# Patient Record
Sex: Male | Born: 1970
Health system: Southern US, Community
[De-identification: ages and names within clinical notes are randomized; demographics above are authoritative.]

## PROBLEM LIST (undated history)

## (undated) DIAGNOSIS — R0600 Dyspnea, unspecified: Secondary | ICD-10-CM

## (undated) DIAGNOSIS — D869 Sarcoidosis, unspecified: Secondary | ICD-10-CM

## (undated) DIAGNOSIS — J45909 Unspecified asthma, uncomplicated: Secondary | ICD-10-CM

## (undated) DIAGNOSIS — E119 Type 2 diabetes mellitus without complications: Secondary | ICD-10-CM

## (undated) DIAGNOSIS — M549 Dorsalgia, unspecified: Secondary | ICD-10-CM

## (undated) DIAGNOSIS — G8929 Other chronic pain: Secondary | ICD-10-CM

## (undated) DIAGNOSIS — M1990 Unspecified osteoarthritis, unspecified site: Secondary | ICD-10-CM

## (undated) DIAGNOSIS — K219 Gastro-esophageal reflux disease without esophagitis: Secondary | ICD-10-CM

## (undated) DIAGNOSIS — I10 Essential (primary) hypertension: Secondary | ICD-10-CM

## (undated) DIAGNOSIS — E785 Hyperlipidemia, unspecified: Secondary | ICD-10-CM

## (undated) HISTORY — DX: Essential (primary) hypertension: I10

## (undated) HISTORY — DX: Gastro-esophageal reflux disease without esophagitis: K21.9

## (undated) HISTORY — PX: BACK SURGERY: SHX140

## (undated) HISTORY — PX: DENTAL SURGERY: SHX609

## (undated) HISTORY — PX: COLONOSCOPY: SHX174

## (undated) HISTORY — DX: Unspecified osteoarthritis, unspecified site: M19.90

## (undated) HISTORY — DX: Hyperlipidemia, unspecified: E78.5

## (undated) HISTORY — PX: NO PAST SURGERIES: SHX2092

---

## 2005-09-28 ENCOUNTER — Emergency Department (HOSPITAL_COMMUNITY): Admission: EM | Admit: 2005-09-28 | Discharge: 2005-09-28 | Payer: Self-pay | Admitting: Emergency Medicine

## 2006-07-10 ENCOUNTER — Emergency Department (HOSPITAL_COMMUNITY): Admission: EM | Admit: 2006-07-10 | Discharge: 2006-07-10 | Payer: Self-pay | Admitting: Emergency Medicine

## 2006-11-25 ENCOUNTER — Emergency Department (HOSPITAL_COMMUNITY): Admission: EM | Admit: 2006-11-25 | Discharge: 2006-11-25 | Payer: Self-pay | Admitting: Family Medicine

## 2006-12-02 ENCOUNTER — Emergency Department (HOSPITAL_COMMUNITY): Admission: EM | Admit: 2006-12-02 | Discharge: 2006-12-02 | Payer: Self-pay | Admitting: Emergency Medicine

## 2008-07-05 ENCOUNTER — Emergency Department (HOSPITAL_COMMUNITY): Admission: EM | Admit: 2008-07-05 | Discharge: 2008-07-05 | Payer: Self-pay | Admitting: Family Medicine

## 2010-09-28 ENCOUNTER — Inpatient Hospital Stay (INDEPENDENT_AMBULATORY_CARE_PROVIDER_SITE_OTHER)
Admission: RE | Admit: 2010-09-28 | Discharge: 2010-09-28 | Disposition: A | Discharge status: Home / Self Care | Payer: Self-pay | Source: Ambulatory Visit | Attending: Family Medicine | Admitting: Family Medicine

## 2010-09-28 DIAGNOSIS — H6980 Other specified disorders of Eustachian tube, unspecified ear: Secondary | ICD-10-CM

## 2011-01-30 ENCOUNTER — Emergency Department (HOSPITAL_COMMUNITY): Payer: Medicaid Other

## 2011-01-30 ENCOUNTER — Emergency Department (HOSPITAL_COMMUNITY)
Admission: EM | Admit: 2011-01-30 | Discharge: 2011-01-30 | Disposition: A | Discharge status: Home / Self Care | Payer: Medicaid Other | Attending: Emergency Medicine | Admitting: Emergency Medicine

## 2011-01-30 ENCOUNTER — Encounter: Payer: Self-pay | Admitting: *Deleted

## 2011-01-30 DIAGNOSIS — J209 Acute bronchitis, unspecified: Secondary | ICD-10-CM | POA: Insufficient documentation

## 2011-01-30 DIAGNOSIS — R059 Cough, unspecified: Secondary | ICD-10-CM | POA: Insufficient documentation

## 2011-01-30 DIAGNOSIS — R05 Cough: Secondary | ICD-10-CM | POA: Insufficient documentation

## 2011-01-30 DIAGNOSIS — R509 Fever, unspecified: Secondary | ICD-10-CM | POA: Insufficient documentation

## 2011-01-30 MED ORDER — HYDROCOD POLST-CHLORPHEN POLST 10-8 MG/5ML PO LQCR: 10.0000 mL | Freq: Two times a day (BID) | ORAL | Status: DC | PRN

## 2011-01-30 MED ORDER — HYDROCOD POLST-CHLORPHEN POLST 10-8 MG/5ML PO LQCR
10.0000 mL | Freq: Once | ORAL | Status: AC
  Administered 2011-01-30: 10 mL via ORAL
  Filled 2011-01-30 (×2): qty 5

## 2011-01-30 MED ORDER — AZITHROMYCIN 250 MG PO TABS
500.0000 mg | ORAL_TABLET | Freq: Once | ORAL | Status: AC
  Administered 2011-01-30: 500 mg via ORAL
  Filled 2011-01-30: qty 2

## 2011-01-30 MED ORDER — AZITHROMYCIN 250 MG PO TABS: ORAL_TABLET | ORAL | Status: DC

## 2011-01-30 NOTE — ED Provider Notes (Signed)
History     CSN: [id number] Arrival date & time: 01/30/2011  6:51 PM   First MD Initiated Contact with Patient 01/30/11 2055      Chief Complaint  Patient presents with  . Influenza    (Consider location/radiation/quality/duration/timing/severity/associated sxs/prior treatment) HPI Comments: Pt had had a cough productive of green mucus and a subjective fever since yesterday.  Patient is a 40 y.o. male presenting with flu symptoms. The history is provided by the patient. No language interpreter was used.  Influenza The current episode started 2 days ago. The problem occurs constantly. The problem has been gradually worsening. The symptoms are aggravated by nothing. The symptoms are relieved by nothing (He has tried Mucinex, Nyquil, and Alka-Seltzer without relief. ). The treatment provided no relief.    History reviewed. No pertinent past medical history.  History reviewed. No pertinent past surgical history.  History reviewed. No pertinent family history.  History  Substance Use Topics  . Smoking status: Current Everyday Smoker  . Smokeless tobacco: Not on file  . Alcohol Use: No      Review of Systems  Constitutional: Positive for fever.  HENT: Negative.   Eyes: Negative.   Respiratory: Positive for cough.   Cardiovascular: Negative.   Gastrointestinal: Negative.   Genitourinary: Negative.   Musculoskeletal: Negative.   Skin: Negative.   Neurological: Negative.   Psychiatric/Behavioral: Negative.     Allergies  Review of patient's allergies indicates no known allergies.  Home Medications   Current Outpatient Rx  Name Route Sig Dispense Refill  . ACETAMINOPHEN 500 MG PO TABS Oral Take 500 mg by mouth every 6 (six) hours as needed. Pain relief     . ASPIRIN EFFERVESCENT 325 MG PO TBEF Oral Take 325 mg by mouth every 6 (six) hours as needed. Cough congestion     . GUAIFENESIN ER 600 MG PO TB12 Oral Take 1,200 mg by mouth 2 (two) times daily.      Marland Kitchen  PSEUDOEPHEDRINE-ACETAMINOPHEN 30-500 MG PO TABS Oral Take 1 tablet by mouth every 4 (four) hours as needed. For cold symptoms       BP 119/88  Pulse 116  Temp(Src) 99.5 F (37.5 C) (Oral)  Resp 22  Ht 5\' 8"  (1.727 m)  Wt 245 lb (111.131 kg)  BMI 37.25 kg/m2  SpO2 91%  Physical Exam  Nursing note and vitals reviewed. Constitutional: He is oriented to person, place, and time. He appears well-developed and well-nourished. Distressed: in mild distress with a deep cough.  HENT:  Head: Normocephalic and atraumatic.  Right Ear: External ear normal.  Left Ear: External ear normal.  Mouth/Throat: Oropharynx is clear and moist.  Eyes: Conjunctivae and EOM are normal. Pupils are equal, round, and reactive to light.  Neck: Normal range of motion. Neck supple.  Cardiovascular: Normal rate, regular rhythm and normal heart sounds.   Pulmonary/Chest: Effort normal.       Rhonchi at right base.  Abdominal: Soft. Bowel sounds are normal.  Musculoskeletal: Normal range of motion.  Neurological: He is alert and oriented to person, place, and time.       No sensory or motor deficit.    Skin: Skin is warm and dry.  Psychiatric: He has a normal mood and affect. His behavior is normal.    ED Course  Procedures (including critical care time)  9:11 PM Pt seen --> physical exam performed.  Has rhonchi at R base --> chest x-ray ordered.  10:04 PM Chest x-ray showed no pneumonia.  X-ray read as showing ? Apical adenopathy.  Discussed this with Dr Caryl Pina, radiologist, who advised that where pt's health is generally good could hold off on chest CT.   Will treat for acute bronchitis with Z-pak and Tussionex.   1. Acute bronchitis          Carleene Cooper III, MD 01/30/11 2210

## 2011-01-30 NOTE — ED Notes (Signed)
Patient returned from X-ray 

## 2011-01-30 NOTE — ED Notes (Signed)
Pt states he had been feeling bad for 3 weeks. Pt started to get worse with in the last week. Pt c/o body aches, productive cough.

## 2011-02-12 ENCOUNTER — Emergency Department (INDEPENDENT_AMBULATORY_CARE_PROVIDER_SITE_OTHER)
Admission: EM | Admit: 2011-02-12 | Discharge: 2011-02-12 | Disposition: A | Discharge status: Nursing Home (Includes ICF) | Payer: Self-pay | Source: Home / Self Care | Attending: Emergency Medicine | Admitting: Emergency Medicine

## 2011-02-12 ENCOUNTER — Emergency Department (INDEPENDENT_AMBULATORY_CARE_PROVIDER_SITE_OTHER): Payer: Self-pay

## 2011-02-12 ENCOUNTER — Encounter (HOSPITAL_COMMUNITY): Payer: Self-pay

## 2011-02-12 ENCOUNTER — Encounter (HOSPITAL_COMMUNITY): Payer: Self-pay | Admitting: Emergency Medicine

## 2011-02-12 ENCOUNTER — Emergency Department (HOSPITAL_COMMUNITY)
Admission: EM | Admit: 2011-02-12 | Discharge: 2011-02-13 | Disposition: A | Discharge status: Home / Self Care | Payer: Medicaid Other | Attending: Emergency Medicine | Admitting: Emergency Medicine

## 2011-02-12 DIAGNOSIS — R059 Cough, unspecified: Secondary | ICD-10-CM

## 2011-02-12 DIAGNOSIS — R0609 Other forms of dyspnea: Secondary | ICD-10-CM

## 2011-02-12 DIAGNOSIS — R0989 Other specified symptoms and signs involving the circulatory and respiratory systems: Secondary | ICD-10-CM

## 2011-02-12 DIAGNOSIS — R062 Wheezing: Secondary | ICD-10-CM | POA: Insufficient documentation

## 2011-02-12 DIAGNOSIS — R05 Cough: Secondary | ICD-10-CM

## 2011-02-12 DIAGNOSIS — R0602 Shortness of breath: Secondary | ICD-10-CM | POA: Insufficient documentation

## 2011-02-12 DIAGNOSIS — R590 Localized enlarged lymph nodes: Secondary | ICD-10-CM

## 2011-02-12 DIAGNOSIS — Z79899 Other long term (current) drug therapy: Secondary | ICD-10-CM | POA: Insufficient documentation

## 2011-02-12 DIAGNOSIS — R599 Enlarged lymph nodes, unspecified: Secondary | ICD-10-CM | POA: Insufficient documentation

## 2011-02-12 DIAGNOSIS — R0600 Dyspnea, unspecified: Secondary | ICD-10-CM

## 2011-02-12 NOTE — ED Notes (Signed)
Pt was seen at [facility] on 11-30 and given z pack and tussinex and continues to have productive cough.  Pt completed medication and states, "I can't walk across the floor without sob."

## 2011-02-12 NOTE — ED Notes (Addendum)
Pt stated that he went to [facility] (on 01/30/11) and they gave him cough medicine and abx, because he was having coughing and SOB. He was not getting any better so he went to the Urgent Care and they sent him to the ED. According to pts family member Urgent Care said the pt has scarring on his lungs and they want him to get follow up care/CT scan. Lung sounds and clear and diminished. No signs of respiratory distress. No CP. Will continue to monitor.

## 2011-02-12 NOTE — ED Notes (Signed)
Pt from [facility].  Reports sob and productive cough x 1 month.  Denies fever, nausea, vomiting, and chest pain.  Pt seen at [facility] 2 weeks ago and treated with antibiotics for bronchitis.  [facility] sent pt to ED for CT of chest.

## 2011-02-12 NOTE — ED Provider Notes (Signed)
History     CSN: [id number]  Arrival date & time 02/12/11  1806   First MD Initiated Contact with Patient 02/12/11 1903      Chief Complaint  Patient presents with  . Cough    (Consider location/radiation/quality/duration/timing/severity/associated sxs/prior treatment) HPI Comments: Pt with 1 mo SOB, "deep" cough productive of thick yellow sputum. States gets winded with short walks. Coughing worst at night, unable to sleep secondary to coughing. No N/V, fevers, unintentional weight loss, hemoptysis, CP, night sweats. No nasal congestion. Tried mulitple otc meds w/o relief- states went through several bottles of mucinex, nyquil.  tx'd for bronchitis 2 weeks ago with tussionex, zpak which pt states he finished but states he has gotten worse. Pt was smoker for 14 years and quit 2 weeks ago when dx'd with bronchitis. No h/o emphysema, asthma, COPD, sarcoidosis, lupus. No travel, does not work with prisoners, homeless. No h/o drug use.  Patient is a 40 y.o. male presenting with cough. The history is provided by the patient.  Cough This is a chronic problem. The current episode started more than 1 week ago. The cough is productive of sputum. There has been no fever. Associated symptoms include shortness of breath and wheezing. Pertinent negatives include no chest pain, no chills, no sweats, no headaches, no rhinorrhea, no sore throat and no myalgias. He is a smoker. His past medical history is significant for bronchitis. His past medical history does not include pneumonia, COPD, emphysema or asthma.    History reviewed. No pertinent past medical history.  History reviewed. No pertinent past surgical history.  History reviewed. No pertinent family history.  History  Substance Use Topics  . Smoking status: Former Games developer  . Smokeless tobacco: Former Neurosurgeon    Quit date: 01/13/2011  . Alcohol Use: No      Review of Systems  Constitutional: Positive for fatigue. Negative for fever, chills  and unexpected weight change.  HENT: Negative for sore throat and rhinorrhea.   Respiratory: Positive for cough, shortness of breath and wheezing.   Cardiovascular: Negative for chest pain.  Gastrointestinal: Negative for nausea and vomiting.  Musculoskeletal: Negative for myalgias.  Skin: Negative for rash.  Neurological: Negative for weakness and headaches.    Allergies  Review of patient's allergies indicates no known allergies.  Home Medications   Current Outpatient Rx  Name Route Sig Dispense Refill  . ACETAMINOPHEN 500 MG PO TABS Oral Take 500 mg by mouth every 6 (six) hours as needed. Pain relief     . AZITHROMYCIN 250 MG PO TABS  Take 1 tablet every day until finished. 4 tablet 0  . HYDROCOD POLST-CHLORPHEN POLST 10-8 MG/5ML PO LQCR Oral Take 10 mLs by mouth every 12 (twelve) hours as needed. 60 mL 0  . GUAIFENESIN ER 600 MG PO TB12 Oral Take 1,200 mg by mouth 2 (two) times daily.      Marland Kitchen PSEUDOEPHEDRINE-ACETAMINOPHEN 30-500 MG PO TABS Oral Take 1 tablet by mouth every 4 (four) hours as needed. For cold symptoms     . ASPIRIN EFFERVESCENT 325 MG PO TBEF Oral Take 325 mg by mouth every 6 (six) hours as needed. Cough congestion       BP 126/81  Pulse 94  Temp(Src) 97.8 F (36.6 C) (Oral)  Resp 20  SpO2 97%  Physical Exam  Nursing note and vitals reviewed. Constitutional: He is oriented to person, place, and time. He appears well-developed and well-nourished.  HENT:  Head: Normocephalic and atraumatic.  Eyes: Conjunctivae and  EOM are normal. Pupils are equal, round, and reactive to light.  Neck: Normal range of motion. Neck supple.  Cardiovascular: Normal rate, regular rhythm and normal heart sounds.   Pulses:      Radial pulses are 2+ on the right side, and 2+ on the left side.       Posterior tibial pulses are 2+ on the right side, and 2+ on the left side.  Pulmonary/Chest: Effort normal. No respiratory distress. He has wheezes. He has no rales. He exhibits no  tenderness.  Abdominal: He exhibits no distension.  Musculoskeletal: Normal range of motion. He exhibits no edema and no tenderness.  Neurological: He is alert and oriented to person, place, and time.  Skin: Skin is warm and dry.  Psychiatric: He has a normal mood and affect. His behavior is normal.    ED Course  Procedures (including critical care time)  Labs Reviewed - No data to display Dg Chest 2 View  02/12/2011  *RADIOLOGY REPORT*  Clinical Data: Intermittent cough for 1 month.  Smoker.  CHEST - 2 VIEW 02/12/2011:  Comparison: Two-view chest x-ray 01/30/2011.  Findings: Cardiac silhouette normal in size.  Bilateral hilar prominence, likely lymphadenopathy, unchanged.  Subcarinal lymphadenopathy also suspected on the lateral image.  Interval improvement in the interstitial opacities throughout both lungs on the prior examination.  Stable linear scarring in the left upper lobe.  Residual interstitial prominence in the lower lobes with low lung volumes.  Stable elevation of the right hemidiaphragm.  No pleural effusions.  Visualized bony thorax intact.  IMPRESSION: Bilateral hilar lymphadenopathy and subcarinal mediastinal lymphadenopathy.  Interstitial opacities in the lower lobes with associated low lung volumes, consistent with early fibrosis.  Is there a history of sarcoidosis?  Original Report Authenticated By: Arnell Sieving, M.D.     No diagnosis found.    MDM  Previous chart, labs, imaging reviewed.    Pt with 1 mo SOB, productive cough. No N/V, fevers, unintentional weight loss, hemoptysis, CP, night sweats. No RF for TB. tx'd for bronchitis 2 weeks ago with tussionex, zpak with no improvement. Repeat XR shows peristent perihilar LN and subcarinal LN. Has occ wheezing on exam today. satting well. Pt was smoker for 14 years. No h/o emphysema, asthma, COPD, sarcoidosis, lupus. Pt has no insurance, no follow up, no PMD. Concern with the persistent lymphadenopathy. Transferring  for CT.   Luiz Blare, MD 02/12/11 2115

## 2011-02-13 ENCOUNTER — Encounter (HOSPITAL_COMMUNITY): Payer: Self-pay | Admitting: Radiology

## 2011-02-13 ENCOUNTER — Emergency Department (HOSPITAL_COMMUNITY): Payer: Medicaid Other

## 2011-02-13 ENCOUNTER — Other Ambulatory Visit: Payer: Self-pay

## 2011-02-13 LAB — CBC
HCT: 36.9 % — ABNORMAL LOW (ref 39.0–52.0)
Hemoglobin: 12.9 g/dL — ABNORMAL LOW (ref 13.0–17.0)
MCH: 29.9 pg (ref 26.0–34.0)
MCHC: 35 g/dL (ref 30.0–36.0)
MCV: 85.4 fL (ref 78.0–100.0)
Platelets: 388 10*3/uL (ref 150–400)
RBC: 4.32 MIL/uL (ref 4.22–5.81)
RDW: 12.7 % (ref 11.5–15.5)
WBC: 7.4 10*3/uL (ref 4.0–10.5)

## 2011-02-13 LAB — POCT I-STAT, CHEM 8
BUN: 11 mg/dL (ref 6–23)
Calcium, Ion: 1.15 mmol/L (ref 1.12–1.32)
Chloride: 102 mEq/L (ref 96–112)
Creatinine, Ser: 0.7 mg/dL (ref 0.50–1.35)
Glucose, Bld: 109 mg/dL — ABNORMAL HIGH (ref 70–99)
HCT: 41 % (ref 39.0–52.0)
Hemoglobin: 13.9 g/dL (ref 13.0–17.0)
Potassium: 3.4 mEq/L — ABNORMAL LOW (ref 3.5–5.1)
Sodium: 142 mEq/L (ref 135–145)
TCO2: 30 mmol/L (ref 0–100)

## 2011-02-13 LAB — DIFFERENTIAL
Basophils Absolute: 0 10*3/uL (ref 0.0–0.1)
Basophils Relative: 0 % (ref 0–1)
Eosinophils Absolute: 0.2 10*3/uL (ref 0.0–0.7)
Eosinophils Relative: 3 % (ref 0–5)
Lymphocytes Relative: 20 % (ref 12–46)
Lymphs Abs: 1.4 10*3/uL (ref 0.7–4.0)
Monocytes Absolute: 0.6 10*3/uL (ref 0.1–1.0)
Monocytes Relative: 8 % (ref 3–12)
Neutro Abs: 5.1 10*3/uL (ref 1.7–7.7)
Neutrophils Relative %: 69 % (ref 43–77)

## 2011-02-13 LAB — POCT I-STAT TROPONIN I: Troponin i, poc: 0 ng/mL (ref 0.00–0.08)

## 2011-02-13 LAB — COMPREHENSIVE METABOLIC PANEL
ALT: 25 U/L (ref 0–53)
AST: 32 U/L (ref 0–37)
Albumin: 3.7 g/dL (ref 3.5–5.2)
Alkaline Phosphatase: 101 U/L (ref 39–117)
BUN: 11 mg/dL (ref 6–23)
CO2: 28 mEq/L (ref 19–32)
Calcium: 9.2 mg/dL (ref 8.4–10.5)
Chloride: 102 mEq/L (ref 96–112)
Creatinine, Ser: 0.62 mg/dL (ref 0.50–1.35)
GFR calc Af Amer: >90 mL/min (ref >90)
GFR calc non Af Amer: >90 mL/min (ref >90)
Glucose, Bld: 111 mg/dL — ABNORMAL HIGH (ref 70–99)
Potassium: 3.4 mEq/L — ABNORMAL LOW (ref 3.5–5.1)
Sodium: 138 mEq/L (ref 135–145)
Total Bilirubin: 0.6 mg/dL (ref 0.3–1.2)
Total Protein: 8 g/dL (ref 6.0–8.3)

## 2011-02-13 LAB — D-DIMER, QUANTITATIVE: D-Dimer, Quant: 0.99 ug/mL-FEU — ABNORMAL HIGH (ref 0.00–0.48)

## 2011-02-13 MED ORDER — IOHEXOL 300 MG/ML  SOLN
80.0000 mL | Freq: Once | INTRAMUSCULAR | Status: AC | PRN
  Administered 2011-02-13: 80 mL via INTRAVENOUS

## 2011-02-13 MED ORDER — IOHEXOL 350 MG/ML SOLN
85.0000 mL | Freq: Once | INTRAVENOUS | Status: AC | PRN
  Administered 2011-02-13: 85 mL via INTRAVENOUS

## 2011-02-13 NOTE — ED Provider Notes (Signed)
Patient seen and examined. Patient was signed out to me by Dr. Hyacinth Meeker. Patient informed of his results and he will be given referral to pulmonology on-call. Suspect the patient does have sarcoidosis as opposed to lymphoma. Patient has been counseled about this  Toy Baker, MD 02/13/11 1006

## 2011-02-13 NOTE — ED Provider Notes (Signed)
History     CSN: [id number]  Arrival date & time 02/12/11  2118   First MD Initiated Contact with Patient 02/12/11 2340      Chief Complaint  Patient presents with  . Shortness of Breath    (Consider location/radiation/quality/duration/timing/severity/associated sxs/prior treatment) HPI Comments: Patient presents with one month of shortness of breath, worse with activity. He also has a cough. Patient was a previous smoker but quit one month ago. Patient was seen about 2 weeks ago and treated for acute bronchitis. He did not improve with antibiotics. He was seen again today for a recheck at urgent care and was sent to the emergency department for a chest CT scan. Patient has cough but no fever, no other upper respiratory tract infection symptoms. Patient denies nausea, vomiting, diarrhea.  Patient is a 40 y.o. male presenting with shortness of breath. The history is provided by the patient.  Shortness of Breath  The current episode started more than 2 weeks ago. The onset was gradual. The problem has been unchanged. The symptoms are relieved by nothing. The symptoms are aggravated by activity. Associated symptoms include cough, shortness of breath and wheezing. Pertinent negatives include no chest pain, no chest pressure, no fever, no rhinorrhea and no sore throat.    History reviewed. No pertinent past medical history.  History reviewed. No pertinent past surgical history.  No family history on file.  History  Substance Use Topics  . Smoking status: Former Games developer  . Smokeless tobacco: Former Neurosurgeon    Quit date: 01/13/2011  . Alcohol Use: No      Review of Systems  Constitutional: Negative for fever, chills and unexpected weight change.  HENT: Negative for sore throat and rhinorrhea.   Eyes: Negative for redness.  Respiratory: Positive for cough, shortness of breath and wheezing.   Cardiovascular: Negative for chest pain.  Gastrointestinal: Negative for nausea, vomiting,  abdominal pain, diarrhea and constipation.  Genitourinary: Negative for dysuria.  Musculoskeletal: Negative for myalgias.  Skin: Negative for rash.  Neurological: Negative for dizziness and headaches.    Allergies  Review of patient's allergies indicates no known allergies.  Home Medications   Current Outpatient Rx  Name Route Sig Dispense Refill  . ACETAMINOPHEN 500 MG PO TABS Oral Take 500 mg by mouth every 6 (six) hours as needed. Pain relief     . ASPIRIN EFFERVESCENT 325 MG PO TBEF Oral Take 325 mg by mouth every 6 (six) hours as needed. Cough congestion     . GUAIFENESIN ER 600 MG PO TB12 Oral Take 1,200 mg by mouth 2 (two) times daily.        BP 112/74  Pulse 82  Temp(Src) 97.9 F (36.6 C) (Oral)  Resp 18  SpO2 95%  Physical Exam  Nursing note and vitals reviewed. Constitutional: He is oriented to person, place, and time. He appears well-developed and well-nourished.  HENT:  Head: Normocephalic and atraumatic.  Eyes: Conjunctivae are normal. Right eye exhibits no discharge. Left eye exhibits no discharge.  Neck: Normal range of motion. Neck supple. No JVD present.  Cardiovascular: Normal rate, regular rhythm and normal heart sounds.   Pulmonary/Chest: Effort normal and breath sounds normal. No respiratory distress. He has no wheezes.  Abdominal: Soft. Bowel sounds are normal. There is no tenderness. There is no rebound and no guarding.  Musculoskeletal: He exhibits no edema.  Neurological: He is alert and oriented to person, place, and time.  Skin: Skin is warm and dry.  Psychiatric: He has  a normal mood and affect.    ED Course  Procedures (including critical care time)  Labs Reviewed  POCT I-STAT, CHEM 8 - Abnormal; Notable for the following:    Potassium 3.4 (*)    Glucose, Bld 109 (*)    All other components within normal limits  CBC - Abnormal; Notable for the following:    Hemoglobin 12.9 (*)    HCT 36.9 (*)    All other components within normal  limits  COMPREHENSIVE METABOLIC PANEL - Abnormal; Notable for the following:    Potassium 3.4 (*)    Glucose, Bld 111 (*)    All other components within normal limits  D-DIMER, QUANTITATIVE - Abnormal; Notable for the following:    D-Dimer, Quant 0.99 (*)    All other components within normal limits  DIFFERENTIAL  POCT I-STAT TROPONIN I  I-STAT, CHEM 8  I-STAT TROPONIN I   Dg Chest 2 View  02/12/2011  *RADIOLOGY REPORT*  Clinical Data: Intermittent cough for 1 month.  Smoker.  CHEST - 2 VIEW 02/12/2011:  Comparison: Two-view chest x-ray 01/30/2011.  Findings: Cardiac silhouette normal in size.  Bilateral hilar prominence, likely lymphadenopathy, unchanged.  Subcarinal lymphadenopathy also suspected on the lateral image.  Interval improvement in the interstitial opacities throughout both lungs on the prior examination.  Stable linear scarring in the left upper lobe.  Residual interstitial prominence in the lower lobes with low lung volumes.  Stable elevation of the right hemidiaphragm.  No pleural effusions.  Visualized bony thorax intact.  IMPRESSION: Bilateral hilar lymphadenopathy and subcarinal mediastinal lymphadenopathy.  Interstitial opacities in the lower lobes with associated low lung volumes, consistent with early fibrosis.  Is there a history of sarcoidosis?  Original Report Authenticated By: Arnell Sieving, M.D.   Ct Chest W Contrast  02/13/2011  *RADIOLOGY REPORT*  Clinical Data: Shortness of breath.  Lymphadenopathy on chest film. History of bronchitis.  CT CHEST WITH CONTRAST  Technique:  Multidetector CT imaging of the chest was performed following the standard protocol during bolus administration of intravenous contrast.  Contrast: 80mL OMNIPAQUE IOHEXOL 300 MG/ML IV SOLN  Comparison: Chest radiograph 02/12/2011  Findings: Diffuse lymphadenopathy involving bilateral supraclavicular, anterior mediastinal, aortopulmonic window, right paratracheal, pretracheal, subcarinal,  bilateral hilar, and azygos esophageal recess region.  No significant axillary lymphadenopathy. Changes are suspicious for lymphoma.  Metastatic or inflammatory etiologies would be less likely.  There are patchy infiltrative changes scattered throughout both lungs with some fibrosis and scarring present.  These changes could represent a patchy pneumonia although lymphoma or neoplastic infiltration can also potentially have this appearance.  The airways appear patent.  Bilateral apical scarring and emphysematous changes.  No dominant parenchymal mass lesion identified.  Visualized upper abdominal images suggest prominent lymph nodes in the celiac axis.  IMPRESSION: Diffuse mediastinal and hilar lymphadenopathy worrisome for lymphoma.  Patchy air space and tree in bud infiltrates in both lungs likely to represent infectious change although a neoplastic or inflammatory processes are also possible.  Original Report Authenticated By: Marlon Pel, M.D.     1. Shortness of breath   2. Mediastinal lymphadenopathy     12:25 AM patient seen and examined. Previous notes reviewed. I have spoken with the radiologist recommends CT of chest with contrast material. Tests ordered.  12:25 AM Patient was discussed with Vida Roller, MD  6:03 AM patient received chest PT overnight. Findings discussed with the patient. Further workup undertaken. Patient will remain in the CDU this morning for a VQ  scan to rule out pulmonary embolism and arrange followup for workup of his CT findings. Patient maintained 98% pulse ox during ambulation.  6:11 AM patient informed of scan. Plan: VQ scan, hematology/oncology consult to arrange followup.   MDM  CT findings shows lymphadenopathy, cannot rule out lymphoma. Workup pending.       Carolee Rota, [place] 02/13/11 4098  Carolee Rota, PA 02/13/11 [phone]

## 2011-02-13 NOTE — ED Notes (Signed)
Pt ambulated with steady gait with pulse ox reading at 98% with HR 110.

## 2011-02-14 NOTE — ED Provider Notes (Signed)
Medical screening examination/treatment/procedure(s) were performed by non-physician practitioner and as supervising physician I was immediately available for consultation/collaboration.   Vida Roller, MD 02/14/11 [phone]

## 2011-02-17 ENCOUNTER — Encounter (HOSPITAL_COMMUNITY): Payer: Self-pay | Admitting: *Deleted

## 2011-02-17 ENCOUNTER — Emergency Department (HOSPITAL_COMMUNITY)
Admission: EM | Admit: 2011-02-17 | Discharge: 2011-02-18 | Disposition: A | Discharge status: Home / Self Care | Payer: Self-pay | Attending: Emergency Medicine | Admitting: Emergency Medicine

## 2011-02-17 DIAGNOSIS — R0609 Other forms of dyspnea: Secondary | ICD-10-CM | POA: Insufficient documentation

## 2011-02-17 DIAGNOSIS — R0602 Shortness of breath: Secondary | ICD-10-CM | POA: Insufficient documentation

## 2011-02-17 DIAGNOSIS — R0989 Other specified symptoms and signs involving the circulatory and respiratory systems: Secondary | ICD-10-CM | POA: Insufficient documentation

## 2011-02-17 DIAGNOSIS — R05 Cough: Secondary | ICD-10-CM | POA: Insufficient documentation

## 2011-02-17 DIAGNOSIS — R059 Cough, unspecified: Secondary | ICD-10-CM | POA: Insufficient documentation

## 2011-02-17 DIAGNOSIS — R062 Wheezing: Secondary | ICD-10-CM | POA: Insufficient documentation

## 2011-02-17 DIAGNOSIS — R0789 Other chest pain: Secondary | ICD-10-CM | POA: Insufficient documentation

## 2011-02-18 MED ORDER — IPRATROPIUM BROMIDE 0.02 % IN SOLN
0.5000 mg | Freq: Once | RESPIRATORY_TRACT | Status: AC
  Administered 2011-02-18: 0.5 mg via RESPIRATORY_TRACT
  Filled 2011-02-18: qty 2.5

## 2011-02-18 MED ORDER — ALBUTEROL SULFATE HFA 108 (90 BASE) MCG/ACT IN AERS
2.0000 | INHALATION_SPRAY | Freq: Four times a day (QID) | RESPIRATORY_TRACT | Status: DC
  Administered 2011-02-18: 2 via RESPIRATORY_TRACT
  Filled 2011-02-18: qty 6.7

## 2011-02-18 MED ORDER — AEROCHAMBER MAX W/MASK MEDIUM MISC
1.0000 | Freq: Once | Status: AC
  Administered 2011-02-18: 1
  Filled 2011-02-18: qty 1

## 2011-02-18 MED ORDER — ALBUTEROL SULFATE (5 MG/ML) 0.5% IN NEBU
5.0000 mg | INHALATION_SOLUTION | Freq: Once | RESPIRATORY_TRACT | Status: AC
  Administered 2011-02-18: 5 mg via RESPIRATORY_TRACT
  Filled 2011-02-18: qty 1

## 2011-02-18 MED ORDER — HYDROCODONE-HOMATROPINE 5-1.5 MG/5ML PO SYRP: 5.0000 mL | ORAL_SOLUTION | Freq: Four times a day (QID) | ORAL | Status: AC | PRN

## 2011-02-18 NOTE — ED Provider Notes (Signed)
History     CSN: [id number]  Arrival date & time 02/17/11  1928   First MD Initiated Contact with Patient 02/17/11 2353      Chief Complaint  Patient presents with  . Shortness of Breath    Pt reports recent diagnosis with bronchitis with referral to lung specialist. pt reports SOB with activity. pt states "I just want to see if I can get prescribed a breathing treatment to help with SOB."    (Consider location/radiation/quality/duration/timing/severity/associated sxs/prior treatment) HPI Comments: Pt states he has been evaluated recently for his bronchitis & cough. Pt was dx with pulmonary lymphadenopathy via CT and has a follow up apt scheduled with pulmonology. He has completed his course of abx, but states his chest feels tight and he is having difficulty taking deep breaths. He denies CP, leg swelling, hemoptysis.   The history is provided by the patient.    History reviewed. No pertinent past medical history.  History reviewed. No pertinent past surgical history.  History reviewed. No pertinent family history.  History  Substance Use Topics  . Smoking status: Former Games developer  . Smokeless tobacco: Former Neurosurgeon    Quit date: 01/13/2011  . Alcohol Use: No      Review of Systems  Constitutional: Negative for fever, chills and fatigue.  HENT: Negative for congestion, sore throat, facial swelling, neck pain and neck stiffness.   Eyes: Negative for visual disturbance.  Respiratory: Positive for cough, chest tightness and shortness of breath. Negative for apnea, choking, wheezing and stridor.   Cardiovascular: Negative for chest pain, palpitations and leg swelling.  Gastrointestinal: Negative for nausea and vomiting.  Skin: Negative for pallor, rash and wound.  Psychiatric/Behavioral: Negative for confusion.  All other systems reviewed and are negative.    Allergies  Review of patient's allergies indicates no known allergies.  Home Medications   Current Outpatient Rx    Name Route Sig Dispense Refill  . ACETAMINOPHEN 500 MG PO TABS Oral Take 500 mg by mouth every 6 (six) hours as needed. Pain relief     . GUAIFENESIN ER 600 MG PO TB12 Oral Take 1,200 mg by mouth 2 (two) times daily.        BP 123/86  Pulse 95  Temp(Src) 98.1 F (36.7 C) (Oral)  Resp 22  SpO2 100%  Physical Exam  Nursing note and vitals reviewed. Constitutional: He is oriented to person, place, and time. He appears well-developed and well-nourished. No distress.  HENT:  Head: No trismus in the jaw.  Right Ear: Tympanic membrane, external ear and ear canal normal.  Left Ear: Tympanic membrane, external ear and ear canal normal.  Mouth/Throat: Uvula is midline and mucous membranes are normal. No oropharyngeal exudate, posterior oropharyngeal edema or posterior oropharyngeal erythema.  Eyes:       Normal appearance  Neck: Normal range of motion. Neck supple.  Cardiovascular: Normal rate and regular rhythm.   Pulmonary/Chest: Effort normal. No respiratory distress. He has wheezes. He has no rales.  Lymphadenopathy:    He has no cervical adenopathy.  Neurological: He is alert and oriented to person, place, and time.  Skin: Skin is warm and dry. No rash noted.  Psychiatric: He has a normal mood and affect. His behavior is normal.    ED Course  Procedures (including critical care time)  Labs Reviewed - No data to display No results found.   No diagnosis found.  Pt states he is breathing much better after nebulizer tx. He wil be dc  w spacer and albuterol inhaler. Pt has close f-u already scheduled w pulmonology. Pt re-evaluated prior to dc & is in NAD, hemodynamically stable, able to complete full sentences and has no current complaints. Pt is agreeable w plan  MDM  Chest tightness, SOB  Bronchitis  Mediastinal lymphadenopathy           [place], [place] 02/18/11 0113

## 2011-02-18 NOTE — ED Provider Notes (Signed)
Medical screening examination/treatment/procedure(s) were performed by non-physician practitioner and as supervising physician I was immediately available for consultation/collaboration.  Khush Pasion, MD 02/18/11 1540 

## 2011-02-18 NOTE — ED Notes (Signed)
Instructions given for inhaler and spacer. The patient states understanding

## 2011-03-06 ENCOUNTER — Encounter: Payer: Self-pay | Admitting: Internal Medicine

## 2011-03-06 ENCOUNTER — Ambulatory Visit (INDEPENDENT_AMBULATORY_CARE_PROVIDER_SITE_OTHER): Payer: Self-pay | Admitting: Internal Medicine

## 2011-03-06 VITALS — BP 120/72 | HR 114 | Temp 97.9°F | Ht 68.0 in | Wt 220.2 lb

## 2011-03-06 DIAGNOSIS — D860 Sarcoidosis of lung: Secondary | ICD-10-CM | POA: Insufficient documentation

## 2011-03-06 DIAGNOSIS — D869 Sarcoidosis, unspecified: Secondary | ICD-10-CM

## 2011-03-06 MED ORDER — AMOXICILLIN-POT CLAVULANATE 875-125 MG PO TABS: 1.0000 | ORAL_TABLET | Freq: Two times a day (BID) | ORAL | Status: AC

## 2011-03-06 MED ORDER — PREDNISONE (PAK) 10 MG PO TABS: ORAL_TABLET | ORAL | Status: AC

## 2011-03-06 NOTE — Patient Instructions (Addendum)
Sarcoidosis is a benign inflammatory condition caused by  The  immune system being too revved up like a thermostat on your furnace that's partially  stuck causing arthitis, rash, short of breath and cough and vision issues.  It typically burns itself out in 75% of patients by the end of 3 years with little to indicate that we really change the natural course of the disease by aggressive treatments intended to alter it.  Treatment is generally reserved for patients with major symptoms we can attribute to sarcoid or if a vital organ (like the eye or kidney or nervous system) becomes affected.    Augmentin 875 mg one twice daily x 10 daily  Prednisone 10 mg 2 daily until better then one daily until return  Please schedule a follow up office visit in 4 weeks, sooner if needed

## 2011-03-06 NOTE — Assessment & Plan Note (Signed)
Almost certainly sarcoid and he has no medical insurance for bx  Discussed in detail all the  indications, usual  risks and alternatives  relative to the benefits with patient who agrees to proceed with steroid trial then regroup in one month  The goal with a chronic steroid dependent illness is always arriving at the lowest effective dose that controls the disease/symptoms and not accepting a set "formula" which is based on statistics or guidelines that don't always take into account patient  variability or the natural hx of the dz in every individual patient, which may well vary over time.  For now therefore I recommend the patient maintain  Ceiling of 20 mg per day and floor of 10 mg

## 2011-03-06 NOTE — Progress Notes (Signed)
  Subjective:    Patient ID: Nathan [name], male    DOB: December 27, 1970, 41 y.o.   MRN: [id number]  HPI  70 yobm quit smoking Nov 2012 due to cough since Aug 2011 but worse since Nov 2012 did not get much better when stopped so eval in [facility] ER with cxr and rx with abx and cough meds then UC leading to CT Scans > referred 03/06/2011 to Pulmonary Clinic  03/06/2011 1st pulmonary eval daily cough since Aug 2011 with urge to clear throat with green thick mucus worst at 8 each evening but present 24 hours per day, partly responded to abx, not on steroids yet .  Sob with more than slow adls.  No rash, ocular or articular symptoms   Also denies any obvious fluctuation of symptoms with weather or environmental changes or other aggravating or alleviating factors except as outlined above   Review of Systems  Constitutional: Negative for fever, chills, activity change, appetite change and unexpected weight change.  HENT: Negative for congestion, sore throat, rhinorrhea, sneezing, trouble swallowing, dental problem, voice change and postnasal drip.   Eyes: Negative for visual disturbance.  Respiratory: Positive for cough and shortness of breath. Negative for choking.   Cardiovascular: Negative for chest pain and leg swelling.  Gastrointestinal: Negative for nausea, vomiting and abdominal pain.  Genitourinary: Negative for difficulty urinating.  Musculoskeletal: Negative for arthralgias.  Skin: Negative for rash.  Psychiatric/Behavioral: Negative for behavioral problems and confusion.       Objective:   Physical Exam  amb bm nad Wt 220 03/06/2011 HEENT: nl dentition, turbinates, and orophanx. Nl external ear canals without cough reflex   NECK :  without JVD/Nodes/TM/ nl carotid upstrokes bilaterally   LUNGS: no acc muscle use, clear to A and P bilaterally without cough on insp or exp maneuvers   CV:  RRR  no s3 or murmur or increase in P2, no edema   ABD:  soft and nontender with nl excursion in  the supine position. No bruits or organomegaly, bowel sounds nl  MS:  warm without deformities, calf tenderness, cyanosis or clubbing  SKIN: warm and dry without lesions    NEURO:  alert, approp, no deficits    cxr 02/12/11 Bilateral hilar lymphadenopathy and subcarinal mediastinal  lymphadenopathy. Interstitial opacities in the lower lobes with  associated low lung volumes, consistent with early fibrosis   My review:  Classic doughnut sign on lateral view     Assessment & Plan:

## 2011-04-03 ENCOUNTER — Encounter: Payer: Self-pay | Admitting: Internal Medicine

## 2011-04-03 ENCOUNTER — Ambulatory Visit (INDEPENDENT_AMBULATORY_CARE_PROVIDER_SITE_OTHER): Payer: Self-pay | Admitting: Internal Medicine

## 2011-04-03 VITALS — BP 124/76 | HR 90 | Temp 98.1°F | Ht 68.0 in | Wt 228.0 lb

## 2011-04-03 DIAGNOSIS — D869 Sarcoidosis, unspecified: Secondary | ICD-10-CM

## 2011-04-03 MED ORDER — PREDNISONE 10 MG PO TABS: 10.0000 mg | ORAL_TABLET | Freq: Every day | ORAL | Status: DC

## 2011-04-03 NOTE — Patient Instructions (Signed)
Continue prednisone 10 mg daily with breakfast  Please schedule a follow up office visit in 6 weeks, call sooner if needed with cxr on return

## 2011-04-03 NOTE — Progress Notes (Signed)
  Subjective:    Patient ID: Nathan [name], male    DOB: 07-Aug-1970 .   MRN: [id number]   Brief patient profile:  40 yobm with clinical dx of Stage I sarcoid ? Onset fall of 2012 rx with pred since 03/06/11 s bx due to persistent cough.  HPI  quit smoking Nov 2012 due to cough since Aug 2011 but worse since Nov 2012 did not get much better when stopped so eval in [facility] ER with cxr and rx with abx and cough meds then UC leading to CT Scans > referred 03/06/2011 to Pulmonary Clinic  03/06/2011 1st pulmonary eval daily cough since Aug 2011 with urge to clear throat with green thick mucus worst at 8 each evening but present 24 hours per day, partly responded to abx, not on steroids yet .  Sob with more than slow adls.  No rash, ocular or articular symptoms rec Augmentin 875 mg one twice daily x 10 daily  Prednisone 10 mg 2 daily until better then one daily until return  04/03/2011 f/u ov/Gwenetta Devos cc better sinuses, no night cough, some day cough on 10 mg per day. No ocular articular symptoms, sob or rash.   Also denies any obvious fluctuation of symptoms with weather or environmental changes or other aggravating or alleviating factors except as outlined above         Objective:   Physical Exam  amb bm nad  Wt 220 03/06/2011 > 04/03/2011  228  HEENT: nl dentition, turbinates, and orophanx. Nl external ear canals without cough reflex   NECK :  without JVD/Nodes/TM/ nl carotid upstrokes bilaterally   LUNGS: no acc muscle use, clear to A and P bilaterally without cough on insp or exp maneuvers   CV:  RRR  no s3 or murmur or increase in P2, no edema   ABD:  soft and nontender with nl excursion in the supine position. No bruits or organomegaly, bowel sounds nl  MS:  warm without deformities, calf tenderness, cyanosis or clubbing  SKIN:  No rash  cxr 02/12/11 Bilateral hilar lymphadenopathy and subcarinal mediastinal  lymphadenopathy. Interstitial opacities in the lower lobes with    associated low lung volumes, consistent with early fibrosis   My review:  Classic doughnut sign on lateral view     Assessment & Plan:

## 2011-04-04 ENCOUNTER — Emergency Department (HOSPITAL_COMMUNITY)
Admission: EM | Admit: 2011-04-04 | Discharge: 2011-04-04 | Disposition: A | Discharge status: Home / Self Care | Payer: Self-pay | Attending: Emergency Medicine | Admitting: Emergency Medicine

## 2011-04-04 ENCOUNTER — Encounter (HOSPITAL_COMMUNITY): Payer: Self-pay | Admitting: Emergency Medicine

## 2011-04-04 DIAGNOSIS — L03317 Cellulitis of buttock: Secondary | ICD-10-CM | POA: Insufficient documentation

## 2011-04-04 DIAGNOSIS — L0231 Cutaneous abscess of buttock: Secondary | ICD-10-CM | POA: Insufficient documentation

## 2011-04-04 DIAGNOSIS — IMO0001 Reserved for inherently not codable concepts without codable children: Secondary | ICD-10-CM | POA: Insufficient documentation

## 2011-04-04 MED ORDER — SULFAMETHOXAZOLE-TRIMETHOPRIM 800-160 MG PO TABS: 1.0000 | ORAL_TABLET | Freq: Two times a day (BID) | ORAL | Status: AC

## 2011-04-04 MED ORDER — CEPHALEXIN 500 MG PO CAPS: 500.0000 mg | ORAL_CAPSULE | Freq: Four times a day (QID) | ORAL | Status: AC

## 2011-04-04 MED ORDER — CEPHALEXIN 500 MG PO CAPS: 500.0000 mg | ORAL_CAPSULE | Freq: Four times a day (QID) | ORAL | Status: DC

## 2011-04-04 NOTE — ED Notes (Signed)
Pt states he noticed bumps on buttocks "some day last week".  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  Several 3cm scaled areas with red ulcerated centers noted midline.

## 2011-04-04 NOTE — Discharge Instructions (Signed)

## 2011-04-04 NOTE — ED Provider Notes (Signed)
History     CSN: [id number]  Arrival date & time 04/04/11  0825   First MD Initiated Contact with Patient 04/04/11 0832     9:17 AM HPI Patient is here due to lesions on bilateral buttocks. Reports for the last week lesions have been increasing in size. Reports painful to sit. States lesions have begun to drain. Denies similar symptoms in the past. Patient is a 41 y.o. male presenting with abscess. The history is provided by the patient.  Abscess  This is a new problem. The current episode started more than one week ago. The onset was gradual. The problem occurs continuously. The problem has been gradually worsening. The abscess is present on the left buttock and right buttock. The problem is mild. The abscess is characterized by draining and painfulness. Pertinent negatives include no fever and no vomiting.    History reviewed. No pertinent past medical history.  History reviewed. No pertinent past surgical history.  Family History  Problem Relation Age of Onset  . Heart disease Maternal Grandfather   . Heart disease Paternal Uncle   . Prostate cancer Paternal Uncle     History  Substance Use Topics  . Smoking status: Former Smoker -- 0.8 packs/day for 20 years    Types: Cigarettes    Quit date: 01/13/2011  . Smokeless tobacco: Never Used  . Alcohol Use: No      Review of Systems  Constitutional: Negative for fever and chills.  Gastrointestinal: Negative for nausea and vomiting.  Skin: Positive for wound.       Abscesses  All other systems reviewed and are negative.    Allergies  Review of patient's allergies indicates no known allergies.  Home Medications   Current Outpatient Rx  Name Route Sig Dispense Refill  . ACETAMINOPHEN 500 MG PO TABS Oral Take 500 mg by mouth every 6 (six) hours as needed. Pain relief     . PREDNISONE 10 MG PO TABS Oral Take 1 tablet (10 mg total) by mouth daily. 100 tablet 0    BP 142/90  Pulse 91  Temp(Src) 97.7 F (36.5 C)  (Oral)  Resp 18  SpO2 97%  Physical Exam  Constitutional: He is oriented to person, place, and time. He appears well-developed and well-nourished.  HENT:  Head: Normocephalic and atraumatic.  Eyes: Pupils are equal, round, and reactive to light.  Neurological: He is alert and oriented to person, place, and time.  Skin: Skin is warm and dry. No rash noted. No erythema. No pallor.       Right and left buttock does have 2 medial abscesses. Approximately 1-2 cm in diameter. Not erythematous. Indurated but not fluctuant. All abscesses are draining a purulent fluid.   Psychiatric: He has a normal mood and affect. His behavior is normal.    ED Course  Procedures    MDM   -Obtained abscess culture. Based patient on Keflex and Bactrim patient follows up with Dr. Sharee Pimple for sarcoidosis. Advised patient should abscess is worsened to return to the emergency department for I&D drainage. Abscesses are currently draining a small amount but I do not feel further I and D is needed now due to small size of abscess and no fluctuance. Patient agrees with plan and is ready for discharge.       Thomasene Lot, PA-C 04/04/11 [phone]

## 2011-04-04 NOTE — Assessment & Plan Note (Signed)
Marked clinical improvement on very low doses of prednisone and classic cxr are virtually pathognomic a of sarcoid  The goal with a chronic steroid dependent illness is always arriving at the lowest effective dose that controls the disease/symptoms and not accepting a set "formula" which is based on statistics or guidelines that don't always take into account patient  variability or the natural hx of the dz in every individual patient, which may well vary over time.  For now therefore I recommend the patient maintain  A floor of 10 mg daily then return for cxr in 6 weeks

## 2011-04-04 NOTE — ED Provider Notes (Signed)
Medical screening examination/treatment/procedure(s) were performed by non-physician practitioner and as supervising physician I was immediately available for consultation/collaboration.  Cyndra Numbers, MD 04/04/11 1026

## 2011-04-06 LAB — CULTURE, ROUTINE-ABSCESS: Special Requests: NORMAL

## 2011-04-06 NOTE — ED Notes (Signed)
Patient informed of positive results and educated to MRSA precautions. 

## 2011-04-06 NOTE — ED Notes (Signed)
Results called from [facility].  Abscess Buttocks -> (+) MRSA.  Rx for Sulfa/Trimeth -> sensitive to the same & Rx for Keflex -> no sens. for this drug provided.  Call and notify pt.  General FYI set for MRSA Hx

## 2011-04-07 NOTE — ED Notes (Signed)
+   Urine  Chart sent to EDP office for review.. (given KEFLEX but not listed in sensitivities)

## 2011-04-07 NOTE — ED Notes (Signed)
Continue keflex and bactrim, No change necessary per [facility].

## 2011-05-11 ENCOUNTER — Other Ambulatory Visit: Payer: Self-pay | Admitting: Internal Medicine

## 2011-05-11 DIAGNOSIS — D869 Sarcoidosis, unspecified: Secondary | ICD-10-CM

## 2011-05-15 ENCOUNTER — Ambulatory Visit (INDEPENDENT_AMBULATORY_CARE_PROVIDER_SITE_OTHER)
Admission: RE | Admit: 2011-05-15 | Discharge: 2011-05-15 | Disposition: A | Discharge status: Home / Self Care | Payer: Self-pay | Source: Ambulatory Visit | Attending: Internal Medicine | Admitting: Internal Medicine

## 2011-05-15 ENCOUNTER — Ambulatory Visit (INDEPENDENT_AMBULATORY_CARE_PROVIDER_SITE_OTHER): Payer: Self-pay | Admitting: Internal Medicine

## 2011-05-15 ENCOUNTER — Encounter: Payer: Self-pay | Admitting: Internal Medicine

## 2011-05-15 VITALS — BP 120/80 | HR 99 | Temp 97.9°F | Ht 68.0 in | Wt 235.0 lb

## 2011-05-15 DIAGNOSIS — D869 Sarcoidosis, unspecified: Secondary | ICD-10-CM

## 2011-05-15 NOTE — Assessment & Plan Note (Addendum)
-   Steroid trial 03/06/2011  For clinical dx only but this is almost certainly sarcoid with main manifestation cough which is resolved  The goal with a chronic steroid dependent illness is always arriving at the lowest effective dose that controls the disease/symptoms and not accepting a set "formula" which is based on statistics or guidelines that don't always take into account patient  variability or the natural hx of the dz in every individual patient, which may well vary over time.  For now therefore I recommend the patient maintain  A ceiling of 20 mg per day and a floor of 5 mg per day.

## 2011-05-15 NOTE — Patient Instructions (Signed)
Prednisone try reduce to one half daily  Please schedule a follow up office visit in 6 weeks, call sooner if needed
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
## 2011-06-26 ENCOUNTER — Ambulatory Visit: Payer: Self-pay | Admitting: Internal Medicine

## 2011-11-16 ENCOUNTER — Encounter (HOSPITAL_COMMUNITY): Payer: Self-pay | Admitting: Family Medicine

## 2011-11-16 ENCOUNTER — Emergency Department (HOSPITAL_COMMUNITY)
Admission: EM | Admit: 2011-11-16 | Discharge: 2011-11-16 | Disposition: A | Discharge status: Home / Self Care | Payer: Self-pay | Attending: Emergency Medicine | Admitting: Emergency Medicine

## 2011-11-16 ENCOUNTER — Emergency Department (HOSPITAL_COMMUNITY): Payer: Self-pay

## 2011-11-16 DIAGNOSIS — R059 Cough, unspecified: Secondary | ICD-10-CM | POA: Insufficient documentation

## 2011-11-16 DIAGNOSIS — M549 Dorsalgia, unspecified: Secondary | ICD-10-CM | POA: Insufficient documentation

## 2011-11-16 DIAGNOSIS — G8929 Other chronic pain: Secondary | ICD-10-CM | POA: Insufficient documentation

## 2011-11-16 DIAGNOSIS — Z87891 Personal history of nicotine dependence: Secondary | ICD-10-CM | POA: Insufficient documentation

## 2011-11-16 DIAGNOSIS — R05 Cough: Secondary | ICD-10-CM | POA: Insufficient documentation

## 2011-11-16 MED ORDER — GUAIFENESIN-CODEINE 100-10 MG/5ML PO SYRP: 5.0000 mL | ORAL_SOLUTION | Freq: Three times a day (TID) | ORAL | Status: DC | PRN

## 2011-11-16 NOTE — ED Notes (Signed)
Per pt upper back pain. sts was concerned about his lungs because he has been on steroids before for possible sarcoidosis. sts coughing a little but has been for a while. sts has been taking aleve. without relief. Denies SOB.

## 2011-11-16 NOTE — ED Provider Notes (Signed)
History  Scribed for Raeford Razor, MD, the patient was seen in room TR07C/TR07C. This chart was scribed by Candelaria Stagers. The patient's care started at 3:16 PM   CSN: [id number]  Arrival date & time 11/16/11  1400   First MD Initiated Contact with Patient 11/16/11 1508      Chief Complaint  Patient presents with  . Back Pain     The history is provided by the patient. No language interpreter was used.   Nathan [name] is a 41 y.o. male who presents to the Emergency Department complaining of upper back pain that started about one year along with coughing.  Pt reports that for about one year he has had a cough and pulmonary issues that have been treated with an inhaler and steroids.  He denies fever, chills, leg swelling, or night sweats.  He has no other medical problems.  He has taken aleve and used a heating pad for the back pain with little relief.   History reviewed. No pertinent past medical history.  History reviewed. No pertinent past surgical history.  Family History  Problem Relation Age of Onset  . Heart disease Maternal Grandfather   . Heart disease Paternal Uncle   . Prostate cancer Paternal Uncle     History  Substance Use Topics  . Smoking status: Former Smoker -- 0.8 packs/day for 20 years    Types: Cigarettes    Quit date: 01/13/2011  . Smokeless tobacco: Never Used  . Alcohol Use: No      Review of Systems  Constitutional: Negative for fever, chills and diaphoresis.  Respiratory: Positive for cough.   Cardiovascular: Negative for leg swelling.  Musculoskeletal: Positive for back pain (upper back pain).  All other systems reviewed and are negative.    Allergies  Review of patient's allergies indicates no known allergies.  Home Medications   Current Outpatient Rx  Name Route Sig Dispense Refill  . NAPROXEN SODIUM 220 MG PO TABS Oral Take 220 mg by mouth 2 (two) times daily with a meal.      BP 140/86  Pulse 100  Temp 98 F (36.7 C)  Resp  18  SpO2 99%  Physical Exam  Nursing note and vitals reviewed. Constitutional: He is oriented to person, place, and time. He appears well-developed and well-nourished. No distress.  HENT:  Head: Normocephalic and atraumatic.  Eyes: EOM are normal. Pupils are equal, round, and reactive to light.  Neck: Neck supple. No tracheal deviation present.  Cardiovascular: Normal rate.   Pulmonary/Chest: Effort normal. No respiratory distress. He has no wheezes. He has no rales.  Abdominal: Soft. He exhibits no distension.  Musculoskeletal: Normal range of motion. He exhibits no edema.       Back non tender to palpation.  No overlying skin changes.    Neurological: He is alert and oriented to person, place, and time.  Skin: Skin is warm and dry.  Psychiatric: He has a normal mood and affect. His behavior is normal.    ED Course  Procedures   DIAGNOSTIC STUDIES: Oxygen Saturation is 99% on room air, normal by my interpretation.    COORDINATION OF CARE:     Labs Reviewed - No data to display Dg Chest 2 View  11/16/2011  *RADIOLOGY REPORT*  Clinical Data: Back pain and cough.  CHEST - 2 VIEW  Comparison: PA and lateral chest 01/30/2011 05/15/2011.  CT chest 02/13/2011.  Findings: Hilar and mediastinal lymphadenopathy most consistent with sarcoidosis is again seen.  There is some scarring in the left upper lobe.  Lungs otherwise clear.  No pneumothorax or pleural effusion.  IMPRESSION:  1.  No acute finding. 2.  Mediastinal and hilar lymphadenopathy most consistent with sarcoidosis.   Original Report Authenticated By: Bernadene Bell. D'ALESSIO, M.D.      1. Chronic upper back pain   2. Cough       MDM  41 year old male with chronic upper back pain. No red flags. Plan symptomatic treatment. Return precautions discussed. Outpatient followup  I personally preformed the services scribed in my presence. The recorded information has been reviewed and considered. Raeford Razor,  MD.       Raeford Razor, MD 11/20/11 [phone]

## 2011-11-16 NOTE — ED Notes (Signed)
Pt reports having upper back pain and is concerned bc he has had same pain and was diagnosed with sarcoidosis.  Pt was on steroids.  Pt has been coughing green mucus and sharp pain when coughing.  Pt alert oriented X4

## 2013-01-27 ENCOUNTER — Encounter (HOSPITAL_COMMUNITY): Payer: Self-pay | Admitting: Emergency Medicine

## 2013-01-27 ENCOUNTER — Emergency Department (HOSPITAL_COMMUNITY)
Admission: EM | Admit: 2013-01-27 | Discharge: 2013-01-27 | Disposition: A | Discharge status: Home / Self Care | Payer: Medicaid Other | Attending: Emergency Medicine | Admitting: Emergency Medicine

## 2013-01-27 DIAGNOSIS — X58XXXA Exposure to other specified factors, initial encounter: Secondary | ICD-10-CM | POA: Insufficient documentation

## 2013-01-27 DIAGNOSIS — Y939 Activity, unspecified: Secondary | ICD-10-CM | POA: Insufficient documentation

## 2013-01-27 DIAGNOSIS — Y929 Unspecified place or not applicable: Secondary | ICD-10-CM | POA: Insufficient documentation

## 2013-01-27 DIAGNOSIS — Z87891 Personal history of nicotine dependence: Secondary | ICD-10-CM | POA: Insufficient documentation

## 2013-01-27 DIAGNOSIS — S46819A Strain of other muscles, fascia and tendons at shoulder and upper arm level, unspecified arm, initial encounter: Secondary | ICD-10-CM

## 2013-01-27 DIAGNOSIS — S43499A Other sprain of unspecified shoulder joint, initial encounter: Secondary | ICD-10-CM | POA: Insufficient documentation

## 2013-01-27 MED ORDER — IBUPROFEN 800 MG PO TABS: 800.0000 mg | ORAL_TABLET | Freq: Three times a day (TID) | ORAL | Status: DC

## 2013-01-27 NOTE — ED Notes (Signed)
Patient states he has been having upper back pain for about 1 month.  States it comes and goes.  Denies tingling

## 2013-01-27 NOTE — ED Notes (Signed)
PA at bedside.

## 2013-01-27 NOTE — ED Provider Notes (Signed)
CSN: [id number]     Arrival date & time 01/27/13  1932 History   First MD Initiated Contact with Patient 01/27/13 2149   This chart was scribed for non-physician practitioner Mellody Drown, PA-C working with Nelia Shi, MD by Valera Castle, ED scribe. This patient was seen in room TR05C/TR05C and the patient's care was started at 9:50 PM.    Chief Complaint  Patient presents with  . Back Pain    The history is provided by the patient. No language interpreter was used.   HPI Comments: Nathan [name] is a 42 y.o. male who presents to the Emergency Department complaining of sudden, cramping, non-radiating, intermittent, bilateral upper back muscle pain, onset 1 month ago. He denies anything specifically causing the onset of the pain. He denies turning his neck exacerbating the pain. 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 He reports taking Ibuprofen with intermittent relief. He reports applying ice packs over the area, with intermittent relief. He denies fever, chills, abdominal pain, urinary symptoms, any other associated symptoms. Pt has no known allergies. Pt quit smoking 01/13/2011, .8 PPD for 8 years. Pt has no medical history.    PCP - No PCP Per Patient  History reviewed. No pertinent past medical history. History reviewed. No pertinent past surgical history. Family History  Problem Relation Age of Onset  . Heart disease Maternal Grandfather   . Heart disease Paternal Uncle   . Prostate cancer Paternal Uncle    History  Substance Use Topics  . Smoking status: Former Smoker -- 0.80 packs/day for 20 years    Types: Cigarettes    Quit date: 01/13/2011  . Smokeless tobacco: Never Used  . Alcohol Use: No    Review of Systems  Constitutional: Negative for fever and chills.  Gastrointestinal: Negative for abdominal pain.  Genitourinary: Negative.   Musculoskeletal: Positive for back pain (upper, bilateral, over muscles) and  myalgias (upper, bilateral back). Negative for gait problem, joint swelling, neck pain and neck stiffness.  All other systems reviewed and are negative.    Allergies  Review of patient's allergies indicates no known allergies.  Home Medications   Current Outpatient Rx  Name  Route  Sig  Dispense  Refill  . ibuprofen (ADVIL,MOTRIN) 200 MG tablet   Oral   Take 200 mg by mouth every 8 (eight) hours as needed for moderate pain.          BP 130/82  Pulse 80  Temp(Src) 97.6 F (36.4 C) (Oral)  Resp 16  Wt 232 lb 14.4 oz (105.643 kg)  SpO2 97%  Physical Exam  Nursing note and vitals reviewed. Constitutional: He is oriented to person, place, and time. He appears well-developed and well-nourished. No distress.  HENT:  Head: Normocephalic and atraumatic.  Eyes: EOM are normal.  Neck: Neck supple.  Cardiovascular: Normal rate.   Pulmonary/Chest: Effort normal. No respiratory distress.  Musculoskeletal: Normal range of motion.       Cervical back: He exhibits tenderness and spasm. He exhibits normal range of motion, no bony tenderness, no swelling, no edema and no deformity.       Thoracic back: Normal.       Lumbar back: Normal.  Tenderness to palpation over bilateral trapezius.    Neurological: He is alert and oriented to person, place, and time.  Skin: Skin is warm and dry.  Psychiatric: He has a normal mood and affect. His behavior is normal.  ED Course  Procedures (including critical care time)  DIAGNOSTIC STUDIES: Oxygen Saturation is 97% on room air, normal by my interpretation.    COORDINATION OF CARE: 9:57 PM-Discussed treatment plan with pt at bedside and pt agreed to plan.   Labs Review Labs Reviewed - No data to display Imaging Review No results found.  EKG Interpretation   None       MDM   1. Trapezius muscle strain, unspecified laterality, initial encounter    Pt reports pain with palpation over trapezius.  Likely muscular strain. Discussed  treatment plan with the patient. Return precautions given. Reports understanding and no other concerns at this time.  Patient is stable for discharge at this time.   Meds given in ED:  Medications - No data to display  Discharge Medication List as of 01/27/2013 10:17 PM    START taking these medications   Details  !! ibuprofen (ADVIL,MOTRIN) 800 MG tablet Take 1 tablet (800 mg total) by mouth 3 (three) times daily., Starting 01/27/2013, Until Discontinued, Print     !! - Potential duplicate medications found. Please discuss with provider.      I personally performed the services described in this documentation, which was scribed in my presence. The recorded information has been reviewed and is accurate.     Clabe Seal, PA-C 01/30/13 2110

## 2013-02-09 NOTE — ED Provider Notes (Signed)
Medical screening examination/treatment/procedure(s) were performed by non-physician practitioner and as supervising physician I was immediately available for consultation/collaboration.   Kai Calico L Chane Magner, MD 02/09/13 2057 

## 2013-05-01 ENCOUNTER — Encounter (HOSPITAL_COMMUNITY): Payer: Self-pay | Admitting: Emergency Medicine

## 2013-05-01 ENCOUNTER — Emergency Department (HOSPITAL_COMMUNITY)
Admission: EM | Admit: 2013-05-01 | Discharge: 2013-05-01 | Disposition: A | Discharge status: Home / Self Care | Payer: Medicaid Other | Attending: Emergency Medicine | Admitting: Emergency Medicine

## 2013-05-01 DIAGNOSIS — Z8619 Personal history of other infectious and parasitic diseases: Secondary | ICD-10-CM | POA: Insufficient documentation

## 2013-05-01 DIAGNOSIS — Z87891 Personal history of nicotine dependence: Secondary | ICD-10-CM | POA: Insufficient documentation

## 2013-05-01 DIAGNOSIS — Z79899 Other long term (current) drug therapy: Secondary | ICD-10-CM | POA: Insufficient documentation

## 2013-05-01 DIAGNOSIS — J45909 Unspecified asthma, uncomplicated: Secondary | ICD-10-CM | POA: Insufficient documentation

## 2013-05-01 DIAGNOSIS — M546 Pain in thoracic spine: Secondary | ICD-10-CM | POA: Insufficient documentation

## 2013-05-01 DIAGNOSIS — M549 Dorsalgia, unspecified: Secondary | ICD-10-CM

## 2013-05-01 DIAGNOSIS — M545 Low back pain, unspecified: Secondary | ICD-10-CM | POA: Insufficient documentation

## 2013-05-01 DIAGNOSIS — G8929 Other chronic pain: Secondary | ICD-10-CM | POA: Insufficient documentation

## 2013-05-01 DIAGNOSIS — R52 Pain, unspecified: Secondary | ICD-10-CM | POA: Insufficient documentation

## 2013-05-01 HISTORY — DX: Unspecified asthma, uncomplicated: J45.909

## 2013-05-01 MED ORDER — OXYCODONE-ACETAMINOPHEN 5-325 MG PO TABS: 1.0000 | ORAL_TABLET | Freq: Four times a day (QID) | ORAL | Status: DC | PRN

## 2013-05-01 MED ORDER — METAXALONE 800 MG PO TABS: 800.0000 mg | ORAL_TABLET | Freq: Three times a day (TID) | ORAL | Status: DC | PRN

## 2013-05-01 MED ORDER — OXYCODONE-ACETAMINOPHEN 5-325 MG PO TABS
1.0000 | ORAL_TABLET | Freq: Once | ORAL | Status: AC
  Administered 2013-05-01: 1 via ORAL
  Filled 2013-05-01: qty 1

## 2013-05-01 NOTE — ED Provider Notes (Signed)
CSN: [id number]     Arrival date & time 05/01/13  0618 History   First MD Initiated Contact with Patient 05/01/13 [phone]     Chief Complaint  Patient presents with  . Back Pain     (Consider location/radiation/quality/duration/timing/severity/associated sxs/prior Treatment) Patient is a 43 y.o. male presenting with back pain. The history is provided by the patient.  Back Pain Associated symptoms: no numbness and no weakness    patient presents with pain in his upper back and lower back. States he has a history of same. He states his upper back him always hurts. He states he has to take something such as ibuprofen or Jones back pain relief about every other day. He states it got worse the last day or 2. No numbness or weakness. Is worse with movement. He states he lifts things it works and thinks he may have overdone it. No difficulty breathing. No cough. No fevers. He also has pain in his lower back. As with movement. States it is also worse with walking. No numbness or weakness. No dysuria. No lack of bladder or bowel control. No falls. Patient states that his hands will occasionally cramp up, but it is decreased since he started taking potassium pills.  Past Medical History  Diagnosis Date  . Asthma     'I think I might have asthma"   History reviewed. No pertinent past surgical history. Family History  Problem Relation Age of Onset  . Heart disease Maternal Grandfather   . Heart disease Paternal Uncle   . Prostate cancer Paternal Uncle    History  Substance Use Topics  . Smoking status: Former Smoker -- 0.80 packs/day for 20 years    Types: Cigarettes    Quit date: 01/13/2011  . Smokeless tobacco: Never Used  . Alcohol Use: No    Review of Systems  Respiratory: Negative for cough and shortness of breath.   Musculoskeletal: Positive for back pain.  Neurological: Negative for weakness and numbness.      Allergies  Review of patient's allergies indicates no known  allergies.  Home Medications   Current Outpatient Rx  Name  Route  Sig  Dispense  Refill  . aspirin 500 MG buffered tablet   Oral   Take 1,000 mg by mouth every 6 (six) hours as needed for pain (Back).         . Multiple Vitamins-Minerals (MULTIVITAMIN WITH MINERALS) tablet   Oral   Take 1 tablet by mouth daily.         Marland Kitchen POTASSIUM PO   Oral   Take 1 tablet by mouth daily.         . metaxalone (SKELAXIN) 800 MG tablet   Oral   Take 1 tablet (800 mg total) by mouth 3 (three) times daily as needed for muscle spasms.   15 tablet   0   . oxyCODONE-acetaminophen (PERCOCET/ROXICET) 5-325 MG per tablet   Oral   Take 1-2 tablets by mouth every 6 (six) hours as needed for severe pain.   10 tablet   0    BP 116/78  Pulse 84  Temp(Src) 98.4 F (36.9 C) (Oral)  Resp 18  Ht 5\' 7"  (1.702 m)  Wt 220 lb (99.791 kg)  BMI 34.45 kg/m2  SpO2 95% Physical Exam  Constitutional: He appears well-developed.  HENT:  Head: Normocephalic.  Cardiovascular: Normal rate and regular rhythm.   Pulmonary/Chest: Effort normal and breath sounds normal.  Abdominal: Soft.  Musculoskeletal:  Some muscle spasm  in upper thoracic area, between her shoulder blades. No step-off or deformity of the spine. No rash good grip strength and sensation in both upper extremities. No lumbar tenderness. No paraspinal tenderness. Sensation intact over both lower extremities. Normal ambulation. Peroneal sensation intact    ED Course  Procedures (including critical care time) Labs Review Labs Reviewed - No data to display Imaging Review No results found.   EKG Interpretation None      MDM   Final diagnoses:  Back pain    Patient with back pain. Acute on chronic. No red flags. Will give some muscle relaxers and pain medicine. Patient's chart shows a history of sarcoidosis. Patient states he does not follow up with anyone for it.    Jasper Riling. Alvino Chapel, MD 05/01/13 [phone]

## 2013-05-01 NOTE — ED Notes (Signed)
Pt complains of all over back pain that started a couple of days ago and he reports he lifts heavy boxes at work and isnt sure if he may have pulled something. Painful with movement and walking. Also reports "my hands sometime lock up on me like when im busy at work moving my hands."

## 2013-05-01 NOTE — Discharge Instructions (Signed)
Back Pain, Adult Low back pain is very common. About 1 in 5 people have back pain.The cause of low back pain is rarely dangerous. The pain often gets better over time.About half of people with a sudden onset of back pain feel better in just 2 weeks. About 8 in 10 people feel better by 6 weeks.  CAUSES Some common causes of back pain include:  Strain of the muscles or ligaments supporting the spine.  Wear and tear (degeneration) of the spinal discs.  Arthritis.  Direct injury to the back. DIAGNOSIS Most of the time, the direct cause of low back pain is not known.However, back pain can be treated effectively even when the exact cause of the pain is unknown.Answering your caregiver's questions about your overall health and symptoms is one of the most accurate ways to make sure the cause of your pain is not dangerous. If your caregiver needs more information, he or she may order lab work or imaging tests (X-rays or MRIs).However, even if imaging tests show changes in your back, this usually does not require surgery. HOME CARE INSTRUCTIONS For many people, back pain returns.Since low back pain is rarely dangerous, it is often a condition that people can learn to manageon their own.   Remain active. It is stressful on the back to sit or stand in one place. Do not sit, drive, or stand in one place for more than 30 minutes at a time. Take short walks on level surfaces as soon as pain allows.Try to increase the length of time you walk each day.  Do not stay in bed.Resting more than 1 or 2 days can delay your recovery.  Do not avoid exercise or work.Your body is made to move.It is not dangerous to be active, even though your back may hurt.Your back will likely heal faster if you return to being active before your pain is gone.  Pay attention to your body when you bend and lift. Many people have less discomfortwhen lifting if they bend their knees, keep the load close to their bodies,and  avoid twisting. Often, the most comfortable positions are those that put less stress on your recovering back.  Find a comfortable position to sleep. Use a firm mattress and lie on your side with your knees slightly bent. If you lie on your back, put a pillow under your knees.  Only take over-the-counter or prescription medicines as directed by your caregiver. Over-the-counter medicines to reduce pain and inflammation are often the most helpful.Your caregiver may prescribe muscle relaxant drugs.These medicines help dull your pain so you can more quickly return to your normal activities and healthy exercise.  Put ice on the injured area.  Put ice in a plastic bag.  Place a towel between your skin and the bag.  Leave the ice on for 15-20 minutes, 03-04 times a day for the first 2 to 3 days. After that, ice and heat may be alternated to reduce pain and spasms.  Ask your caregiver about trying back exercises and gentle massage. This may be of some benefit.  Avoid feeling anxious or stressed.Stress increases muscle tension and can worsen back pain.It is important to recognize when you are anxious or stressed and learn ways to manage it.Exercise is a great option. SEEK MEDICAL CARE IF:  You have pain that is not relieved with rest or medicine.  You have pain that does not improve in 1 week.  You have new symptoms.  You are generally not feeling well. SEEK   IMMEDIATE MEDICAL CARE IF:   You have pain that radiates from your back into your legs.  You develop new bowel or bladder control problems.  You have unusual weakness or numbness in your arms or legs.  You develop nausea or vomiting.  You develop abdominal pain.  You feel faint. Document Released: 01/30/2005 Document Revised: 08/01/2011 Document Reviewed: 06/20/2010 [employer] Patient Information 2014 [employer].  

## 2013-05-05 MED ORDER — CYCLOBENZAPRINE HCL 10 MG PO TABS: 10.0000 mg | ORAL_TABLET | Freq: Two times a day (BID) | ORAL | Status: DC | PRN

## 2013-05-05 NOTE — ED Provider Notes (Signed)
Pt given rx for flexeril--couldn't afford skalaxin  Leota Jacobsen, MD 05/05/13 1626

## 2013-05-07 NOTE — Progress Notes (Signed)
Contacted patient states he picked up medication from [facility]. No further CM needs identiffied

## 2013-05-11 ENCOUNTER — Encounter (HOSPITAL_COMMUNITY): Payer: Self-pay | Admitting: Emergency Medicine

## 2013-05-11 ENCOUNTER — Emergency Department (HOSPITAL_COMMUNITY)
Admission: EM | Admit: 2013-05-11 | Discharge: 2013-05-11 | Disposition: A | Discharge status: Home / Self Care | Payer: Medicaid Other | Attending: Emergency Medicine | Admitting: Emergency Medicine

## 2013-05-11 DIAGNOSIS — L299 Pruritus, unspecified: Secondary | ICD-10-CM | POA: Insufficient documentation

## 2013-05-11 DIAGNOSIS — I10 Essential (primary) hypertension: Secondary | ICD-10-CM | POA: Insufficient documentation

## 2013-05-11 DIAGNOSIS — M549 Dorsalgia, unspecified: Secondary | ICD-10-CM

## 2013-05-11 DIAGNOSIS — J45909 Unspecified asthma, uncomplicated: Secondary | ICD-10-CM | POA: Insufficient documentation

## 2013-05-11 DIAGNOSIS — Z87891 Personal history of nicotine dependence: Secondary | ICD-10-CM | POA: Insufficient documentation

## 2013-05-11 DIAGNOSIS — Z79899 Other long term (current) drug therapy: Secondary | ICD-10-CM | POA: Insufficient documentation

## 2013-05-11 DIAGNOSIS — T4995XA Adverse effect of unspecified topical agent, initial encounter: Secondary | ICD-10-CM | POA: Insufficient documentation

## 2013-05-11 MED ORDER — HYDROCODONE-ACETAMINOPHEN 5-325 MG PO TABS
1.0000 | ORAL_TABLET | Freq: Four times a day (QID) | ORAL | Status: DC | PRN
Start: 2013-05-11 — End: 2014-07-23

## 2013-05-11 NOTE — ED Provider Notes (Signed)
CSN: [id number]     Arrival date & time 05/11/13  1357 History   First MD Initiated Contact with Patient 05/11/13 1455    This chart was scribed for non-physician practitioner, Margarita Mail, working with Ezequiel Essex, MD by Terressa Koyanagi, ED Scribe. This patient was seen in room WTR7/WTR7 and the patient's care was started at 4:01 PM.  Chief Complaint  Patient presents with  . Allergic Reaction   HPI HPI Comments: Nathan [name] is a 43 y.o. male who presents to the Emergency Department complaining of itchy skin onset last night. Pt reports that he went to [facility] yesterday for back pain and was prescribed Percocet. Pt states that he has been experiencing itching of the skin since last night after he took his first dose of Percocet. Pt also reports that he took a dose of benadryl to alleviate the itching without relief. Pt denies wheezing, tongue swelling, trouble breathing, or hives.    Past Medical History  Diagnosis Date  . Asthma     'I think I might have asthma"   History reviewed. No pertinent past surgical history. Family History  Problem Relation Age of Onset  . Heart disease Maternal Grandfather   . Heart disease Paternal Uncle   . Prostate cancer Paternal Uncle    History  Substance Use Topics  . Smoking status: Former Smoker -- 0.80 packs/day for 20 years    Types: Cigarettes    Quit date: 01/13/2011  . Smokeless tobacco: Never Used  . Alcohol Use: No    Review of Systems  Respiratory: Negative.  Negative for wheezing.   Skin: Negative for rash.       No Hives Itching of extremities.    Allergies  Review of patient's allergies indicates no known allergies.  Home Medications   Current Outpatient Rx  Name  Route  Sig  Dispense  Refill  . Aspirin-Caffeine (BAYER BACK & BODY PAIN EX ST) 500-32.5 MG TABS   Oral   Take 2 tablets by mouth every 6 (six) hours as needed (pain).         . cyclobenzaprine (FLEXERIL) 10 MG tablet   Oral   Take 1 tablet (10  mg total) by mouth 2 (two) times daily as needed for muscle spasms.   20 tablet   0   . Multiple Vitamins-Minerals (MULTIVITAMIN WITH MINERALS) tablet   Oral   Take 1 tablet by mouth daily.         Marland Kitchen oxyCODONE-acetaminophen (PERCOCET/ROXICET) 5-325 MG per tablet   Oral   Take 1-2 tablets by mouth every 6 (six) hours as needed for severe pain.   10 tablet   0   . POTASSIUM PO   Oral   Take 1 tablet by mouth daily.          BP 126/106  Pulse 90  Resp 16  Wt 220 lb (99.791 kg)  SpO2 100% Physical Exam  Nursing note and vitals reviewed. Constitutional: He is oriented to person, place, and time. He appears well-developed and well-nourished. No distress.  HENT:  Head: Normocephalic and atraumatic.  No swelling of pharynx, lips, tongue.   Eyes: EOM are normal.  Neck: Neck supple. No tracheal deviation present.  Cardiovascular: Normal rate.   Pulmonary/Chest: Effort normal. No respiratory distress. He has no wheezes.  Musculoskeletal: Normal range of motion.  Neurological: He is alert and oriented to person, place, and time.  Skin: Skin is warm and dry. No rash noted.  Psychiatric: He has  a normal mood and affect. His behavior is normal.   ED Course  Procedures (including critical care time)  DIAGNOSTIC STUDIES: Oxygen Saturation is 100% on RA, normal by my interpretation.    COORDINATION OF CARE:  4:07 PM-Discussed treatment plan which includes medication change, referral for monitoring pt's elevated BP (126/106) with pt at bedside and pt agreed to plan.   Labs Review Labs Reviewed - No data to display Imaging Review No results found.   EKG Interpretation None      MDM   Final diagnoses:  Pruritus of skin  Back pain  Hypertension   D/c percocet/ felxaril. Can try hydrocodone. Benadryl for itching. Return for signs of airway involvement. Return precautions discussed.  I personally performed the services described in this documentation, which was  scribed in my presence. The recorded information has been reviewed and is accurate.    Margarita Mail, PA-C 05/12/13 [phone]

## 2013-05-11 NOTE — ED Notes (Signed)
Patient states that he went to [facility] for back pain. The patient reports that last night he took his muscle relaxer and percocet and he started to itch all over. The patient reports that he is still having back pain

## 2013-05-11 NOTE — Discharge Instructions (Signed)
Drug Allergy Allergic reactions to medicines are common. Some allergic reactions are mild. A delayed type of drug allergy that occurs 1 week or more after exposure to a medicine or vaccine is called serum sickness. A life-threatening, sudden (acute) allergic reaction that involves the whole body is called anaphylaxis. CAUSES  "True" drug allergies occur when there is an allergic reaction to a medicine. This is caused by overactivity of the immune system. First, the body becomes sensitized. The immune system is triggered by your first exposure to the medicine. Following this first exposure, future exposure to the same medicine may be life-threatening. Almost any medicine can cause an allergic reaction. Common ones are:  Penicillin.  Sulfonamides (sulfa drugs).  Local anesthetics.  X-ray dyes that contain iodine. SYMPTOMS  Common symptoms of a minor allergic reaction are:  Swelling around the mouth.  An itchy red rash or hives.  Vomiting or diarrhea. Anaphylaxis can cause swelling of the mouth and throat. This makes it difficult to breathe and swallow. Severe reactions can be fatal within seconds, even after exposure to only a trace amount of the drug that causes the reaction. HOME CARE INSTRUCTIONS   If you are unsure of what caused your reaction, keep a diary of foods and medicines used. Include the symptoms that followed. Avoid anything that causes reactions.  You may want to follow up with an allergy specialist after the reaction has cleared in order to be tested to confirm the allergy. It is important to confirm that your reaction is an allergy, not just a side effect to the medicine. If you have a true allergy to a medicine, this may prevent that medicine and related medicines from being given to you when you are very ill.  If you have hives or a rash:  Take medicines as directed by your caregiver.  You may use an over-the-counter antihistamine (diphenhydramine) as  needed.  Apply cold compresses to the skin or take baths in cool water. Avoid hot baths or showers.  If you are severely allergic:  Continuous observation after a severe reaction may be needed. Hospitalization is often required.  Wear a medical alert bracelet or necklace stating your allergy.  You and your family must learn how to use an anaphylaxis kit or give an epinephrine injection to temporarily treat an emergency allergic reaction. If you have had a severe reaction, always carry your epinephrine injection or anaphylaxis kit with you. This can be lifesaving if you have a severe reaction.  Do not drive or perform tasks after treatment until the medicines used to treat your reaction have worn off, or until your caregiver says it is okay. SEEK MEDICAL CARE IF:   You think you had an allergic reaction. Symptoms usually start within 30 minutes after exposure.  Symptoms are getting worse rather than better.  You develop new symptoms.  The symptoms that brought you to your caregiver return. SEEK IMMEDIATE MEDICAL CARE IF:   You have swelling of the mouth, difficulty breathing, or wheezing.  You have a tight feeling in your chest or throat.  You develop hives, swelling, or itching all over your body.  You develop severe vomiting or diarrhea.  You feel faint or pass out. This is an emergency. Use your epinephrine injection or anaphylaxis kit as you have been instructed. Call for emergency medical help. Even if you improve after the injection, you need to be examined at a hospital emergency department. MAKE SURE YOU:   Understand these instructions.  Will watch your condition.  Will get help right away if you are not doing well or get worse. Document Released: 01/30/2005 Document Revised: 04/24/2011 Document Reviewed: 07/06/2010 [facility] Patient Information 2014 [place], [place].     Hypertension As your heart beats, it forces blood through your arteries. This force is your  blood pressure. If the pressure is too high, it is called hypertension (HTN) or high blood pressure. HTN is dangerous because you may have it and not know it. High blood pressure may mean that your heart has to work harder to pump blood. Your arteries may be narrow or stiff. The extra work puts you at risk for heart disease, stroke, and other problems.  Blood pressure consists of two numbers, a higher number over a lower, 110/72, for example. It is stated as "110 over 72." The ideal is below 120 for the top number (systolic) and under 80 for the bottom (diastolic). Write down your blood pressure today. You should pay close attention to your blood pressure if you have certain conditions such as:  Heart failure.  Prior heart attack.  Diabetes  Chronic kidney disease.  Prior stroke.  Multiple risk factors for heart disease. To see if you have HTN, your blood pressure should be measured while you are seated with your arm held at the level of the heart. It should be measured at least twice. A one-time elevated blood pressure reading (especially in the Emergency Department) does not mean that you need treatment. There may be conditions in which the blood pressure is different between your right and left arms. It is important to see your caregiver soon for a recheck. Most people have essential hypertension which means that there is not a specific cause. This type of high blood pressure may be lowered by changing lifestyle factors such as:  Stress.  Smoking.  Lack of exercise.  Excessive weight.  Drug/tobacco/alcohol use.  Eating less salt. Most people do not have symptoms from high blood pressure until it has caused damage to the body. Effective treatment can often prevent, delay or reduce that damage. TREATMENT  When a cause has been identified, treatment for high blood pressure is directed at the cause. There are a large number of medications to treat HTN. These fall into several  categories, and your caregiver will help you select the medicines that are best for you. Medications may have side effects. You should review side effects with your caregiver. If your blood pressure stays high after you have made lifestyle changes or started on medicines,   Your medication(s) may need to be changed.  Other problems may need to be addressed.  Be certain you understand your prescriptions, and know how and when to take your medicine.  Be sure to follow up with your caregiver within the time frame advised (usually within two weeks) to have your blood pressure rechecked and to review your medications.  If you are taking more than one medicine to lower your blood pressure, make sure you know how and at what times they should be taken. Taking two medicines at the same time can result in blood pressure that is too low. SEEK IMMEDIATE MEDICAL CARE IF:  You develop a severe headache, blurred or changing vision, or confusion.  You have unusual weakness or numbness, or a faint feeling.  You have severe chest or abdominal pain, vomiting, or breathing problems. MAKE SURE YOU:   Understand these instructions.  Will watch your condition.  Will get help right away if  you are not doing well or get worse. Document Released: 01/30/2005 Document Revised: 04/24/2011 Document Reviewed: 09/20/2007 [facility] Patient Information 2014 [place].  Pruritus  Pruritis is an itch. There are many different problems that can cause an itch. Dry skin is one of the most common causes of itching. Most cases of itching do not require medical attention.  HOME CARE INSTRUCTIONS  Make sure your skin is moistened on a regular basis. A moisturizer that contains petroleum jelly is best for keeping moisture in your skin. If you develop a rash, you may try the following for relief:   Use corticosteroid cream.  Apply cool compresses to the affected areas.  Bathe with Epsom salts or baking soda in the  bathwater.  Soak in colloidal oatmeal baths. These are available at your pharmacy.  Apply baking soda paste to the rash. Stir water into baking soda until it reaches a paste-like consistency.  Use an anti-itch lotion.  Take over-the-counter diphenhydramine medicine by mouth as the instructions direct.  Avoid scratching. Scratching may cause the rash to become infected. If itching is very bad, your caregiver may suggest prescription lotions or creams to lessen your symptoms.  Avoid hot showers, which can make itching worse. A cold shower may help with itching as long as you use a moisturizer after the shower. SEEK MEDICAL CARE IF: The itching does not go away after several days. Document Released: 10/12/2010 Document Revised: 04/24/2011 Document Reviewed: 10/12/2010 [facility] Patient Information 2014 [place], [place].

## 2013-05-12 NOTE — ED Provider Notes (Signed)
Medical screening examination/treatment/procedure(s) were performed by non-physician practitioner and as supervising physician I was immediately available for consultation/collaboration.   EKG Interpretation None       Ezequiel Essex, MD 05/12/13 [phone]

## 2013-06-21 ENCOUNTER — Encounter (HOSPITAL_COMMUNITY): Payer: Self-pay | Admitting: Emergency Medicine

## 2013-06-21 ENCOUNTER — Emergency Department (HOSPITAL_COMMUNITY)
Admission: EM | Admit: 2013-06-21 | Discharge: 2013-06-21 | Disposition: A | Discharge status: Home / Self Care | Payer: Medicaid Other | Attending: Emergency Medicine | Admitting: Emergency Medicine

## 2013-06-21 ENCOUNTER — Emergency Department (HOSPITAL_COMMUNITY): Payer: Medicaid Other

## 2013-06-21 DIAGNOSIS — Z79899 Other long term (current) drug therapy: Secondary | ICD-10-CM | POA: Insufficient documentation

## 2013-06-21 DIAGNOSIS — J45909 Unspecified asthma, uncomplicated: Secondary | ICD-10-CM | POA: Insufficient documentation

## 2013-06-21 DIAGNOSIS — R059 Cough, unspecified: Secondary | ICD-10-CM

## 2013-06-21 DIAGNOSIS — R05 Cough: Secondary | ICD-10-CM

## 2013-06-21 DIAGNOSIS — M549 Dorsalgia, unspecified: Secondary | ICD-10-CM

## 2013-06-21 DIAGNOSIS — D869 Sarcoidosis, unspecified: Secondary | ICD-10-CM

## 2013-06-21 DIAGNOSIS — Z87891 Personal history of nicotine dependence: Secondary | ICD-10-CM | POA: Insufficient documentation

## 2013-06-21 MED ORDER — PREDNISONE 20 MG PO TABS: 40.0000 mg | ORAL_TABLET | Freq: Every day | ORAL | Status: DC

## 2013-06-21 MED ORDER — TRAMADOL-ACETAMINOPHEN 37.5-325 MG PO TABS: 1.0000 | ORAL_TABLET | Freq: Four times a day (QID) | ORAL | Status: DC | PRN

## 2013-06-21 MED ORDER — PROMETHAZINE-CODEINE 6.25-10 MG/5ML PO SYRP: 5.0000 mL | ORAL_SOLUTION | Freq: Four times a day (QID) | ORAL | Status: DC | PRN

## 2013-06-21 NOTE — Discharge Instructions (Signed)
Take the prescribed medication as directed. Follow-up with ENT, Dr. Constance Holster regarding growth on nose. Establish care with a primary care office in the area for close monitoring of your sarcoidosis.  Resource guide attached to help with this. Return to the ED for new or worsening symptoms.   Emergency Department Resource Guide 1) Find a Doctor and Pay Out of Pocket Although you won't have to find out who is covered by your insurance plan, it is a good idea to ask around and get recommendations. You will then need to call the office and see if the doctor you have chosen will accept you as a new patient and what types of options they offer for patients who are self-pay. Some doctors offer discounts or will set up payment plans for their patients who do not have insurance, but you will need to ask so you aren't surprised when you get to your appointment.  2) Contact Your Local Health Department Not all health departments have doctors that can see patients for sick visits, but many do, so it is worth a call to see if yours does. If you don't know where your local health department is, you can check in your phone book. The CDC also has a tool to help you locate your state's health department, and many state websites also have listings of all of their local health departments.  3) Find a [facility] If your illness is not likely to be very severe or complicated, you may want to try a walk in clinic. These are popping up all over the country in pharmacies, drugstores, and shopping centers. They're usually staffed by nurse practitioners or physician assistants that have been trained to treat common illnesses and complaints. They're usually fairly quick and inexpensive. However, if you have serious medical issues or chronic medical problems, these are probably not your best option.  No Primary Care Doctor: - Call Health Connect at  [phone] - they can help you locate a primary care doctor that  accepts your  insurance, provides certain services, etc. - Physician Referral Service- [phone]  Chronic Pain Problems: Organization         Address  Phone   Notes  [facility]  [phone] Patients need to be referred by their primary care doctor.   Medication Assistance: Organization         Address  Phone   Notes  [facility] Medication [facility] [address] [place], [place] [address] [phone] --Must be a resident of [facility] -- Must have NO insurance coverage whatsoever (no Medicaid/ Medicare, etc.) -- The pt. MUST have a primary care doctor that directs their care regularly and follows them in the community   [facility]  [phone]   [employer]  [phone]    Agencies that provide inexpensive medical care: Organization         Address  Phone   Notes  [place]  [phone]   [facility]    [phone]   [facility] [place], [place] [address] [phone]   [facility] [address], [place] [phone]   Planned Parenthood    [phone]   [facility]    [phone]   [place] [facility] [place] [address], [place] Phone:  [phone], Fax:  [phone] Hours of Operation:  9 am -  6 pm, M-F.  Also accepts Medicaid/Medicare and self-pay.  °[facility] ° [address], [place] Phone: ([phone]) [phone], Fax: [phone]. Hours of Operation:  8:30 am - 5:30 pm, M-F.  Also accepts Medicaid and self-pay.  °[facility] [place] [address], [place] Phone: ([phone]) [phone]   °[facility] [address], [place], [place] (336)[phone], Ext. 123 Mondays & Thursdays: 7-9 AM.  First 15 patients are seen on a first come, first serve basis. °  ° °Medicaid-accepting [place] Providers: ° °Organization          Address  Phone   Notes  °[facility] [address], [place] ([phone]) [phone] Also accepts self-pay patients.  °[facility] [address], [place] ° (336) [phone]   °[facility] [address], [place] (336) 288-8857   °[facility] [address] [place] [address], [place] (336) 299-7000   °Veita Bland [address], [place]  ° (336) [phone] Only accepts [place] Access Medicaid patients after they have their name applied to their card.  ° °Self-Pay (no insurance) in [place]: ° °Organization         Address  Phone   Notes  °Sickle Cell Patients, [facility] [address], [place] (336) 832-1970   °[facility] [address], [place] (336) 832-4400   ° [facility] [place] ° [address], [place] (336) [phone]   °[facility]/Dr. Osei-Bonsu ° [address] [place] [address], [place] or [address], [place] ([phone]) [phone] Phone number for both [place] and [place] locations is the same.  °[facility] [address], [place] (336) [phone]   °[facility] [address] [place] [address], [place] or [address] (336) 852-7530 °(336) 878-2260   °[facility] [address], [place] (336) 350-1642, phone; ([phone]) [phone], fax Sees patients 1st and 3rd Saturday of every month.  Must not qualify for public or private insurance (i.e. Medicaid, Medicare, [place] Health Choice, Veterans' Benefits) • Household income should be no more than 200% of the poverty level •The clinic cannot treat you if you are pregnant or think you are pregnant • Sexually transmitted diseases are not treated at the clinic.  ° ° °Dental Care: °Organization         Address  Phone  Notes  °[place] [facility] [address],  [place] (336) [phone] Accepts children up to age 21 who are enrolled in Medicaid or [place] Health Choice; pregnant women with a Medicaid card; and children who have applied for Medicaid or [place] Health Choice, but were declined, whose parents can pay a reduced fee at time of service.  °[place] [facility] [place]  [address], [place] (336) 641-7733 Accepts children up to age 21 who are enrolled in Medicaid or [place] Health Choice; pregnant women with a Medicaid card; and children who have applied for Medicaid or [place] Health Choice, but were declined, whose parents can pay a reduced fee at time of service.  °[facility] ° [address], [place] (336) 641-4533 Patients are seen by appointment only. Walk-ins are not accepted. [facility] will see patients 18 years of age and   older. °Monday - Tuesday (8am-5pm) °Most Wednesdays (8:30-5pm) °$30 per visit, cash only  °[facility] ° [address], [place] (336) [phone] Patients are seen by appointment only. Walk-ins are not accepted. [facility] will see patients 18 years of age and older. °One Wednesday Evening (Monthly: Volunteer Based).  $30 per visit, cash only  °[facility]  ([phone]) [phone] for adults; Children under age 4, call Graduate Pediatric Dentistry at ([phone]. Children aged 4-14, please call ([phone]) [phone] to request a pediatric application. ° Dental services are provided in all areas of dental care including fillings, crowns and bridges, complete and partial dentures, implants, gum treatment, root canals, and extractions. Preventive care is also provided. Treatment is provided to both adults and children. °Patients are selected via a lottery and there is often a waiting list. °  °[facility] [address], °[place] ° (336) 763-8833 [email or web address] °  °[facility] [address], [place], [place]  (336)723-1848, Ext. 123 Second and Fourth Thursday of each month, opens at 6:30 AM; Clinic ends at 9 AM.  Patients are seen on a first-come first-served basis, and a limited number are seen during each clinic.  ° °[facility] ° [address], [place], [place] (336) [phone]   Eligibility Requirements °You must have lived in [place], [place], or [place] counties for at least the last three months. °  You cannot be eligible for state or federal sponsored healthcare insurance, including Veterans Administration, Medicaid, or Medicare. °  You generally cannot be eligible for healthcare insurance through your employer.  °  How to apply: °Eligibility screenings are held every Tuesday and Wednesday afternoon from 1:00 pm until 4:00 pm. You do not need an appointment for the interview!  °[facility] [address], [place], [place] 336-631-2330   °[place] [facility]  336-342-8273   °[place] [facility]  336-703-3100   °[facility]  336-570-6415   ° °Behavioral Health Resources in the Community: °Intensive Outpatient Programs °Organization         Address  Phone  Notes  °[facility] [address], [place], [place] 336-878-6098   °[facility] [address], [place], [place] 336-832-9800   °[facility] [address], [place], [place] ° 336-882-2125   °[place] [address],  °[place], [place] 1-800-853-5163 or 336-641-4981   °Substance Abuse Resources °Organization         Address  Phone  Notes  °[facility]  336-882-2125   °[facility]  336-784-9470   °[facility]  336-285-9073   °[facility]  336-845-3988   °Residential & Outpatient Substance Abuse Program  1-800-659-3381   °Psychological Services °Organization         Address  Phone  Notes  °[facility]  336- 832-9600   °[facility]  336- 378-7881    °[facility] [address], [place] 1-800-853-5163 or 336-641-4981   ° °Mobile Crisis Teams °Organization         Address  Phone  Notes  °[facility], Mobile Crisis Care Unit  1-877-626-1772   °Assertive °[facility] ° [address] [place], [place] 336-834-9664   °Sharon DeEsch [address] °[place]  336-554-5454   ° °Self-Help/Support Groups °Organization         Address    Phone             Notes  °[facility] - variety of support groups  336- 373-1402 Call for more information  °Narcotics Anonymous (NA), [facility] [address], °[place]  2 meetings at this location  ° °Residential Treatment Programs °Organization         Address  Phone  Notes  °[facility] [address],    °[place]  1-866-801-8205   °[facility] ° [address], [place], [place] 704-293-8524   °[facility] [address], [place] 336-845-3988 Admissions: 8am-3pm M-F  °[facility] [address]    °[place], [place] 336-841-1104   °[facility] [address], [place], [place] 336-379-7146   °[facility] [address]  °[place], [place] 336-285-9073   °[facility] - Intensive Outpatient [address], [place], [place] 336-852-3033   °[facility] [address]  °[place], [place] 1-877-615-2722 or 336-784-9470   °[facility] ([facility] [address] [place], [place] 336-227-7417 Accepts Medicaid  °[facility] [address]  ° [place] 1-800-659-3381 Substance Abuse/Addiction Treatment  ° °[place] Behavioral Health Resources °Organization         Address  Phone  Notes  °[facility]  (888) 581-9988   °Julie Brannon, PhD [address] [place], [place]   (336) 349-5553 or (336) 951-0000   °[facility]   [address] °[place], [place] (336) 349-4454   °[facility] [address], [place], [place] (336) 342-8316 Insurance/Medicaid/sponsorship through [facility]  °[facility] [address]                                    [place], [place] (336) 342-8316 Therapy/tele-psych/case  °[facility] [address]  ° [place], [place] (336) 349-2233    °Dr. Arfeen  (336) 349-4544   °[facility] [place]  [employer] [place] [facility] 1) [address], [place] °2) [address], [place] °3)  [address], [place] (336) 349-3220 °(336) 342-7768 ° °(336) 342-8140   °[place] Child Abuse [facility] (336) 342-1394 or (336) 342-3537 (After Hours)    ° ° ° °

## 2013-06-21 NOTE — ED Provider Notes (Signed)
Medical screening examination/treatment/procedure(s) were performed by non-physician practitioner and as supervising physician I was immediately available for consultation/collaboration.   EKG Interpretation None       Lynnette Pote, MD 06/21/13 2354 

## 2013-06-21 NOTE — ED Notes (Signed)
Pt reports cough off and on for past couple of years. Dx with sarcoidosis, no further f/u due to lack of insurance. This episode has been going on for a couple of months. Reports thick green phlegm with cough. C/o upper and lower back pain, sts this is constant. But worse with cough. Believes this is unrelated to cough, sts he lifts heavy things at work and pain has been going on for months as well. No recent change this week or today to prompt today's visit.

## 2013-06-21 NOTE — ED Provider Notes (Signed)
CSN: [id number]     Arrival date & time 06/21/13  1818 History  This chart was scribed for non-physician practitioner Quincy Carnes working with Orlie Dakin, MD by Donato Schultz, ED Scribe. This patient was seen in room [facility] and the patient's care was started at 6:59 PM.     Chief Complaint  Patient presents with  . Cough  . Back Pain    Patient is a 43 y.o. male presenting with cough and back pain. The history is provided by the patient. No language interpreter was used.  Cough Associated symptoms: no fever   Back Pain Associated symptoms: no fever    HPI Comments: Nathan [name] is a 43 y.o. male who presents to the Emergency Department complaining of constant cough productive of green, thick sputum that has been ongoing for the last couple of years.  The patient was previously diagnosed with sarcoidosis but has not seen a doctor for his condition due to lack of insurance. He denies fever as an associated symptom.  The patient denies any sick contacts.  He states that he has noticed a small growth on the edge of his right nare that he suspects may be caused by the sarcoidosis.    The patient is also complaining of constant bilateral shoulder pain and lower back pain.  He states that his cough aggravates his back pain.  The patient states that he has taken Motrin and applied ice to his back without relief to his symptoms. The patient states that he has been seen at the ED for his back pain and was discharged with a prescription for muscle relaxers.  He states that he has tried taking the muscle relaxers with no relief to his symptoms.  He states that he constantly lifts things while at work. Denies numbness/paresthesias or weakness of extremities.  No loss of bowel or bladder control.   Past Medical History  Diagnosis Date  . Asthma     'I think I might have asthma"   History reviewed. No pertinent past surgical history. Family History  Problem Relation Age of Onset  . Heart  disease Maternal Grandfather   . Heart disease Paternal Uncle   . Prostate cancer Paternal Uncle    History  Substance Use Topics  . Smoking status: Former Smoker -- 0.80 packs/day for 20 years    Types: Cigarettes    Quit date: 01/13/2011  . Smokeless tobacco: Never Used  . Alcohol Use: No    Review of Systems  Constitutional: Negative for fever.  Respiratory: Positive for cough.   Musculoskeletal: Positive for back pain.  All other systems reviewed and are negative.     Allergies  Percocet  Home Medications   Prior to Admission medications   Medication Sig Start Date End Date Taking? Authorizing Provider  Aspirin-Caffeine (BAYER BACK & BODY PAIN EX ST) 500-32.5 MG TABS Take 2 tablets by mouth every 6 (six) hours as needed (pain).    Historical Provider, MD  cyclobenzaprine (FLEXERIL) 10 MG tablet Take 1 tablet (10 mg total) by mouth 2 (two) times daily as needed for muscle spasms. 05/05/13   Leota Jacobsen, MD  HYDROcodone-acetaminophen (NORCO) 5-325 MG per tablet Take 1-2 tablets by mouth every 6 (six) hours as needed for moderate pain. 05/11/13   Margarita Mail, PA-C  Multiple Vitamins-Minerals (MULTIVITAMIN WITH MINERALS) tablet Take 1 tablet by mouth daily.    Historical Provider, MD  oxyCODONE-acetaminophen (PERCOCET/ROXICET) 5-325 MG per tablet Take 1-2 tablets by mouth every 6 (  six) hours as needed for severe pain. 05/01/13   Jasper Riling. Pickering, MD  POTASSIUM PO Take 1 tablet by mouth daily.    Historical Provider, MD   Triage Vitals: BP 130/83  Pulse 85  Temp(Src) 98.3 F (36.8 C) (Oral)  Resp 14  SpO2 98%  Physical Exam  Nursing note and vitals reviewed. Constitutional: He is oriented to person, place, and time. He appears well-developed and well-nourished. No distress.  HENT:  Head: Normocephalic and atraumatic.  Nose: No septal deviation or nasal septal hematoma.  Mouth/Throat: Oropharynx is clear and moist.  2 small lobulated growths of right external  nare; non-tender; no surrounding erythema, induration, or cellulitic change; no drainage or abscess formation; no septal deviation or internal deformity  Eyes: Conjunctivae and EOM are normal. Pupils are equal, round, and reactive to light.  Neck: Normal range of motion. Neck supple.  Cardiovascular: Normal rate, regular rhythm and normal heart sounds.   Pulmonary/Chest: Effort normal and breath sounds normal. No respiratory distress. He has no wheezes. He has no rales.  Musculoskeletal: Normal range of motion.       Cervical back: Normal.       Thoracic back: Normal.       Lumbar back: Normal.  Neurological: He is alert and oriented to person, place, and time.  Skin: Skin is warm and dry. He is not diaphoretic.  Psychiatric: He has a normal mood and affect.    ED Course  Procedures (including critical care time)  DIAGNOSTIC STUDIES: Oxygen Saturation is 98% on room air, normal by my interpretation.    COORDINATION OF CARE: 7:04 PM- Discussed obtaining a chest x-ray with the patient and the patient agreed to the treatment plan.   Labs Review Labs Reviewed - No data to display  Imaging Review Dg Chest 2 View  06/21/2013   CLINICAL DATA:  Back pain and cough for a few days.  EXAM: CHEST  2 VIEW  COMPARISON:  11/16/2011 and chest CT 02/13/2011  FINDINGS: Lungs are adequately inflated with subtle interstitial prominence over the mid to lower lungs unchanged. No definite focal airspace consolidation. No evidence of effusion. Subtle nodular density in the right lung base likely part of patient's known parenchymal lung disease and likely related to known sarcoidosis. Cardiac silhouette is within normal. There is evidence of patient's known bilateral hilar/mediastinal adenopathy. Remainder the exam is unchanged.  IMPRESSION: No acute cardiopulmonary disease.  Mild parenchymal changes as well as bilateral hilar/ mediastinal adenopathy compatible patient's known sarcoidosis.   Electronically Signed    By: Marin Olp M.D.   On: 06/21/2013 19:46     EKG Interpretation None      MDM   Final diagnoses:  Sarcoidosis  Cough  Back pain   X-ray negative for acute findings.  Pt denies SOB and is in no respiratory distress, O2 sats stable on RA.  Pt will be started on prednisone and cough meds.   Back pain without red flag sx, likely muscular strain. Rx Ultracet. Growths on right nostril will need removal and likely sent to pathology to r/o cancerous changes.  Will refer to ENT for removal. History encourage patient to followup with her primary care physician in the area for close monitoring of his sarcoidosis. Resource guide given to help with this. Discussed plan with patient, he/she acknowledged understanding and agreed with plan of care.  Return precautions given for new or worsening symptoms.  I personally performed the services described in this documentation, which was scribed  in my presence. The recorded information has been reviewed and is accurate.  Larene Pickett, PA-C 06/21/13 2001

## 2014-03-16 ENCOUNTER — Emergency Department (HOSPITAL_COMMUNITY)
Admission: EM | Admit: 2014-03-16 | Discharge: 2014-03-16 | Disposition: A | Discharge status: Home / Self Care | Payer: Medicaid Other | Attending: Emergency Medicine | Admitting: Emergency Medicine

## 2014-03-16 ENCOUNTER — Emergency Department (HOSPITAL_COMMUNITY): Payer: Medicaid Other

## 2014-03-16 ENCOUNTER — Encounter (HOSPITAL_COMMUNITY): Payer: Self-pay | Admitting: Emergency Medicine

## 2014-03-16 DIAGNOSIS — J45901 Unspecified asthma with (acute) exacerbation: Secondary | ICD-10-CM | POA: Insufficient documentation

## 2014-03-16 DIAGNOSIS — D869 Sarcoidosis, unspecified: Secondary | ICD-10-CM

## 2014-03-16 DIAGNOSIS — Z7982 Long term (current) use of aspirin: Secondary | ICD-10-CM | POA: Insufficient documentation

## 2014-03-16 DIAGNOSIS — Z87891 Personal history of nicotine dependence: Secondary | ICD-10-CM | POA: Insufficient documentation

## 2014-03-16 DIAGNOSIS — Z79899 Other long term (current) drug therapy: Secondary | ICD-10-CM | POA: Insufficient documentation

## 2014-03-16 HISTORY — DX: Sarcoidosis, unspecified: D86.9

## 2014-03-16 LAB — COMPREHENSIVE METABOLIC PANEL
ALT: 50 U/L (ref 0–53)
AST: 62 U/L — ABNORMAL HIGH (ref 0–37)
Albumin: 4.1 g/dL (ref 3.5–5.2)
Alkaline Phosphatase: 94 U/L (ref 39–117)
Anion gap: 9 (ref 5–15)
BUN: 14 mg/dL (ref 6–23)
CO2: 27 mmol/L (ref 19–32)
Calcium: 8.6 mg/dL (ref 8.4–10.5)
Chloride: 107 mmol/L (ref 96–112)
Creatinine, Ser: 0.66 mg/dL (ref 0.50–1.35)
GFR calc Af Amer: >90 mL/min (ref >90)
GFR calc non Af Amer: >90 mL/min (ref >90)
Glucose, Bld: 117 mg/dL — ABNORMAL HIGH (ref 70–99)
Potassium: 3.7 mmol/L (ref 3.5–5.1)
Sodium: 143 mmol/L (ref 135–145)
Total Bilirubin: 0.6 mg/dL (ref 0.3–1.2)
Total Protein: 7.8 g/dL (ref 6.0–8.3)

## 2014-03-16 LAB — CBC WITH DIFFERENTIAL/PLATELET
Basophils Absolute: 0 10*3/uL (ref 0.0–0.1)
Basophils Relative: 0 % (ref 0–1)
Eosinophils Absolute: 0.4 10*3/uL (ref 0.0–0.7)
Eosinophils Relative: 7 % — ABNORMAL HIGH (ref 0–5)
HCT: 39.6 % (ref 39.0–52.0)
Hemoglobin: 13.2 g/dL (ref 13.0–17.0)
Lymphocytes Relative: 20 % (ref 12–46)
Lymphs Abs: 1 10*3/uL (ref 0.7–4.0)
MCH: 28.1 pg (ref 26.0–34.0)
MCHC: 33.3 g/dL (ref 30.0–36.0)
MCV: 84.4 fL (ref 78.0–100.0)
Monocytes Absolute: 0.5 10*3/uL (ref 0.1–1.0)
Monocytes Relative: 11 % (ref 3–12)
Neutro Abs: 3 10*3/uL (ref 1.7–7.7)
Neutrophils Relative %: 62 % (ref 43–77)
Platelets: 261 10*3/uL (ref 150–400)
RBC: 4.69 MIL/uL (ref 4.22–5.81)
RDW: 13.4 % (ref 11.5–15.5)
WBC: 4.8 10*3/uL (ref 4.0–10.5)

## 2014-03-16 LAB — I-STAT TROPONIN, ED: Troponin i, poc: 0.01 ng/mL (ref 0.00–0.08)

## 2014-03-16 MED ORDER — METHYLPREDNISOLONE SODIUM SUCC 125 MG IJ SOLR
125.0000 mg | Freq: Once | INTRAMUSCULAR | Status: AC
  Administered 2014-03-16: 125 mg via INTRAVENOUS
  Filled 2014-03-16: qty 2

## 2014-03-16 MED ORDER — SODIUM CHLORIDE 0.9 % IV BOLUS (SEPSIS)
1000.0000 mL | Freq: Once | INTRAVENOUS | Status: AC
  Administered 2014-03-16: 1000 mL via INTRAVENOUS

## 2014-03-16 MED ORDER — PREDNISONE 20 MG PO TABS: 40.0000 mg | ORAL_TABLET | Freq: Every day | ORAL | Status: DC

## 2014-03-16 MED ORDER — DIPHENHYDRAMINE HCL 50 MG/ML IJ SOLN
25.0000 mg | Freq: Once | INTRAMUSCULAR | Status: AC
  Administered 2014-03-16: 25 mg via INTRAVENOUS
  Filled 2014-03-16: qty 1

## 2014-03-16 MED ORDER — MORPHINE SULFATE 4 MG/ML IJ SOLN
4.0000 mg | Freq: Once | INTRAMUSCULAR | Status: AC
Start: 2014-03-16 — End: 2014-03-16
  Administered 2014-03-16: 4 mg via INTRAVENOUS
  Filled 2014-03-16: qty 1

## 2014-03-16 NOTE — Discharge Instructions (Signed)
Take prednisone as prescribed.   You can take benadryl for itchiness.   You need a primary care doctor.   Return to ER if you have worse itchiness, trouble breathing.

## 2014-03-16 NOTE — ED Notes (Signed)
Pt from home c/o shortness of breath and itchy rash to arms, chest, and legs. Recent Diagnoses of Sarcoidosis. He c/o shortness of breath on exertion and itching that is worse at night. He reports using benadryl without relief.

## 2014-03-16 NOTE — ED Provider Notes (Signed)
CSN: [id number]     Arrival date & time 03/16/14  0919 History   First MD Initiated Contact with Patient 03/16/14 1012     Chief Complaint  Patient presents with  . Shortness of Breath  . Rash     (Consider location/radiation/quality/duration/timing/severity/associated sxs/prior Treatment) The history is provided by the patient.   Nathan [name] is a 44 y.o. male hx of sarcoidosis here with shortness of breath, rash. Intermittent shortness of breath for the last several weeks. He was diagnosed with sarcoidosis several years ago and was supposed to be on chronic steroids but didn't have insurance so hasn't been taking them. Progressively worsening shortness of breath for the last several weeks. Shortness of breath is worse when he exerts himself. He has some nonproductive cough but no fever. Also noticed some itchy rash on his arm and legs for the last several days and took Benadryl with minimal relief. Denies starting any new medicines or shampoos. No hx of PE or MI.   Past Medical History  Diagnosis Date  . Asthma     'I think I might have asthma"  . Sarcoidosis    History reviewed. No pertinent past surgical history. Family History  Problem Relation Age of Onset  . Heart disease Maternal Grandfather   . Heart disease Paternal Uncle   . Prostate cancer Paternal Uncle    History  Substance Use Topics  . Smoking status: Former Smoker -- 0.80 packs/day for 20 years    Types: Cigarettes    Quit date: 01/13/2011  . Smokeless tobacco: Never Used  . Alcohol Use: No    Review of Systems  Respiratory: Positive for shortness of breath.   Skin: Positive for rash.  All other systems reviewed and are negative.     Allergies  Percocet  Home Medications   Prior to Admission medications   Medication Sig Start Date End Date Taking? Authorizing Provider  aspirin EC 81 MG tablet Take 81 mg by mouth daily.   Yes Historical Provider, MD  Aspirin-Caffeine (BAYER BACK & BODY PAIN  EX ST) 500-32.5 MG TABS Take 2 tablets by mouth every 6 (six) hours as needed (pain).   Yes Historical Provider, MD  cyclobenzaprine (FLEXERIL) 10 MG tablet Take 1 tablet (10 mg total) by mouth 2 (two) times daily as needed for muscle spasms. Patient not taking: Reported on 03/16/2014 05/05/13   Leota Jacobsen, MD  HYDROcodone-acetaminophen [facility]) 5-325 MG per tablet Take 1-2 tablets by mouth every 6 (six) hours as needed for moderate pain. Patient not taking: Reported on 03/16/2014 05/11/13   Margarita Mail, PA-C  oxyCODONE-acetaminophen (PERCOCET/ROXICET) 5-325 MG per tablet Take 1-2 tablets by mouth every 6 (six) hours as needed for severe pain. Patient not taking: Reported on 03/16/2014 05/01/13   Jasper Riling. Pickering, MD  predniSONE (DELTASONE) 20 MG tablet Take 2 tablets (40 mg total) by mouth daily. Take 40 mg by mouth daily for 3 days, then 20mg  by mouth daily for 3 days, then 10mg  daily for 3 days Patient not taking: Reported on 03/16/2014 06/21/13   Larene Pickett, PA-C  promethazine-codeine (PHENERGAN WITH CODEINE) 6.25-10 MG/5ML syrup Take 5 mLs by mouth every 6 (six) hours as needed for cough. Patient not taking: Reported on 03/16/2014 06/21/13   Larene Pickett, PA-C  traMADol-acetaminophen (ULTRACET) 37.5-325 MG per tablet Take 1 tablet by mouth every 6 (six) hours as needed. Patient not taking: Reported on 03/16/2014 06/21/13   Larene Pickett, PA-C   BP  114/79 mmHg  Pulse 68  Temp(Src) 98 F (36.7 C) (Oral)  Resp 14  Ht 5\' 7"  (1.702 m)  Wt 230 lb (104.327 kg)  BMI 36.01 kg/m2  SpO2 96% Physical Exam  Constitutional: He appears well-developed and well-nourished.  HENT:  Head: Normocephalic.  Mouth/Throat: Oropharynx is clear and moist.  Eyes: Conjunctivae are normal. Pupils are equal, round, and reactive to light.  Neck: Normal range of motion.  Cardiovascular: Normal rate, regular rhythm and normal heart sounds.   Pulmonary/Chest: Effort normal.  Mild bibasilar crackles   Abdominal: Soft.  Bowel sounds are normal. He exhibits no distension. There is no tenderness. There is no rebound.  Musculoskeletal: Normal range of motion. He exhibits no edema or tenderness.  Neurological: He is alert. No cranial nerve deficit. Coordination normal.  Skin: Skin is warm and dry.  No obvious rashes   Psychiatric: He has a normal mood and affect. His behavior is normal. Judgment and thought content normal.  Nursing note and vitals reviewed.   ED Course  Procedures (including critical care time) Labs Review Labs Reviewed  CBC WITH DIFFERENTIAL/PLATELET - Abnormal; Notable for the following:    Eosinophils Relative 7 (*)    All other components within normal limits  COMPREHENSIVE METABOLIC PANEL - Abnormal; Notable for the following:    Glucose, Bld 117 (*)    AST 62 (*)    All other components within normal limits  I-STAT TROPOININ, ED    Imaging Review Dg Chest 2 View (if Patient Has Fever And/or Copd)  03/16/2014   CLINICAL DATA:  Shortness of breath and rash. Recent diagnosis of sarcoidosis  EXAM: CHEST  2 VIEW  COMPARISON:  06/21/2013  FINDINGS: Stable bilateral hilar and right peritracheal lymphadenopathy. Heart size and aortic contours remain stable. There are patchy coarse interstitial opacities in the bilateral lungs consistent with pulmonary sarcoidosis. Given mildly lower lung volumes, no definite progression. No superimposed pneumonia or edema suspected.  IMPRESSION: Nodal and pulmonary sarcoidosis.  No acute superimposed findings.   Electronically Signed   By: Jorje Guild M.D.   On: 03/16/2014 10:21     EKG Interpretation   Date/Time:  Monday March 16 2014 12:41:07 EST Ventricular Rate:  68 PR Interval:  216 QRS Duration: 100 QT Interval:  424 QTC Calculation: 451 R Axis:   58 Text Interpretation:  Sinus rhythm Prolonged PR interval No significant  change since last tracing Confirmed by Levone Otten  MD, Aiyla Baucom ([id number]) on 03/16/2014  12:46:29 PM      MDM   Final  diagnoses:  None   Nathan [name] is a 44 y.o. male here with SOB, rash. I see no rash currently. I think likely sarcoid exacerbation. Will check labs, CXR, EKG. I doubt PE or ACS. Will give steroids.   12:54 PM Labs unremarkable. CXR showed sarcoidosis. Given steroids. Will dc with course of steroids. Likely sarcoid exacerbation.    Wandra Arthurs, MD 03/16/14 1255

## 2014-03-16 NOTE — ED Notes (Signed)
MD at bedside. EDP YAO PRESENT TO EVALUATE THIS PT

## 2014-04-10 ENCOUNTER — Emergency Department (HOSPITAL_COMMUNITY)
Admission: EM | Admit: 2014-04-10 | Discharge: 2014-04-10 | Disposition: A | Discharge status: Home / Self Care | Payer: Medicaid Other | Attending: Emergency Medicine | Admitting: Emergency Medicine

## 2014-04-10 ENCOUNTER — Encounter (HOSPITAL_COMMUNITY): Payer: Self-pay | Admitting: Emergency Medicine

## 2014-04-10 ENCOUNTER — Emergency Department (HOSPITAL_COMMUNITY): Payer: Medicaid Other

## 2014-04-10 DIAGNOSIS — Z7982 Long term (current) use of aspirin: Secondary | ICD-10-CM | POA: Insufficient documentation

## 2014-04-10 DIAGNOSIS — J45901 Unspecified asthma with (acute) exacerbation: Secondary | ICD-10-CM | POA: Insufficient documentation

## 2014-04-10 DIAGNOSIS — D869 Sarcoidosis, unspecified: Secondary | ICD-10-CM

## 2014-04-10 DIAGNOSIS — R0602 Shortness of breath: Secondary | ICD-10-CM

## 2014-04-10 DIAGNOSIS — Z7952 Long term (current) use of systemic steroids: Secondary | ICD-10-CM | POA: Insufficient documentation

## 2014-04-10 DIAGNOSIS — G8929 Other chronic pain: Secondary | ICD-10-CM | POA: Insufficient documentation

## 2014-04-10 DIAGNOSIS — R0782 Intercostal pain: Secondary | ICD-10-CM | POA: Insufficient documentation

## 2014-04-10 DIAGNOSIS — Z87891 Personal history of nicotine dependence: Secondary | ICD-10-CM | POA: Insufficient documentation

## 2014-04-10 DIAGNOSIS — M546 Pain in thoracic spine: Secondary | ICD-10-CM | POA: Insufficient documentation

## 2014-04-10 HISTORY — DX: Dorsalgia, unspecified: M54.9

## 2014-04-10 HISTORY — DX: Other chronic pain: G89.29

## 2014-04-10 HISTORY — DX: Dyspnea, unspecified: R06.00

## 2014-04-10 LAB — BRAIN NATRIURETIC PEPTIDE: B Natriuretic Peptide: 18.2 pg/mL (ref 0.0–100.0)

## 2014-04-10 LAB — BASIC METABOLIC PANEL
Anion gap: 8 (ref 5–15)
BUN: 18 mg/dL (ref 6–23)
CO2: 27 mmol/L (ref 19–32)
Calcium: 8.8 mg/dL (ref 8.4–10.5)
Chloride: 105 mmol/L (ref 96–112)
Creatinine, Ser: 0.7 mg/dL (ref 0.50–1.35)
GFR calc Af Amer: >90 mL/min (ref >90)
GFR calc non Af Amer: >90 mL/min (ref >90)
Glucose, Bld: 124 mg/dL — ABNORMAL HIGH (ref 70–99)
Potassium: 3.3 mmol/L — ABNORMAL LOW (ref 3.5–5.1)
Sodium: 140 mmol/L (ref 135–145)

## 2014-04-10 LAB — CBC
HCT: 40.8 % (ref 39.0–52.0)
Hemoglobin: 13.5 g/dL (ref 13.0–17.0)
MCH: 28.1 pg (ref 26.0–34.0)
MCHC: 33.1 g/dL (ref 30.0–36.0)
MCV: 84.8 fL (ref 78.0–100.0)
Platelets: 254 10*3/uL (ref 150–400)
RBC: 4.81 MIL/uL (ref 4.22–5.81)
RDW: 13.4 % (ref 11.5–15.5)
WBC: 4.7 10*3/uL (ref 4.0–10.5)

## 2014-04-10 LAB — I-STAT TROPONIN, ED
Troponin i, poc: 0 ng/mL (ref 0.00–0.08)
Troponin i, poc: 0 ng/mL (ref 0.00–0.08)

## 2014-04-10 MED ORDER — ASPIRIN 325 MG PO TABS
325.0000 mg | ORAL_TABLET | Freq: Once | ORAL | Status: AC
  Administered 2014-04-10: 325 mg via ORAL
  Filled 2014-04-10: qty 1

## 2014-04-10 MED ORDER — POTASSIUM CHLORIDE CRYS ER 20 MEQ PO TBCR
60.0000 meq | EXTENDED_RELEASE_TABLET | Freq: Once | ORAL | Status: AC
  Administered 2014-04-10: 60 meq via ORAL
  Filled 2014-04-10: qty 3

## 2014-04-10 MED ORDER — NAPROXEN 500 MG PO TABS: 500.0000 mg | ORAL_TABLET | Freq: Two times a day (BID) | ORAL | Status: DC | PRN

## 2014-04-10 MED ORDER — POTASSIUM CHLORIDE CRYS ER 20 MEQ PO TBCR
60.0000 meq | EXTENDED_RELEASE_TABLET | Freq: Once | ORAL | Status: DC
  Filled 2014-04-10: qty 3

## 2014-04-10 NOTE — Progress Notes (Signed)
  CARE MANAGEMENT ED NOTE 04/10/2014  Patient:  Nathan [name], Nathan [name]   Account Number:  [id number]  Date Initiated:  04/10/2014  Documentation initiated by:  Jackelyn Poling  Subjective/Objective Assessment:   44 yr old self pay  [place] pt c/o sob PMHx of asthma sarcoidosis, who presents to the ED with complaints of chronic back pain which developed into left-sided intermittent chest pain this morning while he was walking to the bus     Subjective/Objective Assessment Detail:   no pcp listed in EPIC     Action/Plan:   spoke with pt;  updated epic;   Action/Plan Detail:   Anticipated DC Date:  04/10/2014     Status Recommendation to Physician:   Result of Recommendation:    Other ED Services  Consult Working [place]  Other  Outpatient Services - Pt will follow up  PCP issues    Choice offered to / List presented to:            Status of service:  Completed, signed off  ED Comments:   ED Comments Detail:

## 2014-04-10 NOTE — ED Notes (Signed)
Pt ambulated to restroom with steady gait.

## 2014-04-10 NOTE — ED Notes (Signed)
Awaiting Potassium to arrive from pharmacy or be reloaded into pyxis.

## 2014-04-10 NOTE — ED Notes (Signed)
Pt alert,oriented, and ambulatory upon DC. 

## 2014-04-10 NOTE — ED Notes (Signed)
Radiology at bedside

## 2014-04-10 NOTE — ED Notes (Signed)
PA at bedside.

## 2014-04-10 NOTE — ED Notes (Signed)
Pt states that he was seen recently at his doctor's office and given a dx of sarcoidosis.  Pt states that at the doctors's office his oxygen levels were fine at rest but started to drop as he was walking.  Pt states "I guess she should have given me a pump or something".  Pt states that on his way to work this morning, he began having lt sided CP and SOB.  Denies NVD.  Denies LOC.

## 2014-04-10 NOTE — ED Provider Notes (Signed)
CSN: [id number]     Arrival date & time 04/10/14  0745 History   First MD Initiated Contact with Patient 04/10/14 [id number]     Chief Complaint  Patient presents with  . Shortness of Breath     (Consider location/radiation/quality/duration/timing/severity/associated sxs/prior Treatment) HPI Comments: Nathan [name] is a 44 y.o. male with a PMHx of asthma sarcoidosis, who presents to the ED with complaints of chronic back pain which developed into left-sided intermittent chest pain this morning while he was walking to the bus at around 6:30 AM. He reports gradual onset 10/10 intermittent sharp nonradiating left-sided chest pain seemed to be coming from his back, worse with movement, with no medications prior to arrival. He states that at this time it is resolving. Endorses chronic shortness of breath for which he is on a course of prednisone given by his primary , but he did not take this morning's dose yet. Reports chronic intermittent wheezing which is currently resolved, and a chronic cough with clear sputum production which is unchanged from baseline. Denies any fevers, chills, diaphoresis, dizziness, lightheadedness, headache, vision changes, syncope, abdominal pain, nausea, vomiting, diarrhea or constipation, dysuria, hematuria, numbness, tingling, weakness, jaw or neck pain, claudication, PND, lower extremity swelling, history of DVT or PE, or smoking/tobacco use. Had similar symptoms 3wks ago, seen in ED, told to follow up with PCP who has placed him on prednisone again and overall he feels improved but this morning he was slightly worried when his chronic back pain became chest pain.   Patient is a 44 y.o. male presenting with shortness of breath. The history is provided by the patient. No language interpreter was used.  Shortness of Breath Severity:  Mild Onset quality:  Gradual Timing:  Intermittent Progression:  Unchanged Chronicity:  Chronic Context: activity   Relieved by:   Rest Worsened by:  Activity Ineffective treatments:  None tried Associated symptoms: chest pain (L sided), cough (chronic, unchanged, improving overall), sputum production (clear) and wheezing (intermittent, unchanged)   Associated symptoms: no abdominal pain, no claudication, no diaphoresis, no ear pain, no fever, no headaches, no hemoptysis, no neck pain, no PND, no rash, no sore throat, no swollen glands and no vomiting   Risk factors: no family hx of DVT, no hx of PE/DVT, no prolonged immobilization, no recent surgery and no tobacco use     Past Medical History  Diagnosis Date  . Asthma     'I think I might have asthma"  . Sarcoidosis   . Dyspnea     chronic  . Chronic back pain    History reviewed. No pertinent past surgical history. Family History  Problem Relation Age of Onset  . Heart disease Maternal Grandfather   . Heart disease Paternal Uncle   . Prostate cancer Paternal Uncle    History  Substance Use Topics  . Smoking status: Former Smoker -- 0.80 packs/day for 20 years    Types: Cigarettes    Quit date: 01/13/2011  . Smokeless tobacco: Never Used  . Alcohol Use: No    Review of Systems  Constitutional: Negative for fever, chills and diaphoresis.  HENT: Negative for ear pain, rhinorrhea and sore throat.   Eyes: Negative for visual disturbance.  Respiratory: Positive for cough (chronic, unchanged, improving overall), sputum production (clear), shortness of breath and wheezing (intermittent, unchanged). Negative for hemoptysis.   Cardiovascular: Positive for chest pain (L sided). Negative for palpitations, claudication, leg swelling and PND.  Gastrointestinal: Negative for nausea, vomiting, abdominal pain, diarrhea and  constipation.  Genitourinary: Negative for dysuria, hematuria and flank pain.  Musculoskeletal: Positive for back pain (chronic unchanged, thoracic). Negative for myalgias, arthralgias, neck pain and neck stiffness.  Skin: Negative for rash.   Allergic/Immunologic: Positive for immunocompromised state (on prednisone for Sarcoidosis).  Neurological: Negative for dizziness, weakness, light-headedness, numbness and headaches.  Psychiatric/Behavioral: Negative for confusion.   10 Systems reviewed and are negative for acute change except as noted in the HPI.    Allergies  Percocet  Home Medications   Prior to Admission medications   Medication Sig Start Date End Date Taking? Authorizing Provider  aspirin EC 81 MG tablet Take 81 mg by mouth daily.    Historical Provider, MD  Aspirin-Caffeine (BAYER BACK & BODY PAIN EX ST) 500-32.5 MG TABS Take 2 tablets by mouth every 6 (six) hours as needed (pain).    Historical Provider, MD  cyclobenzaprine (FLEXERIL) 10 MG tablet Take 1 tablet (10 mg total) by mouth 2 (two) times daily as needed for muscle spasms. Patient not taking: Reported on 03/16/2014 05/05/13   Leota Jacobsen, MD  HYDROcodone-acetaminophen [facility]) 5-325 MG per tablet Take 1-2 tablets by mouth every 6 (six) hours as needed for moderate pain. Patient not taking: Reported on 03/16/2014 05/11/13   Margarita Mail, PA-C  oxyCODONE-acetaminophen (PERCOCET/ROXICET) 5-325 MG per tablet Take 1-2 tablets by mouth every 6 (six) hours as needed for severe pain. Patient not taking: Reported on 03/16/2014 05/01/13   Jasper Riling. Pickering, MD  predniSONE (DELTASONE) 20 MG tablet Take 2 tablets (40 mg total) by mouth daily. Take 40 mg by mouth daily for 3 days, then 20mg  by mouth daily for 3 days, then 10mg  daily for 3 days 03/16/14   Wandra Arthurs, MD  promethazine-codeine [facility] WITH CODEINE) 6.25-10 MG/5ML syrup Take 5 mLs by mouth every 6 (six) hours as needed for cough. Patient not taking: Reported on 03/16/2014 06/21/13   Larene Pickett, PA-C  traMADol-acetaminophen (ULTRACET) 37.5-325 MG per tablet Take 1 tablet by mouth every 6 (six) hours as needed. Patient not taking: Reported on 03/16/2014 06/21/13   Larene Pickett, PA-C   BP 148/91 mmHg  Pulse  98  Temp(Src) 98.1 F (36.7 C) (Oral)  Resp 18  SpO2 97% Physical Exam  Constitutional: He is oriented to person, place, and time. Vital signs are normal. He appears well-developed and well-nourished.  Non-toxic appearance. No distress.  Afebrile, nontoxic, NAD, VSS  HENT:  Head: Normocephalic and atraumatic.  Mouth/Throat: Oropharynx is clear and moist and mucous membranes are normal.  Eyes: Conjunctivae and EOM are normal. Right eye exhibits no discharge. Left eye exhibits no discharge.  Neck: Normal range of motion. Neck supple. No JVD present.  Cardiovascular: Normal rate, regular rhythm, normal heart sounds and intact distal pulses.  Exam reveals no gallop and no friction rub.   No murmur heard. RRR, nl s1/s2, no m/r/g, distal pulses intact, no pedal edema  Pulmonary/Chest: Effort normal and breath sounds normal. No respiratory distress. He has no decreased breath sounds. He has no wheezes. He has no rhonchi. He has no rales. He exhibits tenderness. He exhibits no crepitus, no deformity and no retraction.    CTAB in all lung fields, no w/r/r, no hypoxia or increased WOB, speaking in full sentences, SpO2 97% on RA  Mild L sided chest wall TTP, no crepitus or deformity  Abdominal: Soft. Normal appearance and bowel sounds are normal. He exhibits no distension. There is no tenderness. There is no rigidity, no rebound,  no guarding, no CVA tenderness, no tenderness at McBurney's point and negative Murphy's sign.  Musculoskeletal: Normal range of motion.       Thoracic back: He exhibits tenderness. He exhibits normal range of motion, no bony tenderness and no spasm.       Back:  MAE x4 Strength and sensation grossly intact Distal pulses intact No pedal edema, neg homan's bilaterally All spinal levels nonTTP in midline, no bony step offs or deformities. Mild thoracic paraspinous muscle TTP without spasm.  Neurological: He is alert and oriented to person, place, and time. He has normal  strength. No sensory deficit.  Skin: Skin is warm, dry and intact. No rash noted.  Psychiatric: He has a normal mood and affect.  Nursing note and vitals reviewed.   ED Course  Procedures (including critical care time) Labs Review Labs Reviewed  BASIC METABOLIC PANEL - Abnormal; Notable for the following:    Potassium 3.3 (*)    Glucose, Bld 124 (*)    All other components within normal limits  CBC  BRAIN NATRIURETIC PEPTIDE  I-STAT TROPOININ, ED  Randolm Idol, ED    Imaging Review Dg Chest Port 1 View  04/10/2014   CLINICAL DATA:  Chest pain and difficulty breathing, acute. History of sarcoidosis  EXAM: PORTABLE CHEST - 1 VIEW  COMPARISON:  March 16, 2014  FINDINGS: Bilateral hilar and right peritracheal adenopathy are again noted without change. Patchy interstitial prominence in both lower lobes and in the left upper lobe region remain. No new opacity. No frank airspace consolidation. The heart size is normal. The pulmonary vascular is normal. No bone lesions.  IMPRESSION: Changes consistent with sarcoidosis as described. No new opacity. No change in cardiac silhouette. Stable adenopathy.   Electronically Signed   By: Lowella Grip III M.D.   On: 04/10/2014 08:14     EKG Interpretation   Date/Time:  Friday April 10 2014 07:50:53 EST Ventricular Rate:  75 PR Interval:  181 QRS Duration: 104 QT Interval:  376 QTC Calculation: 420 R Axis:   61 Text Interpretation:  Sinus rhythm Baseline wander When compared with ECG  of 03/16/2014 No significant change was found Confirmed by [facility]  MD,  Nunzio Cory [phone]) on 04/10/2014 8:17:04 AM      MDM   Final diagnoses:  SOB (shortness of breath)  Sarcoidosis  Intercostal pain    44 y.o. male with chronic back pain that became L chest pain when he was walking to the bus, intermittent, reproducible on exam. Similar to prior visits. Hx of sarcoidosis, on prednisone. Clear lung exam. Doubt PE/DVT/Dissection. Will obtain  labs, CXR, EKG, and reassess. Will give ASA but doubt need for NTG or morphine. Will reassess shortly.   10:25 AM Pain completely resolved. VSS. Trop neg. BNP WNL. CBC WNL. BMP showing mildly low potassium, repleted here. CXR showing stable sarcoidosis changes, no acute findings. EKG unchanged from prior. Will get second troponin but I believe this is musculoskeletal pain worsened by his baseline sarcoid, which he's being treated for with prednisone. Will monitor and reassess after troponin.   11:47 AM  Second trop neg. No ongoing pain or symptoms. Feels well. Will f/up with PCP in 1wk, and see pulmonologist for further management of his sarcoid. I explained the diagnosis and have given explicit precautions to return to the ER including for any other new or worsening symptoms. The patient understands and accepts the medical plan as it's been dictated and I have answered their questions. Discharge instructions concerning  home care and prescriptions have been given. The patient is STABLE and is discharged to home in good condition.  BP 117/80 mmHg  Pulse 66  Temp(Src) 98 F (36.7 C) (Oral)  Resp 17  SpO2 96%  Meds ordered this encounter  Medications  . aspirin tablet 325 mg    Sig:   . potassium chloride SA (K-DUR,KLOR-CON) CR tablet 60 mEq    Sig:   . naproxen (NAPROSYN) 500 MG tablet    Sig: Take 1 tablet (500 mg total) by mouth 2 (two) times daily as needed for mild pain, moderate pain or headache (TAKE WITH MEALS.).    Dispense:  20 tablet    Refill:  0    Order Specific Question:  Supervising Provider    Answer:  Johnna Acosta [[id number]]     [place], PA-C 04/10/14 [place], [place] 04/13/14 1109

## 2014-04-10 NOTE — ED Notes (Signed)
Called pharmacy to check status if K+ refill. They report tech is on the way to refill pyxis.

## 2014-04-10 NOTE — Discharge Instructions (Signed)
Use naprosyn as needed for pain. Use heat over the affected area for additional relief. See your regular doctor in 1 week for recheck. Continue your usual home medications. Return to the ER for changes or worsening symptoms.   Chest Wall Pain Chest wall pain is pain felt in or around the chest bones and muscles. It may take up to 6 weeks to get better. It may take longer if you are active. Chest wall pain can happen on its own. Other times, things like germs, injury, coughing, or exercise can cause the pain. HOME CARE   Avoid activities that make you tired or cause pain. Try not to use your chest, belly (abdominal), or side muscles. Do not use heavy weights.  Put ice on the sore area.  Put ice in a plastic bag.  Place a towel between your skin and the bag.  Leave the ice on for 15-20 minutes for the first 2 days.  Only take medicine as told by your doctor. GET HELP RIGHT AWAY IF:   You have more pain or are very uncomfortable.  You have a fever.  Your chest pain gets worse.  You have new problems.  You feel sick to your stomach (nauseous) or throw up (vomit).  You start to sweat or feel lightheaded.  You have a cough with mucus (phlegm).  You cough up blood. MAKE SURE YOU:   Understand these instructions.  Will watch your condition.  Will get help right away if you are not doing well or get worse. Document Released: 07/19/2007 Document Revised: 04/24/2011 Document Reviewed: 09/26/2010 [facility] Patient Information 2015 [place], [place]. This information is not intended to replace advice given to you by your health care provider. Make sure you discuss any questions you have with your health care provider.  Shortness of Breath Shortness of breath means you have trouble breathing. It could also mean that you have a medical problem. You should get immediate medical care for shortness of breath. CAUSES   Not enough oxygen in the air such as with high altitudes or a  smoke-filled room.  Certain lung diseases, infections, or problems.  Heart disease or conditions, such as angina or heart failure.  Low red blood cells (anemia).  Poor physical fitness, which can cause shortness of breath when you exercise.  Chest or back injuries or stiffness.  Being overweight.  Smoking.  Anxiety, which can make you feel like you are not getting enough air. DIAGNOSIS  Serious medical problems can often be found during your physical exam. Tests may also be done to determine why you are having shortness of breath. Tests may include:  Chest X-rays.  Lung function tests.  Blood tests.  An electrocardiogram (ECG).  An ambulatory electrocardiogram. An ambulatory ECG records your heartbeat patterns over a 24-hour period.  Exercise testing.  A transthoracic echocardiogram (TTE). During echocardiography, sound waves are used to evaluate how blood flows through your heart.  A transesophageal echocardiogram (TEE).  Imaging scans. Your health care provider may not be able to find a cause for your shortness of breath after your exam. In this case, it is important to have a follow-up exam with your health care provider as directed.  TREATMENT  Treatment for shortness of breath depends on the cause of your symptoms and can vary greatly. HOME CARE INSTRUCTIONS   Do not smoke. Smoking is a common cause of shortness of breath. If you smoke, ask for help to quit.  Avoid being around chemicals or things that  may bother your breathing, such as paint fumes and dust.  Rest as needed. Slowly resume your usual activities.  If medicines were prescribed, take them as directed for the full length of time directed. This includes oxygen and any inhaled medicines.  Keep all follow-up appointments as directed by your health care provider. SEEK MEDICAL CARE IF:   Your condition does not improve in the time expected.  You have a hard time doing your normal activities even with  rest.  You have any new symptoms. SEEK IMMEDIATE MEDICAL CARE IF:   Your shortness of breath gets worse.  You feel light-headed, faint, or develop a cough not controlled with medicines.  You start coughing up blood.  You have pain with breathing.  You have chest pain or pain in your arms, shoulders, or abdomen.  You have a fever.  You are unable to walk up stairs or exercise the way you normally do. MAKE SURE YOU:  Understand these instructions.  Will watch your condition.  Will get help right away if you are not doing well or get worse. Document Released: 10/25/2000 Document Revised: 02/04/2013 Document Reviewed: 04/17/2011 [facility] Patient Information 2015 [place], [place]. This information is not intended to replace advice given to you by your health care provider. Make sure you discuss any questions you have with your health care provider.

## 2014-05-29 ENCOUNTER — Encounter (HOSPITAL_COMMUNITY): Payer: Self-pay | Admitting: *Deleted

## 2014-05-29 ENCOUNTER — Emergency Department (HOSPITAL_COMMUNITY)
Admission: EM | Admit: 2014-05-29 | Discharge: 2014-05-29 | Disposition: A | Discharge status: Home / Self Care | Payer: Medicaid Other | Attending: Emergency Medicine | Admitting: Emergency Medicine

## 2014-05-29 DIAGNOSIS — Y9389 Activity, other specified: Secondary | ICD-10-CM | POA: Insufficient documentation

## 2014-05-29 DIAGNOSIS — Z79899 Other long term (current) drug therapy: Secondary | ICD-10-CM | POA: Insufficient documentation

## 2014-05-29 DIAGNOSIS — Y990 Civilian activity done for income or pay: Secondary | ICD-10-CM | POA: Insufficient documentation

## 2014-05-29 DIAGNOSIS — Z862 Personal history of diseases of the blood and blood-forming organs and certain disorders involving the immune mechanism: Secondary | ICD-10-CM | POA: Insufficient documentation

## 2014-05-29 DIAGNOSIS — Z7952 Long term (current) use of systemic steroids: Secondary | ICD-10-CM | POA: Insufficient documentation

## 2014-05-29 DIAGNOSIS — G8929 Other chronic pain: Secondary | ICD-10-CM | POA: Insufficient documentation

## 2014-05-29 DIAGNOSIS — J45909 Unspecified asthma, uncomplicated: Secondary | ICD-10-CM | POA: Insufficient documentation

## 2014-05-29 DIAGNOSIS — Z7982 Long term (current) use of aspirin: Secondary | ICD-10-CM | POA: Insufficient documentation

## 2014-05-29 DIAGNOSIS — Y9289 Other specified places as the place of occurrence of the external cause: Secondary | ICD-10-CM | POA: Insufficient documentation

## 2014-05-29 DIAGNOSIS — X58XXXA Exposure to other specified factors, initial encounter: Secondary | ICD-10-CM | POA: Insufficient documentation

## 2014-05-29 DIAGNOSIS — S39012A Strain of muscle, fascia and tendon of lower back, initial encounter: Secondary | ICD-10-CM

## 2014-05-29 MED ORDER — NAPROXEN 500 MG PO TABS: 500.0000 mg | ORAL_TABLET | Freq: Two times a day (BID) | ORAL | Status: DC

## 2014-05-29 MED ORDER — CYCLOBENZAPRINE HCL 10 MG PO TABS: 10.0000 mg | ORAL_TABLET | Freq: Two times a day (BID) | ORAL | Status: DC | PRN

## 2014-05-29 MED ORDER — LIDOCAINE 5 % EX PTCH: 1.0000 | MEDICATED_PATCH | CUTANEOUS | Status: DC

## 2014-05-29 NOTE — Discharge Instructions (Signed)
Muscle Strain A muscle strain is an injury that occurs when a muscle is stretched beyond its normal length. Usually a small number of muscle fibers are torn when this happens. Muscle strain is rated in degrees. First-degree strains have the least amount of muscle fiber tearing and pain. Second-degree and third-degree strains have increasingly more tearing and pain.  Usually, recovery from muscle strain takes 1-2 weeks. Complete healing takes 5-6 weeks.  CAUSES  Muscle strain happens when a sudden, violent force placed on a muscle stretches it too far. This may occur with lifting, sports, or a fall.  RISK FACTORS Muscle strain is especially common in athletes.  SIGNS AND SYMPTOMS At the site of the muscle strain, there may be:  Pain.  Bruising.  Swelling.  Difficulty using the muscle due to pain or lack of normal function. DIAGNOSIS  Your health care provider will perform a physical exam and ask about your medical history. TREATMENT  Often, the best treatment for a muscle strain is resting, icing, and applying cold compresses to the injured area.  HOME CARE INSTRUCTIONS   Use the PRICE method of treatment to promote muscle healing during the first 2-3 days after your injury. The PRICE method involves:  Protecting the muscle from being injured again.  Restricting your activity and resting the injured body part.  Icing your injury. To do this, put ice in a plastic bag. Place a towel between your skin and the bag. Then, apply the ice and leave it on from 15-20 minutes each hour. After the third day, switch to moist heat packs.  Apply compression to the injured area with a splint or elastic bandage. Be careful not to wrap it too tightly. This may interfere with blood circulation or increase swelling.  Elevate the injured body part above the level of your heart as often as you can.  Only take over-the-counter or prescription medicines for pain, discomfort, or fever as directed by your  health care provider.  Warming up prior to exercise helps to prevent future muscle strains. SEEK MEDICAL CARE IF:   You have increasing pain or swelling in the injured area.  You have numbness, tingling, or a significant loss of strength in the injured area. MAKE SURE YOU:   Understand these instructions.  Will watch your condition.  Will get help right away if you are not doing well or get worse. Document Released: 01/30/2005 Document Revised: 11/20/2012 Document Reviewed: 08/29/2012 [facility] Patient Information 2015 [place], [place]. This information is not intended to replace advice given to you by your health care provider. Make sure you discuss any questions you have with your health care provider.  Cryotherapy Cryotherapy means treatment with cold. Ice or gel packs can be used to reduce both pain and swelling. Ice is the most helpful within the first 24 to 48 hours after an injury or flare-up from overusing a muscle or joint. Sprains, strains, spasms, burning pain, shooting pain, and aches can all be eased with ice. Ice can also be used when recovering from surgery. Ice is effective, has very few side effects, and is safe for most people to use. PRECAUTIONS  Ice is not a safe treatment option for people with:  Raynaud phenomenon. This is a condition affecting small blood vessels in the extremities. Exposure to cold may cause your problems to return.  Cold hypersensitivity. There are many forms of cold hypersensitivity, including:  Cold urticaria. Red, itchy hives appear on the skin when the tissues begin to warm after  being iced.  Cold erythema. This is a red, itchy rash caused by exposure to cold.  Cold hemoglobinuria. Red blood cells break down when the tissues begin to warm after being iced. The hemoglobin that carry oxygen are passed into the urine because they cannot combine with blood proteins fast enough.  Numbness or altered sensitivity in the area being iced. If you  have any of the following conditions, do not use ice until you have discussed cryotherapy with your caregiver:  Heart conditions, such as arrhythmia, angina, or chronic heart disease.  High blood pressure.  Healing wounds or open skin in the area being iced.  Current infections.  Rheumatoid arthritis.  Poor circulation.  Diabetes. Ice slows the blood flow in the region it is applied. This is beneficial when trying to stop inflamed tissues from spreading irritating chemicals to surrounding tissues. However, if you expose your skin to cold temperatures for too long or without the proper protection, you can damage your skin or nerves. Watch for signs of skin damage due to cold. HOME CARE INSTRUCTIONS Follow these tips to use ice and cold packs safely.  Place a dry or damp towel between the ice and skin. A damp towel will cool the skin more quickly, so you may need to shorten the time that the ice is used.  For a more rapid response, add gentle compression to the ice.  Ice for no more than 10 to 20 minutes at a time. The bonier the area you are icing, the less time it will take to get the benefits of ice.  Check your skin after 5 minutes to make sure there are no signs of a poor response to cold or skin damage.  Rest 20 minutes or more between uses.  Once your skin is numb, you can end your treatment. You can test numbness by very lightly touching your skin. The touch should be so light that you do not see the skin dimple from the pressure of your fingertip. When using ice, most people will feel these normal sensations in this order: cold, burning, aching, and numbness.  Do not use ice on someone who cannot communicate their responses to pain, such as small children or people with dementia. HOW TO MAKE AN ICE PACK Ice packs are the most common way to use ice therapy. Other methods include ice massage, ice baths, and cryosprays. Muscle creams that cause a cold, tingly feeling do not offer  the same benefits that ice offers and should not be used as a substitute unless recommended by your caregiver. To make an ice pack, do one of the following:  Place crushed ice or a bag of frozen vegetables in a sealable plastic bag. Squeeze out the excess air. Place this bag inside another plastic bag. Slide the bag into a pillowcase or place a damp towel between your skin and the bag.  Mix 3 parts water with 1 part rubbing alcohol. Freeze the mixture in a sealable plastic bag. When you remove the mixture from the freezer, it will be slushy. Squeeze out the excess air. Place this bag inside another plastic bag. Slide the bag into a pillowcase or place a damp towel between your skin and the bag. SEEK MEDICAL CARE IF:  You develop white spots on your skin. This may give the skin a blotchy (mottled) appearance.  Your skin turns blue or pale.  Your skin becomes waxy or hard.  Your swelling gets worse. MAKE SURE YOU:   Understand these  instructions.  Will watch your condition.  Will get help right away if you are not doing well or get worse. Document Released: 09/26/2010 Document Revised: 06/16/2013 Document Reviewed: 09/26/2010 [facility] Patient Information 2015 [place], [place]. This information is not intended to replace advice given to you by your health care provider. Make sure you discuss any questions you have with your health care provider. Heat Therapy Heat therapy can help ease sore, stiff, injured, and tight muscles and joints. Heat relaxes your muscles, which may help ease your pain.  RISKS AND COMPLICATIONS If you have any of the following conditions, do not use heat therapy unless your health care provider has approved:  Poor circulation.  Healing wounds or scarred skin in the area being treated.  Diabetes, heart disease, or high blood pressure.  Not being able to feel (numbness) the area being treated.  Unusual swelling of the area being treated.  Active  infections.  Blood clots.  Cancer.  Inability to communicate pain. This may include young children and people who have problems with their brain function (dementia).  Pregnancy. Heat therapy should only be used on old, pre-existing, or long-lasting (chronic) injuries. Do not use heat therapy on new injuries unless directed by your health care provider. HOW TO USE HEAT THERAPY There are several different kinds of heat therapy, including:  Moist heat pack.  Warm water bath.  Hot water bottle.  Electric heating pad.  Heated gel pack.  Heated wrap.  Electric heating pad. Use the heat therapy method suggested by your health care provider. Follow your health care provider's instructions on when and how to use heat therapy. GENERAL HEAT THERAPY RECOMMENDATIONS  Do not sleep while using heat therapy. Only use heat therapy while you are awake.  Your skin may turn pink while using heat therapy. Do not use heat therapy if your skin turns red.  Do not use heat therapy if you have new pain.  High heat or long exposure to heat can cause burns. Be careful when using heat therapy to avoid burning your skin.  Do not use heat therapy on areas of your skin that are already irritated, such as with a rash or sunburn. SEEK MEDICAL CARE IF:  You have blisters, redness, swelling, or numbness.  You have new pain.  Your pain is worse. MAKE SURE YOU:  Understand these instructions.  Will watch your condition.  Will get help right away if you are not doing well or get worse. Document Released: 04/24/2011 Document Revised: 06/16/2013 Document Reviewed: 03/25/2013 [facility] Patient Information 2015 [place], [place]. This information is not intended to replace advice given to you by your health care provider. Make sure you discuss any questions you have with your health care provider.

## 2014-05-29 NOTE — ED Notes (Signed)
Pt states his lower back began hurting yesterday while at work. Pt states the pain gradually got worse throughout the day, pt does not recall a particular injury to his back that would cause the pain.
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
## 2014-07-09 ENCOUNTER — Emergency Department (HOSPITAL_COMMUNITY)
Admission: EM | Admit: 2014-07-09 | Discharge: 2014-07-09 | Disposition: A | Discharge status: Home / Self Care | Payer: Medicaid Other | Attending: Emergency Medicine | Admitting: Emergency Medicine

## 2014-07-09 ENCOUNTER — Encounter (HOSPITAL_COMMUNITY): Payer: Self-pay | Admitting: Emergency Medicine

## 2014-07-09 DIAGNOSIS — J069 Acute upper respiratory infection, unspecified: Secondary | ICD-10-CM | POA: Insufficient documentation

## 2014-07-09 DIAGNOSIS — Z79899 Other long term (current) drug therapy: Secondary | ICD-10-CM | POA: Insufficient documentation

## 2014-07-09 DIAGNOSIS — Z791 Long term (current) use of non-steroidal anti-inflammatories (NSAID): Secondary | ICD-10-CM | POA: Insufficient documentation

## 2014-07-09 DIAGNOSIS — G8929 Other chronic pain: Secondary | ICD-10-CM | POA: Insufficient documentation

## 2014-07-09 DIAGNOSIS — Z862 Personal history of diseases of the blood and blood-forming organs and certain disorders involving the immune mechanism: Secondary | ICD-10-CM | POA: Insufficient documentation

## 2014-07-09 DIAGNOSIS — Z7982 Long term (current) use of aspirin: Secondary | ICD-10-CM | POA: Insufficient documentation

## 2014-07-09 DIAGNOSIS — R21 Rash and other nonspecific skin eruption: Secondary | ICD-10-CM | POA: Insufficient documentation

## 2014-07-09 DIAGNOSIS — J45909 Unspecified asthma, uncomplicated: Secondary | ICD-10-CM | POA: Insufficient documentation

## 2014-07-09 DIAGNOSIS — Z7952 Long term (current) use of systemic steroids: Secondary | ICD-10-CM | POA: Insufficient documentation

## 2014-07-09 DIAGNOSIS — Z87891 Personal history of nicotine dependence: Secondary | ICD-10-CM | POA: Insufficient documentation

## 2014-07-09 LAB — RAPID STREP SCREEN (MED CTR MEBANE ONLY): Streptococcus, Group A Screen (Direct): NEGATIVE

## 2014-07-09 MED ORDER — BENZONATATE 100 MG PO CAPS: 100.0000 mg | ORAL_CAPSULE | Freq: Three times a day (TID) | ORAL | Status: DC

## 2014-07-09 NOTE — ED Notes (Signed)
States sore throat starting Monday. Says his chronic cough from sarcoidosis has been acting up since he began having a sore throat. States he had broken out in a rash on Monday also, and his left eye has been draining

## 2014-07-09 NOTE — Discharge Instructions (Signed)
Upper Respiratory Infection, Adult An upper respiratory infection (URI) is also known as the common cold. It is often caused by a type of germ (virus). Colds are easily spread (contagious). You can pass it to others by kissing, coughing, sneezing, or drinking out of the same glass. Usually, you get better in 1 or 2 weeks.  HOME CARE   Only take medicine as told by your doctor.  Use a warm mist humidifier or breathe in steam from a hot shower.  Drink enough water and fluids to keep your pee (urine) clear or pale yellow.  Get plenty of rest.  Return to work when your temperature is back to normal or as told by your doctor. You may use a face mask and wash your hands to stop your cold from spreading. GET HELP RIGHT AWAY IF:   After the first few days, you feel you are getting worse.  You have questions about your medicine.  You have chills, shortness of breath, or brown or red spit (mucus).  You have yellow or brown snot (nasal discharge) or pain in the face, especially when you bend forward.  You have a fever, puffy (swollen) neck, pain when you swallow, or white spots in the back of your throat.  You have a bad headache, ear pain, sinus pain, or chest pain.  You have a high-pitched whistling sound when you breathe in and out (wheezing).  You have a lasting cough or cough up blood.  You have sore muscles or a stiff neck. MAKE SURE YOU:   Understand these instructions.  Will watch your condition.  Will get help right away if you are not doing well or get worse. Document Released: 07/19/2007 Document Revised: 04/24/2011 Document Reviewed: 05/07/2013 [employer] Patient Information 2015 [employer]. This information is not intended to replace advice given to you by your health care provider. Make sure you discuss any questions you have with your health care provider.  

## 2014-07-09 NOTE — ED Provider Notes (Signed)
CSN: [id number]     Arrival date & time 07/09/14  1221 History  This chart was scribed for a non-physician practitioner, Ottie Glazier, working with Jola Schmidt, MD by Martinique Peace, ED Scribe. The patient was seen in [facility]. The patient's care was started at 12:41 PM.    Chief Complaint  Patient presents with  . Sore Throat  . Rash  . Cough      Patient is a 44 y.o. male presenting with pharyngitis, rash, and cough. The history is provided by the patient. No language interpreter was used.  Sore Throat Pertinent negatives include no abdominal pain.  Rash Associated symptoms: no abdominal pain and no fever   Cough Associated symptoms: rash   Associated symptoms: no ear pain and no fever   HPI Comments: Nathan [name] is a 44 y.o. male who presents to the Emergency Department complaining of sore throat that started Monday with productive cough (sputum: greenish yellow). Pt also stated that he had a rash to his face and right shoulders that has since resolved after he started taking Benadryl. He reports pain with swallowing but denies any trouble swallowing. He further denies any fever, ear pain, or abdominal pain. Pt's daughter and wife are also sick with the same symptoms. Pt is non-smoker. No history of asthma.  Past Medical History  Diagnosis Date  . Asthma     'I think I might have asthma"  . Sarcoidosis   . Dyspnea     chronic  . Chronic back pain    History reviewed. No pertinent past surgical history. Family History  Problem Relation Age of Onset  . Heart disease Maternal Grandfather   . Heart disease Paternal Uncle   . Prostate cancer Paternal Uncle    History  Substance Use Topics  . Smoking status: Former Smoker -- 0.80 packs/day for 20 years    Types: Cigarettes    Quit date: 01/13/2011  . Smokeless tobacco: Never Used  . Alcohol Use: No    Review of Systems  Constitutional: Negative for fever.  HENT: Negative for ear pain and trouble swallowing.    Respiratory: Positive for cough.   Gastrointestinal: Negative for abdominal pain.  Skin: Positive for rash.      Allergies  Percocet  Home Medications   Prior to Admission medications   Medication Sig Start Date End Date Taking? Authorizing Provider  aspirin EC 81 MG tablet Take 81 mg by mouth daily.    Historical Provider, MD  benzonatate (TESSALON) 100 MG capsule Take 1 capsule (100 mg total) by mouth every 8 (eight) hours. 07/09/14   Baelyn Doring Patel-Mills, PA-C  cyclobenzaprine (FLEXERIL) 10 MG tablet Take 1 tablet (10 mg total) by mouth 2 (two) times daily as needed for muscle spasms. 05/29/14   Margarita Mail, PA-C  HYDROcodone-acetaminophen (NORCO) 5-325 MG per tablet Take 1-2 tablets by mouth every 6 (six) hours as needed for moderate pain. Patient not taking: Reported on 03/16/2014 05/11/13   Margarita Mail, PA-C  lidocaine (LIDODERM) 5 % Place 1 patch onto the skin daily. Remove & Discard patch within 12 hours or as directed by MD 05/29/14   Margarita Mail, PA-C  naproxen (NAPROSYN) 500 MG tablet Take 1 tablet (500 mg total) by mouth 2 (two) times daily with a meal. 05/29/14   Margarita Mail, PA-C  oxyCODONE-acetaminophen (PERCOCET/ROXICET) 5-325 MG per tablet Take 1-2 tablets by mouth every 6 (six) hours as needed for severe pain. Patient not taking: Reported on 03/16/2014 05/01/13   Ovid Curd  Alvino Chapel, MD  predniSONE (DELTASONE) 10 MG tablet Take 10 mg by mouth daily with breakfast.    Historical Provider, MD  predniSONE (DELTASONE) 20 MG tablet Take 2 tablets (40 mg total) by mouth daily. Take 40 mg by mouth daily for 3 days, then 20mg  by mouth daily for 3 days, then 10mg  daily for 3 days Patient not taking: Reported on 04/10/2014 03/16/14   Wandra Arthurs, MD  promethazine-codeine [facility] WITH CODEINE) 6.25-10 MG/5ML syrup Take 5 mLs by mouth every 6 (six) hours as needed for cough. Patient not taking: Reported on 03/16/2014 06/21/13   Larene Pickett, PA-C  traMADol-acetaminophen (ULTRACET)  37.5-325 MG per tablet Take 1 tablet by mouth every 6 (six) hours as needed. Patient not taking: Reported on 03/16/2014 06/21/13   Larene Pickett, PA-C   BP 132/92 mmHg  Pulse 101  Temp(Src) 98 F (36.7 C) (Oral)  Resp 18  SpO2 96% Physical Exam  Constitutional: He is oriented to person, place, and time. He appears well-developed and well-nourished. No distress.  HENT:  Head: Normocephalic and atraumatic.  Mouth/Throat: Uvula is midline and mucous membranes are normal. No uvula swelling. No oropharyngeal exudate or tonsillar abscesses.  Uvula midline. No tonsillar abscess or exudate. No anterior cervical lymphadenopathy.   Eyes: Conjunctivae and EOM are normal.  Neck: Neck supple. No tracheal deviation present.  Cardiovascular: Normal rate.   Pulmonary/Chest: Effort normal. No respiratory distress. He has no wheezes. He has no rales.  Musculoskeletal: Normal range of motion.  Lymphadenopathy:    He has no cervical adenopathy.  Neurological: He is alert and oriented to person, place, and time.  Skin: Skin is warm and dry.  Psychiatric: He has a normal mood and affect. His behavior is normal.  Nursing note and vitals reviewed.   ED Course  Procedures (including critical care time) Labs Review Labs Reviewed  RAPID STREP SCREEN (NOT AT [facility])  CULTURE, GROUP A STREP    Imaging Review No results found.   EKG Interpretation None     Medications - No data to display  2:30 PM- Treatment plan was discussed with patient who verbalizes understanding and agrees.   MDM   Final diagnoses:  Upper respiratory infection, viral   Patient presents for sore throat and cough that began Monday. His exam is normal. He has sick contacts at home. I believe this is a viral illness. Strep negative. I will treat him symptomatically. He is currently afebrile. I prescribed Tessalon for cough. I recommended that he take Chloraseptic spray which she can get over-the-counter. He states that he has  seasonal allergies with itchy throat and watery eyes. I recommended that he take Claritin but will need follow-up with his PCP.  I personally performed the services described in this documentation, which was scribed in my presence. The recorded information has been reviewed and is accurate.    Ottie Glazier, PA-C 07/09/14 [place], MD 07/09/14 [phone]

## 2014-07-11 LAB — CULTURE, GROUP A STREP: Strep A Culture: NEGATIVE

## 2014-07-23 ENCOUNTER — Encounter: Payer: Self-pay | Admitting: Internal Medicine

## 2014-07-23 ENCOUNTER — Encounter (INDEPENDENT_AMBULATORY_CARE_PROVIDER_SITE_OTHER): Payer: Self-pay

## 2014-07-23 ENCOUNTER — Ambulatory Visit (INDEPENDENT_AMBULATORY_CARE_PROVIDER_SITE_OTHER): Payer: Self-pay | Admitting: Internal Medicine

## 2014-07-23 VITALS — BP 122/90 | HR 89 | Ht 68.0 in | Wt 255.0 lb

## 2014-07-23 DIAGNOSIS — R058 Other specified cough: Secondary | ICD-10-CM | POA: Insufficient documentation

## 2014-07-23 DIAGNOSIS — R05 Cough: Secondary | ICD-10-CM

## 2014-07-23 DIAGNOSIS — D869 Sarcoidosis, unspecified: Secondary | ICD-10-CM

## 2014-07-23 MED ORDER — FAMOTIDINE 20 MG PO TABS: ORAL_TABLET | ORAL | Status: DC

## 2014-07-23 NOTE — Patient Instructions (Addendum)
Augmentin 875 mg take one pill twice daily  X 10 days - take at breakfast and supper with large glass of water.  It would help reduce the usual side effects (diarrhea and yeast infections) if you ate cultured yogurt at lunch.   Pepcid ac 20 mg one bedtime (over the counter)   Please see patient coordinator before you leave today  to schedule sinus in 2 weeks - no sooner   Once better prednisone to 10 mg per day until return   GERD (REFLUX)  is an extremely common cause of respiratory symptoms just like yours , many times with no obvious heartburn at all.    It can be treated with medication, but also with lifestyle changes including avoidance of late meals, elevation of the head of your bed (ideally with 6 inch  bed blocks) excessive alcohol, smoking cessation, and avoid fatty foods, chocolate, peppermint, colas, red wine, and acidic juices such as orange juice.  NO MINT OR MENTHOL PRODUCTS SO NO COUGH DROPS  USE SUGARLESS CANDY INSTEAD (Jolley ranchers or Stover's or Life Savers) or even ice chips will also do - the key is to swallow to prevent all throat clearing. NO OIL BASED VITAMINS - use powdered substitutes.    Please schedule a follow up office visit in 4 weeks, sooner if needed  Bring all meds to office
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
## 2014-07-24 ENCOUNTER — Telehealth: Payer: Self-pay | Admitting: Internal Medicine

## 2014-07-24 ENCOUNTER — Encounter: Payer: Self-pay | Admitting: Internal Medicine

## 2014-07-24 MED ORDER — PREDNISONE 10 MG PO TABS: 10.0000 mg | ORAL_TABLET | Freq: Every day | ORAL | Status: DC

## 2014-07-24 MED ORDER — AMOXICILLIN-POT CLAVULANATE 875-125 MG PO TABS: 1.0000 | ORAL_TABLET | Freq: Two times a day (BID) | ORAL | Status: DC

## 2014-07-24 NOTE — Telephone Encounter (Signed)
augmentin is the best / cheaper than most alternatives. Ok with med to finish his present abx and go ahead an do sinus ct now but if shows sinus dz will be referring him to a sinus doctor that will cost a lot more than 80 dollars  I don't think he has active pulmonary sarcoid   so rec he reduce the dose now and we'll  Work toward getting him completely off  (it may be in the sinuses and he ends of seeing a sinus doctor anyway for this so if he just wants to bypass all the abx/scans we can refer him directly to ent

## 2014-07-24 NOTE — Telephone Encounter (Signed)
Called and spoke to pt. Pt stated the augmentin and pred rx has not yet been called in since OV with MW on 6/9. Both rx sent to preferred pharmacy. Pt verbalized understanding and denied any further questions or concerns at this time.

## 2014-07-24 NOTE — Telephone Encounter (Signed)
Pt was informed of your recommendations and states that he will think about it and get back with you on what he decides.

## 2014-07-24 NOTE — Assessment & Plan Note (Signed)
Body mass index is 38.78 kg/(m^2).  This is clearly his greatest health problem and is made worse by chronic exp to steroids/ advised we need to work on eliminating them and getting him into neg cal balance

## 2014-07-24 NOTE — Assessment & Plan Note (Signed)
Presently on 20 mg per day with no evidence at all to support active sarcoid/ rec taper off and consider nasal w/u rather than lung bx if his noct and am cough  with green mucus remains his greatest concern as it's possible this is due to underlying sarcoid rhinitis

## 2014-07-24 NOTE — Assessment & Plan Note (Signed)
The most common causes of chronic cough in immunocompetent adults include the following: upper airway cough syndrome (UACS), previously referred to as postnasal drip syndrome (PNDS), which is caused by variety of rhinosinus conditions; (2) asthma; (3) GERD; (4) chronic bronchitis from cigarette smoking or other inhaled environmental irritants; (5) nonasthmatic eosinophilic bronchitis; and (6) bronchiectasis.   These conditions, singly or in combination, have accounted for up to 94% of the causes of chronic cough in prospective studies.   Other conditions have constituted no >6% of the causes in prospective studies These have included bronchogenic carcinoma, chronic interstitial pneumonia, sarcoidosis, left ventricular failure, ACEI-induced cough, and aspiration from a condition associated with pharyngeal dysfunction.    Chronic cough is often simultaneously caused by more than one condition. A single cause has been found from 38 to 82% of the time, multiple causes from 18 to 62%. Multiply caused cough has been the result of three diseases up to 42% of the time.       Based on hx and exam, this is most likely:  Classic Upper airway cough syndrome, so named because it's frequently impossible to sort out how much is  CR/sinusitis with freq throat clearing (which can be related to primary GERD)   vs  causing  secondary (" extra esophageal")  GERD from wide swings in gastric pressure that occur with throat clearing, often  promoting self use of mint and menthol lozenges that reduce the lower esophageal sphincter tone and exacerbate the problem further in a cyclical fashion.   These are the same pts (now being labeled as having "irritable larynx syndrome" by some cough centers) who not infrequently have a history of having failed to tolerate ace inhibitors,  dry powder inhalers or biphosphonates or report having atypical reflux symptoms that don't respond to standard doses of PPI , and are easily confused as  having aecopd or asthma flares by even experienced allergists/ pulmonologists.   The first step is to treat for sinus dz x 3 weeks then CT sinus  See instructions for specific recommendations which were reviewed directly with the patient who was given a copy with highlighter outlining the key components.

## 2014-07-24 NOTE — Telephone Encounter (Signed)
Pt states that when he seen you yesterday that you mentioned that you weren't diagnosing him with sarcoidosis so he is asking why is taking the Prednisone? He also states that the medication you sent to the pharmacy is going to cost him $60 and is asking if you can send something cheaper. Please advise.

## 2014-07-28 ENCOUNTER — Telehealth: Payer: Self-pay | Admitting: Internal Medicine

## 2014-07-28 NOTE — Telephone Encounter (Signed)
Spoke with pt, states he was able to get the abx, started this yesterday.  I verified the dosage instructions on his medications.  Nothing further needed.

## 2014-08-10 ENCOUNTER — Ambulatory Visit (INDEPENDENT_AMBULATORY_CARE_PROVIDER_SITE_OTHER)
Admission: RE | Admit: 2014-08-10 | Discharge: 2014-08-10 | Disposition: A | Discharge status: Home / Self Care | Payer: Self-pay | Source: Ambulatory Visit | Attending: Internal Medicine | Admitting: Internal Medicine

## 2014-08-10 DIAGNOSIS — R05 Cough: Secondary | ICD-10-CM

## 2014-08-10 DIAGNOSIS — R058 Other specified cough: Secondary | ICD-10-CM

## 2014-08-11 ENCOUNTER — Other Ambulatory Visit: Payer: Self-pay | Admitting: Internal Medicine

## 2014-08-11 ENCOUNTER — Telehealth: Payer: Self-pay | Admitting: Internal Medicine

## 2014-08-11 DIAGNOSIS — R058 Other specified cough: Secondary | ICD-10-CM

## 2014-08-11 DIAGNOSIS — R05 Cough: Secondary | ICD-10-CM

## 2014-08-11 NOTE — Progress Notes (Signed)
Quick Note:  Spoke with pt and notified of results per Dr. Wert. Pt verbalized understanding and denied any questions.  ______ 

## 2014-08-11 NOTE — Telephone Encounter (Signed)
Pt calling to confirm APPT on 7/7 w/ MW. He is still awaiting appt to see ENT. Nothing further needed

## 2014-08-12 ENCOUNTER — Telehealth: Payer: Self-pay | Admitting: Internal Medicine

## 2014-08-12 NOTE — Telephone Encounter (Signed)
Aware, ok to wait for now but be sure has f/u ov with me w/in 4 weeks with all meds in hand

## 2014-08-12 NOTE — Telephone Encounter (Signed)
Pt scheduled to see MW 08/20/14 already. Will bring all meds. Nothing further needed

## 2014-08-12 NOTE — Telephone Encounter (Signed)
[facility] ENT and explained that the patient does not currently have insurance. They can make an appt for him to be seen but he would have to pay 100.00 up front and then would be billed for the balance.  I called Nathan [name] and explained what they offered. He stated for [place] to wait and see what he could work out about the money and he would call us back when ready.

## 2014-08-20 ENCOUNTER — Telehealth: Payer: Self-pay | Admitting: Internal Medicine

## 2014-08-20 ENCOUNTER — Encounter: Payer: Self-pay | Admitting: Internal Medicine

## 2014-08-20 ENCOUNTER — Ambulatory Visit (INDEPENDENT_AMBULATORY_CARE_PROVIDER_SITE_OTHER)
Admission: RE | Admit: 2014-08-20 | Discharge: 2014-08-20 | Disposition: A | Discharge status: Home / Self Care | Payer: Self-pay | Source: Ambulatory Visit | Attending: Internal Medicine | Admitting: Internal Medicine

## 2014-08-20 ENCOUNTER — Ambulatory Visit (INDEPENDENT_AMBULATORY_CARE_PROVIDER_SITE_OTHER): Payer: Self-pay | Admitting: Internal Medicine

## 2014-08-20 VITALS — BP 160/96 | HR 89 | Ht 68.0 in | Wt 263.0 lb

## 2014-08-20 DIAGNOSIS — D869 Sarcoidosis, unspecified: Secondary | ICD-10-CM

## 2014-08-20 DIAGNOSIS — R0789 Other chest pain: Secondary | ICD-10-CM

## 2014-08-20 DIAGNOSIS — R058 Other specified cough: Secondary | ICD-10-CM

## 2014-08-20 DIAGNOSIS — R05 Cough: Secondary | ICD-10-CM

## 2014-08-20 MED ORDER — PREDNISONE 10 MG PO TABS: 10.0000 mg | ORAL_TABLET | Freq: Every day | ORAL | Status: DC

## 2014-08-20 NOTE — Patient Instructions (Addendum)
Prednisone 10 mg one daily until better then one half daily   Keep appt with ENT when you can   Please remember to go to the  x-ray department downstairs for your tests - we will call you with the results when they are available.  zostrix cream 4 x daily just apply right where you have pain     Please schedule a follow up office visit in 6 weeks, call sooner if needed
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
## 2014-08-20 NOTE — Telephone Encounter (Signed)
Result Notes     Notes Recorded by Rosana Berger, CMA on 08/20/2014 at 2:45 PM [facility] ------  Notes Recorded by Tanda Rockers, MD on 08/20/2014 at 1:06 PM Call pt: Reviewed cxr and no acute change so no change in recommendations made at ov   Pt is aware of results. Nothing further was needed.

## 2014-08-20 NOTE — Progress Notes (Signed)
Quick Note:  LMTCB ______ 

## 2014-08-22 ENCOUNTER — Encounter: Payer: Self-pay | Admitting: Internal Medicine

## 2014-08-22 DIAGNOSIS — R0789 Other chest pain: Secondary | ICD-10-CM | POA: Insufficient documentation

## 2014-08-22 NOTE — Assessment & Plan Note (Signed)
Pain is most c/w neuralgia as present 24/7 x 2 y s any  Fluctuation whatsoever > try zostrix

## 2014-08-22 NOTE — Assessment & Plan Note (Signed)
Body mass index is 40 kg/(m^2).  No results found for: TSH   Contributing to gerd tendency/ doe/ needs to achieve and maintain neg calorie balance >f/u primary care   > needs TSH if not done this year

## 2014-08-22 NOTE — Assessment & Plan Note (Signed)
-   Steroid trial 03/06/2011  For clinical dx only  The goal with a chronic steroid dependent illness is always arriving at the lowest effective dose that controls the disease/symptoms and not accepting a set "formula" which is based on statistics or guidelines that don't always take into account patient  variability or the natural hx of the dz in every individual patient, which may well vary over time.  For now therefore I recommend the patient maintain  A ceiling of 10 mg daily and a floor of 5 mg

## 2014-08-22 NOTE — Assessment & Plan Note (Signed)
-   augmentin x 20 days complete 08/03/14 > sinus ct  08/10/14 >Visualized paranasal sinuses are essentially clear. Minimal layering fluid in the left sphenoid sinus.> refer ent  > notified 08/12/2014 would have to pay cash and wishes to delay for now  His upper airway symptoms are the predominant problem and again strongly rec ent eval

## 2014-08-24 NOTE — Progress Notes (Signed)
Quick Note:  Spoke with pt and notified of results per Dr. Wert. Pt verbalized understanding and denied any questions.  ______ 

## 2014-09-26 ENCOUNTER — Emergency Department (HOSPITAL_COMMUNITY)
Admission: EM | Admit: 2014-09-26 | Discharge: 2014-09-26 | Disposition: A | Discharge status: Home / Self Care | Payer: Self-pay | Attending: Emergency Medicine | Admitting: Emergency Medicine

## 2014-09-26 ENCOUNTER — Encounter (HOSPITAL_COMMUNITY): Payer: Self-pay | Admitting: *Deleted

## 2014-09-26 DIAGNOSIS — Z7982 Long term (current) use of aspirin: Secondary | ICD-10-CM | POA: Insufficient documentation

## 2014-09-26 DIAGNOSIS — Z862 Personal history of diseases of the blood and blood-forming organs and certain disorders involving the immune mechanism: Secondary | ICD-10-CM | POA: Insufficient documentation

## 2014-09-26 DIAGNOSIS — Z7952 Long term (current) use of systemic steroids: Secondary | ICD-10-CM | POA: Insufficient documentation

## 2014-09-26 DIAGNOSIS — J45909 Unspecified asthma, uncomplicated: Secondary | ICD-10-CM | POA: Insufficient documentation

## 2014-09-26 DIAGNOSIS — Z87891 Personal history of nicotine dependence: Secondary | ICD-10-CM | POA: Insufficient documentation

## 2014-09-26 DIAGNOSIS — Z79899 Other long term (current) drug therapy: Secondary | ICD-10-CM | POA: Insufficient documentation

## 2014-09-26 DIAGNOSIS — M542 Cervicalgia: Secondary | ICD-10-CM | POA: Insufficient documentation

## 2014-09-26 DIAGNOSIS — G8929 Other chronic pain: Secondary | ICD-10-CM | POA: Insufficient documentation

## 2014-09-26 MED ORDER — METHOCARBAMOL 500 MG PO TABS: 500.0000 mg | ORAL_TABLET | Freq: Two times a day (BID) | ORAL | Status: DC

## 2014-09-26 MED ORDER — CYCLOBENZAPRINE HCL 10 MG PO TABS
5.0000 mg | ORAL_TABLET | Freq: Once | ORAL | Status: AC
  Administered 2014-09-26: 5 mg via ORAL
  Filled 2014-09-26: qty 1

## 2014-09-26 MED ORDER — NAPROXEN 500 MG PO TABS
500.0000 mg | ORAL_TABLET | Freq: Two times a day (BID) | ORAL | Status: DC
Start: 2014-09-26 — End: 2014-11-09

## 2014-09-26 MED ORDER — NAPROXEN 250 MG PO TABS
500.0000 mg | ORAL_TABLET | Freq: Once | ORAL | Status: AC
  Administered 2014-09-26: 500 mg via ORAL
  Filled 2014-09-26: qty 2

## 2014-09-26 NOTE — ED Notes (Signed)
Declined W/C at D/C and was escorted to lobby by RN. 

## 2014-09-26 NOTE — Discharge Instructions (Signed)

## 2014-09-26 NOTE — ED Provider Notes (Signed)
History  This chart was scribed for non-physician practitioner, Ottie Glazier, PA-C,working with Lajean Saver, MD, by Marlowe Kays, ED Scribe. This patient was seen in room TR05C/TR05C and the patient's care was started at 3:09 PM.  Chief Complaint  Patient presents with  . Back Pain   The history is provided by the patient and medical records. No language interpreter was used.    HPI Comments:  Nathan [name] is a 44 y.o. obese male with PMHx of chronic back pain who presents to the Emergency Department complaining of severe left upper back muscle spasm for the past two weeks. Pt reports that the pain radiates up into the left-side of his neck. He reports the pain has been getting worse. He states he has been taking Ibuprofen with mild relief of the pain. He denies modifying factors. He denies numbness, tingling or weakness of the LUE, bruising, wounds, fever, chills, nausea or vomiting. He denies any known trauma, injury or fall.  Past Medical History  Diagnosis Date  . Asthma     'I think I might have asthma"  . Sarcoidosis   . Dyspnea     chronic  . Chronic back pain    History reviewed. No pertinent past surgical history. Family History  Problem Relation Age of Onset  . Heart disease Maternal Grandfather   . Heart disease Paternal Uncle   . Prostate cancer Paternal Uncle    Social History  Substance Use Topics  . Smoking status: Former Smoker -- 1.00 packs/day for 20 years    Types: Cigarettes    Quit date: 01/13/2011  . Smokeless tobacco: Never Used  . Alcohol Use: No    Review of Systems  Constitutional: Negative for fever and chills.  Gastrointestinal: Negative for nausea and vomiting.  Musculoskeletal: Positive for back pain.  Skin: Negative for color change and wound.    Allergies  Percocet and Vicodin  Home Medications   Prior to Admission medications   Medication Sig Start Date End Date Taking? Authorizing Provider  aspirin EC 81 MG tablet  Take 81 mg by mouth daily.    Historical Provider, MD  famotidine (PEPCID) 20 MG tablet One at bedtime 07/23/14   Tanda Rockers, MD  methocarbamol (ROBAXIN) 500 MG tablet Take 1 tablet (500 mg total) by mouth 2 (two) times daily. 09/26/14   Cleon Signorelli Patel-Mills, PA-C  nabumetone (RELAFEN) 500 MG tablet Take 500 mg by mouth 2 (two) times daily.    Historical Provider, MD  naproxen (NAPROSYN) 500 MG tablet Take 1 tablet (500 mg total) by mouth 2 (two) times daily. 09/26/14   Arturo Sofranko Patel-Mills, PA-C  predniSONE (DELTASONE) 10 MG tablet Take 1 tablet (10 mg total) by mouth daily with breakfast. 08/20/14   Tanda Rockers, MD   Triage Vitals: BP 132/96 mmHg  Pulse 83  Temp(Src) 98.1 F (36.7 C) (Oral)  Resp 18  Ht 5\' 7"  (1.702 m)  Wt 260 lb (117.935 kg)  BMI 40.71 kg/m2  SpO2 94% Physical Exam  Constitutional: He is oriented to person, place, and time. He appears well-developed and well-nourished.  HENT:  Head: Normocephalic and atraumatic.  Eyes: EOM are normal.  Neck: Normal range of motion.  Cardiovascular: Normal rate.   Pulmonary/Chest: Effort normal.  Musculoskeletal: Normal range of motion.  No reproducible midline or paravertebral cervical, thoracic, or lumbar tenderness to palpation.  NVI. Able to abduct and adduct bilateral upper extremities without difficulty. No scapular winging.   Neurological: He is alert and oriented  to person, place, and time.  Skin: Skin is warm and dry.  Psychiatric: He has a normal mood and affect. His behavior is normal.  Nursing note and vitals reviewed.   ED Course  Procedures (including critical care time) DIAGNOSTIC STUDIES: Oxygen Saturation is 94% on RA, adequate by my interpretation.   COORDINATION OF CARE: 3:15 PM- Will prescribe Naproxen and a muscle relaxer. Pt verbalizes understanding and agrees to plan.  Medications  cyclobenzaprine (FLEXERIL) tablet 5 mg (5 mg Oral Given 09/26/14 1535)  naproxen (NAPROSYN) tablet 500 mg (500 mg Oral Given  09/26/14 1537)    Labs Review Labs Reviewed - No data to display  Imaging Review No results found.   EKG Interpretation None      MDM   Final diagnoses:  Neck pain on left side   I think he pain is most likely musculoskeletal related. No signs of cord compression. I prescribed naproxen and robaxin.  I discussed return precautions and follow up with his pcp.  He verbally agrees with the plan.  Medications  cyclobenzaprine (FLEXERIL) tablet 5 mg (5 mg Oral Given 09/26/14 1535)  naproxen (NAPROSYN) tablet 500 mg (500 mg Oral Given 09/26/14 1537)  I personally performed the services described in this documentation, which was scribed in my presence. The recorded information has been reviewed and is accurate.    Ottie Glazier, PA-C 09/26/14 1813  Lajean Saver, MD 09/27/14 [phone]

## 2014-09-26 NOTE — ED Notes (Signed)
Pt reports muscle spasms to left upper back x 2 weeks. Ambulatory at triage and no acute distress noted.

## 2014-09-28 ENCOUNTER — Ambulatory Visit: Payer: Self-pay | Admitting: Internal Medicine

## 2014-10-01 ENCOUNTER — Ambulatory Visit: Payer: Self-pay | Admitting: Internal Medicine

## 2014-11-09 ENCOUNTER — Encounter (HOSPITAL_COMMUNITY): Payer: Self-pay | Admitting: Family Medicine

## 2014-11-09 ENCOUNTER — Emergency Department (HOSPITAL_COMMUNITY)
Admission: EM | Admit: 2014-11-09 | Discharge: 2014-11-09 | Disposition: A | Discharge status: Home / Self Care | Payer: Self-pay | Attending: Emergency Medicine | Admitting: Emergency Medicine

## 2014-11-09 DIAGNOSIS — Z79899 Other long term (current) drug therapy: Secondary | ICD-10-CM | POA: Insufficient documentation

## 2014-11-09 DIAGNOSIS — Z7952 Long term (current) use of systemic steroids: Secondary | ICD-10-CM | POA: Insufficient documentation

## 2014-11-09 DIAGNOSIS — Z87891 Personal history of nicotine dependence: Secondary | ICD-10-CM | POA: Insufficient documentation

## 2014-11-09 DIAGNOSIS — G8929 Other chronic pain: Secondary | ICD-10-CM | POA: Insufficient documentation

## 2014-11-09 DIAGNOSIS — Z7982 Long term (current) use of aspirin: Secondary | ICD-10-CM | POA: Insufficient documentation

## 2014-11-09 DIAGNOSIS — J45909 Unspecified asthma, uncomplicated: Secondary | ICD-10-CM | POA: Insufficient documentation

## 2014-11-09 DIAGNOSIS — Z862 Personal history of diseases of the blood and blood-forming organs and certain disorders involving the immune mechanism: Secondary | ICD-10-CM | POA: Insufficient documentation

## 2014-11-09 DIAGNOSIS — B029 Zoster without complications: Secondary | ICD-10-CM | POA: Insufficient documentation

## 2014-11-09 MED ORDER — TRAMADOL HCL 50 MG PO TABS: 50.0000 mg | ORAL_TABLET | Freq: Four times a day (QID) | ORAL | Status: DC | PRN

## 2014-11-09 MED ORDER — VALACYCLOVIR HCL 1 G PO TABS: 1000.0000 mg | ORAL_TABLET | Freq: Three times a day (TID) | ORAL | Status: DC

## 2014-11-09 MED ORDER — NAPROXEN 500 MG PO TABS: 500.0000 mg | ORAL_TABLET | Freq: Two times a day (BID) | ORAL | Status: DC

## 2014-11-09 NOTE — ED Notes (Signed)
Declined W/C at D/C and was escorted to lobby by RN. 

## 2014-11-09 NOTE — Discharge Instructions (Signed)

## 2014-11-09 NOTE — ED Provider Notes (Signed)
CSN: [id number]     Arrival date & time 11/09/14  1019 History  This chart was scribed for non-physician practitioner Debroah Baller, NP working with Blanchie Dessert, MD by Hilda Lias, ED Scribe. This patient was seen in room TR11C/TR11C and the patient's care was started at 1:24 PM.  Chief Complaint  Patient presents with  . Rash      The history is provided by the patient. No language interpreter was used.   HPI Comments: Nathan [name] is a 44 y.o. male who presents to the Emergency Department complaining of a constant, worsening spreading, erythematous, stinging, painful rash on his left axilla and middle of his back that has been present for 3 days. Pt reports 3 days ago he felt like something bit him in the middle of his back and states the rash on his left axilla and rib cage appeared yesterday. Pt denies any other symptoms.    Past Medical History  Diagnosis Date  . Asthma     'I think I might have asthma"  . Sarcoidosis   . Dyspnea     chronic  . Chronic back pain    History reviewed. No pertinent past surgical history. Family History  Problem Relation Age of Onset  . Heart disease Maternal Grandfather   . Heart disease Paternal Uncle   . Prostate cancer Paternal Uncle    Social History  Substance Use Topics  . Smoking status: Former Smoker -- 1.00 packs/day for 20 years    Types: Cigarettes    Quit date: 01/13/2011  . Smokeless tobacco: Never Used  . Alcohol Use: No    Review of Systems Negative except as stated in HPI  Allergies  Percocet and Vicodin  Home Medications   Prior to Admission medications   Medication Sig Start Date End Date Taking? Authorizing Provider  aspirin EC 81 MG tablet Take 81 mg by mouth daily.    Historical Provider, MD  famotidine (PEPCID) 20 MG tablet One at bedtime 07/23/14   Tanda Rockers, MD  methocarbamol (ROBAXIN) 500 MG tablet Take 1 tablet (500 mg total) by mouth 2 (two) times daily. 09/26/14   Hanna Patel-Mills, PA-C   nabumetone (RELAFEN) 500 MG tablet Take 500 mg by mouth 2 (two) times daily.    Historical Provider, MD  naproxen (NAPROSYN) 500 MG tablet Take 1 tablet (500 mg total) by mouth 2 (two) times daily. 11/09/14   Ameirah Khatoon Bunnie Pion, NP  predniSONE (DELTASONE) 10 MG tablet Take 1 tablet (10 mg total) by mouth daily with breakfast. 08/20/14   Tanda Rockers, MD  traMADol (ULTRAM) 50 MG tablet Take 1 tablet (50 mg total) by mouth every 6 (six) hours as needed. 11/09/14   Averie Meiner Bunnie Pion, NP  valACYclovir (VALTREX) 1000 MG tablet Take 1 tablet (1,000 mg total) by mouth 3 (three) times daily. 11/09/14   Alin Hutchins Bunnie Pion, NP   BP 144/88 mmHg  Pulse 88  Temp(Src) 98.2 F (36.8 C)  Resp 20  SpO2 97% Physical Exam  Constitutional: He is oriented to person, place, and time. He appears well-developed and well-nourished.  HENT:  Head: Normocephalic and atraumatic.  Cardiovascular: Normal rate.   Pulmonary/Chest: Effort normal.  Abdominal: He exhibits no distension.  Neurological: He is alert and oriented to person, place, and time.  Skin: Skin is warm and dry. Rash noted.  Vesicular rash that starts to the left of the thoracic spine and extends to the left upper abdomen  Psychiatric: He has a  normal mood and affect.  Nursing note and vitals reviewed.   ED Course  Procedures (including critical care time)  DIAGNOSTIC STUDIES: Oxygen Saturation is 97% on room air, normal by my interpretation.    COORDINATION OF CARE: 1:28 PM Discussed treatment plan with pt at bedside and pt agreed to plan.  MDM  44 y.o. male with blister area that are painful and have spread over the past 24 hours. Will treat for Zoster and pain. He will return as needed for any problems. Discussed with the patient and all questioned fully answered. He will call me if any problems arise.   Final diagnoses:  Herpes zoster   I personally performed the services described in this documentation, which was scribed in my presence. The recorded  information has been reviewed and is accurate.    [place], [place] 11/10/14 [place], MD 11/11/14 [phone]

## 2014-11-09 NOTE — ED Notes (Signed)
Pt here for rash to mid back and left upper side area .

## 2014-12-18 ENCOUNTER — Encounter (HOSPITAL_COMMUNITY): Payer: Self-pay | Admitting: *Deleted

## 2014-12-18 ENCOUNTER — Emergency Department (HOSPITAL_COMMUNITY)
Admission: EM | Admit: 2014-12-18 | Discharge: 2014-12-18 | Disposition: A | Discharge status: Home / Self Care | Payer: Self-pay | Attending: Emergency Medicine | Admitting: Emergency Medicine

## 2014-12-18 DIAGNOSIS — Z87891 Personal history of nicotine dependence: Secondary | ICD-10-CM | POA: Insufficient documentation

## 2014-12-18 DIAGNOSIS — Z791 Long term (current) use of non-steroidal anti-inflammatories (NSAID): Secondary | ICD-10-CM | POA: Insufficient documentation

## 2014-12-18 DIAGNOSIS — J45901 Unspecified asthma with (acute) exacerbation: Secondary | ICD-10-CM | POA: Insufficient documentation

## 2014-12-18 DIAGNOSIS — D869 Sarcoidosis, unspecified: Secondary | ICD-10-CM | POA: Insufficient documentation

## 2014-12-18 DIAGNOSIS — M549 Dorsalgia, unspecified: Secondary | ICD-10-CM

## 2014-12-18 DIAGNOSIS — Z7952 Long term (current) use of systemic steroids: Secondary | ICD-10-CM | POA: Insufficient documentation

## 2014-12-18 DIAGNOSIS — G8929 Other chronic pain: Secondary | ICD-10-CM | POA: Insufficient documentation

## 2014-12-18 DIAGNOSIS — Z7982 Long term (current) use of aspirin: Secondary | ICD-10-CM | POA: Insufficient documentation

## 2014-12-18 DIAGNOSIS — M546 Pain in thoracic spine: Secondary | ICD-10-CM | POA: Insufficient documentation

## 2014-12-18 DIAGNOSIS — Z79899 Other long term (current) drug therapy: Secondary | ICD-10-CM | POA: Insufficient documentation

## 2014-12-18 MED ORDER — MELOXICAM 7.5 MG PO TABS: 7.5000 mg | ORAL_TABLET | Freq: Every day | ORAL | Status: DC | PRN

## 2014-12-18 MED ORDER — PREDNISONE 10 MG PO TABS: 10.0000 mg | ORAL_TABLET | Freq: Every day | ORAL | Status: DC

## 2014-12-18 MED ORDER — FAMOTIDINE 20 MG PO TABS: ORAL_TABLET | ORAL | Status: DC

## 2014-12-18 NOTE — ED Notes (Signed)
PT reports he needs a follow up on a recent diagnosis of Sarcoidosis. Pt reports he went DR Melvyn Novas but des not feel he received the right information. Pt also reports he has been unable to follow up with a DR for his sinus infection because it would cost him $ 100.00. Pt reports he sweats at work and one side of his nose gets cold . Pt wants to know if the sarcoidosis causes his upper back to hurt .

## 2014-12-18 NOTE — Discharge Instructions (Signed)
Read the information below.  Use the prescribed medication as directed.  Please discuss all new medications with your pharmacist.  You may return to the Emergency Department at any time for worsening condition or any new symptoms that concern you.    If you develop fevers, loss of control of bowel or bladder, weakness or numbness in your legs, or are unable to walk, return to the ER for a recheck.    Back Pain, Adult Back pain is very common. The pain often gets better over time. The cause of back pain is usually not dangerous. Most people can learn to manage their back pain on their own.  HOME CARE  Watch your back pain for any changes. The following actions may help to lessen any pain you are feeling:  Stay active. Start with short walks on flat ground if you can. Try to walk farther each day.  Exercise regularly as told by your doctor. Exercise helps your back heal faster. It also helps avoid future injury by keeping your muscles strong and flexible.  Do not sit, drive, or stand in one place for more than 30 minutes.  Do not stay in bed. Resting more than 1-2 days can slow down your recovery.  Be careful when you bend or lift an object. Use good form when lifting:  Bend at your knees.  Keep the object close to your body.  Do not twist.  Sleep on a firm mattress. Lie on your side, and bend your knees. If you lie on your back, put a pillow under your knees.  Take medicines only as told by your doctor.  Put ice on the injured area.  Put ice in a plastic bag.  Place a towel between your skin and the bag.  Leave the ice on for 20 minutes, 2-3 times a day for the first 2-3 days. After that, you can switch between ice and heat packs.  Avoid feeling anxious or stressed. Find good ways to deal with stress, such as exercise.  Maintain a healthy weight. Extra weight puts stress on your back. GET HELP IF:   You have pain that does not go away with rest or medicine.  You have  worsening pain that goes down into your legs or buttocks.  You have pain that does not get better in one week.  You have pain at night.  You lose weight.  You have a fever or chills. GET HELP RIGHT AWAY IF:   You cannot control when you poop (bowel movement) or pee (urinate).  Your arms or legs feel weak.  Your arms or legs lose feeling (numbness).  You feel sick to your stomach (nauseous) or throw up (vomit).  You have belly (abdominal) pain.  You feel like you may pass out (faint).   This information is not intended to replace advice given to you by your health care provider. Make sure you discuss any questions you have with your health care provider.   Document Released: 07/19/2007 Document Revised: 02/20/2014 Document Reviewed: 06/03/2013 Elsevier Interactive Patient Education 2016 [employer].  Sarcoidosis Sarcoidosis is a disease that causes inflammation in your organs and other areas of your body. The lungs are most often affected (pulmonary sarcoidosis). Sarcoidosis can also affect your lymph nodes, liver, eyes, skin, or any other body tissue. When you have sarcoidosis, small clumps of tissue (granulomas) form in the affected area of your body. Granulomas are made up of your body's defense (immune) cells. Inflammation results when your body  reacts to a harmful substance. Normally, inflammation goes away after immune cells get rid of the harmful substance. In sarcoidosis, the immune cells form granulomas instead. CAUSES  The exact cause of sarcoidosis is not known. Something triggers the immune system to respond, such as dust, chemicals, bacteria, or a virus.  RISK FACTORS You may be at a greater risk for sarcoidosis if you:   Have a family history of the disease.  Are African American.  Are of Northern European ancestry.  Are 83-39 years old.  Are male. SIGNS AND SYMPTOMS  Many people with sarcoidosis have no symptoms. Others have very mild symptoms.  Sarcoidosis most often affects the lungs. Symptoms include:  Chest pain.  Coughing.  Wheezing.  Shortness of breath. Other common symptoms include:   Night sweats.  Weight loss.  Fatigue.  Depression.  A sense of uneasiness. DIAGNOSIS  Sarcoidosis may be diagnosed by:   Medical history and physical exam.  Chest X-ray. This looks for granulomas in your lungs.  Lung function tests. These measure your breathing and look for problems related to sarcoidosis.  Examining a sample of tissue under a microscope (biopsy). TREATMENT  Sarcoidosis usually clears up without treatment. You may take medicines to reduce inflammation or relieve symptoms. These may include:  Prednisone. This steroid reduces inflammation related to sarcoidosis.  Chloroquine or hydroxychloroquine. These are antimalarial medicines used to treat sarcoidosis that affects the skin or brain.  Methotrexate, leflunomide, or azathioprine. These medicines affect the immune system and can help with sarcoidosis in the joints, eyes, skin, or lungs.  Inhalers. Inhaled medicines can help you breathe if sarcoidosis is affecting your lungs. HOME CARE INSTRUCTIONS  Do not use any tobacco products, including cigarettes, chewing tobacco, or electronic cigarettes. If you need help quitting, ask your health care provider.  Avoid secondhand smoke.  Avoid irritating dust and chemicals. Stay indoors on days when air quality is poor in your area.  Take medicines only as directed by your health care provider. SEEK MEDICAL CARE IF:  You have vision problems.  You have shortness of breath.  You have a dry, persistent cough.  You have an irregular heartbeat.  You have pain or ache in your joints, hands, or feet.  You have an unexplained rash. SEEK IMMEDIATE MEDICAL CARE IF:  You have chest pain.   This information is not intended to replace advice given to you by your health care provider. Make sure you discuss any  questions you have with your health care provider.   Document Released: 12/01/2003 Document Revised: 02/20/2014 Document Reviewed: 05/28/2013 Elsevier Interactive Patient Education [employer].

## 2014-12-18 NOTE — ED Provider Notes (Signed)
CSN: [id number]     Arrival date & time 12/18/14  1023 History  By signing my name below, I, Soijett Blue, attest that this documentation has been prepared under the direction and in the presence of Clayton Bibles, PA-C Electronically Signed: Soijett Blue, ED Scribe. 12/18/2014. 11:09 AM.   Chief Complaint  Patient presents with  . Back Pain      The history is provided by the patient. No language interpreter was used.    HPI Comments: Nathan [name] is a 44 y.o. male with a medical hx of sarcoidosis, chronic back pain, who presents to the Emergency Department complaining of upper back pain x 2 years.  He states that his upper back pain is worsened with movement and twisting. He notes that he only does heavy lifting 2 times out of the week at his job - skipped work today, today is a heavy work day.  He is followed by Dr Melvyn Novas for sarcoidosis, is supposed to be on daily prednisone, has not followed up with him or taken his medication since July.  His PCP is [facility], has not followed up with them either.  Has had chronic sinus problems and has a referral to ENT, which he has not used.  Has health insurance offered by his job but he has not yet picked a plan - is considering this for Jan 2017 start date.   Pt recently diagnosed with herpes zoster, states this has resolved.  Pain is not in the same location where he had the rash.    Per chart review: Pt saw Dr. Melvyn Novas (pulmonology) 08/22/2014 with a plan for daily prednisone 5-10 mg for sarcoidosis. Pt also evaluated for sinusitis and completed 20 days of augment on 08/03/2014 had sinus CT on 08/10/2014 that showed essentially clear sinuses with minimal fluid in the left sphenoid sinus and he was referred to ENT.    Past Medical History  Diagnosis Date  . Asthma     'I think I might have asthma"  . Sarcoidosis ([place])   . Dyspnea     chronic  . Chronic back pain    History reviewed. No pertinent past surgical history. Family History   Problem Relation Age of Onset  . Heart disease Maternal Grandfather   . Heart disease Paternal Uncle   . Prostate cancer Paternal Uncle    Social History  Substance Use Topics  . Smoking status: Former Smoker -- 1.00 packs/day for 20 years    Types: Cigarettes    Quit date: 01/13/2011  . Smokeless tobacco: Never Used  . Alcohol Use: No    Review of Systems  Constitutional: Negative for fever and chills.  Respiratory: Positive for shortness of breath (intermittent).   Cardiovascular: Negative for chest pain.  Musculoskeletal: Positive for back pain. Negative for myalgias and gait problem.  Skin: Negative for color change and wound.  Neurological: Negative for weakness and numbness.  Hematological: Does not bruise/bleed easily.  Psychiatric/Behavioral: Negative for self-injury.      Allergies  Percocet and Vicodin  Home Medications   Prior to Admission medications   Medication Sig Start Date End Date Taking? Authorizing Provider  aspirin EC 81 MG tablet Take 81 mg by mouth daily.    Historical Provider, MD  famotidine (PEPCID) 20 MG tablet One at bedtime 07/23/14   Tanda Rockers, MD  methocarbamol (ROBAXIN) 500 MG tablet Take 1 tablet (500 mg total) by mouth 2 (two) times daily. 09/26/14   Ottie Glazier, PA-C  nabumetone (RELAFEN) 500 MG tablet Take 500 mg by mouth 2 (two) times daily.    Historical Provider, MD  naproxen (NAPROSYN) 500 MG tablet Take 1 tablet (500 mg total) by mouth 2 (two) times daily. 11/09/14   Hope Bunnie Pion, NP  predniSONE (DELTASONE) 10 MG tablet Take 1 tablet (10 mg total) by mouth daily with breakfast. 08/20/14   Tanda Rockers, MD  traMADol (ULTRAM) 50 MG tablet Take 1 tablet (50 mg total) by mouth every 6 (six) hours as needed. 11/09/14   Hope Bunnie Pion, NP  valACYclovir (VALTREX) 1000 MG tablet Take 1 tablet (1,000 mg total) by mouth 3 (three) times daily. 11/09/14   Hope Bunnie Pion, NP   BP 137/84 mmHg  Pulse 76  Temp(Src) 98.3 F (36.8 C) (Oral)   Resp 18  Ht 5\' 7"  (1.702 m)  Wt 260 lb (117.935 kg)  BMI 40.71 kg/m2  SpO2 99% Physical Exam  Constitutional: He appears well-developed and well-nourished. No distress.  HENT:  Head: Normocephalic and atraumatic.  Neck: Neck supple.  Cardiovascular: Normal rate and regular rhythm.   Pulmonary/Chest: Effort normal and breath sounds normal. No respiratory distress. He has no wheezes. He has no rales.  Abdominal: Soft. He exhibits no distension and no mass. There is no tenderness. There is no rebound and no guarding.  Musculoskeletal:       Back:  Tenderness in the left upper back and bilateral trapezius. No overlying skin changes.  Spine nontender, no crepitus, or stepoffs.    Neurological: He is alert. He exhibits normal muscle tone.  Skin: He is not diaphoretic.  Nursing note and vitals reviewed.   ED Course  Procedures (including critical care time) DIAGNOSTIC STUDIES: Oxygen Saturation is 99% on RA, nl by my interpretation.    COORDINATION OF CARE: 11:07 AM Discussed treatment plan with pt at bedside which includes Prednisone Rx, f/u with ENT specialist, gentle stretching/massage, and pt agreed to plan.   Labs Review Labs Reviewed - No data to display  Imaging Review No results found.    EKG Interpretation None      MDM   Final diagnoses:  Upper back pain on left side  Sarcoidosis ([facility])    Afebrile, nontoxic patient with chronic pain in his upper back that is unchanged x 2 years.  Had had multiple imaging studies during this time and pain is unchanged from chronic, no need for new imaging at this time.  Neurovascularly intact.  Pt also has sarcoidosis and has been off his medication and not following with his doctors.  I encouraged his close follow up.   D/C home with prednisone, pepcid, mobic, PCP, ENT, pulmonology follow up.  Discussed result, findings, treatment, and follow up  with patient.  Pt given return precautions.  Pt verbalizes understanding and agrees  with plan.        I personally performed the services described in this documentation, which was scribed in my presence. The recorded information has been reviewed and is accurate.    Clayton Bibles, PA-C 12/18/14 1654  Debby Freiberg, MD 12/24/14 [phone]

## 2015-04-17 ENCOUNTER — Emergency Department (HOSPITAL_COMMUNITY)
Admission: EM | Admit: 2015-04-17 | Discharge: 2015-04-17 | Disposition: A | Discharge status: Home / Self Care | Payer: Self-pay | Attending: Emergency Medicine | Admitting: Emergency Medicine

## 2015-04-17 ENCOUNTER — Emergency Department (HOSPITAL_COMMUNITY): Payer: Self-pay

## 2015-04-17 ENCOUNTER — Encounter (HOSPITAL_COMMUNITY): Payer: Self-pay | Admitting: Family Medicine

## 2015-04-17 DIAGNOSIS — J111 Influenza due to unidentified influenza virus with other respiratory manifestations: Secondary | ICD-10-CM | POA: Insufficient documentation

## 2015-04-17 DIAGNOSIS — Z87891 Personal history of nicotine dependence: Secondary | ICD-10-CM | POA: Insufficient documentation

## 2015-04-17 DIAGNOSIS — Z7982 Long term (current) use of aspirin: Secondary | ICD-10-CM | POA: Insufficient documentation

## 2015-04-17 DIAGNOSIS — Z79899 Other long term (current) drug therapy: Secondary | ICD-10-CM | POA: Insufficient documentation

## 2015-04-17 DIAGNOSIS — Z7952 Long term (current) use of systemic steroids: Secondary | ICD-10-CM | POA: Insufficient documentation

## 2015-04-17 DIAGNOSIS — J45909 Unspecified asthma, uncomplicated: Secondary | ICD-10-CM | POA: Insufficient documentation

## 2015-04-17 DIAGNOSIS — R69 Illness, unspecified: Secondary | ICD-10-CM

## 2015-04-17 DIAGNOSIS — G8929 Other chronic pain: Secondary | ICD-10-CM | POA: Insufficient documentation

## 2015-04-17 MED ORDER — ALBUTEROL SULFATE HFA 108 (90 BASE) MCG/ACT IN AERS
2.0000 | INHALATION_SPRAY | Freq: Four times a day (QID) | RESPIRATORY_TRACT | Status: DC
  Administered 2015-04-17: 2 via RESPIRATORY_TRACT
  Filled 2015-04-17: qty 6.7

## 2015-04-17 MED ORDER — GUAIFENESIN ER 1200 MG PO TB12: 1.0000 | ORAL_TABLET | Freq: Two times a day (BID) | ORAL | Status: DC

## 2015-04-17 MED ORDER — PROMETHAZINE-DM 6.25-15 MG/5ML PO SYRP
5.0000 mL | ORAL_SOLUTION | Freq: Four times a day (QID) | ORAL | Status: DC | PRN
Start: 2015-04-17 — End: 2015-12-02

## 2015-04-17 MED ORDER — PREDNISONE 50 MG PO TABS
50.0000 mg | ORAL_TABLET | Freq: Every day | ORAL | Status: DC
Start: 2015-04-17 — End: 2015-12-02

## 2015-04-17 NOTE — ED Notes (Signed)
Declined W/C at D/C and was escorted to lobby by RN. 

## 2015-04-17 NOTE — Discharge Instructions (Signed)
Return here as needed.  Follow-up with your primary care doctor.  Your chest x-ray did not show any signs of pneumonia have an influenza-like illness, and these can last for up to 2 weeks

## 2015-04-17 NOTE — ED Provider Notes (Signed)
CSN: [id number]     Arrival date & time 04/17/15  0818 History   First MD Initiated Contact with Patient 04/17/15 0845     Chief Complaint  Patient presents with  . URI     (Consider location/radiation/quality/duration/timing/severity/associated sxs/prior Treatment) HPI Patient presents to the emergency department with cough, nasal congestion, body aches over the last week.  The patient states that his daughter and wife are also sick with the same symptoms.  Patient states that he does have a history of sarcoidosis and his primary doctor placed him on antibiotics.  The patient states that nothing seems make her condition better or worse.  Patient denies chest pain, shortness breath, nausea, vomiting, weakness, dizziness, headache, blurred vision, back pain, dysuria, incontinence, bloody stool, hematemesis, abdominal pain, rash, near CVA or syncope.  The patient states that he did take some over-the-counter medications as well Past Medical History  Diagnosis Date  . Asthma     'I think I might have asthma"  . Sarcoidosis ([place])   . Dyspnea     chronic  . Chronic back pain    History reviewed. No pertinent past surgical history. Family History  Problem Relation Age of Onset  . Heart disease Maternal Grandfather   . Heart disease Paternal Uncle   . Prostate cancer Paternal Uncle    Social History  Substance Use Topics  . Smoking status: Former Smoker -- 1.00 packs/day for 20 years    Types: Cigarettes    Quit date: 01/13/2011  . Smokeless tobacco: Never Used  . Alcohol Use: No    Review of Systems  All other systems negative except as documented in the HPI. All pertinent positives and negatives as reviewed in the HPI.  Allergies  Percocet and Vicodin  Home Medications   Prior to Admission medications   Medication Sig Start Date End Date Taking? Authorizing Provider  aspirin EC 81 MG tablet Take 81 mg by mouth daily.    Historical Provider, MD  famotidine (PEPCID) 20 MG  tablet One PO at bedtime 12/18/14   Clayton Bibles, PA-C  meloxicam (MOBIC) 7.5 MG tablet Take 1 tablet (7.5 mg total) by mouth daily as needed for pain. 12/18/14   Clayton Bibles, PA-C  nabumetone (RELAFEN) 500 MG tablet Take 500 mg by mouth 2 (two) times daily.    Historical Provider, MD  predniSONE (DELTASONE) 10 MG tablet Take 1 tablet (10 mg total) by mouth daily with breakfast. 12/18/14   Clayton Bibles, PA-C  traMADol (ULTRAM) 50 MG tablet Take 1 tablet (50 mg total) by mouth every 6 (six) hours as needed. 11/09/14   Hope Bunnie Pion, NP  valACYclovir (VALTREX) 1000 MG tablet Take 1 tablet (1,000 mg total) by mouth 3 (three) times daily. 11/09/14   Hope Bunnie Pion, NP   BP 127/90 mmHg  Pulse 95  Temp(Src) 99.1 F (37.3 C) (Oral)  Resp 20  SpO2 99% Physical Exam  Constitutional: He is oriented to person, place, and time. He appears well-developed and well-nourished. No distress.  HENT:  Head: Normocephalic and atraumatic.  Mouth/Throat: Oropharynx is clear and moist.  Eyes: Pupils are equal, round, and reactive to light.  Neck: Normal range of motion. Neck supple.  Cardiovascular: Normal rate, regular rhythm and normal heart sounds.  Exam reveals no gallop and no friction rub.   No murmur heard. Pulmonary/Chest: Effort normal and breath sounds normal. No respiratory distress. He has no wheezes.  Abdominal: Soft. Bowel sounds are normal. He exhibits no distension. There  is no tenderness.  Neurological: He is alert and oriented to person, place, and time. He exhibits normal muscle tone. Coordination normal.  Skin: Skin is warm and dry. No rash noted. No erythema.  Psychiatric: He has a normal mood and affect. His behavior is normal.  Nursing note and vitals reviewed.   ED Course  Procedures (including critical care time) Labs Review Labs Reviewed - No data to display  Imaging Review Dg Chest 2 View  04/17/2015  CLINICAL DATA:  Sore throat, congestion and productive cough for the past week. History of  sarcoidosis. EXAM: CHEST  2 VIEW COMPARISON:  08/20/2014; 03/16/2014; 06/21/2013 ; 02/12/2011; chest CT - 02/13/2011 FINDINGS: Grossly unchanged enlarged cardiac silhouette and mediastinal contours with prominence of the bilateral pulmonary hila compatible with the hilar and mediastinal adenopathy demonstrated on remote chest CT performed 01/2011. Re- demonstrated mild diffuse slightly nodular thickening of the pulmonary interstitium, most conspicuous within the left upper and mid lung. There is persistent pleural parenchymal thickening about the right minor fissure. No pleural effusion or pneumothorax. No evidence of edema. No acute osseous abnormalities. IMPRESSION: Similar findings compatible with provided history of pulmonary sarcoidosis (grossly stable since 01/2011) without definite superimposed acute cardiopulmonary disease. Electronically Signed   By: Sandi Mariscal M.D.   On: 04/17/2015 09:33   I have personally reviewed and evaluated these images and lab results as part of my medical decision-making.  Patient be treated for influenza, viral URI.  Told to return here as needed.  Patient agrees the plan and all questions were answered.   Dalia Heading, PA-C 04/17/15 [id number]  Virgel Manifold, MD 04/18/15 [phone]

## 2015-04-17 NOTE — ED Notes (Signed)
Pt here with URI symptoms.

## 2015-06-20 ENCOUNTER — Encounter (HOSPITAL_COMMUNITY): Payer: Self-pay | Admitting: *Deleted

## 2015-06-20 ENCOUNTER — Emergency Department (HOSPITAL_COMMUNITY)
Admission: EM | Admit: 2015-06-20 | Discharge: 2015-06-20 | Disposition: A | Discharge status: Home / Self Care | Payer: Self-pay | Attending: Emergency Medicine | Admitting: Emergency Medicine

## 2015-06-20 DIAGNOSIS — G8929 Other chronic pain: Secondary | ICD-10-CM | POA: Insufficient documentation

## 2015-06-20 DIAGNOSIS — Z7982 Long term (current) use of aspirin: Secondary | ICD-10-CM | POA: Insufficient documentation

## 2015-06-20 DIAGNOSIS — M549 Dorsalgia, unspecified: Secondary | ICD-10-CM

## 2015-06-20 DIAGNOSIS — Z7952 Long term (current) use of systemic steroids: Secondary | ICD-10-CM | POA: Insufficient documentation

## 2015-06-20 DIAGNOSIS — Z87891 Personal history of nicotine dependence: Secondary | ICD-10-CM | POA: Insufficient documentation

## 2015-06-20 DIAGNOSIS — J45901 Unspecified asthma with (acute) exacerbation: Secondary | ICD-10-CM | POA: Insufficient documentation

## 2015-06-20 DIAGNOSIS — Z79899 Other long term (current) drug therapy: Secondary | ICD-10-CM | POA: Insufficient documentation

## 2015-06-20 DIAGNOSIS — M546 Pain in thoracic spine: Secondary | ICD-10-CM | POA: Insufficient documentation

## 2015-06-20 MED ORDER — NAPROXEN 500 MG PO TABS: 500.0000 mg | ORAL_TABLET | Freq: Two times a day (BID) | ORAL | Status: DC

## 2015-06-20 MED ORDER — METHOCARBAMOL 500 MG PO TABS
500.0000 mg | ORAL_TABLET | Freq: Two times a day (BID) | ORAL | Status: DC
Start: 2015-06-20 — End: 2015-12-02

## 2015-06-20 NOTE — ED Notes (Signed)
Declined W/C at D/C and was escorted to lobby by RN. 

## 2015-06-20 NOTE — ED Provider Notes (Signed)
CSN: [id number]     Arrival date & time 06/20/15  [id number] History  By signing my name below, I, Eustaquio Maize, attest that this documentation has been prepared under the direction and in the presence of Harlene Ramus, [place]. Electronically Signed: Eustaquio Maize, ED Scribe. 06/20/2015. 10:57 AM.   Chief Complaint  Patient presents with  . Back Pain   The history is provided by the patient. No language interpreter was used.     HPI Comments: Nathan [name] is a 45 y.o. male who presents to the Emergency Department complaining of gradual onset, constant, upper and lower back pain that began greater than 1 year ago, gradually worsening over the past few months. The pain varies from sharp to achy. It is exacerbated with movement. No known injury or trauma to the back. Pt reports having chronic back pain which he has been seen in the ED for multiple times in the past and notes this pain feels similar to his chronic pain. Pt has been taking Flexeril and Mobic without relief. Denies fever, chills, abdominal pain, cough, CP, SOB, urinary or bowel incontinence, saddle anesthesia, weakness, numbness, tingling, difficulty urinating, or any other associated symptoms. No hx cancer. No IVDA. No recent spinal manipulation.   Past Medical History  Diagnosis Date  . Asthma     'I think I might have asthma"  . Sarcoidosis ([place])   . Dyspnea     chronic  . Chronic back pain    History reviewed. No pertinent past surgical history. Family History  Problem Relation Age of Onset  . Heart disease Maternal Grandfather   . Heart disease Paternal Uncle   . Prostate cancer Paternal Uncle    Social History  Substance Use Topics  . Smoking status: Former Smoker -- 1.00 packs/day for 20 years    Types: Cigarettes    Quit date: 01/13/2011  . Smokeless tobacco: Never Used  . Alcohol Use: No    Review of Systems  Constitutional: Negative for fever and chills.  Respiratory: Positive for cough and shortness of  breath.   Cardiovascular: Positive for chest pain.  Gastrointestinal: Negative for abdominal pain.       Negative for bowel incontinence  Genitourinary: Negative for difficulty urinating.       Negative for urinary incontinence  Musculoskeletal: Positive for myalgias and back pain.  Neurological: Negative for weakness and numbness.       Negative for saddle anesthesia   Allergies  Percocet and Vicodin  Home Medications   Prior to Admission medications   Medication Sig Start Date End Date Taking? Authorizing Provider  aspirin EC 81 MG tablet Take 81 mg by mouth daily.    Historical Provider, MD  famotidine (PEPCID) 20 MG tablet One PO at bedtime 12/18/14   Clayton Bibles, PA-C  Guaifenesin 1200 MG TB12 Take 1 tablet (1,200 mg total) by mouth 2 (two) times daily. 04/17/15   Dalia Heading, PA-C  meloxicam (MOBIC) 7.5 MG tablet Take 1 tablet (7.5 mg total) by mouth daily as needed for pain. 12/18/14   Clayton Bibles, PA-C  methocarbamol (ROBAXIN) 500 MG tablet Take 1 tablet (500 mg total) by mouth 2 (two) times daily. 06/20/15   Nona Dell, PA-C  nabumetone (RELAFEN) 500 MG tablet Take 500 mg by mouth 2 (two) times daily.    Historical Provider, MD  naproxen (NAPROSYN) 500 MG tablet Take 1 tablet (500 mg total) by mouth 2 (two) times daily. 06/20/15   Nona Dell, PA-C  predniSONE (DELTASONE) 50 MG tablet Take 1 tablet (50 mg total) by mouth daily. 04/17/15   Dalia Heading, PA-C  promethazine-dextromethorphan (PROMETHAZINE-DM) 6.25-15 MG/5ML syrup Take 5 mLs by mouth 4 (four) times daily as needed for cough. 04/17/15   Dalia Heading, PA-C  traMADol (ULTRAM) 50 MG tablet Take 1 tablet (50 mg total) by mouth every 6 (six) hours as needed. 11/09/14   Hope Bunnie Pion, NP  valACYclovir (VALTREX) 1000 MG tablet Take 1 tablet (1,000 mg total) by mouth 3 (three) times daily. 11/09/14   Hope Bunnie Pion, NP   BP 122/86 mmHg  Pulse 82  Temp(Src) 98.5 F (36.9 C) (Oral)  Resp 16  Ht 5'  7" (1.702 m)  Wt 117.935 kg  BMI 40.71 kg/m2  SpO2 97%   Physical Exam  Constitutional: He is oriented to person, place, and time. He appears well-developed and well-nourished.  HENT:  Head: Normocephalic and atraumatic.  Eyes: Conjunctivae and EOM are normal. Right eye exhibits no discharge. Left eye exhibits no discharge. No scleral icterus.  Neck: Normal range of motion. Neck supple.  Cardiovascular: Normal rate, regular rhythm, normal heart sounds and intact distal pulses.   Pulmonary/Chest: Effort normal and breath sounds normal. No respiratory distress. He has no wheezes. He has no rales. He exhibits no tenderness.  Abdominal: Soft. Bowel sounds are normal. He exhibits no distension. There is no tenderness.  Musculoskeletal: Normal range of motion. He exhibits tenderness. He exhibits no edema.  No midline C, T, or L tenderness. Tenderness to palpation over left rhomboid with palpable muscle spasm. Bilateral upper trapezius and thoracic paraspinal muscles are TTP. Full range of motion of neck and back. Full range of motion of bilateral upper and lower extremities, with 5/5 strength. Sensation intact. 2+ radial and PT pulses. Cap refill <2 seconds. Patient able to stand and ambulate without assistance.   Neurological: He is alert and oriented to person, place, and time. He has normal strength and normal reflexes. No sensory deficit. Gait normal.  Skin: Skin is warm and dry.  Nursing note and vitals reviewed.   ED Course  Procedures (including critical care time)  DIAGNOSTIC STUDIES: Oxygen Saturation is 97% on RA, normal by my interpretation.    COORDINATION OF CARE: 10:52 AM-Discussed treatment plan with pt at bedside and pt agreed to plan.   Labs Review Labs Reviewed - No data to display  Imaging Review No results found.   EKG Interpretation None      MDM   Final diagnoses:  Upper back pain    Patient with back pain.  No neurological deficits and normal neuro  exam.  Patient can walk but states is painful.  No loss of bowel or bladder control.  No concern for cauda equina.  No fever, night sweats, weight loss, h/o cancer, IVDU.  Pain appears to be consistent with patient's chronic back pain. I do not feel any further workup or imaging is warranted at this time. Plan to discharge patient home with RICE protocol and pain medicine. Advised patient to follow up with his PCP regarding further management of his chronic back pain. Discussed strict return precautions with patient.  I personally performed the services described in this documentation, which was scribed in my presence. The recorded information has been reviewed and is accurate.      Chesley Noon [place], [place] 06/20/15 [place], MD 06/23/15 1640

## 2015-06-20 NOTE — Discharge Instructions (Signed)
Take your medications as prescribed as needed for pain control. I recommend applying heat to affected area for 15-20 minutes 3-4 times daily to help with pain. Refrain from doing any heavy lifting, exacerbating movements or repetitive movements that worsens your pain. Follow-up with your primary care provider in the next week regarding further management of her chronic back pain. Please return to the Emergency Department if symptoms worsen or new onset of fever, numbness, tingling, groin numbness, abdominal pain, urinary retention, loss of bowel or bladder, weakness.

## 2015-06-20 NOTE — ED Notes (Signed)
PT reports back pain for more than a month.

## 2015-12-01 ENCOUNTER — Encounter (HOSPITAL_COMMUNITY): Payer: Self-pay | Admitting: Family Medicine

## 2015-12-01 ENCOUNTER — Emergency Department (HOSPITAL_COMMUNITY): Payer: Self-pay

## 2015-12-01 DIAGNOSIS — Z7982 Long term (current) use of aspirin: Secondary | ICD-10-CM | POA: Insufficient documentation

## 2015-12-01 DIAGNOSIS — M5412 Radiculopathy, cervical region: Secondary | ICD-10-CM | POA: Insufficient documentation

## 2015-12-01 DIAGNOSIS — J45909 Unspecified asthma, uncomplicated: Secondary | ICD-10-CM | POA: Insufficient documentation

## 2015-12-01 DIAGNOSIS — Z87891 Personal history of nicotine dependence: Secondary | ICD-10-CM | POA: Insufficient documentation

## 2015-12-01 LAB — BASIC METABOLIC PANEL
Anion gap: 7 (ref 5–15)
BUN: 11 mg/dL (ref 6–20)
CO2: 26 mmol/L (ref 22–32)
Calcium: 8.9 mg/dL (ref 8.9–10.3)
Chloride: 108 mmol/L (ref 101–111)
Creatinine, Ser: 0.6 mg/dL — ABNORMAL LOW (ref 0.61–1.24)
GFR calc Af Amer: >60 mL/min (ref >60)
GFR calc non Af Amer: >60 mL/min (ref >60)
Glucose, Bld: 97 mg/dL (ref 65–99)
Potassium: 4 mmol/L (ref 3.5–5.1)
Sodium: 141 mmol/L (ref 135–145)

## 2015-12-01 LAB — CBC
HCT: 44.5 % (ref 39.0–52.0)
Hemoglobin: 15.4 g/dL (ref 13.0–17.0)
MCH: 29 pg (ref 26.0–34.0)
MCHC: 34.6 g/dL (ref 30.0–36.0)
MCV: 83.8 fL (ref 78.0–100.0)
Platelets: 276 10*3/uL (ref 150–400)
RBC: 5.31 MIL/uL (ref 4.22–5.81)
RDW: 12.8 % (ref 11.5–15.5)
WBC: 5.4 10*3/uL (ref 4.0–10.5)

## 2015-12-01 LAB — I-STAT TROPONIN, ED: Troponin i, poc: 0.01 ng/mL (ref 0.00–0.08)

## 2015-12-01 NOTE — ED Triage Notes (Signed)
Patient reports he has cervical and thoracic pain for about 2-3 years. He reports intermittently the back pain will radiate to his chest as he feels since last night. He reports he is very active at work and it may irritate the back/chest pain. Pt has not took any medication for pain.

## 2015-12-02 ENCOUNTER — Emergency Department (HOSPITAL_COMMUNITY)
Admission: EM | Admit: 2015-12-02 | Discharge: 2015-12-02 | Disposition: A | Discharge status: Home / Self Care | Payer: Self-pay | Attending: Emergency Medicine | Admitting: Emergency Medicine

## 2015-12-02 DIAGNOSIS — M5412 Radiculopathy, cervical region: Secondary | ICD-10-CM

## 2015-12-02 MED ORDER — TRAMADOL HCL 50 MG PO TABS: 50.0000 mg | ORAL_TABLET | Freq: Four times a day (QID) | ORAL | 0 refills | Status: DC | PRN

## 2015-12-02 MED ORDER — CYCLOBENZAPRINE HCL 10 MG PO TABS: 10.0000 mg | ORAL_TABLET | Freq: Three times a day (TID) | ORAL | 0 refills | Status: DC | PRN

## 2015-12-02 MED ORDER — PREDNISONE 20 MG PO TABS: ORAL_TABLET | ORAL | 0 refills | Status: DC

## 2015-12-02 NOTE — ED Notes (Signed)
Patient verbalizes understanding of discharge instructions, prescriptions, home care and follow up care. Patient out of department at this time. 

## 2015-12-02 NOTE — ED Provider Notes (Signed)
[facility] DEPT Provider Note   CSN: [id number] Arrival date & time: 12/01/15  2109  By signing my name below, I, Dora Sims, attest that this documentation has been prepared under the direction and in the presence of physician practitioner, Orpah Greek, MD. Electronically Signed: Dora Sims, Scribe. 12/02/2015. 2:11 AM.  History   Chief Complaint Chief Complaint  Patient presents with  . Chest Pain  . Back Pain    The history is provided by the patient. No language interpreter was used.     HPI Comments: Nathan [name] is a 45 y.o. male with PMHx significant for chronic back pain and sarcoidosis who presents to the Emergency Department complaining of constant upper back pain radiating into his upper chest for the last two days. Pt reports he has experienced constant cervical and thoracic spine pain for 2-3 years and notes a h/o of sarcoidosis. Pt reports he has experienced intermittent pain radiation into his chest for the last 2-3 years, but the chest pain has not been constant like it has for the last two days. He also notes some intermittent pain in his right shoulder that is associated with his neck/back pain. He endorses neck/back pain exacerbation with neck movement. Pt states he is very active at work and believes this could be exacerbating his neck and back pains. He denies numbness, weakness, or any other associated symptoms.  Past Medical History:  Diagnosis Date  . Asthma    'I think I might have asthma"  . Chronic back pain   . Dyspnea    chronic  . Sarcoidosis [facility])     Patient Active Problem List   Diagnosis Date Noted  . Other chest pain 08/22/2014  . Morbid obesity ([place]) 07/24/2014  . Upper airway cough syndrome 07/23/2014  . Sarcoidosis ([place]) 03/06/2011    History reviewed. No pertinent surgical history.     Home Medications    Prior to Admission medications   Medication Sig Start Date End Date Taking? Authorizing Provider    aspirin EC 81 MG tablet Take 81 mg by mouth daily.   Yes Historical Provider, MD  cyclobenzaprine (FLEXERIL) 10 MG tablet Take 1 tablet (10 mg total) by mouth 3 (three) times daily as needed for muscle spasms. 12/02/15   Orpah Greek, MD  predniSONE (DELTASONE) 20 MG tablet 3 tabs po daily x 3 days, then 2 tabs x 3 days, then 1.5 tabs x 3 days, then 1 tab x 3 days, then 0.5 tabs x 3 days 12/02/15   Orpah Greek, MD  traMADol (ULTRAM) 50 MG tablet Take 1 tablet (50 mg total) by mouth every 6 (six) hours as needed. 12/02/15   Orpah Greek, MD    Family History Family History  Problem Relation Age of Onset  . Heart disease Maternal Grandfather   . Heart disease Paternal Uncle   . Prostate cancer Paternal Uncle     Social History Social History  Substance Use Topics  . Smoking status: Former Smoker    Packs/day: 1.00    Years: 20.00    Types: Cigarettes    Quit date: 01/13/2011  . Smokeless tobacco: Never Used  . Alcohol use No     Allergies   Percocet [oxycodone-acetaminophen] and Vicodin [hydrocodone-acetaminophen]   Review of Systems Review of Systems  Cardiovascular: Positive for chest pain (upper).  Musculoskeletal: Positive for back pain (thoracic spine), myalgias (right shoulder, intermittent) and neck pain (cervical spine).  Neurological: Negative for weakness and numbness.  All other systems reviewed and are negative.   Physical Exam Updated Vital Signs BP 114/84 (BP Location: Right Arm)   Pulse 70   Temp 97.4 F (36.3 C) (Oral)   Resp 16   Ht 5\' 7"  (1.702 m)   Wt 230 lb (104.3 kg)   SpO2 99%   BMI 36.02 kg/m   Physical Exam  Constitutional: He is oriented to person, place, and time. He appears well-developed and well-nourished. No distress.  HENT:  Head: Normocephalic and atraumatic.  Right Ear: Hearing normal.  Left Ear: Hearing normal.  Nose: Nose normal.  Mouth/Throat: Oropharynx is clear and moist and mucous membranes  are normal.  Eyes: Conjunctivae and EOM are normal. Pupils are equal, round, and reactive to light.  Neck: Normal range of motion. Neck supple.  Cardiovascular: Regular rhythm, S1 normal and S2 normal.  Exam reveals no gallop and no friction rub.   No murmur heard. Pulmonary/Chest: Effort normal and breath sounds normal. No respiratory distress. He exhibits no tenderness.  Abdominal: Soft. Normal appearance and bowel sounds are normal. There is no hepatosplenomegaly. There is no tenderness. There is no rebound, no guarding, no tenderness at McBurney's point and negative Murphy's sign. No hernia.  Musculoskeletal: Normal range of motion. He exhibits tenderness.  Paraspinal cervical and upper thoracic tenderness.  Neurological: He is alert and oriented to person, place, and time. He has normal strength. No cranial nerve deficit or sensory deficit. Coordination normal. GCS eye subscore is 4. GCS verbal subscore is 5. GCS motor subscore is 6.  Skin: Skin is warm, dry and intact. No rash noted. No cyanosis.  Psychiatric: He has a normal mood and affect. His speech is normal and behavior is normal. Thought content normal.  Nursing note and vitals reviewed.   ED Treatments / Results  Labs (all labs ordered are listed, but only abnormal results are displayed) Labs Reviewed  BASIC METABOLIC PANEL - Abnormal; Notable for the following:       Result Value   Creatinine, Ser 0.60 (*)    All other components within normal limits  CBC  I-STAT TROPOININ, ED    EKG  EKG Interpretation  Date/Time:  Wednesday December 01 2015 21:31:32 EDT Ventricular Rate:  66 PR Interval:    QRS Duration: 98 QT Interval:  390 QTC Calculation: 409 R Axis:   -12 Text Interpretation:  Sinus rhythm Borderline prolonged PR interval Otherwise within normal limits Confirmed by POLLINA  MD, CHRISTOPHER [phone]) on 12/02/2015 3:34:18 AM       Radiology Dg Chest 2 View  Result Date: 12/01/2015 CLINICAL DATA:  Upper  back pain beginning yesterday, radiating to the anterior chest. History of sarcoid and smoking. EXAM: CHEST  2 VIEW COMPARISON:  04/17/2015.  08/20/2014. FINDINGS: Heart size is normal. There is chronic hilar enlargement and chronic pulmonary scarring, both of which appear unchanged from previous exams. No sign of active infiltrate, mass, effusion or collapse. No significant bone finding. IMPRESSION: No change. Chronic hilar prominence. Chronic pulmonary scarring. Findings are consistent with chronic sarcoid. No active finding. Electronically Signed   By: Nelson Chimes M.D.   On: 12/01/2015 23:11    Procedures Procedures (including critical care time)  DIAGNOSTIC STUDIES: Oxygen Saturation is 99% on RA, normal by my interpretation.    COORDINATION OF CARE: 2:19 AM Discussed treatment plan with pt at bedside and pt agreed to plan.  Medications Ordered in ED Medications - No data to display   Initial Impression / Assessment and Plan /  ED Course  I have reviewed the triage vital signs and the nursing notes.  Pertinent labs & imaging results that were available during my care of the patient were reviewed by me and considered in my medical decision making (see chart for details).  Clinical Course  Patient resents with complaints of neck pain, upper back pain and intermittent chest pain. Patient reports that he has had problems with his back for some time. He has noticed increased pain just above the shoulder blades and up into the neck that worsens with movement. Occasionally the pain radiates to the shoulders but not down the arms. No weakness, numbness or tingling in the arms. Recently has noticed intermittent sharp pain in the upper chest that comes and goes. No associated shortness of breath.  Cardiac workupup is unrevealing. Symptoms are very atypical, not felt to be consistent with cardiac etiology. Symptoms most consistent with cervical radiculopathy. Patient will be treated empirically for  cervical radiculopathy, follow-up with PCP.  I personally performed the services described in this documentation, which was scribed in my presence. The recorded information has been reviewed and is accurate.   Final Clinical Impressions(s) / ED Diagnoses   Final diagnoses:  Cervical radiculopathy    New Prescriptions New Prescriptions   CYCLOBENZAPRINE (FLEXERIL) 10 MG TABLET    Take 1 tablet (10 mg total) by mouth 3 (three) times daily as needed for muscle spasms.   PREDNISONE (DELTASONE) 20 MG TABLET    3 tabs po daily x 3 days, then 2 tabs x 3 days, then 1.5 tabs x 3 days, then 1 tab x 3 days, then 0.5 tabs x 3 days   TRAMADOL (ULTRAM) 50 MG TABLET    Take 1 tablet (50 mg total) by mouth every 6 (six) hours as needed.     Orpah Greek, MD 12/02/15 [phone]

## 2016-01-03 ENCOUNTER — Encounter (HOSPITAL_COMMUNITY): Payer: Self-pay | Admitting: Emergency Medicine

## 2016-01-03 ENCOUNTER — Emergency Department (HOSPITAL_COMMUNITY): Payer: Self-pay

## 2016-01-03 ENCOUNTER — Emergency Department (HOSPITAL_COMMUNITY)
Admission: EM | Admit: 2016-01-03 | Discharge: 2016-01-03 | Disposition: A | Discharge status: Home / Self Care | Payer: Self-pay | Attending: Emergency Medicine | Admitting: Emergency Medicine

## 2016-01-03 DIAGNOSIS — J209 Acute bronchitis, unspecified: Secondary | ICD-10-CM

## 2016-01-03 DIAGNOSIS — M546 Pain in thoracic spine: Secondary | ICD-10-CM | POA: Insufficient documentation

## 2016-01-03 DIAGNOSIS — Z87891 Personal history of nicotine dependence: Secondary | ICD-10-CM | POA: Insufficient documentation

## 2016-01-03 DIAGNOSIS — J40 Bronchitis, not specified as acute or chronic: Secondary | ICD-10-CM | POA: Insufficient documentation

## 2016-01-03 DIAGNOSIS — J9801 Acute bronchospasm: Secondary | ICD-10-CM | POA: Insufficient documentation

## 2016-01-03 DIAGNOSIS — Z7982 Long term (current) use of aspirin: Secondary | ICD-10-CM | POA: Insufficient documentation

## 2016-01-03 MED ORDER — IBUPROFEN 600 MG PO TABS: 600.0000 mg | ORAL_TABLET | Freq: Three times a day (TID) | ORAL | 0 refills | Status: DC | PRN

## 2016-01-03 MED ORDER — PREDNISONE 20 MG PO TABS
60.0000 mg | ORAL_TABLET | Freq: Once | ORAL | Status: AC
  Administered 2016-01-03: 60 mg via ORAL
  Filled 2016-01-03: qty 3

## 2016-01-03 MED ORDER — IBUPROFEN 200 MG PO TABS
600.0000 mg | ORAL_TABLET | Freq: Once | ORAL | Status: AC
  Administered 2016-01-03: 600 mg via ORAL
  Filled 2016-01-03: qty 3

## 2016-01-03 MED ORDER — ALBUTEROL SULFATE HFA 108 (90 BASE) MCG/ACT IN AERS
2.0000 | INHALATION_SPRAY | Freq: Once | RESPIRATORY_TRACT | Status: AC
  Administered 2016-01-03: 2 via RESPIRATORY_TRACT
  Filled 2016-01-03: qty 6.7

## 2016-01-03 MED ORDER — PREDNISONE 20 MG PO TABS: 40.0000 mg | ORAL_TABLET | Freq: Every day | ORAL | 0 refills | Status: DC

## 2016-01-03 MED ORDER — CYCLOBENZAPRINE HCL 10 MG PO TABS: 10.0000 mg | ORAL_TABLET | Freq: Three times a day (TID) | ORAL | 0 refills | Status: DC | PRN

## 2016-01-03 NOTE — ED Triage Notes (Signed)
Pt was diagnosed with sarcoidosis about 3 years ago.  Comes in today with a dry cough, SOB, and back pain. Ambulatory during triage and to room. Denies home oxygen use but does use an inhaler.

## 2016-01-04 ENCOUNTER — Emergency Department (HOSPITAL_COMMUNITY)
Admission: EM | Admit: 2016-01-04 | Discharge: 2016-01-04 | Disposition: A | Discharge status: Home / Self Care | Payer: Self-pay | Attending: Emergency Medicine | Admitting: Emergency Medicine

## 2016-01-04 ENCOUNTER — Encounter (HOSPITAL_COMMUNITY): Payer: Self-pay | Admitting: Emergency Medicine

## 2016-01-04 DIAGNOSIS — Z87891 Personal history of nicotine dependence: Secondary | ICD-10-CM | POA: Insufficient documentation

## 2016-01-04 DIAGNOSIS — K0381 Cracked tooth: Secondary | ICD-10-CM | POA: Insufficient documentation

## 2016-01-04 DIAGNOSIS — Z79899 Other long term (current) drug therapy: Secondary | ICD-10-CM | POA: Insufficient documentation

## 2016-01-04 DIAGNOSIS — Z7982 Long term (current) use of aspirin: Secondary | ICD-10-CM | POA: Insufficient documentation

## 2016-01-04 DIAGNOSIS — J029 Acute pharyngitis, unspecified: Secondary | ICD-10-CM | POA: Insufficient documentation

## 2016-01-04 DIAGNOSIS — K0889 Other specified disorders of teeth and supporting structures: Secondary | ICD-10-CM

## 2016-01-04 DIAGNOSIS — J45909 Unspecified asthma, uncomplicated: Secondary | ICD-10-CM | POA: Insufficient documentation

## 2016-01-04 LAB — RAPID STREP SCREEN (MED CTR MEBANE ONLY): Streptococcus, Group A Screen (Direct): NEGATIVE

## 2016-01-04 NOTE — ED Provider Notes (Signed)
[facility] DEPT Provider Note   CSN: [id number] Arrival date & time: 01/03/16  1644     History   Chief Complaint Chief Complaint  Patient presents with  . Shortness of Breath  . Back Pain    HPI Nathan [name] is a 45 y.o. male.  HPI Patient has a history of sarcoidosis and presents the emergency department for complaints of left lateral thoracic back pain.  States he also has a dry cough and some occasional shortness of breath.  No pleuritic nature to his pain.  He reports no shortness of breath when he ambulates.  He has tried an inhaler but does not remember the name of this and reports only intermittent relief.  He previously was managed with anti-inflammatories and tramadol but is currently working on obtaining a new physician.  He states his had this discomfort and pain before and usually improves with course of steroids as it tends to be related to his sarcoid.   Past Medical History:  Diagnosis Date  . Asthma    'I think I might have asthma"  . Chronic back pain   . Dyspnea    chronic  . Sarcoidosis [facility])     Patient Active Problem List   Diagnosis Date Noted  . Other chest pain 08/22/2014  . Morbid obesity ([place]) 07/24/2014  . Upper airway cough syndrome 07/23/2014  . Sarcoidosis ([place]) 03/06/2011    History reviewed. No pertinent surgical history.     Home Medications    Prior to Admission medications   Medication Sig Start Date End Date Taking? Authorizing Provider  aspirin EC 81 MG tablet Take 81 mg by mouth daily.   Yes Historical Provider, MD  cyclobenzaprine (FLEXERIL) 10 MG tablet Take 1 tablet (10 mg total) by mouth 3 (three) times daily as needed for muscle spasms. 01/03/16   Jola Schmidt, MD  ibuprofen (ADVIL,MOTRIN) 600 MG tablet Take 1 tablet (600 mg total) by mouth every 8 (eight) hours as needed. 01/03/16   Jola Schmidt, MD  predniSONE (DELTASONE) 20 MG tablet Take 2 tablets (40 mg total) by mouth daily. 01/03/16   Jola Schmidt, MD    traMADol (ULTRAM) 50 MG tablet Take 1 tablet (50 mg total) by mouth every 6 (six) hours as needed. Patient not taking: Reported on 01/03/2016 12/02/15   Orpah Greek, MD    Family History Family History  Problem Relation Age of Onset  . Heart disease Maternal Grandfather   . Heart disease Paternal Uncle   . Prostate cancer Paternal Uncle     Social History Social History  Substance Use Topics  . Smoking status: Former Smoker    Packs/day: 1.00    Years: 20.00    Types: Cigarettes    Quit date: 01/13/2011  . Smokeless tobacco: Never Used  . Alcohol use No     Allergies   Percocet [oxycodone-acetaminophen] and Vicodin [hydrocodone-acetaminophen]   Review of Systems Review of Systems  All other systems reviewed and are negative.    Physical Exam Updated Vital Signs BP 132/85 (BP Location: Left Arm)   Pulse 68   Temp 98.6 F (37 C) (Oral)   Resp 18   Ht 5\' 7"  (1.702 m)   Wt 225 lb (102.1 kg)   SpO2 95%   BMI 35.24 kg/m   Physical Exam  Constitutional: He is oriented to person, place, and time. He appears well-developed and well-nourished.  HENT:  Head: Normocephalic and atraumatic.  Eyes: EOM are normal.  Neck: Normal  range of motion.  Cardiovascular: Normal rate, regular rhythm and normal heart sounds.   Pulmonary/Chest: Effort normal and breath sounds normal. No respiratory distress.  Abdominal: Soft. He exhibits no distension. There is no tenderness.  Musculoskeletal: Normal range of motion.  No thoracic or lumbar tenderness.  Normal strength in arms and legs.  Mild tenderness of the left parathoracic region without skin rash noted  Neurological: He is alert and oriented to person, place, and time.  Skin: Skin is warm and dry.  Psychiatric: He has a normal mood and affect. Judgment normal.  Nursing note and vitals reviewed.    ED Treatments / Results  Labs (all labs ordered are listed, but only abnormal results are displayed) Labs  Reviewed - No data to display  EKG  EKG Interpretation None       Radiology Dg Chest 2 View  Result Date: 01/03/2016 CLINICAL DATA:  Shortness of breath.  Sarcoidosis. EXAM: CHEST  2 VIEW COMPARISON:  12/01/2015.  08/20/2014. FINDINGS: Heart size is normal. Bilateral hilar fullness noted consistent with adenopathy this patient with known sarcoidosis. This is stable. Bibasilar and biapical pleural parenchymal thickening consistent with scarring again noted. No effusion or pneumothorax . No acute bony abnormality . IMPRESSION: 1.  Bilateral pleural-parenchymal scarring. 2. Stable bilateral hilar fullness consistent with stable lymph nodes in this patient with known sarcoidosis. Electronically Signed   By: Marcello Moores  Register   On: 01/03/2016 17:31    Procedures Procedures (including critical care time)  Medications Ordered in ED Medications  albuterol (PROVENTIL HFA;VENTOLIN HFA) 108 (90 Base) MCG/ACT inhaler 2 puff (2 puffs Inhalation Given 01/03/16 1804)  predniSONE (DELTASONE) tablet 60 mg (60 mg Oral Given 01/03/16 1803)  ibuprofen (ADVIL,MOTRIN) tablet 600 mg (600 mg Oral Given 01/03/16 1803)     Initial Impression / Assessment and Plan / ED Course  I have reviewed the triage vital signs and the nursing notes.  Pertinent labs & imaging results that were available during my care of the patient were reviewed by me and considered in my medical decision making (see chart for details).  Clinical Course     Patient feels much better after treatment in the emergency department.  Doubt pulmonary embolus.  No hypoxia.  Home with anti-inflammatories and muscle relaxants.  Home with a short course of steroids.  Final Clinical Impressions(s) / ED Diagnoses   Final diagnoses:  Acute left-sided thoracic back pain  Bronchitis with bronchospasm    New Prescriptions Discharge Medication List as of 01/03/2016  8:33 PM    START taking these medications   Details  ibuprofen  (ADVIL,MOTRIN) 600 MG tablet Take 1 tablet (600 mg total) by mouth every 8 (eight) hours as needed., Starting Mon 01/03/2016, Print         Jola Schmidt, MD 01/04/16 0130

## 2016-01-04 NOTE — ED Provider Notes (Signed)
[facility] DEPT Provider Note    By signing my name below, I, Bea Graff, attest that this documentation has been prepared under the direction and in the presence of Etta Quill, [place]. Electronically Signed: Bea Graff, ED Scribe. 01/04/16. 6:54 PM.   History   Chief Complaint Chief Complaint  Patient presents with  . Sore Throat   The history is provided by the patient and medical records. No language interpreter was used.    HPI Comments:  Nathan [name] is a 45 y.o. male with PMHx of sarcoidosis who presents to the Emergency Department complaining of right-sided throat pain that began two days ago. Pt reports associated right ear pain, subjective fever, rhinorrhea and sneezing. He states he was seen here yesterday and prescribed an MDI and Prednisone for SOB. He reports his wife has been sick and his bronchitis "is acting up". He has not taken anything for his symptoms. Swallowing increases his pain. He denies alleviating factors. He denies chills, nausea, vomiting.   Past Medical History:  Diagnosis Date  . Asthma    'I think I might have asthma"  . Chronic back pain   . Dyspnea    chronic  . Sarcoidosis [facility])     Patient Active Problem List   Diagnosis Date Noted  . Other chest pain 08/22/2014  . Morbid obesity ([place]) 07/24/2014  . Upper airway cough syndrome 07/23/2014  . Sarcoidosis ([place]) 03/06/2011    History reviewed. No pertinent surgical history.     Home Medications    Prior to Admission medications   Medication Sig Start Date End Date Taking? Authorizing Provider  aspirin EC 81 MG tablet Take 81 mg by mouth daily.    Historical Provider, MD  cyclobenzaprine (FLEXERIL) 10 MG tablet Take 1 tablet (10 mg total) by mouth 3 (three) times daily as needed for muscle spasms. 01/03/16   Jola Schmidt, MD  ibuprofen (ADVIL,MOTRIN) 600 MG tablet Take 1 tablet (600 mg total) by mouth every 8 (eight) hours as needed. 01/03/16   Jola Schmidt, MD    predniSONE (DELTASONE) 20 MG tablet Take 2 tablets (40 mg total) by mouth daily. 01/03/16   Jola Schmidt, MD  traMADol (ULTRAM) 50 MG tablet Take 1 tablet (50 mg total) by mouth every 6 (six) hours as needed. Patient not taking: Reported on 01/03/2016 12/02/15   Orpah Greek, MD    Family History Family History  Problem Relation Age of Onset  . Heart disease Maternal Grandfather   . Heart disease Paternal Uncle   . Prostate cancer Paternal Uncle     Social History Social History  Substance Use Topics  . Smoking status: Former Smoker    Packs/day: 1.00    Years: 20.00    Types: Cigarettes    Quit date: 01/13/2011  . Smokeless tobacco: Never Used  . Alcohol use No     Allergies   Percocet [oxycodone-acetaminophen] and Vicodin [hydrocodone-acetaminophen]   Review of Systems Review of Systems  Constitutional: Positive for fever (subjective). Negative for chills.  HENT: Positive for ear pain, rhinorrhea, sneezing and sore throat.   Gastrointestinal: Negative for nausea and vomiting.  All other systems reviewed and are negative.    Physical Exam Updated Vital Signs BP 134/92 (BP Location: Left Arm)   Pulse 77   Temp 98 F (36.7 C)   Resp 18   SpO2 98%   Physical Exam  Constitutional: He is oriented to person, place, and time. He appears well-developed and well-nourished.  HENT:  Head: Normocephalic and atraumatic.  Right Ear: Tympanic membrane normal.  Left Ear: Tympanic membrane normal.  Mouth/Throat: Uvula is midline and mucous membranes are normal. No trismus in the jaw. Abnormal dentition. No dental abscesses. Posterior oropharyngeal erythema present. No oropharyngeal exudate, posterior oropharyngeal edema or tonsillar abscesses.  Fractured first molar on lower right side. Does not appear to be infected.  Neck: Normal range of motion.  Cardiovascular: Normal rate, regular rhythm and normal heart sounds.  Exam reveals no gallop and no friction rub.    No murmur heard. Pulmonary/Chest: Effort normal and breath sounds normal. No respiratory distress. He has no wheezes. He has no rales.  Musculoskeletal: Normal range of motion.  Neurological: He is alert and oriented to person, place, and time.  Skin: Skin is warm and dry.  Psychiatric: He has a normal mood and affect. His behavior is normal.  Nursing note and vitals reviewed.    ED Treatments / Results  DIAGNOSTIC STUDIES: Oxygen Saturation is 98% on RA, normal by my interpretation.   COORDINATION OF CARE: 5:29 PM- Will check rapid strep test. Pt verbalizes understanding and agrees to plan.  6:53 PM- Went in to inform pt of negative strep results. Pt asked at that time what he could do for tooth pain. Examined pt's tooth at that time.  Medications - No data to display  Labs (all labs ordered are listed, but only abnormal results are displayed) Labs Reviewed  RAPID STREP SCREEN (NOT AT [facility])  CULTURE, GROUP A STREP [facility])    EKG  EKG Interpretation None       Radiology Dg Chest 2 View  Result Date: 01/03/2016 CLINICAL DATA:  Shortness of breath.  Sarcoidosis. EXAM: CHEST  2 VIEW COMPARISON:  12/01/2015.  08/20/2014. FINDINGS: Heart size is normal. Bilateral hilar fullness noted consistent with adenopathy this patient with known sarcoidosis. This is stable. Bibasilar and biapical pleural parenchymal thickening consistent with scarring again noted. No effusion or pneumothorax . No acute bony abnormality . IMPRESSION: 1.  Bilateral pleural-parenchymal scarring. 2. Stable bilateral hilar fullness consistent with stable lymph nodes in this patient with known sarcoidosis. Electronically Signed   By: Marcello Moores  Register   On: 01/03/2016 17:31    Procedures Procedures (including critical care time)  Medications Ordered in ED Medications - No data to display   Initial Impression / Assessment and Plan / ED Course  I have reviewed the triage vital signs and the nursing  notes.  Pertinent labs & imaging results that were available during my care of the patient were reviewed by me and considered in my medical decision making (see chart for details).  Clinical Course     Pt with negative strep. Diagnosis of viral pharyngitis. No abx indicated at this time. Discussed that results of strep culture are pending and patient will be informed if positive result and abx will be called in at that time. Discharge with symptomatic tx. No evidence of dehydration. Pt is tolerating secretions. Presentation not concerning for peritonsillar abscess or spread of infection to deep spaces of the throat; patent airway. Specific return precautions discussed. Recommended PCP follow up. Pt appears safe for discharge.  Patient with dentalgia.  No abscess requiring immediate incision and drainage.  Exam not concerning for Ludwig's angina or pharyngeal abscess. Instructed pt to take OTC Aleve for pain. Pt instructed to follow-up with dentist.  Discussed return precautions. Pt safe for discharge.  I personally performed the services described in this documentation, which was scribed in my presence.  The recorded information has been reviewed and is accurate.   Final Clinical Impressions(s) / ED Diagnoses   Final diagnoses:  Viral pharyngitis  Pain, dental    New Prescriptions New Prescriptions   No medications on file     Etta Quill, NP 01/04/16 [place], MD 01/05/16 [id number]

## 2016-01-07 LAB — CULTURE, GROUP A STREP (THRC)

## 2016-01-08 ENCOUNTER — Emergency Department (HOSPITAL_COMMUNITY)
Admission: EM | Admit: 2016-01-08 | Discharge: 2016-01-08 | Disposition: A | Discharge status: Home / Self Care | Payer: Self-pay | Attending: Emergency Medicine | Admitting: Emergency Medicine

## 2016-01-08 ENCOUNTER — Encounter (HOSPITAL_COMMUNITY): Payer: Self-pay | Admitting: Nurse Practitioner

## 2016-01-08 DIAGNOSIS — K029 Dental caries, unspecified: Secondary | ICD-10-CM | POA: Insufficient documentation

## 2016-01-08 DIAGNOSIS — Z7982 Long term (current) use of aspirin: Secondary | ICD-10-CM | POA: Insufficient documentation

## 2016-01-08 DIAGNOSIS — Z79899 Other long term (current) drug therapy: Secondary | ICD-10-CM | POA: Insufficient documentation

## 2016-01-08 DIAGNOSIS — Z87891 Personal history of nicotine dependence: Secondary | ICD-10-CM | POA: Insufficient documentation

## 2016-01-08 DIAGNOSIS — K0889 Other specified disorders of teeth and supporting structures: Secondary | ICD-10-CM

## 2016-01-08 MED ORDER — AMOXICILLIN-POT CLAVULANATE 875-125 MG PO TABS
1.0000 | ORAL_TABLET | Freq: Two times a day (BID) | ORAL | 0 refills | Status: DC
Start: 2016-01-08 — End: 2016-09-21

## 2016-01-08 NOTE — Discharge Instructions (Signed)
Continue Naproxen and Tylenol Start antibiotics Please see a dentist for management of cavities

## 2016-01-08 NOTE — ED Provider Notes (Signed)
[facility] DEPT Provider Note   CSN: [id number] Arrival date & time: 01/08/16  1027     History   Chief Complaint Chief Complaint  Patient presents with  . Oral Swelling  . Otalgia    HPI Nathan [name] is a 45 y.o. male who presents with right sided dental pain and jaw swelling. He was last seen on 11/21 for a sore throat and was diagnosed with viral pharyngitis. He also was complaining of right lower molar pain as well and was instructed to take Naproxen which has been taking twice daily. He states his symptoms have been worsening and he has developed swelling which started two days ago. The pain radiates to the right ear. Denies fever, drooling, difficulty swallowing. His throat has gotten better. He does not have a Pharmacist, community.  HPI  Past Medical History:  Diagnosis Date  . Asthma    'I think I might have asthma"  . Chronic back pain   . Dyspnea    chronic  . Sarcoidosis [facility])     Patient Active Problem List   Diagnosis Date Noted  . Other chest pain 08/22/2014  . Morbid obesity ([place]) 07/24/2014  . Upper airway cough syndrome 07/23/2014  . Sarcoidosis ([place]) 03/06/2011    History reviewed. No pertinent surgical history.     Home Medications    Prior to Admission medications   Medication Sig Start Date End Date Taking? Authorizing Provider  amoxicillin-clavulanate (AUGMENTIN) 875-125 MG tablet Take 1 tablet by mouth every 12 (twelve) hours. 01/08/16   Recardo Evangelist, PA-C  aspirin EC 81 MG tablet Take 81 mg by mouth daily.    Historical Provider, MD  cyclobenzaprine (FLEXERIL) 10 MG tablet Take 1 tablet (10 mg total) by mouth 3 (three) times daily as needed for muscle spasms. 01/03/16   Jola Schmidt, MD  ibuprofen (ADVIL,MOTRIN) 600 MG tablet Take 1 tablet (600 mg total) by mouth every 8 (eight) hours as needed. 01/03/16   Jola Schmidt, MD  predniSONE (DELTASONE) 20 MG tablet Take 2 tablets (40 mg total) by mouth daily. 01/03/16   Jola Schmidt, MD    traMADol (ULTRAM) 50 MG tablet Take 1 tablet (50 mg total) by mouth every 6 (six) hours as needed. Patient not taking: Reported on 01/03/2016 12/02/15   Orpah Greek, MD    Family History Family History  Problem Relation Age of Onset  . Heart disease Maternal Grandfather   . Heart disease Paternal Uncle   . Prostate cancer Paternal Uncle     Social History Social History  Substance Use Topics  . Smoking status: Former Smoker    Packs/day: 1.00    Years: 20.00    Types: Cigarettes    Quit date: 01/13/2011  . Smokeless tobacco: Never Used  . Alcohol use No     Allergies   Percocet [oxycodone-acetaminophen] and Vicodin [hydrocodone-acetaminophen]   Review of Systems Review of Systems  Constitutional: Negative for chills and fever.  HENT: Positive for dental problem, ear pain and facial swelling. Negative for drooling, sore throat, trouble swallowing and voice change.      Physical Exam Updated Vital Signs BP 135/98   Pulse 75   Temp 98.6 F (37 C) (Oral)   Resp 18   Ht 5\' 7"  (1.702 m)   Wt 102.1 kg   SpO2 95%   BMI 35.24 kg/m   Physical Exam  Constitutional: He is oriented to person, place, and time. He appears well-developed and well-nourished. No distress.  HENT:  Head: Normocephalic and atraumatic.  Right Ear: Hearing, tympanic membrane, external ear and ear canal normal.  Left Ear: Hearing, tympanic membrane, external ear and ear canal normal.  Mouth/Throat: No trismus in the jaw. Abnormal dentition. Dental caries present. No dental abscesses. No posterior oropharyngeal erythema.  Moderate amount of swelling over the right jaw. Bottom lower molar appears decayed. No obvious signs of infection or abscess.  Eyes: Conjunctivae are normal. Pupils are equal, round, and reactive to light. Right eye exhibits no discharge. Left eye exhibits no discharge. No scleral icterus.  Neck: Normal range of motion.  Cardiovascular: Normal rate.   Pulmonary/Chest:  Effort normal. No respiratory distress.  Abdominal: He exhibits no distension.  Neurological: He is alert and oriented to person, place, and time.  Skin: Skin is warm and dry.  Psychiatric: He has a normal mood and affect. His behavior is normal.  Nursing note and vitals reviewed.    ED Treatments / Results  Labs (all labs ordered are listed, but only abnormal results are displayed) Labs Reviewed - No data to display  EKG  EKG Interpretation None       Radiology No results found.  Procedures Procedures (including critical care time)  Medications Ordered in ED Medications - No data to display   Initial Impression / Assessment and Plan / ED Course  I have reviewed the triage vital signs and the nursing notes.  Pertinent labs & imaging results that were available during my care of the patient were reviewed by me and considered in my medical decision making (see chart for details).  Clinical Course    45 year old male with dental pain due to dental caries. Patient is afebrile, non toxic appearing and swallowing secretions well. I gave patient referral to dentist and stressed the importance of dental follow up for ultimate management of dental pain. I have also discussed reasons to return immediately to the ER.  Patient expresses understanding and agrees with plan.  I will also give Augmentin and advised continue Naproxen and he can add Tylenol for extra pain control.    Final Clinical Impressions(s) / ED Diagnoses   Final diagnoses:  Pain, dental  Dental caries    New Prescriptions New Prescriptions   AMOXICILLIN-CLAVULANATE (AUGMENTIN) 875-125 MG TABLET    Take 1 tablet by mouth every 12 (twelve) hours.     Recardo Evangelist, PA-C 01/08/16 1224    Duffy Bruce, MD 01/09/16 [phone]

## 2016-01-08 NOTE — ED Triage Notes (Signed)
Pt presents to [facility]-ED today for complaints of right ear pain and right tooth/jaw swelling that started 5 days ago and has worsened since. He did notice a sore throat as well. No fever. Tried ibuprofen at home, gargling with salt water and aleve since earlier in the week. Has also tried ambisol for tooth pain.

## 2016-01-08 NOTE — ED Notes (Signed)
Patient is alert and oriented x3.  He was given DC instructions and follow up visit instructions.  Patient gave verbal understanding.  He was DC ambulatory under his own power to home.  V/S stable.  He was not showing any signs of distress on DC 

## 2016-01-17 ENCOUNTER — Emergency Department (HOSPITAL_COMMUNITY)
Admission: EM | Admit: 2016-01-17 | Discharge: 2016-01-17 | Disposition: A | Discharge status: Home / Self Care | Payer: Self-pay | Attending: Physician Assistant | Admitting: Physician Assistant

## 2016-01-17 ENCOUNTER — Encounter (HOSPITAL_COMMUNITY): Payer: Self-pay | Admitting: *Deleted

## 2016-01-17 DIAGNOSIS — Z79899 Other long term (current) drug therapy: Secondary | ICD-10-CM | POA: Insufficient documentation

## 2016-01-17 DIAGNOSIS — K0889 Other specified disorders of teeth and supporting structures: Secondary | ICD-10-CM | POA: Insufficient documentation

## 2016-01-17 DIAGNOSIS — J45909 Unspecified asthma, uncomplicated: Secondary | ICD-10-CM | POA: Insufficient documentation

## 2016-01-17 DIAGNOSIS — Z87891 Personal history of nicotine dependence: Secondary | ICD-10-CM | POA: Insufficient documentation

## 2016-01-17 MED ORDER — CLINDAMYCIN HCL 300 MG PO CAPS: 300.0000 mg | ORAL_CAPSULE | Freq: Four times a day (QID) | ORAL | 0 refills | Status: DC

## 2016-01-17 MED ORDER — DICLOFENAC SODIUM 75 MG PO TBEC: 75.0000 mg | DELAYED_RELEASE_TABLET | Freq: Two times a day (BID) | ORAL | 0 refills | Status: DC

## 2016-01-17 NOTE — ED Notes (Signed)
Declined W/C at D/C and was escorted to lobby by RN. 

## 2016-01-17 NOTE — ED Provider Notes (Signed)
[facility] DEPT Provider Note   CSN: [id number] Arrival date & time: 01/17/16  1543     History   Chief Complaint Chief Complaint  Patient presents with  . Dental Pain    HPI Nathan [name] is a 45 y.o. male.  The history is provided by the patient. No language interpreter was used.  Dental Pain   This is a new problem. The problem occurs constantly. The problem has been gradually worsening. The pain is moderate. He has tried nothing for the symptoms. The treatment provided no relief.  Pt reports tooth is continuing to hurt.    Past Medical History:  Diagnosis Date  . Asthma    'I think I might have asthma"  . Chronic back pain   . Dyspnea    chronic  . Sarcoidosis [facility])     Patient Active Problem List   Diagnosis Date Noted  . Other chest pain 08/22/2014  . Morbid obesity ([place]) 07/24/2014  . Upper airway cough syndrome 07/23/2014  . Sarcoidosis ([place]) 03/06/2011    History reviewed. No pertinent surgical history.     Home Medications    Prior to Admission medications   Medication Sig Start Date End Date Taking? Authorizing Provider  amoxicillin-clavulanate (AUGMENTIN) 875-125 MG tablet Take 1 tablet by mouth every 12 (twelve) hours. 01/08/16   Recardo Evangelist, PA-C  aspirin EC 81 MG tablet Take 81 mg by mouth daily.    Historical Provider, MD  clindamycin (CLEOCIN) 300 MG capsule Take 1 capsule (300 mg total) by mouth 4 (four) times daily. 01/17/16   Fransico Meadow, PA-C  cyclobenzaprine (FLEXERIL) 10 MG tablet Take 1 tablet (10 mg total) by mouth 3 (three) times daily as needed for muscle spasms. 01/03/16   Jola Schmidt, MD  diclofenac (VOLTAREN) 75 MG EC tablet Take 1 tablet (75 mg total) by mouth 2 (two) times daily. 01/17/16   Fransico Meadow, PA-C  ibuprofen (ADVIL,MOTRIN) 600 MG tablet Take 1 tablet (600 mg total) by mouth every 8 (eight) hours as needed. 01/03/16   Jola Schmidt, MD  predniSONE (DELTASONE) 20 MG tablet Take 2 tablets (40 mg total)  by mouth daily. 01/03/16   Jola Schmidt, MD  traMADol (ULTRAM) 50 MG tablet Take 1 tablet (50 mg total) by mouth every 6 (six) hours as needed. Patient not taking: Reported on 01/03/2016 12/02/15   Orpah Greek, MD    Family History Family History  Problem Relation Age of Onset  . Heart disease Maternal Grandfather   . Heart disease Paternal Uncle   . Prostate cancer Paternal Uncle     Social History Social History  Substance Use Topics  . Smoking status: Former Smoker    Packs/day: 1.00    Years: 20.00    Types: Cigarettes    Quit date: 01/13/2011  . Smokeless tobacco: Never Used  . Alcohol use No     Allergies   Percocet [oxycodone-acetaminophen] and Vicodin [hydrocodone-acetaminophen]   Review of Systems Review of Systems  All other systems reviewed and are negative.    Physical Exam Updated Vital Signs BP 130/92 (BP Location: Right Arm)   Pulse 75   Temp 98.4 F (36.9 C) (Oral)   Resp 17   Ht 5\' 7"  (1.702 m)   Wt 102.1 kg   SpO2 100%   BMI 35.24 kg/m   Physical Exam  Constitutional: He appears well-developed and well-nourished.  HENT:  Head: Normocephalic.  Right Ear: External ear normal.  Left Ear: External  ear normal.  Mouth/Throat: Oropharynx is clear and moist.  Swollen gum line  Eyes: EOM are normal. Pupils are equal, round, and reactive to light.  Neck: Normal range of motion.  Cardiovascular: Normal rate.   Pulmonary/Chest: Effort normal.  Abdominal: Soft.  Musculoskeletal: Normal range of motion.  Neurological: He is alert.  Skin: Skin is warm.  Psychiatric: He has a normal mood and affect.  Nursing note and vitals reviewed.    ED Treatments / Results  Labs (all labs ordered are listed, but only abnormal results are displayed) Labs Reviewed - No data to display  EKG  EKG Interpretation None       Radiology No results found.  Procedures Procedures (including critical care time)  Medications Ordered in  ED Medications - No data to display   Initial Impression / Assessment and Plan / ED Course  I have reviewed the triage vital signs and the nursing notes.  Pertinent labs & imaging results that were available during my care of the patient were reviewed by me and considered in my medical decision making (see chart for details).  Clinical Course     An After Visit Summary was printed and given to the patient.  Final Clinical Impressions(s) / ED Diagnoses   Final diagnoses:  Pain, dental    New Prescriptions New Prescriptions   CLINDAMYCIN (CLEOCIN) 300 MG CAPSULE    Take 1 capsule (300 mg total) by mouth 4 (four) times daily.   DICLOFENAC (VOLTAREN) 75 MG EC TABLET    Take 1 tablet (75 mg total) by mouth 2 (two) times daily.     Hollace Kinnier Cheraw, PA-C 01/17/16 [place], MD 01/17/16 2100

## 2016-01-17 NOTE — ED Triage Notes (Signed)
Pt states was tx with antibiotics for dental infection last week.  Finished antibiotics this weekend, but the pain continues.

## 2016-05-30 ENCOUNTER — Encounter (HOSPITAL_COMMUNITY): Payer: Self-pay

## 2016-05-30 ENCOUNTER — Emergency Department (HOSPITAL_COMMUNITY)
Admission: EM | Admit: 2016-05-30 | Discharge: 2016-05-30 | Disposition: A | Discharge status: Home / Self Care | Payer: Self-pay | Attending: Emergency Medicine | Admitting: Emergency Medicine

## 2016-05-30 DIAGNOSIS — M62838 Other muscle spasm: Secondary | ICD-10-CM | POA: Insufficient documentation

## 2016-05-30 DIAGNOSIS — M545 Low back pain, unspecified: Secondary | ICD-10-CM

## 2016-05-30 DIAGNOSIS — Z87891 Personal history of nicotine dependence: Secondary | ICD-10-CM | POA: Insufficient documentation

## 2016-05-30 DIAGNOSIS — Z79899 Other long term (current) drug therapy: Secondary | ICD-10-CM | POA: Insufficient documentation

## 2016-05-30 DIAGNOSIS — J45909 Unspecified asthma, uncomplicated: Secondary | ICD-10-CM | POA: Insufficient documentation

## 2016-05-30 DIAGNOSIS — Z7982 Long term (current) use of aspirin: Secondary | ICD-10-CM | POA: Insufficient documentation

## 2016-05-30 MED ORDER — IBUPROFEN 800 MG PO TABS: 800.0000 mg | ORAL_TABLET | Freq: Three times a day (TID) | ORAL | 0 refills | Status: DC

## 2016-05-30 MED ORDER — TRAMADOL HCL 50 MG PO TABS: 50.0000 mg | ORAL_TABLET | Freq: Four times a day (QID) | ORAL | 0 refills | Status: DC | PRN

## 2016-05-30 MED ORDER — METHOCARBAMOL 500 MG PO TABS: 500.0000 mg | ORAL_TABLET | Freq: Two times a day (BID) | ORAL | 0 refills | Status: DC

## 2016-05-30 NOTE — Discharge Instructions (Signed)
Return to the ED with any concerns including weakness of legs or arms, not able to urinate, loss of control of bowel or bladder, fever/chills, decreased level of alertness/lethargy or any other alarming symptoms

## 2016-05-30 NOTE — ED Notes (Signed)
Pt is in stable condition upon d/c and ambulates from ED. 

## 2016-05-30 NOTE — ED Triage Notes (Signed)
Pt c/o pain at upper neck x 2 weeks with pain into left arm today with intermittent chest tightness and low back pain.

## 2016-05-30 NOTE — ED Provider Notes (Signed)
[facility] DEPT Provider Note   CSN: [id number] Arrival date & time: 05/30/16  0841     History   Chief Complaint Chief Complaint  Patient presents with  . Arm Pain  . Chest Pain  . Back Pain  . Shoulder Pain    HPI Nathan [name] is a 46 y.o. male.  HPI  Pt states that for 2 weeks he feels he has had a pinched nerve in his left upper back/side of his neck.  Pain is now going down into his left shoulder.  Pain is worse with movement of his head and arm.  He asked the pharmacist what to take and he tried taking naproxen which did not help. He has also tried ice and heating pad.  Also this morning he was tying his shoes and was bending over- when he straightened his back he felt a pain in his lower back and now it feels stiff as well.  No weakness in legs, no retention of urine, no incontinence of bowel or bladder, no fever.  No significant trauma.  He was on his way to work and is wondering what he should do to help with his symptoms and when he can go back to work.  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  There are no other associated systemic symptoms, there are no other alleviating or modifying factors.   Past Medical History:  Diagnosis Date  . Asthma    'I think I might have asthma"  . Chronic back pain   . Dyspnea    chronic  . Sarcoidosis     Patient Active Problem List   Diagnosis Date Noted  . Other chest pain 08/22/2014  . Morbid obesity ([place]) 07/24/2014  . Upper airway cough syndrome 07/23/2014  . Sarcoidosis 03/06/2011    History reviewed. No pertinent surgical history.     Home Medications    Prior to Admission medications   Medication Sig Start Date End Date Taking? Authorizing Provider  aspirin EC 81 MG tablet Take 81 mg by mouth daily.   Yes Historical Provider, MD  metFORMIN (GLUCOPHAGE) 500 MG tablet Take 500 mg by mouth daily. 04/03/16  Yes Historical Provider, MD  predniSONE (DELTASONE) 20 MG  tablet Take 2 tablets (40 mg total) by mouth daily. Patient taking differently: Take 20 mg by mouth daily.  01/03/16  Yes Jola Schmidt, MD  amoxicillin-clavulanate (AUGMENTIN) 875-125 MG tablet Take 1 tablet by mouth every 12 (twelve) hours. Patient not taking: Reported on 05/30/2016 01/08/16   Recardo Evangelist, PA-C  clindamycin (CLEOCIN) 300 MG capsule Take 1 capsule (300 mg total) by mouth 4 (four) times daily. Patient not taking: Reported on 05/30/2016 01/17/16   Fransico Meadow, PA-C  diclofenac (VOLTAREN) 75 MG EC tablet Take 1 tablet (75 mg total) by mouth 2 (two) times daily. Patient not taking: Reported on 05/30/2016 01/17/16   Fransico Meadow, PA-C  ibuprofen (ADVIL,MOTRIN) 800 MG tablet Take 1 tablet (800 mg total) by mouth 3 (three) times daily. 05/30/16   Alfonzo Beers, MD  methocarbamol (ROBAXIN) 500 MG tablet Take 1 tablet (500 mg total) by mouth 2 (two) times daily. 05/30/16   Alfonzo Beers, MD  traMADol (ULTRAM) 50 MG tablet Take 1 tablet (50 mg total) by mouth every 6 (six) hours as needed. 05/30/16   Alfonzo Beers, MD    Family History Family History  Problem Relation Age of Onset  . Heart disease Maternal Grandfather   .  Heart disease Paternal Uncle   . Prostate cancer Paternal Uncle     Social History Social History  Substance Use Topics  . Smoking status: Former Smoker    Packs/day: 1.00    Years: 20.00    Types: Cigarettes    Quit date: 01/13/2011  . Smokeless tobacco: Never Used  . Alcohol use No     Allergies   Percocet [oxycodone-acetaminophen] and Vicodin [hydrocodone-acetaminophen]   Review of Systems Review of Systems  ROS reviewed and all otherwise negative except for mentioned in HPI   Physical Exam Updated Vital Signs BP (!) 126/92   Pulse 69   Temp 98.1 F (36.7 C) (Oral)   Resp 16   Ht 5\' 8"  (1.727 m)   Wt 102.1 kg   SpO2 96%   BMI 34.21 kg/m  Vitals reviewed Physical Exam Physical Examination: General appearance - alert, well  appearing, and in no distress Mental status - alert, oriented to person, place, and time Eyes - no conjunctival injection, no scleral icterus Neck - no midline tenderness, left ttp over trapezius muscle Chest - clear to auscultation, no wheezes, rales or rhonchi, symmetric air entry Heart - normal rate, regular rhythm, normal S1, S2, no murmurs, rubs, clicks or gallops Back exam - no midline tenderness to palpation, mild bilateral paraspinal tenderness to palpation no CVA tenderness Neurological - alert, oriented, normal speech, strength 5/5 in extremities x 4, sensation intact Musculoskeletal - no joint tenderness, deformity or swelling Extremities - peripheral pulses normal, no pedal edema, no clubbing or cyanosis Skin - normal coloration and turgor, no rashes  ED Treatments / Results  Labs (all labs ordered are listed, but only abnormal results are displayed) Labs Reviewed - No data to display  EKG  EKG Interpretation  Date/Time:  Tuesday May 30 2016 09:04:50 EDT Ventricular Rate:  75 PR Interval:  198 QRS Duration: 98 QT Interval:  380 QTC Calculation: 424 R Axis:   12 Text Interpretation:  Normal sinus rhythm Normal ECG No significant change since last tracing Confirmed by Canary Brim  MD, Yuriy Cui [phone]) on 05/30/2016 9:30:53 AM       Radiology No results found.  Procedures Procedures (including critical care time)  Medications Ordered in ED Medications - No data to display   Initial Impression / Assessment and Plan / ED Course  I have reviewed the triage vital signs and the nursing notes.  Pertinent labs & imaging results that were available during my care of the patient were reviewed by me and considered in my medical decision making (see chart for details).     Pt presenting with c/o left sided neck/upper back pain, also bilateral low back pain.  No trauma.  ttp over left upper trapezius distribution.  No midline spinal tenderness.  No signs or symptoms of cauda  equina.  No fever to suggest epidural abscess.  Suspect muscle spasm.  Will treat with anti-inflammatories, muscle relaxants.  Discharged with strict return precautions.  Pt agreeable with plan.  Final Clinical Impressions(s) / ED Diagnoses   Final diagnoses:  Trapezius muscle spasm  Acute bilateral low back pain without sciatica    New Prescriptions Discharge Medication List as of 05/30/2016  9:49 AM    START taking these medications   Details  methocarbamol (ROBAXIN) 500 MG tablet Take 1 tablet (500 mg total) by mouth 2 (two) times daily., Starting Tue 05/30/2016, Print         Alfonzo Beers, MD 05/30/16 [phone]

## 2016-06-14 DIAGNOSIS — M5090 Cervical disc disorder, unspecified, unspecified cervical region: Secondary | ICD-10-CM | POA: Insufficient documentation

## 2016-06-28 ENCOUNTER — Emergency Department (HOSPITAL_COMMUNITY): Payer: Self-pay

## 2016-06-28 ENCOUNTER — Encounter (HOSPITAL_COMMUNITY): Payer: Self-pay | Admitting: *Deleted

## 2016-06-28 ENCOUNTER — Emergency Department (HOSPITAL_COMMUNITY)
Admission: EM | Admit: 2016-06-28 | Discharge: 2016-06-28 | Disposition: A | Discharge status: Home / Self Care | Payer: Self-pay | Attending: Emergency Medicine | Admitting: Emergency Medicine

## 2016-06-28 DIAGNOSIS — R0789 Other chest pain: Secondary | ICD-10-CM | POA: Insufficient documentation

## 2016-06-28 DIAGNOSIS — Z87891 Personal history of nicotine dependence: Secondary | ICD-10-CM | POA: Insufficient documentation

## 2016-06-28 DIAGNOSIS — J45909 Unspecified asthma, uncomplicated: Secondary | ICD-10-CM | POA: Insufficient documentation

## 2016-06-28 DIAGNOSIS — Z7982 Long term (current) use of aspirin: Secondary | ICD-10-CM | POA: Insufficient documentation

## 2016-06-28 MED ORDER — PREDNISONE 20 MG PO TABS: 20.0000 mg | ORAL_TABLET | Freq: Every day | ORAL | 0 refills | Status: DC

## 2016-06-28 MED ORDER — ALBUTEROL SULFATE HFA 108 (90 BASE) MCG/ACT IN AERS
1.0000 | INHALATION_SPRAY | RESPIRATORY_TRACT | Status: DC | PRN
  Administered 2016-06-28: 2 via RESPIRATORY_TRACT
  Filled 2016-06-28: qty 6.7

## 2016-06-28 MED ORDER — CYCLOBENZAPRINE HCL 10 MG PO TABS: 10.0000 mg | ORAL_TABLET | Freq: Two times a day (BID) | ORAL | 0 refills | Status: DC | PRN

## 2016-06-28 MED ORDER — AEROCHAMBER PLUS FLO-VU MEDIUM MISC
1.0000 | Freq: Once | Status: AC
  Administered 2016-06-28: 1
  Filled 2016-06-28 (×3): qty 1

## 2016-06-28 MED ORDER — KETOROLAC TROMETHAMINE 30 MG/ML IJ SOLN
30.0000 mg | Freq: Once | INTRAMUSCULAR | Status: DC
  Filled 2016-06-28: qty 1

## 2016-06-28 MED ORDER — IBUPROFEN 400 MG PO TABS
600.0000 mg | ORAL_TABLET | Freq: Once | ORAL | Status: AC
  Administered 2016-06-28: 600 mg via ORAL
  Filled 2016-06-28: qty 1

## 2016-06-28 MED ORDER — IBUPROFEN 600 MG PO TABS: 600.0000 mg | ORAL_TABLET | Freq: Four times a day (QID) | ORAL | 0 refills | Status: DC | PRN

## 2016-06-28 NOTE — ED Provider Notes (Signed)
[facility] DEPT Provider Note   CSN: [id number] Arrival date & time: 06/28/16  1352  By signing my name below, I, Ethelle Lyon Long, attest that this documentation has been prepared under the direction and in the presence of Isla Pence, MD. Electronically Signed: Ethelle Lyon Long, Scribe. 06/28/2016. 3:14 PM.  History   Chief Complaint Chief Complaint  Patient presents with  . rib pain   The history is provided by the patient and medical records. No language interpreter was used.    HPI Comments:  Nathan [name] is a 46 y.o. male with a PMHx of Asthma, Chronic Back Pain, Sarcoidosis, and Dyspnea, who presents to the Emergency Department complaining of constant, sharp, gradually worsening left-sided rib pain onset six days ago. Pt reports this left sided pain arising six days PTA. He called paramedics about the issue at that time who believed the pain to be related to a panic attack. Two days ago, the pain worsened with associated SOB. Pt reports he has a h/o pinched nerve in his neck. No recent injuries or falls stated. Pt has an additional associated symptom of a mild cough. He has been taking his daily prednisone consistently. He tried a heating pad at home with temporary relief. He also has tried an albuterol inhaler at home without any relief. Pt denies any other complaints at this time.   Past Medical History:  Diagnosis Date  . Asthma    'I think I might have asthma"  . Chronic back pain   . Dyspnea    chronic  . Sarcoidosis    Patient Active Problem List   Diagnosis Date Noted  . Other chest pain 08/22/2014  . Morbid obesity ([place]) 07/24/2014  . Upper airway cough syndrome 07/23/2014  . Sarcoidosis 03/06/2011   History reviewed. No pertinent surgical history.  Home Medications    Prior to Admission medications   Medication Sig Start Date End Date Taking? Authorizing Provider  amoxicillin-clavulanate (AUGMENTIN) 875-125 MG tablet Take 1 tablet by mouth every  12 (twelve) hours. Patient not taking: Reported on 05/30/2016 01/08/16   Recardo Evangelist, PA-C  aspirin EC 81 MG tablet Take 81 mg by mouth daily.    [provider]  clindamycin (CLEOCIN) 300 MG capsule Take 1 capsule (300 mg total) by mouth 4 (four) times daily. Patient not taking: Reported on 05/30/2016 01/17/16   Fransico Meadow, PA-C  cyclobenzaprine (FLEXERIL) 10 MG tablet Take 1 tablet (10 mg total) by mouth 2 (two) times daily as needed for muscle spasms. 06/28/16   Isla Pence, MD  diclofenac (VOLTAREN) 75 MG EC tablet Take 1 tablet (75 mg total) by mouth 2 (two) times daily. Patient not taking: Reported on 05/30/2016 01/17/16   Fransico Meadow, PA-C  ibuprofen (ADVIL,MOTRIN) 600 MG tablet Take 1 tablet (600 mg total) by mouth every 6 (six) hours as needed. 06/28/16   Isla Pence, MD  metFORMIN (GLUCOPHAGE) 500 MG tablet Take 500 mg by mouth daily. 04/03/16   [provider]  methocarbamol (ROBAXIN) 500 MG tablet Take 1 tablet (500 mg total) by mouth 2 (two) times daily. 05/30/16   Alfonzo Beers, MD  predniSONE (DELTASONE) 20 MG tablet Take 1 tablet (20 mg total) by mouth daily. 06/28/16   Isla Pence, MD  traMADol (ULTRAM) 50 MG tablet Take 1 tablet (50 mg total) by mouth every 6 (six) hours as needed. 05/30/16   Alfonzo Beers, MD    Family History Family History  Problem Relation Age of Onset  .  Heart disease Maternal Grandfather   . Heart disease Paternal Uncle   . Prostate cancer Paternal Uncle     Social History Social History  Substance Use Topics  . Smoking status: Former Smoker    Packs/day: 1.00    Years: 20.00    Types: Cigarettes    Quit date: 01/13/2011  . Smokeless tobacco: Never Used  . Alcohol use No     Allergies   Percocet [oxycodone-acetaminophen] and Vicodin [hydrocodone-acetaminophen]   Review of Systems Review of Systems All systems reviewed and are negative for acute change except as noted in the HPI.    Physical  Exam Updated Vital Signs BP 116/90 (BP Location: Right Arm)   Pulse 83   Temp 98.7 F (37.1 C) (Oral)   Resp 14   Ht 5\' 7"  (1.702 m)   Wt 225 lb (102.1 kg)   SpO2 99%   BMI 35.24 kg/m   Physical Exam  Constitutional: He is oriented to person, place, and time. He appears well-developed and well-nourished.  HENT:  Head: Normocephalic.  Eyes: Conjunctivae are normal.  Cardiovascular: Normal rate.   Pulmonary/Chest: Effort normal.  Abdominal: He exhibits no distension.  Musculoskeletal: Normal range of motion.  Neurological: He is alert and oriented to person, place, and time.  Skin: Skin is warm and dry.  Psychiatric: He has a normal mood and affect.  Nursing note and vitals reviewed.    ED Treatments / Results  DIAGNOSTIC STUDIES:  Oxygen Saturation is 99% on RA, normal by my interpretation.    COORDINATION OF CARE:  3:15 PM Discussed treatment plan with pt at bedside including CXR, pain medication, inhaler with spacer and pt agreed to plan.  Labs (all labs ordered are listed, but only abnormal results are displayed) Labs Reviewed - No data to display  EKG  EKG Interpretation None       Radiology Dg Chest 2 View  Result Date: 06/28/2016 CLINICAL DATA:  Left-sided chest pain for several days, no known injury, initial encounter EXAM: CHEST  2 VIEW COMPARISON:  01/03/2016 FINDINGS: Cardiac shadow is within normal limits. Hilar fullness is again noted and stable. Stable scarring in the left apex is seen. No focal infiltrate or sizable effusion is noted. No bony abnormality is noted. IMPRESSION: Chronic changes without acute Electronically Signed   By: Inez Catalina M.D.   On: 06/28/2016 16:07    Procedures Procedures (including critical care time)  Medications Ordered in ED Medications  albuterol (PROVENTIL HFA;VENTOLIN HFA) 108 (90 Base) MCG/ACT inhaler 1-2 puff (not administered)  AEROCHAMBER PLUS FLO-VU MEDIUM MISC 1 each (not administered)  ibuprofen  (ADVIL,MOTRIN) tablet 600 mg (600 mg Oral Given 06/28/16 1532)     Initial Impression / Assessment and Plan / ED Course  I have reviewed the triage vital signs and the nursing notes.  Pertinent labs & imaging results that were available during my care of the patient were reviewed by me and considered in my medical decision making (see chart for details).    Pt is feeling better.  He will be given an inhaler plus spacer prior to d/c.  He also requested prednisone which he said he's been taking daily.  I gave him a short course of this, but he needs to f/u with pcp to determine if this is necessary.  He knows to return if worse.  Final Clinical Impressions(s) / ED Diagnoses   Final diagnoses:  Left-sided chest wall pain    New Prescriptions New Prescriptions   CYCLOBENZAPRINE (  FLEXERIL) 10 MG TABLET    Take 1 tablet (10 mg total) by mouth 2 (two) times daily as needed for muscle spasms.   IBUPROFEN (ADVIL,MOTRIN) 600 MG TABLET    Take 1 tablet (600 mg total) by mouth every 6 (six) hours as needed.    I personally performed the services described in this documentation, which was scribed in my presence. The recorded information has been reviewed and is accurate.     Isla Pence, MD 06/28/16 [phone]

## 2016-06-28 NOTE — ED Notes (Signed)
Contacted pharmacy for missing aerochamber.

## 2016-06-28 NOTE — ED Triage Notes (Signed)
Pt reports L sided rib pain onset x 6 days, denies injury to the area, pt denies current SOB, denies CP, hx of Sarcoidosis, no respiratory distress noted, A&O x4

## 2016-08-13 ENCOUNTER — Emergency Department (HOSPITAL_COMMUNITY): Payer: Self-pay

## 2016-08-13 ENCOUNTER — Encounter (HOSPITAL_COMMUNITY): Payer: Self-pay | Admitting: Emergency Medicine

## 2016-08-13 ENCOUNTER — Other Ambulatory Visit: Payer: Self-pay

## 2016-08-13 ENCOUNTER — Emergency Department (HOSPITAL_COMMUNITY)
Admission: EM | Admit: 2016-08-13 | Discharge: 2016-08-13 | Disposition: A | Discharge status: Home / Self Care | Payer: Self-pay | Attending: Emergency Medicine | Admitting: Emergency Medicine

## 2016-08-13 DIAGNOSIS — Z7982 Long term (current) use of aspirin: Secondary | ICD-10-CM | POA: Insufficient documentation

## 2016-08-13 DIAGNOSIS — D869 Sarcoidosis, unspecified: Secondary | ICD-10-CM

## 2016-08-13 DIAGNOSIS — M549 Dorsalgia, unspecified: Secondary | ICD-10-CM | POA: Insufficient documentation

## 2016-08-13 DIAGNOSIS — J45909 Unspecified asthma, uncomplicated: Secondary | ICD-10-CM | POA: Insufficient documentation

## 2016-08-13 DIAGNOSIS — Z87891 Personal history of nicotine dependence: Secondary | ICD-10-CM | POA: Insufficient documentation

## 2016-08-13 DIAGNOSIS — G8929 Other chronic pain: Secondary | ICD-10-CM | POA: Insufficient documentation

## 2016-08-13 MED ORDER — METHOCARBAMOL 500 MG PO TABS: 500.0000 mg | ORAL_TABLET | Freq: Two times a day (BID) | ORAL | 0 refills | Status: DC

## 2016-08-13 NOTE — Discharge Instructions (Signed)
Continue to take ibuprofen for your pain. You may continue to use your muscle relaxants for symptom control. Follow-up with your primary care doctor to help you establish care with pulmonology. This is important, as sarcoidosis is a long-term condition that ideally would be managed by the same person over time. Return to the emergency department if you develop fever, chills, persistent chest pain, or persistent shortness of breath.

## 2016-08-13 NOTE — ED Provider Notes (Signed)
[place] [facility] DEPT Provider Note   CSN: [id number] Arrival date & time: 08/13/16  1146  By signing my name below, I, Nathan [name], attest that this documentation has been prepared under the direction and in the presence of Nathan Riling PA-C.  Electronically Signed: Ephriam [name], ED Scribe. 08/13/16. 1:59 PM.   History   Chief Complaint Chief Complaint  Patient presents with  . Cough    HPI HPI Comments: Nathan [name] is a 46 y.o. male with Hx of Sarcoidosis, who presents to the Emergency Department complaining of acute on chronic cough with associated chest pain that was exacerbated this morning. Today, pt arrived at work and started having a productive cough with thick mucous. Pt states when he coughs hard, he has sharp pain in his chest which he has had intermittently in the past with previous coughs. He is not currently experiencing any chest pain or SOB. Currently, he states he only feels his chronic pain in his Upper L back, and it is no worse than usual.  Pt is closely followed by PCP, and at his last visit on Thursday was dx with a cold and told to stay well hydrated. Pt was not given a refill of his prednisone for his sarcoidosis, as PCP is worried about long term side effects. Pt is trying to establish care with Pulmonology for further management. No fever or chills. Denies sore throat, difficulty swallowing, nasal congestion, ear pain, or abd pain.  The history is provided by the patient. No language interpreter was used.    Past Medical History:  Diagnosis Date  . Asthma    'I think I might have asthma"  . Chronic back pain   . Dyspnea    chronic  . Sarcoidosis     Patient Active Problem List   Diagnosis Date Noted  . Other chest pain 08/22/2014  . Morbid obesity ([place]) 07/24/2014  . Upper airway cough syndrome 07/23/2014  . Sarcoidosis 03/06/2011    History reviewed. No pertinent surgical history.    Home Medications    Prior to Admission medications    Medication Sig Start Date End Date Taking? Authorizing Provider  amoxicillin-clavulanate (AUGMENTIN) 875-125 MG tablet Take 1 tablet by mouth every 12 (twelve) hours. Patient not taking: Reported on 05/30/2016 01/08/16   Nathan Evangelist, PA-C  aspirin EC 81 MG tablet Take 81 mg by mouth daily.    [provider]  clindamycin (CLEOCIN) 300 MG capsule Take 1 capsule (300 mg total) by mouth 4 (four) times daily. Patient not taking: Reported on 05/30/2016 01/17/16   Nathan Meadow, PA-C  cyclobenzaprine (FLEXERIL) 10 MG tablet Take 1 tablet (10 mg total) by mouth 2 (two) times daily as needed for muscle spasms. 06/28/16   Nathan Pence, MD  diclofenac (VOLTAREN) 75 MG EC tablet Take 1 tablet (75 mg total) by mouth 2 (two) times daily. Patient not taking: Reported on 05/30/2016 01/17/16   Nathan Meadow, PA-C  ibuprofen (ADVIL,MOTRIN) 600 MG tablet Take 1 tablet (600 mg total) by mouth every 6 (six) hours as needed. 06/28/16   Nathan Pence, MD  metFORMIN (GLUCOPHAGE) 500 MG tablet Take 500 mg by mouth daily. 04/03/16   [provider]  methocarbamol (ROBAXIN) 500 MG tablet Take 1 tablet (500 mg total) by mouth 2 (two) times daily. 08/13/16   Nathan Josten, PA-C  predniSONE (DELTASONE) 20 MG tablet Take 1 tablet (20 mg total) by mouth daily. 06/28/16   Nathan Pence, MD  traMADol (ULTRAM) 50 MG  tablet Take 1 tablet (50 mg total) by mouth every 6 (six) hours as needed. 05/30/16   Nathan Beers, MD    Family History Family History  Problem Relation Age of Onset  . Heart disease Maternal Grandfather   . Heart disease Paternal Uncle   . Prostate cancer Paternal Uncle     Social History Social History  Substance Use Topics  . Smoking status: Former Smoker    Packs/day: 1.00    Years: 20.00    Types: Cigarettes    Quit date: 01/13/2011  . Smokeless tobacco: Never Used  . Alcohol use No     Allergies   Percocet [oxycodone-acetaminophen] and Vicodin  [hydrocodone-acetaminophen]   Review of Systems Review of Systems  Constitutional: Negative for chills and fever.  Respiratory: Negative for cough, chest tightness and shortness of breath.   Musculoskeletal: Positive for back pain.     Physical Exam Updated Vital Signs BP 113/84 (BP Location: Right Arm)   Pulse 70   Temp 98.2 F (36.8 C) (Oral)   Resp 18   SpO2 96%   Physical Exam  Constitutional: He is oriented to person, place, and time. He appears well-developed and well-nourished. No distress.  HENT:  Head: Normocephalic and atraumatic.  Right Ear: Tympanic membrane normal.  Left Ear: Tympanic membrane normal.  Nose: Nose normal.  Mouth/Throat: Uvula is midline and oropharynx is clear and moist. Mucous membranes are dry.  Eyes: Conjunctivae are normal. Pupils are equal, round, and reactive to light. Right eye exhibits no discharge. Left eye exhibits no discharge.  Neck: Normal range of motion. Neck supple.  Cardiovascular: Normal rate and regular rhythm.   Pulmonary/Chest: Effort normal and breath sounds normal. No respiratory distress. He has no wheezes.  Lymphadenopathy:    He has no cervical adenopathy.  Neurological: He is alert and oriented to person, place, and time.  Skin: Skin is warm and dry. He is not diaphoretic.  Psychiatric: He has a normal mood and affect. Judgment normal.  Nursing note and vitals reviewed.    ED Treatments / Results  DIAGNOSTIC STUDIES: Oxygen Saturation is 98% on RA, normal by my interpretation.  COORDINATION OF CARE: 1:59 PM-Discussed treatment plan with pt at bedside and pt agreed to plan.   Labs (all labs ordered are listed, but only abnormal results are displayed) Labs Reviewed - No data to display  EKG  EKG Interpretation  Date/Time:  Sunday August 13 2016 14:21:23 EDT Ventricular Rate:  68 PR Interval:    QRS Duration: 105 QT Interval:  410 QTC Calculation: 436 R Axis:   80 Text Interpretation:  Sinus rhythm No  significant change since last tracing Confirmed by Dorie Rank [phone]) on 08/13/2016 2:34:39 PM       Radiology Dg Chest 2 View  Result Date: 08/13/2016 CLINICAL DATA:  Shortness of breath.  History of sarcoidosis. EXAM: CHEST  2 VIEW COMPARISON:  06/28/2016 FINDINGS: Mild elevation of the right hemidiaphragm. Linear scarring in the left base, stable. Heart is normal size. No effusions or acute bony abnormality. IMPRESSION: No active cardiopulmonary disease. Electronically Signed   By: Rolm Baptise M.D.   On: 08/13/2016 12:42    Procedures Procedures (including critical care time)  Medications Ordered in ED Medications - No data to display   Initial Impression / Assessment and Plan / ED Course  I have reviewed the triage vital signs and the nursing notes.  Pertinent labs & imaging results that were available during my care of the patient were  reviewed by me and considered in my medical decision making (see chart for details).     Pt with sarcoidosis presenting with chronic upper back pain. This morning, he had coughing fit with sharp chest pain which resolved at the time. Currently, he denies COP or SOB. He denies fever and chills. Will get CXR to r/o PNA. EKG obtained.   CXR negative, and EKG shows NSR. At this time, I believe pt's symptoms are related to his sarcodiosis and cold. No evidence of cardiac etiology, O2 sats stable throughout visit. Pt has been seen here multiple times for this back pain. Pt to follow up with PCP about establishing care with pulmonology. Pt takes ibuprofen 600 prn for pain. Discussed with pt, and is to continue taking this, but he no longer has muscle relaxants. Will write rx for robaxin. Will not give prednisone at this time, as PCP has stopped the medicine. Discussed case with Dr. Tomi Bamberger, and he agrees with plan. Return precautions given. Pt states he understands and agrees to plan.   Final Clinical Impressions(s) / ED Diagnoses   Final diagnoses:  Other  chronic back pain  Sarcoidosis    New Prescriptions Discharge Medication List as of 08/13/2016  2:56 PM    I personally performed the services described in this documentation, which was scribed in my presence. The recorded information has been reviewed and is accurate.    Franchot Heidelberg, PA-C 08/14/16 1007    Dorie Rank, MD 08/14/16 [phone]

## 2016-08-13 NOTE — ED Triage Notes (Signed)
Patient reports that he has sarcoidosis and today when on way to work after drinking water started coughing and has thick mucous. patient reports that PCP he sees is trying to get him in with a pulmonologist. Patient c/o back pain after coughing.

## 2016-09-18 ENCOUNTER — Ambulatory Visit: Payer: Self-pay | Admitting: Acute Care

## 2016-09-21 ENCOUNTER — Encounter: Payer: Self-pay | Admitting: Internal Medicine

## 2016-09-21 ENCOUNTER — Ambulatory Visit (INDEPENDENT_AMBULATORY_CARE_PROVIDER_SITE_OTHER)
Admission: RE | Admit: 2016-09-21 | Discharge: 2016-09-21 | Disposition: A | Discharge status: Home / Self Care | Payer: Self-pay | Source: Ambulatory Visit | Attending: Internal Medicine | Admitting: Internal Medicine

## 2016-09-21 ENCOUNTER — Ambulatory Visit (INDEPENDENT_AMBULATORY_CARE_PROVIDER_SITE_OTHER): Payer: Self-pay | Admitting: Internal Medicine

## 2016-09-21 VITALS — BP 130/80 | HR 79 | Ht 67.0 in | Wt 216.6 lb

## 2016-09-21 DIAGNOSIS — R058 Other specified cough: Secondary | ICD-10-CM

## 2016-09-21 DIAGNOSIS — R05 Cough: Secondary | ICD-10-CM

## 2016-09-21 DIAGNOSIS — D869 Sarcoidosis, unspecified: Secondary | ICD-10-CM

## 2016-09-21 DIAGNOSIS — R0789 Other chest pain: Secondary | ICD-10-CM
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
## 2016-09-21 NOTE — Assessment & Plan Note (Signed)
-   Steroid trial 03/06/2011  For clinical dx only > partiall improved  - new skin lesions tip of nostril on R  Since around summer/fall of 2017 > 09/21/2016 referred to derm for tissue dx  Should he prove to have sarcoid of skin would strongly rec a course of plaquenil over continue short course prednisone rx as he really doesn't have convincing sarcoid changes on cxr   NB  A good rule of thumb is that >95% of pts with active sarcoid in any organ will have some plain cxr changes as he clearly does but all nodal, not parenchymal  - on the other hand  if there are active pulmonary symptoms the cxr will look much worse than the patient:  No evidence of second scenario here/ strongly doubt active pulmonary dz

## 2016-09-21 NOTE — Patient Instructions (Addendum)
Please remember to go to the  x-ray department downstairs for your tests - we will call you with the results when they are available.  Zostrix cream 4 x daily just apply right where you have pain  And as it improves ok to cut back to bedtime only dosing   Call me when you know you have insurance so I can arrange a  skin biopsy   We will request blood work from [facility]   Please schedule a follow up office visit in 4 weeks, sooner if needed  with all medications /inhalers/ solutions in hand so we can verify exactly what you are taking. This includes all medications from all doctors and over the counters

## 2016-09-21 NOTE — Assessment & Plan Note (Signed)
-   augmentin x 20 days complete 08/03/14 > sinus ct  08/10/14 >Visualized paranasal sinuses are essentially clear. Minimal layering fluid in the left sphenoid sinus.> refer ent  > notified 08/12/2014 would have to pay cash and wishes to delay for now  Suspect the cough is due to chronic rhinitis/ bronchitis and need to reconsider sinus eval when insurance comes through rather than continued empirical abx which have been only partially effective

## 2016-09-22 NOTE — Progress Notes (Signed)
Spoke with pt and notified of results per Dr. Wert. Pt verbalized understanding and denied any questions. 

## 2016-10-23 ENCOUNTER — Encounter: Payer: Self-pay | Admitting: Internal Medicine

## 2016-10-23 ENCOUNTER — Ambulatory Visit (INDEPENDENT_AMBULATORY_CARE_PROVIDER_SITE_OTHER): Payer: Self-pay | Admitting: Internal Medicine

## 2016-10-23 VITALS — BP 124/80 | HR 67 | Ht 67.0 in | Wt 215.2 lb

## 2016-10-23 DIAGNOSIS — R05 Cough: Secondary | ICD-10-CM

## 2016-10-23 DIAGNOSIS — G8929 Other chronic pain: Secondary | ICD-10-CM

## 2016-10-23 DIAGNOSIS — M25512 Pain in left shoulder: Secondary | ICD-10-CM

## 2016-10-23 DIAGNOSIS — D869 Sarcoidosis, unspecified: Secondary | ICD-10-CM

## 2016-10-23 DIAGNOSIS — R058 Other specified cough: Secondary | ICD-10-CM
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
## 2016-10-23 NOTE — Patient Instructions (Addendum)
Please see patient coordinator before you leave today  to schedule dermatolgy appt / orthopedic evaluation   For cough > mucinex dm 1200 mg every 12 hours as needed     Please schedule a follow up visit in 3 months but call sooner if needed  with all medications /inhalers/ solutions in hand so we can verify exactly what you are taking. This includes all medications from all doctors and over the counters
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
## 2016-10-24 NOTE — Assessment & Plan Note (Signed)
-   augmentin x 20 days complete 08/03/14 > sinus ct  08/10/14 >Visualized paranasal sinuses are essentially clear. Minimal layering fluid in the left sphenoid sinus.> refer ent  > notified 08/12/2014 would have to pay cash and wishes to delay until has insurance   Probably needs repeat sinus ct/ allergy profile but not insured yet and this complaint dates back > 6 years so ok to delay > rx mucinex dm in meantime

## 2016-10-24 NOTE — Assessment & Plan Note (Signed)
-  Steroid trial 03/06/2011  For clinical dx only > partially  improved  - ESR  08/10/16  = 14  - new skin lesions tip of nostril on R  Since around summer/fall of 2017 > 09/21/2016 referred to derm for tissue dx did not go> referred again 10/23/2016    No evidence of pulmonary sarcoid > ? Candidate for plaquenil but first need tissue dx > referred

## 2016-11-09 ENCOUNTER — Telehealth: Payer: Self-pay | Admitting: Internal Medicine

## 2016-11-09 NOTE — Telephone Encounter (Signed)
Spoke with pt, states he found an old VM stating to call our office back.  It appears that this message is from last month calling about cxr results, which have already been relayed to pt.  Nothing further needed.

## 2016-12-21 ENCOUNTER — Encounter (HOSPITAL_COMMUNITY): Payer: Self-pay

## 2016-12-21 ENCOUNTER — Other Ambulatory Visit: Payer: Self-pay

## 2016-12-21 ENCOUNTER — Emergency Department (HOSPITAL_COMMUNITY)
Admission: EM | Admit: 2016-12-21 | Discharge: 2016-12-21 | Disposition: A | Discharge status: Home / Self Care | Payer: BLUE CROSS/BLUE SHIELD | Attending: Physician Assistant | Admitting: Physician Assistant

## 2016-12-21 DIAGNOSIS — Z7982 Long term (current) use of aspirin: Secondary | ICD-10-CM | POA: Diagnosis not present

## 2016-12-21 DIAGNOSIS — Z79899 Other long term (current) drug therapy: Secondary | ICD-10-CM | POA: Diagnosis not present

## 2016-12-21 DIAGNOSIS — J45909 Unspecified asthma, uncomplicated: Secondary | ICD-10-CM | POA: Diagnosis not present

## 2016-12-21 DIAGNOSIS — G8929 Other chronic pain: Secondary | ICD-10-CM | POA: Diagnosis not present

## 2016-12-21 DIAGNOSIS — Z791 Long term (current) use of non-steroidal anti-inflammatories (NSAID): Secondary | ICD-10-CM | POA: Insufficient documentation

## 2016-12-21 DIAGNOSIS — M546 Pain in thoracic spine: Secondary | ICD-10-CM | POA: Insufficient documentation

## 2016-12-21 DIAGNOSIS — M545 Low back pain: Secondary | ICD-10-CM | POA: Insufficient documentation

## 2016-12-21 DIAGNOSIS — Z87891 Personal history of nicotine dependence: Secondary | ICD-10-CM | POA: Diagnosis not present

## 2016-12-21 MED ORDER — IBUPROFEN 600 MG PO TABS: 600.0000 mg | ORAL_TABLET | Freq: Four times a day (QID) | ORAL | 0 refills | Status: AC | PRN

## 2016-12-21 MED ORDER — METHOCARBAMOL 500 MG PO TABS: 1000.0000 mg | ORAL_TABLET | Freq: Two times a day (BID) | ORAL | 0 refills | Status: AC

## 2016-12-21 MED ORDER — IBUPROFEN 400 MG PO TABS
600.0000 mg | ORAL_TABLET | Freq: Once | ORAL | Status: AC
  Administered 2016-12-21: 600 mg via ORAL
  Filled 2016-12-21: qty 1

## 2016-12-21 MED ORDER — PREDNISONE 20 MG PO TABS: 50.0000 mg | ORAL_TABLET | Freq: Once | ORAL | Status: DC
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
## 2016-12-21 NOTE — ED Triage Notes (Signed)
Patient complains of thoracic and lumbar back pain that he reports as chronic, pain In side now with ROM

## 2016-12-27 ENCOUNTER — Telehealth: Payer: Self-pay | Admitting: Internal Medicine

## 2016-12-27 NOTE — Telephone Encounter (Signed)
Spoke with pt, states that his insurance will be in effect prior to 02/13/17 as previously anticipated.  I advised pt that we would be happy to initiate his Ortho referral, but that we would need a copy of his insurance card to ensure that where we refer him is accepted by his insurance.  Pt expressed understanding.  Pt has not yet received a copy of his insurance card but will bring this in as soon as he receives it.  Nothing further needed at this time.

## 2017-01-22 ENCOUNTER — Ambulatory Visit: Payer: Self-pay | Admitting: Internal Medicine

## 2017-02-01 ENCOUNTER — Ambulatory Visit: Payer: Self-pay | Admitting: Internal Medicine

## 2017-02-12 ENCOUNTER — Other Ambulatory Visit: Payer: Self-pay | Admitting: Orthopedic Surgery

## 2017-02-12 ENCOUNTER — Ambulatory Visit
Admission: RE | Admit: 2017-02-12 | Discharge: 2017-02-12 | Disposition: A | Discharge status: Home / Self Care | Payer: BLUE CROSS/BLUE SHIELD | Source: Ambulatory Visit | Attending: Orthopedic Surgery | Admitting: Orthopedic Surgery

## 2017-02-12 DIAGNOSIS — R9389 Abnormal findings on diagnostic imaging of other specified body structures: Secondary | ICD-10-CM

## 2017-02-12 MED ORDER — IOPAMIDOL (ISOVUE-300) INJECTION 61%
75.0000 mL | Freq: Once | INTRAVENOUS | Status: AC | PRN
  Administered 2017-02-12: 75 mL via INTRAVENOUS

## 2017-02-20 ENCOUNTER — Ambulatory Visit (INDEPENDENT_AMBULATORY_CARE_PROVIDER_SITE_OTHER): Payer: BLUE CROSS/BLUE SHIELD | Admitting: Internal Medicine

## 2017-02-20 ENCOUNTER — Encounter: Payer: Self-pay | Admitting: Internal Medicine

## 2017-02-20 VITALS — BP 110/78 | HR 72 | Ht 67.5 in | Wt 218.8 lb

## 2017-02-20 DIAGNOSIS — R05 Cough: Secondary | ICD-10-CM

## 2017-02-20 DIAGNOSIS — D869 Sarcoidosis, unspecified: Secondary | ICD-10-CM | POA: Diagnosis not present

## 2017-02-20 DIAGNOSIS — R058 Other specified cough: Secondary | ICD-10-CM
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
## 2017-02-20 NOTE — Patient Instructions (Addendum)
Be sure you keep dermatology appt  Which will likely show sarcoid in your skin which is treated with a drug called a plaquenil for which you can return here or dermatology can treat if you/ they wish to.   Please schedule a follow up visit in 3 months but call sooner if needed  with all medications /inhalers/ solutions in hand so we can verify exactly what you are taking. This includes all medications from all doctors and over the counters with pfts and cxr

## 2017-02-21 ENCOUNTER — Encounter: Payer: Self-pay | Admitting: Internal Medicine

## 2017-02-21 NOTE — Assessment & Plan Note (Signed)
-   augmentin x 20 days complete 08/03/14 > sinus ct  08/10/14 >Visualized paranasal sinuses are essentially clear. 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

## 2017-02-21 NOTE — Assessment & Plan Note (Signed)
-  Steroid trial 03/06/2011  For clinical dx only > partially  improved  - ESR  08/10/16  = 14  - new skin lesions tip of nostril on R  Since around summer/fall of 2017 > 09/21/2016 referred to derm for tissue dx did not go> referred again 10/23/2016  > to be seen end of Jan 2018   His ct chest was reviewed and slows minimal paryenchymal worsening and note cough/ sob not really an issue now so may well be able to make a dx of skin involvement and offer plaquenil here since prednisone was not particularly effective per pt at helping any of his symptoms attributed to sarcoid (but hard to be sure if they ever really were)  Discussed in detail all the  indications, usual  risks and alternatives  relative to the benefits with patient who agrees to proceed with w/u as outlined.     I had an extended discussion with the patient reviewing all relevant studies completed to date and  lasting 15 to 20 minutes of a 25 minute visit    Each maintenance medication was reviewed in detail including most importantly the difference between maintenance and prns and under what circumstances the prns are to be triggered using an action plan format that is not reflected in the computer generated alphabetically organized AVS.    Please see AVS for specific instructions unique to this visit that I personally wrote and verbalized to the the pt in detail and then reviewed with pt  by my nurse highlighting any  changes in therapy recommended at today's visit to their plan of care.

## 2017-02-26 ENCOUNTER — Encounter (HOSPITAL_COMMUNITY): Payer: Self-pay | Admitting: Emergency Medicine

## 2017-02-26 ENCOUNTER — Emergency Department (HOSPITAL_COMMUNITY)
Admission: EM | Admit: 2017-02-26 | Discharge: 2017-02-26 | Disposition: A | Discharge status: Home / Self Care | Payer: BLUE CROSS/BLUE SHIELD | Attending: Emergency Medicine | Admitting: Emergency Medicine

## 2017-02-26 ENCOUNTER — Emergency Department (HOSPITAL_COMMUNITY): Payer: BLUE CROSS/BLUE SHIELD

## 2017-02-26 ENCOUNTER — Other Ambulatory Visit: Payer: Self-pay

## 2017-02-26 DIAGNOSIS — J45909 Unspecified asthma, uncomplicated: Secondary | ICD-10-CM | POA: Insufficient documentation

## 2017-02-26 DIAGNOSIS — Z79899 Other long term (current) drug therapy: Secondary | ICD-10-CM | POA: Insufficient documentation

## 2017-02-26 DIAGNOSIS — G8929 Other chronic pain: Secondary | ICD-10-CM | POA: Insufficient documentation

## 2017-02-26 DIAGNOSIS — Z7982 Long term (current) use of aspirin: Secondary | ICD-10-CM | POA: Diagnosis not present

## 2017-02-26 DIAGNOSIS — R0789 Other chest pain: Secondary | ICD-10-CM

## 2017-02-26 DIAGNOSIS — R0781 Pleurodynia: Secondary | ICD-10-CM | POA: Insufficient documentation

## 2017-02-26 DIAGNOSIS — Z87891 Personal history of nicotine dependence: Secondary | ICD-10-CM | POA: Diagnosis not present

## 2017-02-26 DIAGNOSIS — M546 Pain in thoracic spine: Secondary | ICD-10-CM | POA: Insufficient documentation

## 2017-02-26 LAB — I-STAT TROPONIN, ED: Troponin i, poc: 0 ng/mL (ref 0.00–0.08)

## 2017-02-26 LAB — BASIC METABOLIC PANEL
Anion gap: 5 (ref 5–15)
BUN: 17 mg/dL (ref 6–20)
CO2: 29 mmol/L (ref 22–32)
Calcium: 8.6 mg/dL — ABNORMAL LOW (ref 8.9–10.3)
Chloride: 106 mmol/L (ref 101–111)
Creatinine, Ser: 0.73 mg/dL (ref 0.61–1.24)
GFR calc Af Amer: >60 mL/min (ref >60)
GFR calc non Af Amer: >60 mL/min (ref >60)
Glucose, Bld: 121 mg/dL — ABNORMAL HIGH (ref 65–99)
Potassium: 3.9 mmol/L (ref 3.5–5.1)
Sodium: 140 mmol/L (ref 135–145)

## 2017-02-26 LAB — CBC
HCT: 40.1 % (ref 39.0–52.0)
Hemoglobin: 13.4 g/dL (ref 13.0–17.0)
MCH: 28.3 pg (ref 26.0–34.0)
MCHC: 33.4 g/dL (ref 30.0–36.0)
MCV: 84.8 fL (ref 78.0–100.0)
Platelets: 270 10*3/uL (ref 150–400)
RBC: 4.73 MIL/uL (ref 4.22–5.81)
RDW: 13.4 % (ref 11.5–15.5)
WBC: 5.8 10*3/uL (ref 4.0–10.5)

## 2017-02-26 NOTE — ED Triage Notes (Signed)
Pt is c/o chest pain and shortness of breath  Pt states he has been having this off and on for about a month  Pt states he has 4 ruptured disc in his back that are pressing on his spine and he questions if this is related  Pt states this morning he has had some sharp pains in his left chest  Pt states he saw his pulmonary dr and they said everything is okay with that

## 2017-02-26 NOTE — ED Provider Notes (Signed)
[facility] DEPT Provider Note   CSN: [id number] Arrival date & time: 02/26/17  0559     History   Chief Complaint Chief Complaint  Patient presents with  . Chest Pain  . Shortness of Breath    HPI Nathan [name] is a 47 y.o. male.  HPI  47 year old male with a history of sarcoidosis and chronic thoracic back pain presents with chest tightness.  He states that for over 1 month he has been getting chest tightness on and off throughout the day.  He thinks it is related to his back pain.  The tightness is left sided. He has been told he has multiple bulging disks in his thoracic back on the left side.  He states that the tightness that started this morning in the middle the night around 5 AM was not different than typical for him.  He did have some associated shortness of breath.  This is also typical.  Throughout the month he has not had exertional pain and nothing seems to make the pain come and go.  He states that his back pain is constant and severe.  He tried muscle relaxers but then saw that chest tightness could be a side effect of this and has not taken it for 1 week.  No abdominal pain.  Past Medical History:  Diagnosis Date  . Asthma    'I think I might have asthma"  . Chronic back pain   . Dyspnea    chronic  . Sarcoidosis     Patient Active Problem List   Diagnosis Date Noted  . Shoulder pain, left 10/23/2016  . Other chest pain 08/22/2014  . Upper airway cough syndrome 07/23/2014  . Sarcoidosis 03/06/2011    History reviewed. No pertinent surgical history.     Home Medications    Prior to Admission medications   Medication Sig Start Date End Date Taking? Authorizing Provider  aspirin EC 81 MG tablet Take 81 mg by mouth daily.    [provider]  Calcium Carb-Cholecalciferol (CALCIUM-VITAMIN D) 500-200 MG-UNIT tablet Take 2 tablets by mouth daily.    [provider]  cholecalciferol (VITAMIN D) 1000  units tablet Take 3,000 Units by mouth daily.    [provider]  dextromethorphan-guaiFENesin (MUCINEX DM) 30-600 MG 12hr tablet Take 1 tablet by mouth 2 (two) times daily.    [provider]    Family History Family History  Problem Relation Age of Onset  . Heart disease Maternal Grandfather   . Heart disease Paternal Uncle   . Prostate cancer Paternal Uncle   . Hypertension Mother   . Diabetes Mother   . Heart attack Father   . Diabetes Father   . Hypertension Father     Social History Social History   Tobacco Use  . Smoking status: Former Smoker    Packs/day: 1.00    Years: 20.00    Pack years: 20.00    Types: Cigarettes    Last attempt to quit: 01/13/2011    Years since quitting: 6.1  . Smokeless tobacco: Never Used  Substance Use Topics  . Alcohol use: No    Alcohol/week: 0.0 oz  . Drug use: No     Allergies   Percocet [oxycodone-acetaminophen] and Vicodin [hydrocodone-acetaminophen]   Review of Systems Review of Systems  Respiratory: Positive for shortness of breath.   Cardiovascular: Positive for chest pain.  Gastrointestinal: Negative for abdominal pain.  Musculoskeletal: Positive for back pain.  All other systems  reviewed and are negative.    Physical Exam Updated Vital Signs BP (!) 142/94 (BP Location: Right Arm)   Pulse 72   Temp 98.6 F (37 C) (Oral)   Resp 18   SpO2 98%   Physical Exam  Constitutional: He is oriented to person, place, and time. He appears well-developed and well-nourished.  Non-toxic appearance. He does not appear ill. No distress.  HENT:  Head: Normocephalic and atraumatic.  Right Ear: External ear normal.  Left Ear: External ear normal.  Nose: Nose normal.  Eyes: Right eye exhibits no discharge. Left eye exhibits no discharge.  Neck: Neck supple.  Cardiovascular: Normal rate, regular rhythm and normal heart sounds.  Pulmonary/Chest: Effort normal and breath sounds normal. He exhibits tenderness  (mild).    Abdominal: Soft. There is no tenderness.  Musculoskeletal: He exhibits no edema.       Thoracic back: He exhibits tenderness.       Back:  Neurological: He is alert and oriented to person, place, and time.  Skin: Skin is warm and dry.  Nursing note and vitals reviewed.    ED Treatments / Results  Labs (all labs ordered are listed, but only abnormal results are displayed) Labs Reviewed  BASIC METABOLIC PANEL - Abnormal; Notable for the following components:      Result Value   Glucose, Bld 121 (*)    Calcium 8.6 (*)    All other components within normal limits  CBC  I-STAT TROPONIN, ED    EKG  EKG Interpretation  Date/Time:  Monday February 26 2017 06:28:14 EST Ventricular Rate:  77 PR Interval:    QRS Duration: 96 QT Interval:  365 QTC Calculation: 413 R Axis:   30 Text Interpretation:  Sinus rhythm Normal ECG No significant change since last tracing 13 Aug 2016 Confirmed by Rolland Porter [phone]) on 02/26/2017 6:33:41 AM       Radiology Dg Chest 2 View  Result Date: 02/26/2017 CLINICAL DATA:  Mid left chest pain and shortness of breath for the past month. Former smoker. History of sarcoidosis. EXAM: CHEST  2 VIEW COMPARISON:  Chest x-ray of September 21, 2016 and chest CT scan of February 12, 2017 FINDINGS: The right hemidiaphragm remains higher than the left. The lungs are borderline hypoinflated. There is no alveolar infiltrate nor evidence of parenchymal masses. There are coarse interstitial lung markings in the left upper and lower lung which appears stable. The hilar structures remain prominent. The heart and pulmonary vascularity are normal. There is no pleural effusion. The bony thorax is unremarkable. IMPRESSION: No pneumonia nor CHF. Stable hilar lymphadenopathy. Subsegmental atelectasis or fibrotic change in the left upper and lower lung which appears stable allowing for differences in radiographic technique. Electronically Signed   By: David  Martinique M.D.    On: 02/26/2017 07:34    Procedures Procedures (including critical care time)  Medications Ordered in ED Medications - No data to display   Initial Impression / Assessment and Plan / ED Course  I have reviewed the triage vital signs and the nursing notes.  Pertinent labs & imaging results that were available during my care of the patient were reviewed by me and considered in my medical decision making (see chart for details).     My suspicion for ACS, PE, dissection is quite low.  This is been an ongoing problem for about 1 month.  It is not different today, he just want to make sure it was not a heart attack.  We discussed  getting a second troponin but at this point patient would like to be discharged.  Given my low suspicion, I do not think further testing is particularly indicated.  While his symptoms did start only about 4 hours ago, I do not think ACS is likely and since this is been an on and off thing all day, every day for a month, I believe he is stable for discharge.  Follow-up with PCP.  Final Clinical Impressions(s) / ED Diagnoses   Final diagnoses:  Chest tightness  Chronic left-sided thoracic back pain    ED Discharge Orders    None       Sherwood Gambler, MD 02/26/17 [phone]

## 2017-03-20 ENCOUNTER — Other Ambulatory Visit: Payer: Self-pay | Admitting: Orthopedic Surgery

## 2017-03-20 DIAGNOSIS — M542 Cervicalgia: Secondary | ICD-10-CM

## 2017-03-26 ENCOUNTER — Encounter: Payer: Self-pay | Admitting: Internal Medicine

## 2017-03-26 ENCOUNTER — Other Ambulatory Visit (INDEPENDENT_AMBULATORY_CARE_PROVIDER_SITE_OTHER): Payer: BLUE CROSS/BLUE SHIELD

## 2017-03-26 ENCOUNTER — Ambulatory Visit (INDEPENDENT_AMBULATORY_CARE_PROVIDER_SITE_OTHER): Payer: BLUE CROSS/BLUE SHIELD | Admitting: Internal Medicine

## 2017-03-26 DIAGNOSIS — D869 Sarcoidosis, unspecified: Secondary | ICD-10-CM

## 2017-03-26 DIAGNOSIS — R05 Cough: Secondary | ICD-10-CM | POA: Diagnosis not present

## 2017-03-26 DIAGNOSIS — R059 Cough, unspecified: Secondary | ICD-10-CM

## 2017-03-26 DIAGNOSIS — R058 Other specified cough: Secondary | ICD-10-CM

## 2017-03-26 LAB — CBC WITH DIFFERENTIAL/PLATELET
Basophils Absolute: 0 10*3/uL (ref 0.0–0.1)
Basophils Relative: 0.6 % (ref 0.0–3.0)
Eosinophils Absolute: 0.3 10*3/uL (ref 0.0–0.7)
Eosinophils Relative: 6 % — ABNORMAL HIGH (ref 0.0–5.0)
HCT: 44.1 % (ref 39.0–52.0)
Hemoglobin: 14.8 g/dL (ref 13.0–17.0)
Lymphocytes Relative: 16.2 % (ref 12.0–46.0)
Lymphs Abs: 0.9 10*3/uL (ref 0.7–4.0)
MCHC: 33.7 g/dL (ref 30.0–36.0)
MCV: 84.7 fl (ref 78.0–100.0)
Monocytes Absolute: 0.6 10*3/uL (ref 0.1–1.0)
Monocytes Relative: 11.8 % (ref 3.0–12.0)
Neutro Abs: 3.6 10*3/uL (ref 1.4–7.7)
Neutrophils Relative %: 65.4 % (ref 43.0–77.0)
Platelets: 298 10*3/uL (ref 150.0–400.0)
RBC: 5.2 Mil/uL (ref 4.22–5.81)
RDW: 13.6 % (ref 11.5–15.5)
WBC: 5.5 10*3/uL (ref 4.0–10.5)

## 2017-03-26 LAB — HEPATIC FUNCTION PANEL
ALT: 24 U/L (ref 0–53)
AST: 22 U/L (ref 0–37)
Albumin: 4.3 g/dL (ref 3.5–5.2)
Alkaline Phosphatase: 68 U/L (ref 39–117)
Bilirubin, Direct: 0.2 mg/dL (ref 0.0–0.3)
Total Bilirubin: 0.9 mg/dL (ref 0.2–1.2)
Total Protein: 7.6 g/dL (ref 6.0–8.3)

## 2017-03-26 LAB — SEDIMENTATION RATE: Sed Rate: 16 mm/hr — ABNORMAL HIGH (ref 0–15)

## 2017-03-26 MED ORDER — FAMOTIDINE 20 MG PO TABS: ORAL_TABLET | ORAL | 2 refills | Status: DC

## 2017-03-26 MED ORDER — PANTOPRAZOLE SODIUM 40 MG PO TBEC: 40.0000 mg | DELAYED_RELEASE_TABLET | Freq: Every day | ORAL | 2 refills | Status: DC

## 2017-03-26 NOTE — Patient Instructions (Addendum)
Pantoprazole (protonix) 40 mg   Take  30-60 min before first meal of the day and Pepcid (famotidine)  20 mg one @  bedtime until return to office - this is the best way to tell whether stomach acid is contributing to your problem.    GERD (REFLUX)  is an extremely common cause of respiratory symptoms just like yours , many times with no obvious heartburn at all.    It can be treated with medication, but also with lifestyle changes including elevation of the head of your bed (ideally with 6 inch  bed blocks),  Smoking cessation, avoidance of late meals, excessive alcohol, and avoid fatty foods, chocolate, peppermint, colas, red wine, and acidic juices such as orange juice.  NO MINT OR MENTHOL PRODUCTS SO NO COUGH DROPS  USE SUGARLESS CANDY INSTEAD (Jolley ranchers or Stover's or Life Savers) or even ice chips will also do - the key is to swallow to prevent all throat clearing. NO OIL BASED VITAMINS - use powdered substitutes.     Please see patient coordinator before you leave today  to schedule sinus ct and I will call you with results   Please remember to go to the lab department downstairs in the basement  for your tests - we will call you with the results when they are available.      Please schedule a follow up office visit in 4 weeks, sooner if needed  with all medications /inhalers/ solutions in hand so we can verify exactly what you are taking. This includes all medications from all doctors and over the counters

## 2017-03-26 NOTE — Assessment & Plan Note (Signed)
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  = 16 - ACE level 03/26/2017  =     Really nothing to suggest active pulmonary sarcoid  Or any relation between his chronic symptoms and his rash/ ct chest which appears to be regressing in terms of showing any active dz   Discussed in detail all the  indications, usual  risks and alternatives  relative to the benefits with patient who agrees to proceed with conservative f/u as outlined  s rx other than attempting to address UACS (see separate a/p)   I had an extended discussion with the patient reviewing all relevant studies completed to date and  lasting 15 to 20 minutes of a 25 minute visit    Each maintenance medication was reviewed in detail including most importantly the difference between maintenance and prns and under what circumstances the prns are to be triggered using an action plan format that is not reflected in the computer generated alphabetically organized AVS.    Please see AVS for specific instructions unique to this visit that I personally wrote and verbalized to the the pt in detail and then reviewed with pt  by my nurse highlighting any  changes in therapy recommended at today's visit to their plan of care.
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
## 2017-03-26 NOTE — Assessment & Plan Note (Addendum)
-   augmentin x 20 days complete 08/03/14 > sinus ct  08/10/14 >Visualized paranasal sinuses are essentially clear. Minimal layering fluid in the left sphenoid sinus.> refer ent  > notified 08/12/2014 would have to pay cash and wishes to delay until has insurance  - max rx for GERD 03/26/2017 >>>  - Allergy profile 03/26/2017 >  Eos 0. /  IgE  Pending  - repeat sinus CT rec 03/26/2017   Upper airway cough syndrome (previously labeled PNDS),  is so named because it's frequently impossible to sort out how much is  CR/sinusitis with freq throat clearing (which can be related to primary GERD)   vs  causing  secondary (" extra esophageal")  GERD from wide swings in gastric pressure that occur with throat clearing, often  promoting self use of mint and menthol lozenges that reduce the lower esophageal sphincter tone and exacerbate the problem further in a cyclical fashion.   These are the same pts (now being labeled as having "irritable larynx syndrome" by some cough centers) who not infrequently have a history of having failed to tolerate ace inhibitors,  dry powder inhalers or biphosphonates or report having atypical/extraesophageal reflux symptoms that don't respond to standard doses of PPI  and are easily confused as having aecopd or asthma flares by even experienced allergists/ pulmonologists (myself included).   Will try max rs for gerd and f/u with sinus CT w/a

## 2017-03-27 LAB — RESPIRATORY ALLERGY PROFILE REGION II ~~LOC~~
Allergen, A. alternata, m6: <0.1 kU/L
Allergen, Cedar tree, t12: <0.1 kU/L
Allergen, Comm Silver Birch, t9: <0.1 kU/L
Allergen, Cottonwood, t14: <0.1 kU/L
Allergen, D pternoyssinus,d7: 0.25 kU/L — ABNORMAL HIGH
Allergen, Mouse Urine Protein, e78: <0.1 kU/L
Allergen, Mulberry, t76: <0.1 kU/L
Allergen, Oak,t7: <0.1 kU/L
Allergen, P. notatum, m1: <0.1 kU/L
Aspergillus fumigatus, m3: <0.1 kU/L
Bermuda Grass: <0.1 kU/L
Box Elder IgE: <0.1 kU/L
CLADOSPORIUM HERBARUM (M2) IGE: <0.1 kU/L
COMMON RAGWEED (SHORT) (W1) IGE: <0.1 kU/L
Cat Dander: <0.1 kU/L
Class: 0
Class: 0
Class: 0
Class: 0
Class: 0
Class: 0
Class: 0
Class: 0
Class: 0
Class: 0
Class: 0
Class: 0
Class: 0
Class: 0
Class: 0
Class: 0
Class: 0
Class: 0
Class: 0
Class: 0
Class: 0
Class: 0
Class: 0
Class: 0
Cockroach: 0.33 kU/L — ABNORMAL HIGH
D. farinae: 0.23 kU/L — ABNORMAL HIGH
Dog Dander: <0.1 kU/L
Elm IgE: <0.1 kU/L
IgE (Immunoglobulin E), Serum: 22 kU/L (ref <=114)
Johnson Grass: <0.1 kU/L
Pecan/Hickory Tree IgE: <0.1 kU/L
Rough Pigweed  IgE: <0.1 kU/L
Sheep Sorrel IgE: <0.1 kU/L
Timothy Grass: <0.1 kU/L

## 2017-03-27 LAB — INTERPRETATION:

## 2017-03-27 LAB — ANGIOTENSIN CONVERTING ENZYME: Angiotensin-Converting Enzyme: 33 U/L (ref 9–67)

## 2017-03-27 NOTE — Progress Notes (Signed)
Spoke with pt and notified of results per Dr. Wert. Pt verbalized understanding and denied any questions. 

## 2017-03-28 ENCOUNTER — Telehealth: Payer: Self-pay | Admitting: Internal Medicine

## 2017-03-28 NOTE — Telephone Encounter (Signed)
Spoke with pt, advised I was not sure why he was called. I would search further and if anything we would call him back. Nothing further is needed,

## 2017-03-30 ENCOUNTER — Inpatient Hospital Stay: Admission: RE | Admit: 2017-03-30 | Payer: BLUE CROSS/BLUE SHIELD | Source: Ambulatory Visit

## 2017-04-05 ENCOUNTER — Ambulatory Visit (INDEPENDENT_AMBULATORY_CARE_PROVIDER_SITE_OTHER)
Admission: RE | Admit: 2017-04-05 | Discharge: 2017-04-05 | Disposition: A | Discharge status: Home / Self Care | Payer: BLUE CROSS/BLUE SHIELD | Source: Ambulatory Visit | Attending: Internal Medicine | Admitting: Internal Medicine

## 2017-04-05 DIAGNOSIS — R05 Cough: Secondary | ICD-10-CM | POA: Diagnosis not present

## 2017-04-05 DIAGNOSIS — R058 Other specified cough: Secondary | ICD-10-CM

## 2017-04-05 DIAGNOSIS — R059 Cough, unspecified: Secondary | ICD-10-CM

## 2017-04-06 NOTE — Progress Notes (Signed)
Called preferred number was spouse VM, called house phone and [facility]

## 2017-04-09 NOTE — Progress Notes (Signed)
Spoke with pt and notified of results per Dr. Wert. Pt verbalized understanding and denied any questions. 

## 2017-04-26 ENCOUNTER — Ambulatory Visit (INDEPENDENT_AMBULATORY_CARE_PROVIDER_SITE_OTHER): Payer: BLUE CROSS/BLUE SHIELD | Admitting: Internal Medicine

## 2017-04-26 ENCOUNTER — Encounter: Payer: Self-pay | Admitting: Internal Medicine

## 2017-04-26 VITALS — BP 116/80 | HR 86 | Ht 67.0 in | Wt 237.6 lb

## 2017-04-26 DIAGNOSIS — R058 Other specified cough: Secondary | ICD-10-CM

## 2017-04-26 DIAGNOSIS — G8929 Other chronic pain: Secondary | ICD-10-CM | POA: Diagnosis not present

## 2017-04-26 DIAGNOSIS — R05 Cough: Secondary | ICD-10-CM | POA: Diagnosis not present

## 2017-04-26 DIAGNOSIS — M25512 Pain in left shoulder: Secondary | ICD-10-CM

## 2017-04-26 DIAGNOSIS — D869 Sarcoidosis, unspecified: Secondary | ICD-10-CM | POA: Diagnosis not present

## 2017-04-26 NOTE — Patient Instructions (Signed)
Keep appt for lung functions
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
## 2017-04-27 ENCOUNTER — Ambulatory Visit: Payer: BLUE CROSS/BLUE SHIELD | Admitting: Internal Medicine

## 2017-04-29 ENCOUNTER — Encounter: Payer: Self-pay | Admitting: Internal Medicine

## 2017-04-29 NOTE — Assessment & Plan Note (Signed)
-   augmentin x 20 days complete 08/03/14 > sinus ct  08/10/14 >Visualized paranasal sinuses are essentially clear. Minimal layering fluid in the left sphenoid sinus.> refer ent  > notified 08/12/2014 would have to pay cash and wishes to delay until has insurance  - max rx for GERD 03/26/2017 >>>  - Allergy profile 03/26/2017 >  Eos 0.3 /  IgE 22 rast neg   - repeat sinus CT 04/05/2017 Negative exam. Clear paranasal sinuses.  Not consistent with gerd rx but if flares needs full 24 h suppression before further w/u - for now just taking pepcid 20 mg qhs

## 2017-04-29 NOTE — Assessment & Plan Note (Addendum)
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  = 16 - ACE level 03/26/2017  =  33    Very little evidence of active sarcoid at this point, so no need for lung bx or rx either   Does have mild doe > f/u pfts rec   Discussed in detail all the  indications, usual  risks and alternatives  relative to the benefits with patient who agrees to proceed with conservative f/u as outlined    Each maintenance medication was reviewed in detail including most importantly the difference between maintenance and as needed and under what circumstances the prns are to be used.  Please see AVS for specific  Instructions which are unique to this visit and I personally typed out  which were reviewed in detail in writing with the patient and a copy provided.

## 2017-04-29 NOTE — Assessment & Plan Note (Signed)
rec continue rx with mobic prn, f/u with ortho as planned

## 2017-05-21 ENCOUNTER — Ambulatory Visit: Payer: BLUE CROSS/BLUE SHIELD | Admitting: Internal Medicine

## 2017-06-08 ENCOUNTER — Ambulatory Visit: Payer: BLUE CROSS/BLUE SHIELD | Admitting: Internal Medicine

## 2017-06-08 ENCOUNTER — Ambulatory Visit (INDEPENDENT_AMBULATORY_CARE_PROVIDER_SITE_OTHER): Payer: Self-pay | Admitting: Internal Medicine

## 2017-06-08 ENCOUNTER — Encounter: Payer: Self-pay | Admitting: Internal Medicine

## 2017-06-08 VITALS — BP 116/88 | HR 73 | Ht 67.0 in | Wt 240.0 lb

## 2017-06-08 DIAGNOSIS — R058 Other specified cough: Secondary | ICD-10-CM

## 2017-06-08 DIAGNOSIS — D869 Sarcoidosis, unspecified: Secondary | ICD-10-CM

## 2017-06-08 DIAGNOSIS — R05 Cough: Secondary | ICD-10-CM

## 2017-06-08 LAB — PULMONARY FUNCTION TEST
DL/VA % pred: 103 %
DL/VA: 4.74 ml/min/mmHg/L
DLCO unc % pred: 57 %
DLCO unc: 17.03 ml/min/mmHg
FEF 25-75 Post: 0.6 L/sec
FEF 25-75 Pre: 1.87 L/sec
FEF2575-%Change-Post: -67 %
FEF2575-%Pred-Post: 17 %
FEF2575-%Pred-Pre: 53 %
FEV1-%Change-Post: -13 %
FEV1-%Pred-Post: 46 %
FEV1-%Pred-Pre: 54 %
FEV1-Post: 1.58 L
FEV1-Pre: 1.83 L
FEV1FVC-%Change-Post: 2 %
FEV1FVC-%Pred-Pre: 99 %
FEV6-%Change-Post: -15 %
FEV6-%Pred-Post: 45 %
FEV6-%Pred-Pre: 53 %
FEV6-Post: 1.85 L
FEV6-Pre: 2.19 L
FEV6FVC-%Change-Post: 0 %
FEV6FVC-%Pred-Post: 101 %
FEV6FVC-%Pred-Pre: 101 %
FVC-%Change-Post: -15 %
FVC-%Pred-Post: 46 %
FVC-%Pred-Pre: 54 %
FVC-Post: 1.92 L
FVC-Pre: 2.27 L
Post FEV1/FVC ratio: 83 %
Post FEV6/FVC ratio: 100 %
Pre FEV1/FVC ratio: 80 %
Pre FEV6/FVC Ratio: 99 %
RV % pred: 82 %
RV: 1.52 L
TLC % pred: 57 %
TLC: 3.87 L
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
## 2017-06-08 NOTE — Assessment & Plan Note (Signed)
-   augmentin x 20 days complete 08/03/14 > sinus ct  08/10/14 >Visualized paranasal sinuses are essentially clear. Minimal layering fluid in the left sphenoid sinus.> refer ent  > notified 08/12/2014 would have to pay cash and wishes to delay until has insurance  - max rx for GERD 03/26/2017 >>>  - Allergy profile 03/26/2017 >  Eos 0.3 /  IgE 22 rast neg   - repeat sinus CT 04/05/2017 Negative exam. Clear paranasal sinuses.  No change recs > ent f/u prn

## 2017-06-08 NOTE — Progress Notes (Signed)
PFT completed today.  

## 2017-06-08 NOTE — Patient Instructions (Addendum)
No change in medications > skin doctor needs to refill the cream for your face   Please schedule a follow up visit in 6  months but call sooner if needed

## 2017-06-08 NOTE — Assessment & Plan Note (Signed)
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  = 16 - ACE level 03/26/2017  =  33   - PFT's  06/08/2017  FVC 2.27 (85%) no obst  with DLCO  105 % corrects to 132  % for alv volume   No evidence of active pulmonary sarcoid, main issue is nasal rash on R > f/u derm and here q 6 m  Each maintenance medication was reviewed in detail including most importantly the difference between maintenance and as needed and under what circumstances the prns are to be used.  Please see AVS for specific  Instructions which are unique to this visit and I personally typed out  which were reviewed in detail in writing with the patient and a copy provided.

## 2017-06-28 ENCOUNTER — Other Ambulatory Visit: Payer: Self-pay

## 2017-06-28 ENCOUNTER — Encounter (HOSPITAL_COMMUNITY): Payer: Self-pay | Admitting: Emergency Medicine

## 2017-06-28 DIAGNOSIS — K029 Dental caries, unspecified: Secondary | ICD-10-CM | POA: Insufficient documentation

## 2017-06-28 DIAGNOSIS — K0889 Other specified disorders of teeth and supporting structures: Secondary | ICD-10-CM | POA: Insufficient documentation

## 2017-06-28 DIAGNOSIS — Z87891 Personal history of nicotine dependence: Secondary | ICD-10-CM | POA: Insufficient documentation

## 2017-06-28 DIAGNOSIS — Z79899 Other long term (current) drug therapy: Secondary | ICD-10-CM | POA: Insufficient documentation
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
## 2017-06-29 ENCOUNTER — Emergency Department (HOSPITAL_COMMUNITY)
Admission: EM | Admit: 2017-06-29 | Discharge: 2017-06-29 | Disposition: A | Discharge status: Home / Self Care | Payer: Self-pay | Attending: Emergency Medicine | Admitting: Emergency Medicine

## 2017-06-29 DIAGNOSIS — K0889 Other specified disorders of teeth and supporting structures: Secondary | ICD-10-CM

## 2017-06-29 DIAGNOSIS — K029 Dental caries, unspecified: Secondary | ICD-10-CM

## 2017-06-29 MED ORDER — PENICILLIN V POTASSIUM 500 MG PO TABS
500.0000 mg | ORAL_TABLET | Freq: Once | ORAL | Status: AC
  Administered 2017-06-29: 500 mg via ORAL
  Filled 2017-06-29: qty 1

## 2017-06-29 MED ORDER — IBUPROFEN 800 MG PO TABS
800.0000 mg | ORAL_TABLET | Freq: Once | ORAL | Status: AC
  Administered 2017-06-29: 800 mg via ORAL
  Filled 2017-06-29: qty 1

## 2017-06-29 MED ORDER — IBUPROFEN 800 MG PO TABS: 800.0000 mg | ORAL_TABLET | Freq: Three times a day (TID) | ORAL | 0 refills | Status: DC

## 2017-06-29 MED ORDER — PENICILLIN V POTASSIUM 500 MG PO TABS: 1000.0000 mg | ORAL_TABLET | Freq: Two times a day (BID) | ORAL | 0 refills | Status: DC

## 2017-06-29 NOTE — Discharge Instructions (Addendum)
1. Medications: Ibuprofen, penicillin, usual home medications 2. Treatment: rest, drink plenty of fluids, take medications as prescribed 3. Follow Up: Please followup with dentistry within 1 week for discussion of your diagnoses and further evaluation after today's visit; if you do not have a primary care doctor use the resource guide provided to find one; Return to the ER for high fevers, difficulty breathing, difficulty swallowing or other concerning symptoms

## 2017-06-29 NOTE — ED Provider Notes (Signed)
[facility] DEPT Provider Note   CSN: [id number] Arrival date & time: 06/28/17  2211     History   Chief Complaint Chief Complaint  Patient presents with  . Dental Pain    HPI Nathan [name] is a 47 y.o. male with a hx of asthma, chronic back pain, sarcoidosis presents to the Emergency Department complaining of gradual, persistent, progressively worsening left upper dental pain onset 2 days ago but acutely worsening tonight.  Patient reports it has been many years since he saw a dentist.  He reports eating and drinking make his pain significantly worse.  He has not taken any medications prior to arrival.  Patient reports known dental caries.  He states associated generalized and throbbing headache.  No difficulty breathing or swallowing.  No fevers or chills, nausea or vomiting.  No neck stiffness.  The history is provided by the patient and medical records. No language interpreter was used.    Past Medical History:  Diagnosis Date  . Asthma    'I think I might have asthma"  . Chronic back pain   . Dyspnea    chronic  . Sarcoidosis     Patient Active Problem List   Diagnosis Date Noted  . Shoulder pain, left 10/23/2016  . Other chest pain 08/22/2014  . Upper airway cough syndrome 07/23/2014  . Sarcoidosis 03/06/2011    History reviewed. No pertinent surgical history.      Home Medications    Prior to Admission medications   Medication Sig Start Date End Date Taking? Authorizing Provider  famotidine (PEPCID) 20 MG tablet One at bedtime Patient not taking: Reported on 06/08/2017 03/26/17   Tanda Rockers, MD  ibuprofen (ADVIL,MOTRIN) 800 MG tablet Take 1 tablet (800 mg total) by mouth 3 (three) times daily. 06/29/17   Angelis Gates, Jarrett Soho, PA-C  meloxicam (MOBIC) 15 MG tablet Take 15 mg by mouth daily.    [provider]  metroNIDAZOLE (METROCREAM) 0.75 % cream Apply 1 application topically 2 (two) times daily.    [provider]  penicillin v potassium (VEETID) 500 MG tablet Take 2 tablets (1,000 mg total) by mouth 2 (two) times daily. X 7 days 06/29/17   Jeremiah Tarpley, Jarrett Soho, PA-C    Family History Family History  Problem Relation Age of Onset  . Heart disease Maternal Grandfather   . Heart disease Paternal Uncle   . Prostate cancer Paternal Uncle   . Hypertension Mother   . Diabetes Mother   . Heart attack Father   . Diabetes Father   . Hypertension Father     Social History Social History   Tobacco Use  . Smoking status: Former Smoker    Packs/day: 1.00    Years: 20.00    Pack years: 20.00    Types: Cigarettes    Last attempt to quit: 01/13/2011    Years since quitting: 6.4  . Smokeless tobacco: Never Used  Substance Use Topics  . Alcohol use: No    Alcohol/week: 0.0 oz  . Drug use: No     Allergies   Percocet [oxycodone-acetaminophen] and Vicodin [hydrocodone-acetaminophen]   Review of Systems Review of Systems  Constitutional: Negative for appetite change, chills and fever.  HENT: Positive for dental problem. Negative for drooling, ear pain, facial swelling, nosebleeds, postnasal drip, rhinorrhea and trouble swallowing.   Eyes: Negative for pain and redness.  Respiratory: Negative for cough and wheezing.   Cardiovascular: Negative for chest pain.  Gastrointestinal: Negative for abdominal pain,  nausea and vomiting.  Musculoskeletal: Negative for neck pain and neck stiffness.  Skin: Negative for color change and rash.  Neurological: Positive for headaches (generalized). Negative for weakness and light-headedness.  All other systems reviewed and are negative.    Physical Exam Updated Vital Signs BP (!) 133/96 (BP Location: Right Arm)   Pulse 78   Temp 98.5 F (36.9 C) (Oral)   Resp 16   Ht 5\' 8"  (1.727 m)   Wt 111.4 kg (245 lb 11.2 oz)   SpO2 97%   BMI 37.36 kg/m   Physical Exam  Constitutional: He appears well-developed and well-nourished.  HENT:  Head:  Normocephalic.  Right Ear: Tympanic membrane, external ear and ear canal normal.  Left Ear: Tympanic membrane, external ear and ear canal normal.  Nose: Nose normal. Right sinus exhibits no maxillary sinus tenderness and no frontal sinus tenderness. Left sinus exhibits no maxillary sinus tenderness and no frontal sinus tenderness.  Mouth/Throat: Uvula is midline, oropharynx is clear and moist and mucous membranes are normal. No oral lesions. Abnormal dentition. Dental caries present. No uvula swelling or lacerations. No oropharyngeal exudate, posterior oropharyngeal edema, posterior oropharyngeal erythema or tonsillar abscesses.  Poor dentition throughout with many dental caries.  Erythema and edema of the gingiva surrounding the left upper molars No gross abscess No fluctuance or induration to the buccal mucosa or floor of the mouth  Eyes: Conjunctivae are normal. Right eye exhibits no discharge. Left eye exhibits no discharge.  Neck: Normal range of motion. Neck supple.  No stridor Handling secretions without difficulty No nuchal rigidity No cervical lymphadenopathy  Cardiovascular: Normal rate, regular rhythm and normal heart sounds.  Pulmonary/Chest: Effort normal. No respiratory distress.  Equal chest rise  Abdominal: Soft. Bowel sounds are normal. He exhibits no distension. There is no tenderness.  Lymphadenopathy:       Head (right side): No submental, no submandibular, no tonsillar, no preauricular, no posterior auricular and no occipital adenopathy present.       Head (left side): Tonsillar adenopathy present. No submental, no submandibular, no preauricular, no posterior auricular and no occipital adenopathy present.    He has no cervical adenopathy.  Neurological: He is alert.  Skin: Skin is warm and dry.  Psychiatric: He has a normal mood and affect.  Nursing note and vitals reviewed.    ED Treatments / Results   Procedures Procedures (including critical care  time)  Medications Ordered in ED Medications  ibuprofen (ADVIL,MOTRIN) tablet 800 mg (800 mg Oral Given 06/29/17 0322)  penicillin v potassium (VEETID) tablet 500 mg (500 mg Oral Given 06/29/17 0322)     Initial Impression / Assessment and Plan / ED Course  I have reviewed the triage vital signs and the nursing notes.  Pertinent labs & imaging results that were available during my care of the patient were reviewed by me and considered in my medical decision making (see chart for details).     Patient with toothache.  No gross abscess.  Exam unconcerning for Ludwig's angina or spread of infection.  Will treat with penicillin and anti-inflammatories medicine.  Urged patient to follow-up with dentist.     Final Clinical Impressions(s) / ED Diagnoses   Final diagnoses:  Pain, dental  Dental caries    ED Discharge Orders        Ordered    penicillin v potassium (VEETID) 500 MG tablet  2 times daily     06/29/17 0326    ibuprofen (ADVIL,MOTRIN) 800 MG tablet  3 times daily     06/29/17 0326       Khristie Sak, Gwenlyn Perking 06/29/17 0327    Ripley Fraise, MD 06/29/17 [phone]

## 2017-08-06 ENCOUNTER — Emergency Department (HOSPITAL_COMMUNITY)
Admission: EM | Admit: 2017-08-06 | Discharge: 2017-08-06 | Disposition: A | Discharge status: Home / Self Care | Payer: Self-pay | Attending: Physician Assistant | Admitting: Physician Assistant

## 2017-08-06 ENCOUNTER — Encounter (HOSPITAL_COMMUNITY): Payer: Self-pay | Admitting: Emergency Medicine

## 2017-08-06 ENCOUNTER — Other Ambulatory Visit: Payer: Self-pay

## 2017-08-06 ENCOUNTER — Emergency Department (HOSPITAL_COMMUNITY): Payer: Self-pay

## 2017-08-06 DIAGNOSIS — X58XXXA Exposure to other specified factors, initial encounter: Secondary | ICD-10-CM | POA: Insufficient documentation

## 2017-08-06 DIAGNOSIS — Z87891 Personal history of nicotine dependence: Secondary | ICD-10-CM | POA: Insufficient documentation

## 2017-08-06 DIAGNOSIS — Y939 Activity, unspecified: Secondary | ICD-10-CM | POA: Insufficient documentation

## 2017-08-06 DIAGNOSIS — S92501A Displaced unspecified fracture of right lesser toe(s), initial encounter for closed fracture: Secondary | ICD-10-CM

## 2017-08-06 DIAGNOSIS — Y990 Civilian activity done for income or pay: Secondary | ICD-10-CM | POA: Insufficient documentation

## 2017-08-06 DIAGNOSIS — S92515A Nondisplaced fracture of proximal phalanx of left lesser toe(s), initial encounter for closed fracture: Secondary | ICD-10-CM | POA: Insufficient documentation

## 2017-08-06 DIAGNOSIS — Z79899 Other long term (current) drug therapy: Secondary | ICD-10-CM | POA: Insufficient documentation

## 2017-08-06 DIAGNOSIS — Y929 Unspecified place or not applicable: Secondary | ICD-10-CM | POA: Insufficient documentation

## 2017-08-06 DIAGNOSIS — J45909 Unspecified asthma, uncomplicated: Secondary | ICD-10-CM | POA: Insufficient documentation

## 2017-08-06 DIAGNOSIS — M79641 Pain in right hand: Secondary | ICD-10-CM

## 2017-08-06 MED ORDER — IBUPROFEN 400 MG PO TABS
600.0000 mg | ORAL_TABLET | Freq: Once | ORAL | Status: AC
  Administered 2017-08-06: 600 mg via ORAL
  Filled 2017-08-06: qty 1

## 2017-08-06 MED ORDER — NAPROXEN 500 MG PO TABS: 500.0000 mg | ORAL_TABLET | Freq: Two times a day (BID) | ORAL | 0 refills | Status: DC

## 2017-08-06 NOTE — Discharge Instructions (Signed)
Please read and follow all provided instructions.  You have been seen today for right hand/wrist pain and left 5th toe pain  Tests performed today include: An x-ray of the affected area - does NOT show any broken bones or dislocations of the hand or writst.  An xray of the foot - showed a fracture of the 5th toe.  Vital signs. See below for your results today.   Home care instructions: -- *PRICE in the first 24-48 hours after injury: Protect (with brace, splint, sling), if given by your provider Rest Ice- Do not apply ice pack directly to your skin, place towel or similar between your skin and ice/ice pack. Apply ice for 20 min, then remove for 40 min while awake Compression- Wear brace, elastic bandage, splint as directed by your provider Elevate affected extremity above the level of your heart when not walking around for the first 24-48 hours   Use Ibuprofen (Motrin/Advil) 600mg  every 6 hours as needed for pain (do not exceed max dose in 24 hours, 2400mg )  Follow-up instructions: Please follow-up with your primary care provider or the provided orthopedic physician (bone specialist) if you continue to have significant pain in 1 week. In this case you may have a more severe injury that requires further care.   Return instructions:  You develop fever Please return if your toes or feet are numb or tingling, appear gray or blue, or you have severe pain (also elevate the leg and loosen splint or wrap if you were given one) Please return to the Emergency Department if you experience worsening symptoms.  Please return if you have any other emergent concerns. Additional Information:  Your vital signs today were: BP 123/82 (BP Location: Left Arm)    Pulse 69    Temp 98.7 F (37.1 C) (Oral)    Resp 16    Ht 5\' 7"  (1.702 m)    Wt 104.3 kg (230 lb)    SpO2 99%    BMI 36.02 kg/m  If your blood pressure (BP) was elevated above 135/85 this visit, please have this repeated by your doctor within one  month. ---------------

## 2017-08-06 NOTE — ED Triage Notes (Signed)
Pt complains of right wrist pain- swelling to right fingers. 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 Pt also complains of right pinky toe pain. He dropped something on it.

## 2017-08-06 NOTE — ED Notes (Signed)
Patient transported to X-ray 

## 2017-08-06 NOTE — ED Provider Notes (Signed)
[facility] EMERGENCY DEPARTMENT Provider Note   CSN: [id number] Arrival date & time: 08/06/17  4580     History   Chief Complaint Chief Complaint  Patient presents with  . Arm Pain  . Toe Pain    HPI Nathan [name] is a 47 y.o. right-handed male who presents emergency department today for right wrist and hand pain.  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  He reports he has been having pain along the base of his right thumb that is worse with opposition.  He reports that he also has pain along the PIP of his knuckles occasionally.  He notes it is worse with range of motion but he does not have limitation.  He notes he has tried Aspercreme for his symptoms without any relief.  He denies any numbness/tingling/weakness.  He denies any trauma.  No open wounds.  He denies any joint swelling, overlying erythema or heat.  Patient reports he also has right fifth toe pain after dropping something on it approximately 1 week ago.  He notes it is worse with ambulation also palpation.  No interventions prior to arrival.  He denies any numbness/tingling or decreased range of motion.  No open wounds.  HPI  Past Medical History:  Diagnosis Date  . Asthma    'I think I might have asthma"  . Chronic back pain   . Dyspnea    chronic  . Sarcoidosis     Patient Active Problem List   Diagnosis Date Noted  . Shoulder pain, left 10/23/2016  . Other chest pain 08/22/2014  . Upper airway cough syndrome 07/23/2014  . Sarcoidosis 03/06/2011    History reviewed. No pertinent surgical history.      Home Medications    Prior to Admission medications   Medication Sig Start Date End Date Taking? Authorizing Provider  famotidine (PEPCID) 20 MG tablet One at bedtime Patient not taking: Reported on 06/08/2017 03/26/17   Tanda Rockers, MD  ibuprofen (ADVIL,MOTRIN) 800 MG tablet Take  1 tablet (800 mg total) by mouth 3 (three) times daily. 06/29/17   Muthersbaugh, Jarrett Soho, PA-C  meloxicam (MOBIC) 15 MG tablet Take 15 mg by mouth daily.    [provider]  metroNIDAZOLE (METROCREAM) 0.75 % cream Apply 1 application topically 2 (two) times daily.    [provider]  penicillin v potassium (VEETID) 500 MG tablet Take 2 tablets (1,000 mg total) by mouth 2 (two) times daily. X 7 days 06/29/17   Muthersbaugh, Jarrett Soho, PA-C    Family History Family History  Problem Relation Age of Onset  . Heart disease Maternal Grandfather   . Heart disease Paternal Uncle   . Prostate cancer Paternal Uncle   . Hypertension Mother   . Diabetes Mother   . Heart attack Father   . Diabetes Father   . Hypertension Father     Social History Social History   Tobacco Use  . Smoking status: Former Smoker    Packs/day: 1.00    Years: 20.00    Pack years: 20.00    Types: Cigarettes    Last attempt to quit: 01/13/2011    Years since quitting: 6.5  . Smokeless tobacco: Never Used  Substance Use Topics  . Alcohol use: No    Alcohol/week: 0.0 oz  . Drug use: No     Allergies   Percocet [oxycodone-acetaminophen] and Vicodin [hydrocodone-acetaminophen]  Review of Systems Review of Systems  All other systems reviewed and are negative.    Physical Exam Updated Vital Signs BP (!) 124/94 (BP Location: Left Arm)   Pulse 82   Temp 98.7 F (37.1 C) (Oral)   Resp 16   SpO2 97%   Physical Exam  Constitutional: He appears well-developed and well-nourished.  HENT:  Head: Normocephalic and atraumatic.  Right Ear: External ear normal.  Left Ear: External ear normal.  Eyes: Conjunctivae are normal. Right eye exhibits no discharge. Left eye exhibits no discharge. No scleral icterus.  Cardiovascular:  Pulses:      Radial pulses are 2+ on the right side, and 2+ on the left side.       Dorsalis pedis pulses are 2+ on the right side, and 2+ on the left side.        Posterior tibial pulses are 2+ on the right side, and 2+ on the left side.  Pulmonary/Chest: Effort normal. No respiratory distress.  Musculoskeletal:       Right ankle: Normal.       Right foot: There is bony tenderness. There is normal range of motion, no swelling, normal capillary refill, no crepitus, no deformity and no laceration.       Feet:  Right hand: No gross deformities, skin intact. Fingers appear normal. No TTP over flexor sheath. TTP over base of 1st MCP, and PIP of 2nd - 4th digits. No snuffbox TTP. Finger adduction/abduction intact with 5/5 strength.  Thumb opposition intact. Full active and resisted ROM to flexion/extension at wrist, MCP, PIP and DIP of all fingers.  FDS/FDP intact. Radial artery 2+ with <2sec cap refill. SILT in M/U/R distributions.  Grip 5/5 strength. Positive Finkelstein test.   Neurological: He is alert. He has normal strength. No sensory deficit.  Skin: Skin is warm, dry and intact. Capillary refill takes less than 2 seconds. No erythema. No pallor.  Skin intact.  No joint swelling, overlying erythema or heat.  Psychiatric: He has a normal mood and affect.  Nursing note and vitals reviewed.    ED Treatments / Results  Labs (all labs ordered are listed, but only abnormal results are displayed) Labs Reviewed - No data to display  EKG None  Radiology Dg Wrist Complete Right  Result Date: 08/06/2017 CLINICAL DATA:  Right wrist pain. EXAM: RIGHT WRIST - COMPLETE 3+ VIEW COMPARISON:  None. 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 Bones of the wrist otherwise appear normal. No appreciable effusion. No arthritis. IMPRESSION: No acute abnormality. Congenital fusion of the lunate and triquetrum. Electronically Signed   By: Lorriane Shire M.D.   On: 08/06/2017 09:09   Dg Hand Complete Right  Result Date: 08/06/2017 CLINICAL DATA:  Right hand pain. EXAM: RIGHT HAND - COMPLETE 3+ VIEW COMPARISON:  None. FINDINGS: No fracture or  dislocation. Congenital fusion of the lunate and triquetrum. No arthritic changes. Soft tissues appear normal. IMPRESSION: No acute abnormality. Congenital fusion of the lunate and triquetrum. Electronically Signed   By: Lorriane Shire M.D.   On: 08/06/2017 09:05   Dg Foot Complete Right  Result Date: 08/06/2017 CLINICAL DATA:  Right foot pain.  Blunt trauma 2 weeks ago. EXAM: RIGHT FOOT COMPLETE - 3+ VIEW COMPARISON:  None. FINDINGS: There is cortical disruption of the distal phalanx of the little toe. The other bones of the foot are intact. Slight hallux valgus deformity with bunion formation. No other abnormalities. IMPRESSION: Small focal cortical fracture of the distal  phalanx of the little toe. Hallux valgus with bunion formation. Electronically Signed   By: Lorriane Shire M.D.   On: 08/06/2017 09:08    Procedures Procedures (including critical care time)  Medications Ordered in ED Medications  ibuprofen (ADVIL,MOTRIN) tablet 600 mg (600 mg Oral Given 08/06/17 2119)     Initial Impression / Assessment and Plan / ED Course  I have reviewed the triage vital signs and the nursing notes.  Pertinent labs & imaging results that were available during my care of the patient were reviewed by me and considered in my medical decision making (see chart for details).     47 year old male presenting here for right dominant hand pain as well as right fifth toe pain.  He notes that he has had right hand pain since his regular job.  I have no concern for septic joint.  Patient is afebrile without joint swelling, overlying erythema or decreased range of motion. He is NVI. No tendon injury. Question of de Quervain's tenosynovitis given positive Finkelstein's test as well as pain at the base of the first MCP extending into the wrist.  Will obtain x-rays.  Patient also with right fifth toe pain after dropping something on it a few weeks ago.  He notes pain with walking.  No open wounds.  He is neurovascular  intact.  Right hand and wrist x-rays negative for fracture.  There is congenital abnormality noted.  Do not think is causing patient's pain.  Will place in thumb spica and have patient follow-up with specialist versus PCP.  Advise Price therapy.  Patient with noted fracture of the fifth toe.  Will place in postop shoe and have patient follow-up.  I advised the patient to follow-up with PCP vs orthopedist this week. Specific return precautions discussed. Time was given for all questions to be answered. The patient verbalized understanding and agreement with plan. The patient appears safe for discharge home.  Final Clinical Impressions(s) / ED Diagnoses   Final diagnoses:  Right hand pain  Closed fracture of phalanx of right fifth toe, initial encounter    ED Discharge Orders        Ordered    naproxen (NAPROSYN) 500 MG tablet  2 times daily     08/06/17 1038       Jillyn Ledger, Hershal Coria 08/06/17 1039    Mackuen, Fredia Sorrow, MD 08/06/17 1553

## 2017-08-06 NOTE — Progress Notes (Signed)
Orthopedic Tech Progress Note Patient Details:  Nathan [name] 1971/01/31 [id number]  Ortho Devices Type of Ortho Device: CAM walker, Thumb velcro splint Ortho Device/Splint Interventions: Adjustment, Application   Post Interventions Patient Tolerated: Well Instructions Provided: Care of device   [name] 08/06/2017, 9:59 AM

## 2017-12-10 ENCOUNTER — Ambulatory Visit: Payer: Self-pay | Admitting: Internal Medicine

## 2018-01-17 ENCOUNTER — Telehealth: Payer: Self-pay | Admitting: Internal Medicine

## 2018-01-17 NOTE — Telephone Encounter (Signed)
lmtcb for pt. I do not see any documentation of records received.

## 2018-01-18 NOTE — Telephone Encounter (Signed)
Attempted to contact pt. I did not receive an answer. I have left a message for pt to return our call.  

## 2018-01-21 NOTE — Telephone Encounter (Signed)
Attempted to contact pt. I did not receive an answer. I have left a message for pt to return our call.  

## 2018-01-22 NOTE — Telephone Encounter (Signed)
We have attempted to contact pt several times with no success or call back from pt. Per triage protocol, message will be closed.  

## 2018-03-13 ENCOUNTER — Emergency Department (HOSPITAL_COMMUNITY): Payer: Self-pay

## 2018-03-13 ENCOUNTER — Encounter (HOSPITAL_COMMUNITY): Payer: Self-pay | Admitting: Emergency Medicine

## 2018-03-13 ENCOUNTER — Emergency Department (HOSPITAL_COMMUNITY)
Admission: EM | Admit: 2018-03-13 | Discharge: 2018-03-13 | Disposition: A | Discharge status: Home / Self Care | Payer: Self-pay | Attending: Emergency Medicine | Admitting: Emergency Medicine

## 2018-03-13 DIAGNOSIS — Z79899 Other long term (current) drug therapy: Secondary | ICD-10-CM | POA: Insufficient documentation

## 2018-03-13 DIAGNOSIS — Z7982 Long term (current) use of aspirin: Secondary | ICD-10-CM | POA: Insufficient documentation

## 2018-03-13 DIAGNOSIS — Z87891 Personal history of nicotine dependence: Secondary | ICD-10-CM | POA: Insufficient documentation

## 2018-03-13 DIAGNOSIS — I10 Essential (primary) hypertension: Secondary | ICD-10-CM | POA: Insufficient documentation

## 2018-03-13 DIAGNOSIS — R079 Chest pain, unspecified: Secondary | ICD-10-CM | POA: Insufficient documentation

## 2018-03-13 LAB — BASIC METABOLIC PANEL
Anion gap: 5 (ref 5–15)
BUN: 16 mg/dL (ref 6–20)
CO2: 27 mmol/L (ref 22–32)
Calcium: 8.9 mg/dL (ref 8.9–10.3)
Chloride: 108 mmol/L (ref 98–111)
Creatinine, Ser: 0.68 mg/dL (ref 0.61–1.24)
GFR calc Af Amer: >60 mL/min (ref >60)
GFR calc non Af Amer: >60 mL/min (ref >60)
Glucose, Bld: 125 mg/dL — ABNORMAL HIGH (ref 70–99)
Potassium: 4 mmol/L (ref 3.5–5.1)
Sodium: 140 mmol/L (ref 135–145)

## 2018-03-13 LAB — CBC
HCT: 40.6 % (ref 39.0–52.0)
Hemoglobin: 13 g/dL (ref 13.0–17.0)
MCH: 27.4 pg (ref 26.0–34.0)
MCHC: 32 g/dL (ref 30.0–36.0)
MCV: 85.7 fL (ref 80.0–100.0)
Platelets: 240 10*3/uL (ref 150–400)
RBC: 4.74 MIL/uL (ref 4.22–5.81)
RDW: 13.4 % (ref 11.5–15.5)
WBC: 5.5 10*3/uL (ref 4.0–10.5)
nRBC: 0 % (ref 0.0–0.2)

## 2018-03-13 LAB — I-STAT TROPONIN, ED: Troponin i, poc: 0 ng/mL (ref 0.00–0.08)

## 2018-03-13 MED ORDER — SODIUM CHLORIDE 0.9% FLUSH: 3.0000 mL | Freq: Once | INTRAVENOUS | Status: DC

## 2018-03-13 NOTE — Discharge Instructions (Signed)
Follow-up with your doctor.  Your blood pressure was elevated here also.  It will need following.

## 2018-03-13 NOTE — ED Provider Notes (Signed)
[facility] DEPT Provider Note   CSN: [id number] Arrival date & time: 03/13/18  9390     History   Chief Complaint Chief Complaint  Patient presents with  . Chest Pain    HPI Nathan [name] is a 48 y.o. male.  HPI Patient presents with chest pain.  Has had all of it off around a month now but is actually had episodes that prior.  States he has some chronic back pain and bulging disks.  States it is in his mid back and goes around to his chest at times.  He is wondering if the back pain could cause a chest pain.  States he woke up this morning with more pain in the chest.  It is dull.  Began after drinking some water.  No fevers or chills.  No cough.  No abdominal pain.  No swelling of his legs.  States he will sometimes get this at work.  Unclear if it comes on with exertion but states he does get it pretty much every day.  No cardiac history.  Does have questionable history of sarcoid.  States he gets shortness of breath also.  No cough.  No sputum production. Past Medical History:  Diagnosis Date  . Asthma    'I think I might have asthma"  . Chronic back pain   . Dyspnea    chronic  . Sarcoidosis     Patient Active Problem List   Diagnosis Date Noted  . Shoulder pain, left 10/23/2016  . Other chest pain 08/22/2014  . Upper airway cough syndrome 07/23/2014  . Sarcoidosis 03/06/2011    History reviewed. No pertinent surgical history.      Home Medications    Prior to Admission medications   Medication Sig Start Date End Date Taking? Authorizing Provider  albuterol (PROVENTIL HFA;VENTOLIN HFA) 108 (90 Base) MCG/ACT inhaler Inhale 2 puffs into the lungs every 6 (six) hours as needed for wheezing or shortness of breath.   Yes [provider]  aspirin 81 MG chewable tablet Chew 81 mg by mouth daily.   Yes [provider]  montelukast (SINGULAIR) 10 MG tablet Take 10 mg by mouth every evening. 02/21/18  Yes [provider]  famotidine (PEPCID) 20 MG tablet One at bedtime Patient not taking: Reported on 03/13/2018 03/26/17   Tanda Rockers, MD  ibuprofen (ADVIL,MOTRIN) 800 MG tablet Take 1 tablet (800 mg total) by mouth 3 (three) times daily. Patient not taking: Reported on 03/13/2018 06/29/17   Muthersbaugh, Jarrett Soho, PA-C  naproxen (NAPROSYN) 500 MG tablet Take 1 tablet (500 mg total) by mouth 2 (two) times daily. Patient not taking: Reported on 03/13/2018 08/06/17   Jillyn Ledger, PA-C  penicillin v potassium (VEETID) 500 MG tablet Take 2 tablets (1,000 mg total) by mouth 2 (two) times daily. X 7 days Patient not taking: Reported on 03/13/2018 06/29/17   Muthersbaugh, Jarrett Soho, PA-C    Family History Family History  Problem Relation Age of Onset  . Heart disease Maternal Grandfather   . Heart disease Paternal Uncle   . Prostate cancer Paternal Uncle   . Hypertension Mother   . Diabetes Mother   . Heart attack Father   . Diabetes Father   . Hypertension Father     Social History Social History   Tobacco Use  . Smoking status: Former Smoker    Packs/day: 1.00    Years: 20.00    Pack years: 20.00    Types: Cigarettes  Last attempt to quit: 01/13/2011    Years since quitting: 7.1  . Smokeless tobacco: Never Used  Substance Use Topics  . Alcohol use: No    Alcohol/week: 0.0 standard drinks  . Drug use: No     Allergies   Percocet [oxycodone-acetaminophen] and Vicodin [hydrocodone-acetaminophen]   Review of Systems Review of Systems  Constitutional: Negative for appetite change and fever.  HENT: Negative for congestion.   Respiratory: Positive for shortness of breath.   Cardiovascular: Positive for chest pain. Negative for leg swelling.  Gastrointestinal: Negative for abdominal pain.  Genitourinary: Negative for flank pain.  Musculoskeletal: Positive for back pain and neck pain.  Skin: Negative for rash.  Neurological: Negative for weakness.  Hematological: Negative for  adenopathy.  Psychiatric/Behavioral: Negative for confusion.     Physical Exam Updated Vital Signs BP (!) 168/102 (BP Location: Left Arm)   Pulse 77   Temp 99 F (37.2 C) (Oral)   Resp (!) 97   Ht 5' 7.5" (1.715 m)   Wt 105.7 kg   SpO2 97%   BMI 35.95 kg/m   Physical Exam Vitals signs and nursing note reviewed.  HENT:     Head: Atraumatic.  Eyes:     Pupils: Pupils are equal, round, and reactive to light.  Neck:     Musculoskeletal: Neck supple.  Cardiovascular:     Rate and Rhythm: Normal rate and regular rhythm.  Pulmonary:     Breath sounds: No wheezing or rhonchi.     Comments: Mild tenderness to the anterior chest.  No rash. Chest:     Chest wall: Tenderness present.  Abdominal:     Tenderness: There is no abdominal tenderness.  Musculoskeletal:     Right lower leg: No edema.     Left lower leg: No edema.  Skin:    General: Skin is warm.  Neurological:     General: No focal deficit present.     Mental Status: He is alert.  Psychiatric:        Mood and Affect: Mood normal.      ED Treatments / Results  Labs (all labs ordered are listed, but only abnormal results are displayed) Labs Reviewed  BASIC METABOLIC PANEL - Abnormal; Notable for the following components:      Result Value   Glucose, Bld 125 (*)    All other components within normal limits  CBC  I-STAT TROPONIN, ED    EKG EKG Interpretation  Date/Time:  Wednesday March 13 2018 07:08:06 EST Ventricular Rate:  77 PR Interval:    QRS Duration: 101 QT Interval:  381 QTC Calculation: 432 R Axis:   21 Text Interpretation:  Sinus rhythm Baseline wander in lead(s) I II aVR Confirmed by Davonna Belling [id number]) on 03/13/2018 7:57:24 AM   Radiology Dg Chest 2 View  Result Date: 03/13/2018 CLINICAL DATA:  Chest pain. EXAM: CHEST - 2 VIEW COMPARISON:  02/26/2017 and chest CT dated 02/12/2017 FINDINGS: The heart size is normal. Slight chronic prominence of the main pulmonary arteries.  Chronic elevation of the right hemidiaphragm. Slight chronic atelectasis at the right lung base. On the lateral view there is evidence of hilar adenopathy. IMPRESSION: No acute abnormality. Hilar and mediastinal adenopathy consistent with sarcoidosis as demonstrated on the prior CT scan. Chronic elevation of the right hemidiaphragm with slight atelectasis. Electronically Signed   By: Lorriane Shire M.D.   On: 03/13/2018 08:04    Procedures Procedures (including critical care time)  Medications Ordered in ED Medications  sodium chloride flush (NS) 0.9 % injection 3 mL (0 mLs Intravenous Hold 03/13/18 0715)     Initial Impression / Assessment and Plan / ED Course  I have reviewed the triage vital signs and the nursing notes.  Pertinent labs & imaging results that were available during my care of the patient were reviewed by me and considered in my medical decision making (see chart for details).    Patient with chest pain.  Dull.  Has had at least for last month and more.  Improved a little with inhaler at home also has felt more gassy.  EKG reassuring.  Enzymes negative.  X-ray reassuring.  CT scan still shows a lymphadenopathy.  Not pleuritic.  I think reasonable for discharge home.  Follow-up as an outpatient.  Final Clinical Impressions(s) / ED Diagnoses   Final diagnoses:  Nonspecific chest pain  Hypertension, unspecified type    ED Discharge Orders    None       Davonna Belling, MD 03/13/18 0930

## 2018-03-13 NOTE — ED Triage Notes (Signed)
Pt c/o intermittent central chest pains for "while" (over month).  Reports this morning he had the central chest tightness and used inhaler and helped relieved. It. Pt reports that he has lots of gas. Also has back pains and reports that he has asked his PCP if the back pains could cause the chest pains.

## 2018-03-13 NOTE — ED Triage Notes (Signed)
Pt adds that he takes ASA 81mg  daily.

## 2018-11-07 ENCOUNTER — Encounter (HOSPITAL_COMMUNITY): Payer: Self-pay | Admitting: Emergency Medicine

## 2018-11-07 ENCOUNTER — Emergency Department (HOSPITAL_COMMUNITY)
Admission: EM | Admit: 2018-11-07 | Discharge: 2018-11-07 | Disposition: A | Discharge status: Home / Self Care | Payer: Self-pay | Attending: Emergency Medicine | Admitting: Emergency Medicine

## 2018-11-07 DIAGNOSIS — Z87891 Personal history of nicotine dependence: Secondary | ICD-10-CM | POA: Insufficient documentation

## 2018-11-07 DIAGNOSIS — Z7982 Long term (current) use of aspirin: Secondary | ICD-10-CM | POA: Insufficient documentation

## 2018-11-07 DIAGNOSIS — J45909 Unspecified asthma, uncomplicated: Secondary | ICD-10-CM | POA: Insufficient documentation

## 2018-11-07 DIAGNOSIS — M62830 Muscle spasm of back: Secondary | ICD-10-CM | POA: Insufficient documentation

## 2018-11-07 MED ORDER — METHOCARBAMOL 500 MG PO TABS: 500.0000 mg | ORAL_TABLET | Freq: Two times a day (BID) | ORAL | 0 refills | Status: DC

## 2018-11-07 MED ORDER — NAPROXEN 500 MG PO TABS: 500.0000 mg | ORAL_TABLET | Freq: Two times a day (BID) | ORAL | 0 refills | Status: DC

## 2018-11-07 NOTE — Discharge Instructions (Signed)
Please take medications as prescribed It is recommended to take the muscle relaxer (Robaxin) only at nighttime to help you sleep or if you will be at home for an extended period of time. 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  You may take Naproxen as needed for the pain during the day Please follow up with [facility] given they were the ones who saw you for your back in 2019 Return to the ED for any worsening symptoms including worsening pain, numbness in your groin, difficulty urinating, peeing or pooping on yourself.

## 2018-11-07 NOTE — ED Provider Notes (Signed)
[facility] EMERGENCY DEPARTMENT Provider Note   CSN: [id number] Arrival date & time: 11/07/18  1117     History   Chief Complaint Chief Complaint  Patient presents with  . Spasms    HPI Nathan [name] is a 48 y.o. male with PMHx sarcoidosis, asthma, chronic back pain presents to the ED complaining of diffuse back spasms worse in his right trapezius times "a while."  Patient states that he was diagnosed with multiple pressure disks sometime in 2019 after having MRI.  He states that the orthopedist was discussing surgery with him but he was concerned that it would not improve his symptoms so he never went back to see them.  He states that his symptoms are intermittent and most recently started again yesterday.  He states he has taken medications in the past eluding muscle relaxers for it but cannot recall the names.  States these medications did not help him.  Denies fever, chills, saddle anesthesia, urinary retention, urinary or bowel incontinence, weakness or numbness in lower extremities, paresthesias, any other associated symptoms.  No history of IV drug use.  No recent spinal manipulation.        Past Medical History:  Diagnosis Date  . Asthma    'I think I might have asthma"  . Chronic back pain   . Dyspnea    chronic  . Sarcoidosis     Patient Active Problem List   Diagnosis Date Noted  . Shoulder pain, left 10/23/2016  . Other chest pain 08/22/2014  . Upper airway cough syndrome 07/23/2014  . Sarcoidosis 03/06/2011    History reviewed. No pertinent surgical history.      Home Medications    Prior to Admission medications   Medication Sig Start Date End Date Taking? Authorizing Provider  albuterol (PROVENTIL HFA;VENTOLIN HFA) 108 (90 Base) MCG/ACT inhaler Inhale 2 puffs into the lungs every 6 (six) hours as needed for wheezing or shortness of breath.    [provider]  aspirin 81 MG chewable tablet Chew 81 mg by mouth daily.     [provider]  famotidine (PEPCID) 20 MG tablet One at bedtime Patient not taking: Reported on 03/13/2018 03/26/17   Tanda Rockers, MD  methocarbamol (ROBAXIN) 500 MG tablet Take 1 tablet (500 mg total) by mouth 2 (two) times daily. 11/07/18   Uliana Brinker, PA-C  montelukast (SINGULAIR) 10 MG tablet Take 10 mg by mouth every evening. 02/21/18   [provider]  naproxen (NAPROSYN) 500 MG tablet Take 1 tablet (500 mg total) by mouth 2 (two) times daily. Patient not taking: Reported on 03/13/2018 08/06/17   Maczis, Barth Kirks, PA-C  naproxen (NAPROSYN) 500 MG tablet Take 1 tablet (500 mg total) by mouth 2 (two) times daily. 11/07/18   Eustaquio Maize, PA-C    Family History Family History  Problem Relation Age of Onset  . Heart disease Maternal Grandfather   . Heart disease Paternal Uncle   . Prostate cancer Paternal Uncle   . Hypertension Mother   . Diabetes Mother   . Heart attack Father   . Diabetes Father   . Hypertension Father     Social History Social History   Tobacco Use  . Smoking status: Former Smoker    Packs/day: 1.00    Years: 20.00    Pack years: 20.00    Types: Cigarettes    Quit date: 01/13/2011    Years since quitting: 7.8  . Smokeless tobacco: Never Used  Substance  Use Topics  . Alcohol use: No    Alcohol/week: 0.0 standard drinks  . Drug use: No     Allergies   Percocet [oxycodone-acetaminophen] and Vicodin [hydrocodone-acetaminophen]   Review of Systems Review of Systems  Constitutional: Negative for chills and fever.  Genitourinary: Negative for difficulty urinating.  Musculoskeletal: Positive for back pain. Negative for myalgias.  Neurological: Negative for weakness and numbness.     Physical Exam Updated Vital Signs BP 127/88   Pulse 85   Temp 98.7 F (37.1 C) (Oral)   Resp 16   SpO2 95%   Physical Exam Vitals signs and nursing note reviewed.  Constitutional:      Appearance: He is not ill-appearing.  HENT:      Head: Normocephalic and atraumatic.  Eyes:     Conjunctiva/sclera: Conjunctivae normal.  Neck:     Musculoskeletal: Normal range of motion and neck supple.  Cardiovascular:     Rate and Rhythm: Normal rate and regular rhythm.  Pulmonary:     Effort: Pulmonary effort is normal.     Breath sounds: Normal breath sounds.  Abdominal:     Palpations: Abdomen is soft.     Tenderness: There is no abdominal tenderness.  Musculoskeletal:     Comments: No C, T, L midline spinal tenderness.  Positive right sided cervical paraspinal tenderness along trapezius.  Range of motion intact to neck and back.  No difficulties with range of motion of his right shoulder.  Strength 5 out of 5 in bilateral upper extremities and bilateral lower extremities.  Sensation intact throughout.  2+ radial and DP pulses.  Skin:    General: Skin is warm and dry.  Neurological:     Mental Status: He is alert.      ED Treatments / Results  Labs (all labs ordered are listed, but only abnormal results are displayed) Labs Reviewed - No data to display  EKG None  Radiology No results found.  Procedures Procedures (including critical care time)  Medications Ordered in ED Medications - No data to display   Initial Impression / Assessment and Plan / ED Course  I have reviewed the triage vital signs and the nursing notes.  Pertinent labs & imaging results that were available during my care of the patient were reviewed by me and considered in my medical decision making (see chart for details).    48 year old male with chronic back pain who presents to the ED complaining of diffuse muscle spasms to his back mostly in the right trapezius area times a while.  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  Unable to see MRI in chart review or care everywhere.  Recent trauma to his back.  No IV drug use.  No red flags to suggest cauda equina.  No paresthesias to suggest cervical radiculopathy.  To motion  intact throughout back and neck.  Patient able to ambulate without difficulty.  He states he has taken muscle relaxers in the past but cannot recall the name.  Otherwise he is not taking anything for his pain.  He has not followed up again with orthopedic surgery regarding his ruptured disks as they offered surgery but he was not interested.  Feel the patient would benefit from following up with them again.  Will prescribe Robaxin and Naproxen at this time given spasms noted to trapezius muscle on right.  Return precautions have been discussed with patient.  He is in agreement with plan at this time and stable for discharge  home.   This note was prepared using Dragon voice recognition software and may include unintentional dictation errors due to the inherent limitations of voice recognition software.       Final Clinical Impressions(s) / ED Diagnoses   Final diagnoses:  Muscle spasm of back    ED Discharge Orders         Ordered    methocarbamol (ROBAXIN) 500 MG tablet  2 times daily     11/07/18 1306    naproxen (NAPROSYN) 500 MG tablet  2 times daily     11/07/18 1306           Eustaquio Maize, PA-C 11/07/18 1308    Lennice Sites, DO 11/07/18 1541

## 2018-11-07 NOTE — ED Triage Notes (Signed)
Pt states he has chronic back pain in his upper back with diagnosed with rupture discs. By his orthopedic. Pt states when he woke up this am his back was having spasms and also felt like he was having aching his arms. No numbness or tingling. Pt is ambulatory.

## 2018-11-14 ENCOUNTER — Other Ambulatory Visit: Payer: Self-pay

## 2018-11-14 ENCOUNTER — Emergency Department (HOSPITAL_COMMUNITY)
Admission: EM | Admit: 2018-11-14 | Discharge: 2018-11-14 | Disposition: A | Discharge status: Home / Self Care | Payer: Self-pay | Attending: Emergency Medicine | Admitting: Emergency Medicine

## 2018-11-14 ENCOUNTER — Encounter (HOSPITAL_COMMUNITY): Payer: Self-pay | Admitting: Emergency Medicine

## 2018-11-14 DIAGNOSIS — Z7982 Long term (current) use of aspirin: Secondary | ICD-10-CM | POA: Insufficient documentation

## 2018-11-14 DIAGNOSIS — Z87891 Personal history of nicotine dependence: Secondary | ICD-10-CM | POA: Insufficient documentation

## 2018-11-14 DIAGNOSIS — J45909 Unspecified asthma, uncomplicated: Secondary | ICD-10-CM | POA: Insufficient documentation

## 2018-11-14 DIAGNOSIS — L308 Other specified dermatitis: Secondary | ICD-10-CM | POA: Insufficient documentation

## 2018-11-14 MED ORDER — ERYTHROMYCIN 2 % EX GEL: Freq: Two times a day (BID) | CUTANEOUS | 0 refills | Status: DC

## 2018-11-14 NOTE — ED Notes (Signed)
Patient verbalizes understanding of discharge instructions. Opportunity for questioning and answers were provided. Armband removed by staff, pt discharged from ED.  

## 2018-11-14 NOTE — ED Triage Notes (Signed)
Pt reports rash around eyes and left nostril. Rash has been there for more than a few months. Denies vision changes.

## 2018-11-14 NOTE — Discharge Instructions (Addendum)
Please try Zyrtec once daily for allergies Use Erythromycin gel twice daily on rash Follow up with your dermatologist

## 2018-11-14 NOTE — ED Provider Notes (Signed)
[facility] EMERGENCY DEPARTMENT Provider Note   CSN: [id number] Arrival date & time: 11/14/18  [id number]     History   Chief Complaint Chief Complaint  Patient presents with  . Rash    HPI Nathan [name] is a 48 y.o. male who presents with a rash. PMH significant for possible Sarcoidosis, chronic cough and dyspnea, chronic back pain. He states that he has had a rash around his right nostril for over a year. Over the past week he has developed a new rash around the corner of his R eye and little around his L eye. The area is itchy but not particularly painful. He was diagnosed with possible Sarcoid by Dr. Melvyn Novas and was recommended to have a skin biopsy of the area which he did over a year ago. The biopsy returned as a "post-granulomatous dermatitis" according to chart review. He was on Prednisone for a prolonged period (about 3 months) and he states this never improved the rash. 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 He denies fever, eye pain or vision changes, sneezing. He does report a lot of issues with discharge from the eyes bilaterally.     HPI  Past Medical History:  Diagnosis Date  . Asthma    'I think I might have asthma"  . Chronic back pain   . Dyspnea    chronic  . Sarcoidosis     Patient Active Problem List   Diagnosis Date Noted  . Shoulder pain, left 10/23/2016  . Other chest pain 08/22/2014  . Upper airway cough syndrome 07/23/2014  . Sarcoidosis 03/06/2011    History reviewed. No pertinent surgical history.      Home Medications    Prior to Admission medications   Medication Sig Start Date End Date Taking? Authorizing Provider  albuterol (PROVENTIL HFA;VENTOLIN HFA) 108 (90 Base) MCG/ACT inhaler Inhale 2 puffs into the lungs every 6 (six) hours as needed for wheezing or shortness of breath.    [provider]  aspirin 81 MG  chewable tablet Chew 81 mg by mouth daily.    [provider]  famotidine (PEPCID) 20 MG tablet One at bedtime Patient not taking: Reported on 03/13/2018 03/26/17   Tanda Rockers, MD  methocarbamol (ROBAXIN) 500 MG tablet Take 1 tablet (500 mg total) by mouth 2 (two) times daily. 11/07/18   Venter, Margaux, PA-C  montelukast (SINGULAIR) 10 MG tablet Take 10 mg by mouth every evening. 02/21/18   [provider]  naproxen (NAPROSYN) 500 MG tablet Take 1 tablet (500 mg total) by mouth 2 (two) times daily. Patient not taking: Reported on 03/13/2018 08/06/17   Maczis, Barth Kirks, PA-C  naproxen (NAPROSYN) 500 MG tablet Take 1 tablet (500 mg total) by mouth 2 (two) times daily. 11/07/18   Eustaquio Maize, PA-C    Family History Family History  Problem Relation Age of Onset  . Heart disease Maternal Grandfather   . Heart disease Paternal Uncle   . Prostate cancer Paternal Uncle   . Hypertension Mother   . Diabetes Mother   . Heart attack Father   . Diabetes Father   . Hypertension Father     Social History Social History   Tobacco Use  . Smoking status: Former Smoker    Packs/day: 1.00    Years: 20.00    Pack years: 20.00    Types:  Cigarettes    Quit date: 01/13/2011    Years since quitting: 7.8  . Smokeless tobacco: Never Used  Substance Use Topics  . Alcohol use: No    Alcohol/week: 0.0 standard drinks  . Drug use: No     Allergies   Percocet [oxycodone-acetaminophen] and Vicodin [hydrocodone-acetaminophen]   Review of Systems Review of Systems  Constitutional: Negative for fever.  Eyes: Negative for pain.  Skin: Positive for rash.     Physical Exam Updated Vital Signs BP 128/89 (BP Location: Right Arm)   Pulse 67   Temp 98.5 F (36.9 C)   Resp 18   Ht 5\' 7"  (1.702 m)   Wt 104.3 kg   SpO2 99%   BMI 36.02 kg/m   Physical Exam Vitals signs and nursing note reviewed.  Constitutional:      General: He is not in acute distress.    Appearance:  Normal appearance. He is well-developed. He is not ill-appearing.  HENT:     Head: Normocephalic and atraumatic.  Eyes:     General: No scleral icterus.       Right eye: No discharge.        Left eye: No discharge.     Conjunctiva/sclera: Conjunctivae normal.     Pupils: Pupils are equal, round, and reactive to light.  Neck:     Musculoskeletal: Normal range of motion.  Cardiovascular:     Rate and Rhythm: Normal rate.  Pulmonary:     Effort: Pulmonary effort is normal. No respiratory distress.  Abdominal:     General: There is no distension.  Skin:    General: Skin is warm and dry.     Findings: Rash (reddish-purple papular rash around the exterior R nostril and on the skin just medial to the R eye) present.  Neurological:     Mental Status: He is alert and oriented to person, place, and time.  Psychiatric:        Behavior: Behavior normal.      ED Treatments / Results  Labs (all labs ordered are listed, but only abnormal results are displayed) Labs Reviewed - No data to display  EKG None  Radiology No results found.  Procedures Procedures (including critical care time)  Medications Ordered in ED Medications - No data to display   Initial Impression / Assessment and Plan / ED Course  I have reviewed the triage vital signs and the nursing notes.  Pertinent labs & imaging results that were available during my care of the patient were reviewed by me and considered in my medical decision making (see chart for details).  48 year old male presents with chronic facial rash. 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 His vitals are normal and he is well appearing. Steroid cream may make this worse therefore will try Erythromycin ointment for one month. He was advised that the rash is difficult to treat and he should follow up with his dermatologist.  Final Clinical Impressions(s) / ED Diagnoses   Final diagnoses:   Interstitial granulomatous dermatitis    ED Discharge Orders    None       Recardo Evangelist, PA-C 11/14/18 1016    Davonna Belling, MD 11/14/18 1551

## 2019-03-28 ENCOUNTER — Other Ambulatory Visit: Payer: Self-pay | Admitting: Family Medicine

## 2019-03-28 DIAGNOSIS — R0602 Shortness of breath: Secondary | ICD-10-CM

## 2019-03-28 DIAGNOSIS — M25511 Pain in right shoulder: Secondary | ICD-10-CM

## 2019-03-28 DIAGNOSIS — D869 Sarcoidosis, unspecified: Secondary | ICD-10-CM

## 2019-03-31 ENCOUNTER — Other Ambulatory Visit: Payer: Self-pay

## 2019-03-31 ENCOUNTER — Ambulatory Visit
Admission: RE | Admit: 2019-03-31 | Discharge: 2019-03-31 | Disposition: A | Discharge status: Home / Self Care | Payer: No Typology Code available for payment source | Source: Ambulatory Visit | Attending: Family Medicine | Admitting: Family Medicine

## 2019-03-31 DIAGNOSIS — D869 Sarcoidosis, unspecified: Secondary | ICD-10-CM

## 2019-03-31 DIAGNOSIS — R0602 Shortness of breath: Secondary | ICD-10-CM

## 2019-03-31 DIAGNOSIS — M25511 Pain in right shoulder: Secondary | ICD-10-CM

## 2019-05-07 ENCOUNTER — Encounter (HOSPITAL_COMMUNITY): Payer: Self-pay | Admitting: Emergency Medicine

## 2019-05-07 ENCOUNTER — Other Ambulatory Visit: Payer: Self-pay

## 2019-05-07 ENCOUNTER — Emergency Department (HOSPITAL_COMMUNITY)
Admission: EM | Admit: 2019-05-07 | Discharge: 2019-05-07 | Disposition: A | Discharge status: Home / Self Care | Payer: PRIVATE HEALTH INSURANCE | Attending: Emergency Medicine | Admitting: Emergency Medicine

## 2019-05-07 DIAGNOSIS — J45909 Unspecified asthma, uncomplicated: Secondary | ICD-10-CM | POA: Insufficient documentation

## 2019-05-07 DIAGNOSIS — M546 Pain in thoracic spine: Secondary | ICD-10-CM | POA: Insufficient documentation

## 2019-05-07 DIAGNOSIS — Z87891 Personal history of nicotine dependence: Secondary | ICD-10-CM | POA: Diagnosis not present

## 2019-05-07 DIAGNOSIS — G8929 Other chronic pain: Secondary | ICD-10-CM | POA: Diagnosis not present

## 2019-05-07 DIAGNOSIS — Z79899 Other long term (current) drug therapy: Secondary | ICD-10-CM | POA: Insufficient documentation

## 2019-05-07 DIAGNOSIS — Z7982 Long term (current) use of aspirin: Secondary | ICD-10-CM | POA: Insufficient documentation

## 2019-05-07 DIAGNOSIS — M549 Dorsalgia, unspecified: Secondary | ICD-10-CM | POA: Diagnosis present

## 2019-05-07 MED ORDER — METHOCARBAMOL 500 MG PO TABS: 500.0000 mg | ORAL_TABLET | Freq: Two times a day (BID) | ORAL | 0 refills | Status: DC

## 2019-05-07 MED ORDER — METHOCARBAMOL 500 MG PO TABS
500.0000 mg | ORAL_TABLET | Freq: Once | ORAL | Status: AC
  Administered 2019-05-07: 02:00:00 500 mg via ORAL
  Filled 2019-05-07: qty 1

## 2019-05-07 MED ORDER — MELOXICAM 7.5 MG PO TABS: 15.0000 mg | ORAL_TABLET | Freq: Every day | ORAL | 0 refills | Status: DC

## 2019-05-07 MED ORDER — KETOROLAC TROMETHAMINE 60 MG/2ML IM SOLN
60.0000 mg | Freq: Once | INTRAMUSCULAR | Status: AC
  Administered 2019-05-07: 60 mg via INTRAMUSCULAR
  Filled 2019-05-07: qty 2

## 2019-05-07 NOTE — ED Triage Notes (Addendum)
Patient brought in by [facility] from home. Patient is complaining of back pain. Patient walked to EMS truck without problems.

## 2019-05-07 NOTE — ED Provider Notes (Signed)
[name]   CSN: [id number] Arrival date & time: 05/07/19  0105     History Chief Complaint  Patient presents with  . Back Pain    Nathan [name] is a 49 y.o. male.  The history is provided by the patient and medical records.  Back Pain   49 year old male with history of sarcoidosis, chronic back pain, degenerative disc disease, presenting to the ED with mid back pain.  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  States for the past few days his back has "been on fire".  States it is a burning type pain with a deep, intense ache.  This is worse with exertional activity or heavy lifting.  He has been seen by his PCP for this and was given Flexeril, however states he can only take this once daily before bed due to drowsiness.  He has not had any change in character of his pain, and just remains uncontrolled.  He denies any numbness or weakness of his arms or legs.  No bowel or bladder incontinence.  Past Medical History:  Diagnosis Date  . Asthma    'I think I might have asthma"  . Chronic back pain   . Dyspnea    chronic  . Sarcoidosis     Patient Active Problem List   Diagnosis Date Noted  . Shoulder pain, left 10/23/2016  . Other chest pain 08/22/2014  . Upper airway cough syndrome 07/23/2014  . Sarcoidosis 03/06/2011    History reviewed. No pertinent surgical history.     Family History  Problem Relation Age of Onset  . Heart disease Maternal Grandfather   . Heart disease Paternal Uncle   . Prostate cancer Paternal Uncle   . Hypertension Mother   . Diabetes Mother   . Heart attack Father   . Diabetes Father   . Hypertension Father     Social History   Tobacco Use  . Smoking status: Former Smoker    Packs/day: 1.00    Years: 20.00    Pack years: 20.00    Types: Cigarettes    Quit date: 01/13/2011    Years since quitting: 8.3  . Smokeless  tobacco: Never Used  Substance Use Topics  . Alcohol use: No    Alcohol/week: 0.0 standard drinks  . Drug use: No    Home Medications Prior to Admission medications   Medication Sig Start Date End Date Taking? Authorizing Provider  albuterol (PROVENTIL HFA;VENTOLIN HFA) 108 (90 Base) MCG/ACT inhaler Inhale 2 puffs into the lungs every 6 (six) hours as needed for wheezing or shortness of breath.    [provider]  aspirin 81 MG chewable tablet Chew 81 mg by mouth daily.    [provider]  erythromycin with ethanol (EMGEL) 2 % gel Apply topically 2 (two) times daily. 11/14/18   Recardo Evangelist, PA-C  methocarbamol (ROBAXIN) 500 MG tablet Take 1 tablet (500 mg total) by mouth 2 (two) times daily. 11/07/18   Venter, Margaux, PA-C  montelukast (SINGULAIR) 10 MG tablet Take 10 mg by mouth every evening. 02/21/18   [provider]  naproxen (NAPROSYN) 500 MG tablet Take 1 tablet (500 mg total) by mouth 2 (two) times daily. 11/07/18   Eustaquio Maize, PA-C  famotidine (PEPCID) 20 MG tablet One at bedtime Patient not taking: Reported on 03/13/2018 03/26/17 11/14/18  Tanda Rockers, MD    Allergies  Percocet [oxycodone-acetaminophen] and Vicodin [hydrocodone-acetaminophen]  Review of Systems   Review of Systems  Musculoskeletal: Positive for back pain.  All other systems reviewed and are negative.   Physical Exam Updated Vital Signs BP (!) 162/89 (BP Location: Left Arm)   Pulse 91   Temp 98.6 F (37 C) (Oral)   Resp 16   Ht 5\' 7"  (1.702 m)   Wt 108.9 kg   SpO2 96%   BMI 37.59 kg/m   Physical Exam Vitals and nursing note reviewed.  Constitutional:      Appearance: He is well-developed.  HENT:     Head: Normocephalic and atraumatic.  Eyes:     Conjunctiva/sclera: Conjunctivae normal.     Pupils: Pupils are equal, round, and reactive to light.  Cardiovascular:     Rate and Rhythm: Normal rate and regular rhythm.     Heart sounds: Normal heart sounds.   Pulmonary:     Effort: Pulmonary effort is normal. No respiratory distress.     Breath sounds: Normal breath sounds. No rhonchi.  Abdominal:     General: Bowel sounds are normal.     Palpations: Abdomen is soft.     Tenderness: There is no abdominal tenderness. There is no rebound.  Musculoskeletal:        General: Normal range of motion.     Cervical back: Normal range of motion.     Comments: Endorses aching type pain of the thoracic spine between the shoulder blades, there is no focal tenderness, deformity, or step-off, normal strength and sensation of arms and legs, normal gait  Skin:    General: Skin is warm and dry.  Neurological:     Mental Status: He is alert and oriented to person, place, and time.     ED Results / Procedures / Treatments   Labs (all labs ordered are listed, but only abnormal results are displayed) Labs Reviewed - No data to display  EKG None  Radiology No results found.  Procedures Procedures (including critical care time)  Medications Ordered in ED Medications  ketorolac (TORADOL) injection 60 mg (60 mg Intramuscular Given 05/07/19 0224)  methocarbamol (ROBAXIN) tablet 500 mg (500 mg Oral Given 05/07/19 0224)    ED Course  I have reviewed the triage vital signs and the nursing notes.  Pertinent labs & imaging results that were available during my care of the patient were reviewed by me and considered in my medical decision making (see chart for details).    MDM Rules/Calculators/A&P  49 year old male here with mid back pain.  This is a chronic issue for him secondary to DDD and pain is unchanged.  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  He is afebrile and nontoxic in appearance here.  His neurologic exam is nonfocal.  He is not have any deformities of the spine on exam.  No red flag symptoms.  Patient was treated here with Toradol and Robaxin.  He was prescribed Flexeril from PCP,  however has had quite a bit of drowsiness from this.  We will switch him to meloxicam and Robaxin at home.  Work note provided.  He will need to follow-up closely with his primary care doctor.  He may return here for any new or acute changes.  Final Clinical Impression(s) / ED Diagnoses Final diagnoses:  Chronic bilateral thoracic back pain    Rx / DC Orders ED Discharge Orders  Ordered    methocarbamol (ROBAXIN) 500 MG tablet  2 times daily     05/07/19 0218    meloxicam (MOBIC) 7.5 MG tablet  Daily     05/07/19 0218           Larene Pickett, PA-C 05/07/19 0244    Orpah Greek, MD 05/07/19 [phone]

## 2019-05-07 NOTE — Discharge Instructions (Signed)
Take the prescribed medication as directed. °Follow-up with your primary care doctor. °Return to the ED for new or worsening symptoms. °

## 2019-07-18 ENCOUNTER — Encounter: Payer: Self-pay | Admitting: Gastroenterology

## 2019-07-18 ENCOUNTER — Encounter: Payer: Self-pay | Admitting: Physical Medicine & Rehabilitation

## 2019-07-23 ENCOUNTER — Telehealth: Payer: Self-pay | Admitting: Internal Medicine

## 2019-07-23 NOTE — Telephone Encounter (Signed)
Will await Dr. Byrum response.  

## 2019-07-23 NOTE — Telephone Encounter (Signed)
Dr. Lamonte Sakai pt would like to switch care to you. Can we make an appt?  Tibes MW? You saw him back in 2019 so he is still a patient.

## 2019-07-23 NOTE — Telephone Encounter (Signed)
Spoke with pt regarding a referral that came in for an appt request - Pt is wanting to switch providers to Dr. Lamonte Sakai. If you could, please reach out to pt and set up a NP (30 minute) appt with Dr. Lamonte Sakai  for evaluation and treatment of Sarcoidosis if both providers are agreeable. Pt states that if he can't be reached, we may reach out to his wife April to make an appt or leave a message with her (DPR on file) .Thank you!

## 2019-07-23 NOTE — Telephone Encounter (Signed)
Fine with me

## 2019-07-24 NOTE — Telephone Encounter (Signed)
Yes ok with me

## 2019-07-24 NOTE — Telephone Encounter (Signed)
Spoke with pt. He has been scheduled to see RB on 08/27/2019 at 1615. Nothing further was needed.

## 2019-08-07 ENCOUNTER — Ambulatory Visit: Payer: PRIVATE HEALTH INSURANCE | Admitting: Physical Medicine & Rehabilitation

## 2019-08-22 ENCOUNTER — Telehealth: Payer: Self-pay | Admitting: Emergency Medicine

## 2019-08-22 NOTE — Telephone Encounter (Signed)
ATC pt, no answer. Left message for pt to call back.  

## 2019-08-26 ENCOUNTER — Encounter
Payer: PRIVATE HEALTH INSURANCE | Attending: Physical Medicine & Rehabilitation | Admitting: Physical Medicine & Rehabilitation

## 2019-08-26 NOTE — Telephone Encounter (Signed)
I do not see any documentation where anyone from our office called the pt. LMTCB x2 for pt.

## 2019-08-27 ENCOUNTER — Ambulatory Visit (INDEPENDENT_AMBULATORY_CARE_PROVIDER_SITE_OTHER): Payer: 59 | Admitting: Emergency Medicine

## 2019-08-27 ENCOUNTER — Encounter: Payer: Self-pay | Admitting: Emergency Medicine

## 2019-08-27 ENCOUNTER — Other Ambulatory Visit: Payer: Self-pay

## 2019-08-27 DIAGNOSIS — D869 Sarcoidosis, unspecified: Secondary | ICD-10-CM

## 2019-08-27 MED ORDER — ALBUTEROL SULFATE HFA 108 (90 BASE) MCG/ACT IN AERS: 2.0000 | INHALATION_SPRAY | Freq: Four times a day (QID) | RESPIRATORY_TRACT | 3 refills | Status: DC | PRN

## 2019-08-27 NOTE — Telephone Encounter (Signed)
Pt has an appointment today with RB.

## 2019-08-27 NOTE — Progress Notes (Signed)
Subjective:    Patient ID: Nathan [name], male    DOB: 08/03/1970, 49 y.o.   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  Treated with steroids intermittently in the past, not on currently. He did have a pred taper over a month ago from his PCP  He has baseline dyspnea. Has trouble with stairs, with walking an extended distance. Has not had any wheezing, but has heard some in the past. Has some chest and back tightness at times. He has albuterol, uses it a few times a day.   Pulmonary function testing from 6/19 reviewed by me showed grossly normal airflows with decreased function after albuterol, normal lung volumes and normal diffusion capacity.  CT chest 02/12/2017 reviewed by me, shows mediastinal bilateral hilar lymphadenopathy with some subpleural blebbing and scarring in both upper lobes, right lower lobe.  There was a stable 9 mm medial left lower lobe pulmonary nodule.   Review of Systems As per HPI  Past Medical History:  Diagnosis Date  . Asthma    'I think I might have asthma"  . Chronic back pain   . Dyspnea    chronic  . Sarcoidosis      Family History  Problem Relation Age of Onset  . Heart disease Maternal Grandfather   . Heart disease Paternal Uncle   . Prostate cancer Paternal Uncle   . Hypertension Mother   . Diabetes Mother   . Heart attack Father   . Diabetes Father   . Hypertension Father      Social History   Socioeconomic History  . Marital status: Married    Spouse name: Not on file  . Number of children: 2  . Years of education: Not on file  . Highest education level: Not on file  Occupational History  . Occupation: [employer]  Tobacco Use  . Smoking status: Former Smoker    Packs/day: 1.00    Years: 20.00    Pack years: 20.00    Types:  Cigarettes    Quit date: 01/13/2011    Years since quitting: 8.6  . Smokeless tobacco: Never Used  Vaping Use  . Vaping Use: Never used  Substance and Sexual Activity  . Alcohol use: No    Alcohol/week: 0.0 standard drinks  . Drug use: No  . Sexual activity: Yes    Birth control/protection: Condom  Other Topics Concern  . Not on file  Social History Narrative  . Not on file   Social Determinants of Health   Financial Resource Strain:   . Difficulty of Paying Living Expenses:   Food Insecurity:   . Worried About Charity fundraiser in the Last Year:   . Arboriculturist in the Last Year:   Transportation Needs:   . Film/video editor (Medical):   Marland Kitchen Lack of Transportation (Non-Medical):   Physical Activity:   . Days of Exercise per Week:   . Minutes of Exercise per Session:   Stress:   . Feeling of Stress :   Social Connections:   . Frequency of Communication with Friends and Family:   . Frequency of Social Gatherings with Friends and Family:   . Attends Religious Services:   . Active Member of Clubs or Organizations:   . Attends Archivist Meetings:   .  Marital Status:   Intimate Partner Violence:   . Fear of Current or Ex-Partner:   . Emotionally Abused:   Marland Kitchen Physically Abused:   . Sexually Abused:     Has worked in [employer]. Has worked in [place] No inhaled exposures except dusts.  No military [place] native   Allergies  Allergen Reactions  . Percocet [Oxycodone-Acetaminophen] Itching    Able to take tylenol  . Vicodin [Hydrocodone-Acetaminophen] Itching     Outpatient Medications Prior to Visit  Medication Sig Dispense Refill  . aspirin 81 MG chewable tablet Chew 81 mg by mouth daily.    . famotidine (PEPCID) 20 MG tablet Take 20 mg by mouth 2 (two) times daily.    Marland Kitchen ibuprofen (ADVIL) 800 MG tablet Take 800 mg by mouth every 8 (eight) hours as needed.    . rosuvastatin (CRESTOR) 5 MG tablet Take 5 mg by mouth  daily.    . Vitamin D, Ergocalciferol, (DRISDOL) 1.25 MG (50000 UNIT) CAPS capsule Take 50,000 Units by mouth every 7 (seven) days.    Marland Kitchen albuterol (PROVENTIL HFA;VENTOLIN HFA) 108 (90 Base) MCG/ACT inhaler Inhale 2 puffs into the lungs every 6 (six) hours as needed for wheezing or shortness of breath.    . erythromycin with ethanol (EMGEL) 2 % gel Apply topically 2 (two) times daily. (Patient not taking: Reported on 08/27/2019) 60 g 0  . meloxicam (MOBIC) 7.5 MG tablet Take 2 tablets (15 mg total) by mouth daily. 30 tablet 0  . methocarbamol (ROBAXIN) 500 MG tablet Take 1 tablet (500 mg total) by mouth 2 (two) times daily. 20 tablet 0  . montelukast (SINGULAIR) 10 MG tablet Take 10 mg by mouth every evening.    . naproxen (NAPROSYN) 500 MG tablet Take 1 tablet (500 mg total) by mouth 2 (two) times daily. 30 tablet 0   No facility-administered medications prior to visit.         Objective:   Physical Exam Vitals:   08/27/19 1600  BP: 118/72  Pulse: 74  Temp: 98.3 F (36.8 C)  TempSrc: Oral  SpO2: 98%  Weight: 239 lb 12.8 oz (108.8 kg)  Height: '5\' 8"'  (1.727 m)   Gen: Pleasant, well-nourished, in no distress,  normal affect  ENT: No lesions,  mouth clear,  oropharynx clear, no postnasal drip  Neck: No JVD, no stridor  Lungs: No use of accessory muscles, no crackles or wheezing on normal respiration, no wheeze on forced expiration  Cardiovascular: RRR, heart sounds normal, no murmur or gallops, no peripheral edema  Musculoskeletal: No deformities, no cyanosis or clubbing  Neuro: alert, awake, non focal  Skin: Warm, dark raised skin inner aspect of each orbit, no nasal lesions seen       Assessment & Plan:  Sarcoidosis Clinical graphical diagnosis of sarcoidosis.  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  He has not had surveillance PFT or imaging recently and it is  time to do these things.  He is on albuterol and has progressive dyspnea.  If we document obstruction then we can talk about starting scheduled bronchodilator therapy.  I will check an ACE level, ESR, PFT and CT chest.  We will follow up to review.  I explained to him that sarcoidosis is ultimately a tissue diagnosis and that we could consider EBUS, lymph nodal biopsy going forward if we feel that is indicated.  Baltazar Apo,  MD, PhD 08/27/2019, 5:00 PM [facility] [phone] or if no answer [phone]

## 2019-08-27 NOTE — Patient Instructions (Addendum)
Lab work today We will repeat your CT scan of the chest to compare with priors We will repeat your pulmonary function testing Keep your albuterol available use 2 puffs up to every 4 hours if needed for shortness of breath, chest tightness, wheezing. Follow with Dr. Lamonte Sakai next available with full pulmonary function testing on the same day.

## 2019-08-27 NOTE — Assessment & Plan Note (Signed)
Clinical graphical diagnosis of sarcoidosis.  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  He has not had surveillance PFT or imaging recently and it is time to do these things.  He is on albuterol and has progressive dyspnea.  If we document obstruction then we can talk about starting scheduled bronchodilator therapy.  I will check an ACE level, ESR, PFT and CT chest.  We will follow up to review.  I explained to him that sarcoidosis is ultimately a tissue diagnosis and that we could consider EBUS, lymph nodal biopsy going forward if we feel that is indicated.

## 2019-08-28 LAB — SEDIMENTATION RATE: Sed Rate: 8 mm/hr (ref 0–15)

## 2019-08-29 LAB — ANGIOTENSIN CONVERTING ENZYME: Angiotensin-Converting Enzyme: 21 U/L (ref 9–67)

## 2019-09-05 ENCOUNTER — Ambulatory Visit (AMBULATORY_SURGERY_CENTER): Payer: Self-pay

## 2019-09-05 ENCOUNTER — Other Ambulatory Visit: Payer: Self-pay

## 2019-09-05 VITALS — Ht 68.0 in | Wt 237.0 lb

## 2019-09-05 DIAGNOSIS — Z01818 Encounter for other preprocedural examination: Secondary | ICD-10-CM

## 2019-09-05 DIAGNOSIS — Z1211 Encounter for screening for malignant neoplasm of colon: Secondary | ICD-10-CM

## 2019-09-05 NOTE — Progress Notes (Signed)
No egg or soy allergy known to patient  No issues with past sedation with any surgeries or procedures no intubation problems in the past  No diet pills per patient No home 02 use per patient  No blood thinners per patient  Pt denies issues with constipation  No A fib or A flutter  EMMI video to pt or MyChart  COVID 19 guidelines implemented in [facility] today   Due to the COVID-19 pandemic we are asking patients to follow these guidelines. Please only bring one care partner. Please be aware that your care partner may wait in the car in the parking lot or if they feel like they will be too hot to wait in the car, they may wait in the lobby on the 4th floor. All care partners are required to wear a mask the entire time (we do not have any that we can provide them), they need to practice social distancing, and we will do a Covid check for all patient's and care partners when you arrive. Also we will check their temperature and your temperature. If the care partner waits in their car they need to stay in the parking lot the entire time and we will call them on their cell phone when the patient is ready for discharge so they can bring the car to the front of the building. Also all patient's will need to wear a mask into building.  

## 2019-09-15 ENCOUNTER — Other Ambulatory Visit: Payer: Self-pay

## 2019-09-15 ENCOUNTER — Ambulatory Visit (INDEPENDENT_AMBULATORY_CARE_PROVIDER_SITE_OTHER)
Admission: RE | Admit: 2019-09-15 | Discharge: 2019-09-15 | Disposition: A | Discharge status: Home / Self Care | Payer: PRIVATE HEALTH INSURANCE | Source: Ambulatory Visit | Attending: Emergency Medicine | Admitting: Emergency Medicine

## 2019-09-15 DIAGNOSIS — D869 Sarcoidosis, unspecified: Secondary | ICD-10-CM | POA: Diagnosis not present

## 2019-09-16 ENCOUNTER — Other Ambulatory Visit: Payer: Self-pay | Admitting: Gastroenterology

## 2019-09-16 ENCOUNTER — Ambulatory Visit (INDEPENDENT_AMBULATORY_CARE_PROVIDER_SITE_OTHER): Payer: PRIVATE HEALTH INSURANCE

## 2019-09-16 DIAGNOSIS — Z1159 Encounter for screening for other viral diseases: Secondary | ICD-10-CM

## 2019-09-16 LAB — SARS CORONAVIRUS 2 (TAT 6-24 HRS): SARS Coronavirus 2: NEGATIVE

## 2019-09-17 ENCOUNTER — Emergency Department (HOSPITAL_COMMUNITY): Payer: 59

## 2019-09-17 ENCOUNTER — Emergency Department (HOSPITAL_COMMUNITY)
Admission: EM | Admit: 2019-09-17 | Discharge: 2019-09-17 | Disposition: A | Discharge status: Home / Self Care | Payer: 59 | Attending: Emergency Medicine | Admitting: Emergency Medicine

## 2019-09-17 ENCOUNTER — Other Ambulatory Visit: Payer: Self-pay

## 2019-09-17 ENCOUNTER — Encounter (HOSPITAL_COMMUNITY): Payer: Self-pay | Admitting: Emergency Medicine

## 2019-09-17 DIAGNOSIS — R197 Diarrhea, unspecified: Secondary | ICD-10-CM | POA: Insufficient documentation

## 2019-09-17 DIAGNOSIS — R079 Chest pain, unspecified: Secondary | ICD-10-CM | POA: Insufficient documentation

## 2019-09-17 DIAGNOSIS — Z5321 Procedure and treatment not carried out due to patient leaving prior to being seen by health care provider: Secondary | ICD-10-CM | POA: Diagnosis not present

## 2019-09-17 DIAGNOSIS — R0602 Shortness of breath: Secondary | ICD-10-CM | POA: Insufficient documentation

## 2019-09-17 LAB — CBC
HCT: 41.4 % (ref 39.0–52.0)
Hemoglobin: 13.7 g/dL (ref 13.0–17.0)
MCH: 27.9 pg (ref 26.0–34.0)
MCHC: 33.1 g/dL (ref 30.0–36.0)
MCV: 84.3 fL (ref 80.0–100.0)
Platelets: 280 10*3/uL (ref 150–400)
RBC: 4.91 MIL/uL (ref 4.22–5.81)
RDW: 12.7 % (ref 11.5–15.5)
WBC: 5.6 10*3/uL (ref 4.0–10.5)
nRBC: 0 % (ref 0.0–0.2)

## 2019-09-17 LAB — BASIC METABOLIC PANEL
Anion gap: 11 (ref 5–15)
BUN: 10 mg/dL (ref 6–20)
CO2: 24 mmol/L (ref 22–32)
Calcium: 8.9 mg/dL (ref 8.9–10.3)
Chloride: 104 mmol/L (ref 98–111)
Creatinine, Ser: 0.7 mg/dL (ref 0.61–1.24)
GFR calc Af Amer: >60 mL/min (ref >60)
GFR calc non Af Amer: >60 mL/min (ref >60)
Glucose, Bld: 95 mg/dL (ref 70–99)
Potassium: 4 mmol/L (ref 3.5–5.1)
Sodium: 139 mmol/L (ref 135–145)

## 2019-09-17 LAB — LIPASE, BLOOD: Lipase: 24 U/L (ref 11–51)

## 2019-09-17 LAB — TROPONIN I (HIGH SENSITIVITY): Troponin I (High Sensitivity): 7 ng/L (ref <18)

## 2019-09-17 MED ORDER — SODIUM CHLORIDE 0.9% FLUSH: 3.0000 mL | Freq: Once | INTRAVENOUS | Status: DC

## 2019-09-17 NOTE — ED Triage Notes (Signed)
Pt c/o cp, SOB and diarrhea for the past few days. Denies any exposure to covid no vaccinated.

## 2019-09-17 NOTE — ED Notes (Signed)
PT ADVISED THAT HE WOULD BE LEAVING DUE TO WAIT TIME.

## 2019-09-18 ENCOUNTER — Telehealth: Payer: Self-pay | Admitting: *Deleted

## 2019-09-18 NOTE — Telephone Encounter (Signed)
Dr. Rush Landmark,  This pt is coming in tomorrow for a colonoscopy.  He was seen in the ED on 09-17-19 for chest pain but left before being treated. He had blood work done.  He states "I have a lot of back pain that radiates to my chest and I think that's what was going on."  He is having no chest discomfort or SOB now.   How would you like to proceed?  Thanks, [employer]

## 2019-09-18 NOTE — Telephone Encounter (Signed)
I spoke with Dr. Rush Landmark.  He is ok proceeding as scheduled- pt made aware.  He is told to call if he has any more chest pain or SOB; understanding voiced

## 2019-09-18 NOTE — Telephone Encounter (Signed)
Patient called in reference to the previous message is concerned that he if the provider says its ok to come in he wont be able to prep on time.

## 2019-09-18 NOTE — Telephone Encounter (Signed)
Based on previous work-up and negative troponins and patient's status such that he is not having issues and feels it is due to his back and has had cross-sectional imaging recently I think it is fine to move forward with planned procedure. Certainly he will be reevaluated by our anesthesia team tomorrow if any concerns we can decide finally at that point in time.  Justice Britain, MD [facility] Office # [phone]

## 2019-09-19 ENCOUNTER — Ambulatory Visit (AMBULATORY_SURGERY_CENTER): Payer: PRIVATE HEALTH INSURANCE | Admitting: Gastroenterology

## 2019-09-19 ENCOUNTER — Encounter: Payer: Self-pay | Admitting: Gastroenterology

## 2019-09-19 ENCOUNTER — Other Ambulatory Visit: Payer: Self-pay

## 2019-09-19 VITALS — BP 95/56 | HR 68 | Temp 96.9°F | Resp 16 | Ht 68.0 in | Wt 237.0 lb

## 2019-09-19 DIAGNOSIS — D124 Benign neoplasm of descending colon: Secondary | ICD-10-CM | POA: Diagnosis not present

## 2019-09-19 DIAGNOSIS — D123 Benign neoplasm of transverse colon: Secondary | ICD-10-CM

## 2019-09-19 DIAGNOSIS — Z1211 Encounter for screening for malignant neoplasm of colon: Secondary | ICD-10-CM | POA: Diagnosis not present

## 2019-09-19 DIAGNOSIS — D122 Benign neoplasm of ascending colon: Secondary | ICD-10-CM

## 2019-09-19 DIAGNOSIS — D125 Benign neoplasm of sigmoid colon: Secondary | ICD-10-CM

## 2019-09-19 MED ORDER — SODIUM CHLORIDE 0.9 % IV SOLN: 500.0000 mL | Freq: Once | INTRAVENOUS | Status: DC

## 2019-09-19 NOTE — Patient Instructions (Signed)
Read all of the handouts given to you by your recovery room nurse.  Thank-you for choosing [place] for your healthcare needs today.  You will need a repeat colonoscopy in 3-5 years.  YOU HAD AN ENDOSCOPIC PROCEDURE TODAY AT [facility]:   Refer to the procedure report that was given to you for any specific questions about what was found during the examination.  If the procedure report does not answer your questions, please call your gastroenterologist to clarify.  If you requested that your care partner not be given the details of your procedure findings, then the procedure report has been included in a sealed envelope for you to review at your convenience later.  YOU SHOULD EXPECT: Some feelings of bloating in the abdomen. Passage of more gas than usual.  Walking can help get rid of the air that was put into your GI tract during the procedure and reduce the bloating. If you had a lower endoscopy (such as a colonoscopy or flexible sigmoidoscopy) you may notice spotting of blood in your stool or on the toilet paper. If you underwent a bowel prep for your procedure, you may not have a normal bowel movement for a few days.  Please Note:  You might notice some irritation and congestion in your nose or some drainage.  This is from the oxygen used during your procedure.  There is no need for concern and it should clear up in a day or so.  SYMPTOMS TO REPORT IMMEDIATELY:   Following lower endoscopy (colonoscopy or flexible sigmoidoscopy):  Excessive amounts of blood in the stool  Significant tenderness or worsening of abdominal pains  Swelling of the abdomen that is new, acute  Fever of 100F or higher  For urgent or emergent issues, a gastroenterologist can be reached at any hour by calling [phone]. Do not use MyChart messaging for urgent concerns.    DIET:  We do recommend a small meal at first, but then you may proceed to your regular diet.  Drink plenty of fluids but you should  avoid alcoholic beverages for 24 hours. Try to increase the fiber in your diet, and drink plenty of water.  Use FiberCon as the Dr suggested.  ACTIVITY:  You should plan to take it easy for the rest of today and you should NOT DRIVE or use heavy machinery until tomorrow (because of the sedation medicines used during the test).    FOLLOW UP: Our staff will call the number listed on your records 48-72 hours following your procedure to check on you and address any questions or concerns that you may have regarding the information given to you following your procedure. If we do not reach you, we will leave a message.  We will attempt to reach you two times.  During this call, we will ask if you have developed any symptoms of COVID 19. If you develop any symptoms (ie: fever, flu-like symptoms, shortness of breath, cough etc.) before then, please call [phone].  If you test positive for Covid 19 in the 2 weeks post procedure, please call and report this information to [place].    If any biopsies were taken you will be contacted by phone or by letter within the next 1-3 weeks.  Please call us at [phone] if you have not heard about the biopsies in 3 weeks.    SIGNATURES/CONFIDENTIALITY: You and/or your care partner have signed paperwork which will be entered into your electronic medical record.  These signatures attest  to the fact that that the information above on your After Visit Summary has been reviewed and is understood.  Full responsibility of the confidentiality of this discharge information lies with you and/or your care-partner. 

## 2019-09-19 NOTE — Progress Notes (Signed)
A and O x3. Report to RN. Tolerated MAC anesthesia well.

## 2019-09-19 NOTE — Progress Notes (Signed)
Lidocaine buffer  robinol antisialogogue 

## 2019-09-19 NOTE — Progress Notes (Signed)
Called to room to assist during endoscopic procedure.  Patient ID and intended procedure confirmed with present staff. Received instructions for my participation in the procedure from the performing physician.  

## 2019-09-19 NOTE — Op Note (Signed)
[facility] Patient Name: Nathan [name] Procedure Date: 09/19/2019 10:13 AM MRN: [id number] Endoscopist: Justice Britain , MD Age: 49 Referring MD:  Date of Birth: [birth year] Gender: Male Account #: [id number] Procedure:                Colonoscopy Indications:              Screening for colorectal malignant neoplasm Medicines:                Monitored Anesthesia Care Procedure:                Pre-Anesthesia Assessment:                           - Prior to the procedure, a History and Physical                            was performed, and patient medications and                            allergies were reviewed. The patient's tolerance of                            previous anesthesia was also reviewed. The risks                            and benefits of the procedure and the sedation                            options and risks were discussed with the patient.                            All questions were answered, and informed consent                            was obtained. Prior Anticoagulants: The patient has                            taken no previous anticoagulant or antiplatelet                            agents except for aspirin. ASA Grade Assessment: II                            - A patient with mild systemic disease. After                            reviewing the risks and benefits, the patient was                            deemed in satisfactory condition to undergo the                            procedure.  After obtaining informed consent, the colonoscope                            was passed under direct vision. Throughout the                            procedure, the patient's blood pressure, pulse, and                            oxygen saturations were monitored continuously. The                            Colonoscope was introduced through the anus and                            advanced to the 5 cm into the ileum. The                             colonoscopy was performed without difficulty. The                            patient tolerated the procedure. The quality of the                            bowel preparation was adequate. The terminal ileum,                            ileocecal valve, appendiceal orifice, and rectum                            were photographed. Scope In: 10:39:24 AM Scope Out: 11:07:24 AM Scope Withdrawal Time: 0 hours 23 minutes 10 seconds  Total Procedure Duration: 0 hours 28 minutes 0 seconds  Findings:                 The digital rectal exam findings include                            hemorrhoids. Pertinent negatives include no                            palpable rectal lesions.                           The terminal ileum and ileocecal valve appeared                            normal.                           Four sessile polyps were found in the sigmoid colon                            (1), descending colon (1), transverse colon (1) and  ascending colon (1). The polyps were 3 to 6 mm in                            size. These polyps were removed with a cold snare.                            Resection and retrieval were complete.                           A few small-mouthed diverticula were found in the                            ascending colon.                           Normal mucosa was found in the entire colon                            otherwise.                           Non-bleeding non-thrombosed external and internal                            hemorrhoids were found during retroflexion, during                            perianal exam and during digital exam. The                            hemorrhoids were Grade II (internal hemorrhoids                            that prolapse but reduce spontaneously). Complications:            No immediate complications. Estimated Blood Loss:     Estimated blood loss was minimal. Impression:               -  Hemorrhoids found on digital rectal exam.                           - The examined portion of the ileum was normal.                           - Four, 3 to 6 mm polyps in the sigmoid colon, in                            the descending colon, in the transverse colon and                            in the ascending colon, removed with a cold snare.                            Resected and retrieved.                           -  Diverticulosis in the ascending colon.                           - Normal mucosa in the entire examined colon.                           - Non-bleeding non-thrombosed external and internal                            hemorrhoids. Recommendation:           - The patient will be observed post-procedure,                            until all discharge criteria are met.                           - Discharge patient to home.                           - Patient has a contact number available for                            emergencies. The signs and symptoms of potential                            delayed complications were discussed with the                            patient. Return to normal activities tomorrow.                            Written discharge instructions were provided to the                            patient.                           - High fiber diet.                           - Use FiberCon 1-2 tablets PO daily.                           - Continue present medications.                           - Await pathology results.                           - Repeat colonoscopy in likely 3 years for                            surveillance based on pathology results.                           - The findings and recommendations were discussed  with the patient.                           - The findings and recommendations were discussed                            with the patient's family. Justice Britain, MD 09/19/2019 11:18:16 AM

## 2019-09-19 NOTE — Progress Notes (Signed)
Pt's states no medical or surgical changes since previsit or office visit. 

## 2019-09-23 ENCOUNTER — Telehealth: Payer: Self-pay

## 2019-09-23 ENCOUNTER — Telehealth: Payer: Self-pay | Admitting: *Deleted

## 2019-09-23 NOTE — Telephone Encounter (Signed)
  Follow up Call-  Call back number 09/19/2019  Post procedure Call Back phone  # [phone]  Permission to leave phone message Yes  Some recent data might be hidden     Patient questions:  Do you have a fever, pain , or abdominal swelling? No. Pain Score  0 *  Have you tolerated food without any problems? Yes.    Have you been able to return to your normal activities? Yes.    Do you have any questions about your discharge instructions: Diet   No. Medications  No. Follow up visit  No.  Do you have questions or concerns about your Care? No.  Actions: * If pain score is 4 or above: No action needed, pain <4.  1. Have you developed a fever since your procedure? no  2.   Have you had an respiratory symptoms (SOB or cough) since your procedure? no  3.   Have you tested positive for COVID 19 since your procedure no  4.   Have you had any family members/close contacts diagnosed with the COVID 19 since your procedure?  no   If yes to any of these questions please route to Joylene John, RN and Joella Prince, RN

## 2019-09-23 NOTE — Telephone Encounter (Signed)
Left message on answering machine. 

## 2019-09-26 ENCOUNTER — Telehealth: Payer: Self-pay | Admitting: Emergency Medicine

## 2019-09-26 NOTE — Telephone Encounter (Signed)
Spoke with patient, states that the sob and chest tightness is not new, he has been having it for 2 years.  He has had his CT scan, it looks like Dr. Lamonte Sakai has not had a chance to review it.  Pt. Is scheduled for his pft on 9/7 as well as a f/u with Dr. Lamonte Sakai as well.  He requested to see a NP prior to his September appointment.  OV scheduled for 8/24 with Tammy at 10 am.  Advised to call us back if the sob or chest discomfort gets worse, starts coughing up anything of color.  Also advised to seek emergency care for any CP that radiates to his arm, neck, is accompanied by nausea or diphoresis.  He verbalized understanding.  Nothing further needed.

## 2019-10-03 ENCOUNTER — Other Ambulatory Visit: Payer: Self-pay

## 2019-10-03 ENCOUNTER — Other Ambulatory Visit: Payer: Self-pay | Admitting: Family Medicine

## 2019-10-03 ENCOUNTER — Ambulatory Visit
Admission: RE | Admit: 2019-10-03 | Discharge: 2019-10-03 | Disposition: A | Discharge status: Home / Self Care | Payer: PRIVATE HEALTH INSURANCE | Source: Ambulatory Visit | Attending: Family Medicine | Admitting: Family Medicine

## 2019-10-03 DIAGNOSIS — M542 Cervicalgia: Secondary | ICD-10-CM

## 2019-10-03 DIAGNOSIS — G8929 Other chronic pain: Secondary | ICD-10-CM

## 2019-10-03 DIAGNOSIS — M5440 Lumbago with sciatica, unspecified side: Secondary | ICD-10-CM

## 2019-10-05 ENCOUNTER — Encounter: Payer: Self-pay | Admitting: Gastroenterology

## 2019-10-07 ENCOUNTER — Ambulatory Visit: Payer: PRIVATE HEALTH INSURANCE | Admitting: Adult Health

## 2019-10-08 ENCOUNTER — Other Ambulatory Visit: Payer: Self-pay

## 2019-10-08 ENCOUNTER — Ambulatory Visit (HOSPITAL_COMMUNITY)
Admission: EM | Admit: 2019-10-08 | Discharge: 2019-10-08 | Disposition: A | Discharge status: Home / Self Care | Payer: 59 | Attending: Emergency Medicine | Admitting: Emergency Medicine

## 2019-10-08 ENCOUNTER — Ambulatory Visit (INDEPENDENT_AMBULATORY_CARE_PROVIDER_SITE_OTHER): Payer: 59

## 2019-10-08 ENCOUNTER — Telehealth: Payer: Self-pay | Admitting: Emergency Medicine

## 2019-10-08 ENCOUNTER — Encounter (HOSPITAL_COMMUNITY): Payer: Self-pay | Admitting: Emergency Medicine

## 2019-10-08 DIAGNOSIS — Z20822 Contact with and (suspected) exposure to covid-19: Secondary | ICD-10-CM | POA: Insufficient documentation

## 2019-10-08 DIAGNOSIS — R0602 Shortness of breath: Secondary | ICD-10-CM | POA: Diagnosis not present

## 2019-10-08 DIAGNOSIS — Z8709 Personal history of other diseases of the respiratory system: Secondary | ICD-10-CM | POA: Diagnosis not present

## 2019-10-08 DIAGNOSIS — J069 Acute upper respiratory infection, unspecified: Secondary | ICD-10-CM | POA: Diagnosis not present

## 2019-10-08 LAB — SARS CORONAVIRUS 2 (TAT 6-24 HRS): SARS Coronavirus 2: POSITIVE — AB

## 2019-10-08 MED ORDER — AEROCHAMBER PLUS MISC
2 refills | Status: DC
Start: 2019-10-08 — End: 2020-03-05

## 2019-10-08 MED ORDER — IBUPROFEN 600 MG PO TABS
600.0000 mg | ORAL_TABLET | Freq: Four times a day (QID) | ORAL | 0 refills | Status: DC | PRN
Start: 2019-10-08 — End: 2019-12-26

## 2019-10-08 NOTE — Telephone Encounter (Signed)
I spoke with the pt and notified of Dr Agustina Caroli response and he verbalized understanding  Nothing further needed at this time

## 2019-10-08 NOTE — Telephone Encounter (Signed)
Called and spoke with pt. Pt wanted to know if he had been diagnosed with sarcoidosis.   While speaking with pt, pt stated that he is currently on the way to Urgent Care off of [facility]. Pt stated that he woke up yesterday 8/24 having a sore throat, with a nonproductive cough, sneezing, body aches, and also a low grade fever of 99.6.  Pt denies any loss of taste or loss of smell.  Asked pt if he had received his covid vaccines and he stated that he has not.  Stated to pt that it is a good idea for him to go to Urgent Care to be evaluated and I also stated to him that he probably will be Covid tested at [facility] due to his symptoms and he verbalized understanding.  Routing to Dr. Lamonte Sakai in regards to pt's question of diagnosis of sarcoidosis.

## 2019-10-08 NOTE — Telephone Encounter (Signed)
As we discussed in office, he has x-rays that are consistent with sarcoidosis and he had granulomas on a skin biopsy that would suggest this diagnosis. He has never had any absolute proof of sarcoid because the dermatologist did not believe the skin biopsy was consistent with this diagnosis. His ACE level that was done when he was last seen by [place] was normal so this does not tell us the answer. In the end I believe it is very likely that he has sarcoidosis but we have not definitively proven with a biopsy so far.

## 2019-10-08 NOTE — ED Provider Notes (Signed)
HPI  SUBJECTIVE:  Nathan [name] is a 49 y.o. male who presents with mild scratchy sore throat, body aches, headaches, cough, wheezing starting yesterday. No fevers, nasal congestion, postnasal drip, loss of sense of smell or taste, nausea, vomiting, diarrhea, abdominal pain. He denies change in his baseline chest pain or shortness of breath. No known Covid exposure. He did not receive the Covid vaccine. No antibiotics in the past 3 months. Antipyretic in the past 6 hours. He has tried ibuprofen 600 mg with evaluation history of aggravating factors. He states that he is not needing his rescue albuterol inhaler more than usual. He has a past medical history of asthma, provisional diagnosis of sarcoidosis, hypercholesterolemia. No history of diabetes, hypertension, coronary disease, immunocompromise, chronic kidney disease, cancer. No current steroid use. IWL:NLGX, Elwin Sleight, MD Pulmonology: [facility] pulmonology. has follow-up on September 9.    Past Medical History:  Diagnosis Date  . Arthritis   . Asthma    'I think I might have asthma"  . Chronic back pain   . Dyspnea    chronic  . Hyperlipidemia   . Sarcoidosis     History reviewed. No pertinent surgical history.  Family History  Problem Relation Age of Onset  . Heart disease Maternal Grandfather   . Heart disease Paternal Uncle   . Prostate cancer Paternal Uncle   . Hypertension Mother   . Diabetes Mother   . Heart attack Father   . Diabetes Father   . Hypertension Father   . Colon polyps Sister   . Colon cancer Neg Hx   . Esophageal cancer Neg Hx   . Rectal cancer Neg Hx   . Stomach cancer Neg Hx     Social History   Tobacco Use  . Smoking status: Former Smoker    Packs/day: 1.00    Years: 20.00    Pack years: 20.00    Types: Cigarettes    Quit date: 01/13/2011    Years since quitting: 8.7  . Smokeless tobacco: Never Used  Vaping Use  . Vaping Use: Never used  Substance Use Topics  . Alcohol use: No     Alcohol/week: 0.0 standard drinks  . Drug use: No    No current facility-administered medications for this encounter.  Current Outpatient Medications:  .  albuterol (VENTOLIN HFA) 108 (90 Base) MCG/ACT inhaler, Inhale 2 puffs into the lungs every 6 (six) hours as needed for wheezing or shortness of breath., Disp: 18 g, Rfl: 3 .  aspirin 81 MG chewable tablet, Chew 81 mg by mouth daily., Disp: , Rfl:  .  ibuprofen (ADVIL) 600 MG tablet, Take 1 tablet (600 mg total) by mouth every 6 (six) hours as needed., Disp: 30 tablet, Rfl: 0 .  rosuvastatin (CRESTOR) 5 MG tablet, Take 5 mg by mouth daily., Disp: , Rfl:  .  Spacer/Aero-Holding Chambers (AEROCHAMBER PLUS) inhaler, Use as instructed, Disp: 1 each, Rfl: 2 .  Vitamin D, Ergocalciferol, (DRISDOL) 1.25 MG (50000 UNIT) CAPS capsule, Take 50,000 Units by mouth every 7 (seven) days. (Patient not taking: Reported on 09/05/2019), Disp: , Rfl:   Allergies  Allergen Reactions  . Percocet [Oxycodone-Acetaminophen] Itching    Able to take tylenol  . Vicodin [Hydrocodone-Acetaminophen] Itching     ROS  As noted in HPI.   Physical Exam  BP 137/84 (BP Location: Right Arm)   Pulse (!) 107   Temp 100.2 F (37.9 C) (Oral)   Resp 18   SpO2 94%   Constitutional: Well developed,  well nourished, no acute distress Eyes:  EOMI, conjunctiva normal bilaterally HENT: Normocephalic, atraumatic,mucus membranes moist.  No nasal congestion.  No maxillary, frontal sinus tenderness.  Normal oropharynx. Respiratory: Normal inspiratory effort, lungs clear bilaterally Cardiovascular: Regular tachycardia no murmurs rubs or gallops GI: nondistended skin: No rash, skin intact Musculoskeletal: no deformities Neurologic: Alert & oriented x 3, no focal neuro deficits Psychiatric: Speech and behavior appropriate   ED Course   Medications - No data to display  Orders Placed This Encounter  Procedures  . SARS CORONAVIRUS 2 (TAT 6-24 HRS) Nasopharyngeal  Nasopharyngeal Swab    Standing Status:   Standing    Number of Occurrences:   1    Order Specific Question:   Is this test for diagnosis or screening    Answer:   Screening    Order Specific Question:   Symptomatic for COVID-19 as defined by CDC    Answer:   No    Order Specific Question:   Hospitalized for COVID-19    Answer:   No    Order Specific Question:   Admitted to ICU for COVID-19    Answer:   No    Order Specific Question:   Previously tested for COVID-19    Answer:   Yes    Order Specific Question:   Resident in a congregate (group) care setting    Answer:   No    Order Specific Question:   Employed in healthcare setting    Answer:   No    Order Specific Question:   Has patient completed COVID vaccination(s) (2 doses of Pfizer/Moderna 1 dose of [employer])    Answer:   No  . DG Chest 2 View    Standing Status:   Standing    Number of Occurrences:   1    Order Specific Question:   Reason for Exam (SYMPTOM  OR DIAGNOSIS REQUIRED)    Answer:   History of sarcoidosis, rule out pneumonia, Covid pneumonia    No results found for this or any previous visit (from the past 56 hour(s)). DG Chest 2 View  Result Date: 10/08/2019 CLINICAL DATA:  History of sarcoidosis with shortness of breath EXAM: CHEST - 2 VIEW COMPARISON:  09/17/2019, CT from 09/15/2019 FINDINGS: Cardiac shadow is within normal limits. The lungs are well aerated bilaterally. Mild scarring is noted similar to that seen on prior CT examination. No sizable infiltrate or effusion is seen. No sizable parenchymal nodule is noted. IMPRESSION: Stable scarring similar to that seen on prior CT examination. No acute abnormality noted. Electronically Signed   By: Inez Catalina M.D.   On: 10/08/2019 17:47    ED Clinical Impression  1. Viral URI with cough   2. Encounter for laboratory testing for COVID-19 virus      ED Assessment/Plan  Patient has a low-grade fever, tachycardic, O2 sat 94%, baseline seems to be  96%.  Outside records, labs reviewed as noted in HPI.  Went to the ED for chest pain, shortness of breath and diarrhea on 8/4.  Covid negative, troponin, BNP, CBC negative.  Has an outpatient visit on 8/24 with pulmonology-states he missed it.  Lungs clear, but will get a chest x-ray to rule out lobar pneumonia.  Advised pulse oximeter.  Continue albuterol as needed. Suspect Covid.  Covid test sent.  He will be infusion candidate.  Reviewed imaging independently.  Stable scarring.  No change compared to previous. see radiology report for full details.  Home with  supportive treatment including ibuprofen/Tylenol, albuterol every 4 hours as needed.  Patient states does not need a prescription for this.  Discussed  imaging, MDM, treatment plan, and plan for follow-up with patient. Discussed sn/sx that should prompt return to the ED. patient agrees with plan.  Covid positive. Sent chat message to the infusion clinic for follow-up.  Meds ordered this encounter  Medications  . Spacer/Aero-Holding Chambers (AEROCHAMBER PLUS) inhaler    Sig: Use as instructed    Dispense:  1 each    Refill:  2  . ibuprofen (ADVIL) 600 MG tablet    Sig: Take 1 tablet (600 mg total) by mouth every 6 (six) hours as needed.    Dispense:  30 tablet    Refill:  0    *This clinic note was created using [employer]. Therefore, there may be occasional mistakes despite careful proofreading.   ?    Melynda Ripple, MD 10/10/19 [phone]

## 2019-10-08 NOTE — ED Triage Notes (Signed)
Pt presents to [facility] for assessment of sneezing, cough, and scratchy throat since yesterday.

## 2019-10-08 NOTE — Discharge Instructions (Addendum)
Covid test will be back in 6 to 24 hours.  Someone will contact you if it is positive.  You will be a candidate for monoclonal antibody infusion.  Chest x-ray hadsno new findings on it.  You may take 600 mg of ibuprofen combined with 1000 mg Tylenol together 3-4 times a day as needed for pain.  Get a pulse oximeter and keep an eye on your pulse ox.  I am giving you a spacer which will make your albuterol more effective.  If you already have 1, you do not need to fill this prescription.

## 2019-10-17 ENCOUNTER — Telehealth: Payer: Self-pay | Admitting: Emergency Medicine

## 2019-10-17 NOTE — Telephone Encounter (Signed)
Spoke with patient regarding prior message . Patient that he tested positive for Covid on 8/26 and wanted to know if he can still keep his office visit for 9/7 with RB. Advised patient he is fine for he f/u has to be 10 days for symptoms. Advised patient if symptoms get worse before next appt that he school cancel . Patient's voice was understanding nothing else further needed.

## 2019-10-18 ENCOUNTER — Other Ambulatory Visit (HOSPITAL_COMMUNITY): Payer: PRIVATE HEALTH INSURANCE

## 2019-10-19 ENCOUNTER — Other Ambulatory Visit: Payer: Self-pay

## 2019-10-19 ENCOUNTER — Ambulatory Visit (HOSPITAL_COMMUNITY)
Admission: EM | Admit: 2019-10-19 | Discharge: 2019-10-19 | Disposition: A | Discharge status: Home / Self Care | Payer: PRIVATE HEALTH INSURANCE | Attending: Emergency Medicine | Admitting: Emergency Medicine

## 2019-10-19 ENCOUNTER — Encounter (HOSPITAL_COMMUNITY): Payer: Self-pay | Admitting: Gynecology

## 2019-10-19 DIAGNOSIS — U071 COVID-19: Secondary | ICD-10-CM

## 2019-10-19 DIAGNOSIS — R0981 Nasal congestion: Secondary | ICD-10-CM

## 2019-10-19 MED ORDER — PREDNISONE 10 MG (21) PO TBPK: ORAL_TABLET | Freq: Every day | ORAL | 0 refills | Status: DC

## 2019-10-19 MED ORDER — IPRATROPIUM BROMIDE 0.06 % NA SOLN: 2.0000 | Freq: Four times a day (QID) | NASAL | 0 refills | Status: DC | PRN

## 2019-10-19 MED ORDER — AEROCHAMBER PLUS FLO-VU SMALL MISC
1.0000 | Freq: Once | Status: AC
  Administered 2019-10-19: 1

## 2019-10-19 MED ORDER — AEROCHAMBER PLUS FLO-VU LARGE MISC
Status: AC
  Filled 2019-10-19: qty 1

## 2019-10-19 NOTE — ED Triage Notes (Signed)
Nasal congestion. Per patient was previously positive for covid.

## 2019-10-19 NOTE — Discharge Instructions (Signed)
Prednisone course.  Push fluids to ensure adequate hydration and keep secretions thin.  Tylenol and/or ibuprofen as needed for pain or fevers.  Nasal spray up to 4 times a day as needed for nasal congestion.  If symptoms worsen or do not improve in the next week to return to be seen or to follow up with your PCP.

## 2019-10-19 NOTE — ED Provider Notes (Signed)
Nathan [name]    CSN: [id number] Arrival date & time: 10/19/19  1619      History   Chief Complaint Chief Complaint  Patient presents with   Nasal Congestion    HPI Nathan [name] is a 49 y.o. male.   Nathan [name] presents with complaints of persistent congestion. Frontal facial congestion. Productive cough. Chest tightness, back pain. Diagnosed with covid-19 8/26. He feels like congestion hasn't worsened but hasn't improved. Sometimes feels shortness of breath. Infrequent cough. Symptoms started a few days after diagnosed with covid. Has been taking [employer] cold/flu day and night. Helps with cough. Also taking immunity supplementation. No fevers. Wife and daughter at home also with covid-19.     ROS per HPI, negative if not otherwise mentioned.      Past Medical History:  Diagnosis Date   Arthritis    Asthma    'I think I might have asthma"   Chronic back pain    Dyspnea    chronic   Hyperlipidemia    Sarcoidosis     Patient Active Problem List   Diagnosis Date Noted   Shoulder pain, left 10/23/2016   Other chest pain 08/22/2014   Upper airway cough syndrome 07/23/2014   Sarcoidosis 03/06/2011    History reviewed. No pertinent surgical history.     Home Medications    Prior to Admission medications   Medication Sig Start Date End Date Taking? Authorizing Provider  albuterol (VENTOLIN HFA) 108 (90 Base) MCG/ACT inhaler Inhale 2 puffs into the lungs every 6 (six) hours as needed for wheezing or shortness of breath. 08/27/19  Yes Collene Gobble, MD  aspirin 81 MG chewable tablet Chew 81 mg by mouth daily.   Yes [provider]  ibuprofen (ADVIL) 600 MG tablet Take 1 tablet (600 mg total) by mouth every 6 (six) hours as needed. 10/08/19  Yes Melynda Ripple, MD  rosuvastatin (CRESTOR) 5 MG tablet Take 5 mg by mouth daily.   Yes [provider]  ipratropium (ATROVENT) 0.06 % nasal spray Place 2 sprays into  both nostrils 4 (four) times daily as needed for rhinitis. 10/19/19   Zigmund Gottron, NP  predniSONE (STERAPRED UNI-PAK 21 TAB) 10 MG (21) TBPK tablet Take by mouth daily. Per box instruction 10/19/19   Zigmund Gottron, NP  Spacer/Aero-Holding Chambers (AEROCHAMBER PLUS) inhaler Use as instructed 10/08/19   Melynda Ripple, MD  Vitamin D, Ergocalciferol, (DRISDOL) 1.25 MG (50000 UNIT) CAPS capsule Take 50,000 Units by mouth every 7 (seven) days. Patient not taking: Reported on 09/05/2019    [provider]  famotidine (PEPCID) 20 MG tablet Take 20 mg by mouth 2 (two) times daily. Patient not taking: Reported on 09/19/2019  10/08/19  [provider]    Family History Family History  Problem Relation Age of Onset   Heart disease Maternal Grandfather    Heart disease Paternal Uncle    Prostate cancer Paternal Uncle    Hypertension Mother    Diabetes Mother    Heart attack Father    Diabetes Father    Hypertension Father    Colon polyps Sister    Colon cancer Neg Hx    Esophageal cancer Neg Hx    Rectal cancer Neg Hx    Stomach cancer Neg Hx     Social History Social History   Tobacco Use   Smoking status: Former Smoker    Packs/day: 1.00    Years: 20.00    Pack years: 20.00  Types: Cigarettes    Quit date: 01/13/2011    Years since quitting: 8.7   Smokeless tobacco: Never Used  Vaping Use   Vaping Use: Never used  Substance Use Topics   Alcohol use: No    Alcohol/week: 0.0 standard drinks   Drug use: No     Allergies   Percocet [oxycodone-acetaminophen] and Vicodin [hydrocodone-acetaminophen]   Review of Systems Review of Systems   Physical Exam Triage Vital Signs ED Triage Vitals  Enc Vitals Group     BP 10/19/19 1818 129/88     Pulse Rate 10/19/19 1818 88     Resp 10/19/19 1818 16     Temp 10/19/19 1818 98.3 F (36.8 C)     Temp Source 10/19/19 1818 Oral     SpO2 10/19/19 1818 97 %     Weight 10/19/19 1820 229 lb  (103.9 kg)     Height --      Head Circumference --      Peak Flow --      Pain Score 10/19/19 1819 9     Pain Loc --      Pain Edu? --      Excl. in [place]? --    No data found.  Updated Vital Signs BP 129/88 (BP Location: Left Arm)    Pulse 88    Temp 98.3 F (36.8 C) (Oral)    Resp 16    Wt 229 lb (103.9 kg)    SpO2 97%    BMI 34.82 kg/m   Visual Acuity Right Eye Distance:   Left Eye Distance:   Bilateral Distance:    Right Eye Near:   Left Eye Near:    Bilateral Near:     Physical Exam Constitutional:      Appearance: He is well-developed.  HENT:     [name]: Rhinorrhea present.  Cardiovascular:     Rate and Rhythm: Normal rate.  Pulmonary:     Effort: Pulmonary effort is normal.  Skin:    General: Skin is warm and dry.  Neurological:     Mental Status: He is alert and oriented to person, place, and time.      UC Treatments / Results  Labs (all labs ordered are listed, but only abnormal results are displayed) Labs Reviewed - No data to display  EKG   Radiology No results found.  Procedures Procedures (including critical care time)  Medications Ordered in UC Medications  AeroChamber Plus Flo-Vu Small device MISC 1 each (1 each Other Provided for home use 10/19/19 1854)    Initial Impression / Assessment and Plan / UC Course  I have reviewed the triage vital signs and the nursing notes.  Pertinent labs & imaging results that were available during my care of the patient were reviewed by me and considered in my medical decision making (see chart for details).     Non toxic. Benign physical exam.  Congestion/ nasal drainage still present. No work of breathing. Some intermittent chest tightness but primarily with facial pressure and drainage. He was unable to fill the spacer for his inhaler due to cost, provided here in clinic today. Prednisone course also provided. Discussed continued supportive care, no indication of bacterial sinusitis at this time. Return  precautions provided. Patient verbalized understanding and agreeable to plan. Ambulatory out of clinic without difficulty.    Final Clinical Impressions(s) / UC Diagnoses   Final diagnoses:  [id number] virus infection  Head congestion     Discharge Instructions  Prednisone course.  Push fluids to ensure adequate hydration and keep secretions thin.  Tylenol and/or ibuprofen as needed for pain or fevers.  Nasal spray up to 4 times a day as needed for nasal congestion.  If symptoms worsen or do not improve in the next week to return to be seen or to follow up with your PCP.      ED Prescriptions    Medication Sig Dispense Auth. Provider   predniSONE (STERAPRED UNI-PAK 21 TAB) 10 MG (21) TBPK tablet Take by mouth daily. Per box instruction 21 tablet Julaine Zimny B, NP   ipratropium (ATROVENT) 0.06 % nasal spray Place 2 sprays into both nostrils 4 (four) times daily as needed for rhinitis. 15 mL Zigmund Gottron, NP     PDMP not reviewed this encounter.   Zigmund Gottron, NP 10/20/19 2006

## 2019-10-21 ENCOUNTER — Other Ambulatory Visit: Payer: Self-pay

## 2019-10-21 ENCOUNTER — Ambulatory Visit (INDEPENDENT_AMBULATORY_CARE_PROVIDER_SITE_OTHER): Payer: PRIVATE HEALTH INSURANCE | Admitting: Emergency Medicine

## 2019-10-21 ENCOUNTER — Encounter: Payer: Self-pay | Admitting: Emergency Medicine

## 2019-10-21 VITALS — BP 118/68 | HR 120 | Temp 97.4°F | Ht 67.0 in | Wt 228.6 lb

## 2019-10-21 DIAGNOSIS — U071 COVID-19: Secondary | ICD-10-CM | POA: Diagnosis not present

## 2019-10-21 DIAGNOSIS — D869 Sarcoidosis, unspecified: Secondary | ICD-10-CM

## 2019-10-21 DIAGNOSIS — R911 Solitary pulmonary nodule: Secondary | ICD-10-CM

## 2019-10-21 NOTE — Assessment & Plan Note (Signed)
Still with nasal congestion, headache, sinus drainage.  He is about to start prednisone per his primary physician.  He will call if symptoms do not start to resolve over time and after the prednisone.  If not we may need to treat him for a possible superimposed bacterial sinusitis

## 2019-10-21 NOTE — Assessment & Plan Note (Signed)
Suspect he does have sarcoid based on his imaging.  Stable CT chest reassuring, do not suspect active pulmonary disease at this time.  He may have associated obstructive lung disease and pulmonary function testing will be rescheduled so that we can evaluate further.

## 2019-10-21 NOTE — Progress Notes (Signed)
Subjective:    Patient ID: Nathan [name], male    DOB: 1970/08/18, 49 y.o.   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  Treated with steroids intermittently in the past, not on currently. He did have a pred taper over a month ago from his PCP  He has baseline dyspnea. Has trouble with stairs, with walking an extended distance. Has not had any wheezing, but has heard some in the past. Has some chest and back tightness at times. He has albuterol, uses it a few times a day.   Pulmonary function testing from 6/19 reviewed by me showed grossly normal airflows with decreased function after albuterol, normal lung volumes and normal diffusion capacity.  CT chest 02/12/2017 reviewed by me, shows mediastinal bilateral hilar lymphadenopathy with some subpleural blebbing and scarring in both upper lobes, right lower lobe.  There was a stable 9 mm medial left lower lobe pulmonary nodule.  ROV 10/21/19 --follow-up visit for 49 year old former smoker with clinically diagnosed sarcoid (some question as to whether is skin biopsy 2019 was actually diagnostic, showed granulomatous dermatitis).  He has been treated in the past with prednisone.  I met him in July at which time his ACE level was normal.  We ordered a CT chest and PFT.  Unfortunately since that visit he contracted COVID-19 in 8/25. Was prescribed prednisone, never picked it up to use it. His PFT had to be cancelled. He is still experiencing cough, a lot of R frontal HA and drainage, greenish mucous. No fevers. He has used some albuterol - unsure if it is doing much for him.   CT chest 09/15/2019 reviewed by me, shows symmetric mild bilateral hilar lymphadenopathy, mediastinal adenopathy with a 2.7 cm subcarinal node, unchanged compared with 02/12/2017.   Upper lobe dominant ill-defined nodular scarring, again unchanged.  Left upper lobe nodule 1.3 x 0.8 cm, 9 mm subpleural posterior left lower lobe nodule.  Stable upper lobe predominant paraseptal emphysema.  Chronically elevated right hemidiaphragm.    Review of Systems As per HPI     Objective:   Physical Exam Vitals:   10/21/19 1609  BP: 118/68  Pulse: (!) 120  Temp: (!) 97.4 F (36.3 C)  TempSrc: Oral  SpO2: 97%  Weight: 228 lb 9.6 oz (103.7 kg)  Height: '5\' 7"'  (1.702 m)   Gen: Pleasant, well-nourished, in no distress,  normal affect  ENT: No lesions,  mouth clear,  oropharynx clear, no postnasal drip  Neck: No JVD, no stridor  Lungs: No use of accessory muscles, no crackles or wheezing on normal respiration, no wheeze on forced expiration  Cardiovascular: RRR, heart sounds normal, no murmur or gallops, no peripheral edema  Musculoskeletal: No deformities, no cyanosis or clubbing  Neuro: alert, awake, non focal  Skin: Warm, dark raised skin inner aspect of each orbit, no nasal lesions seen      Assessment & Plan:  Sarcoidosis Suspect he does have sarcoid based on his imaging.  Stable CT chest reassuring, do not suspect active pulmonary disease at this time.  He may have associated obstructive lung disease and pulmonary function testing will be rescheduled so that we can evaluate further.  COVID-19 virus infection Still with nasal congestion, headache, sinus drainage.  He is about to start prednisone per his primary physician.  He will call if  symptoms do not start to resolve over time and after the prednisone.  If not we may need to treat him for a possible superimposed bacterial sinusitis  Baltazar Apo, MD, PhD 10/21/2019, 5:13 PM [facility] [phone] or if no answer [phone]

## 2019-10-21 NOTE — Patient Instructions (Addendum)
Start your nasal spray and prednisone as prescribed by urgent care.  We will reschedule your pulmonary function testing.  Keep albuterol available to use 2 puffs up to every 4 hours if needed for shortness of breath, chest tightness, wheezing. Keep track of whether it helps your breathing.  Follow with Dr. Lamonte Sakai next available with full pulmonary function testing on the same day.

## 2019-10-24 ENCOUNTER — Encounter: Payer: Self-pay | Admitting: Physical Therapy

## 2019-10-24 ENCOUNTER — Ambulatory Visit: Payer: PRIVATE HEALTH INSURANCE | Attending: Family Medicine | Admitting: Physical Therapy

## 2019-10-24 ENCOUNTER — Other Ambulatory Visit: Payer: Self-pay

## 2019-10-24 DIAGNOSIS — M545 Low back pain, unspecified: Secondary | ICD-10-CM

## 2019-10-24 DIAGNOSIS — M542 Cervicalgia: Secondary | ICD-10-CM | POA: Insufficient documentation

## 2019-10-24 DIAGNOSIS — R252 Cramp and spasm: Secondary | ICD-10-CM | POA: Insufficient documentation

## 2019-10-24 NOTE — Therapy (Signed)
[place], [place], [address] Phone: [phone]   Fax:  [phone]  Physical Therapy Evaluation  Patient Details  Name: Nathan [name] MRN: [id number] Date of Birth: 06-06-1970 Referring Provider (PT): Vassie Moment   Encounter Date: 10/24/2019   PT End of Session - 10/24/19 0834    Visit Number 1    Date for PT Re-Evaluation 12/24/19    PT Start Time 0740    PT Stop Time 0831    PT Time Calculation (min) 51 min    Activity Tolerance Patient tolerated treatment well    Behavior During Therapy [facility] for tasks assessed/performed           Past Medical History:  Diagnosis Date  . Arthritis   . Asthma    'I think I might have asthma"  . Chronic back pain   . Dyspnea    chronic  . Hyperlipidemia   . Sarcoidosis     History reviewed. No pertinent surgical history.  There were no vitals filed for this visit.    Subjective Assessment - 10/24/19 0752    Subjective Patient reports a long history of neck and back pain.  His x-rays show some DDD in the cervical and lumbar spine.  He reports that he has had worse pain recently.    Limitations House hold activities;Lifting    Patient Stated Goals have less pain    Currently in Pain? Yes    Pain Score 9     Pain Location Back   neck   Pain Orientation Upper;Lower    Pain Descriptors / Indicators Aching;Sharp;Tightness    Pain Type Acute pain    Pain Radiating Towards denies    Pain Onset More than a month ago    Pain Frequency Constant    Aggravating Factors  difficulty sleeping, reaching, moving pain up to 10/10    Pain Relieving Factors tries ice and heat, massage at best a 6-7/10    Effect of Pain on Daily Activities limits all ADL's              [facility] PT Assessment - 10/24/19 0001      Assessment   Medical Diagnosis neck and back pain    Referring Provider (PT) Tamika Lott    Onset Date/Surgical Date 10/10/19    Hand  Dominance Right    Prior Therapy no      Precautions   Precautions None      Balance Screen   Has the patient fallen in the past 6 months No    Has the patient had a decrease in activity level because of a fear of falling?  No    Is the patient reluctant to leave their home because of a fear of falling?  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    Leisure no exercise      Posture/Postural Control   Posture Comments slouched sitting      ROM / Strength   AROM / PROM / Strength AROM;Strength      AROM   Overall AROM Comments Cervical ROM decreased 50% with some pain, Right shoulder flexion limited to 100 degrees with some right shoulder pain, Lumbar ROM  decreased 50% with pain in the low back      Strength   Overall Strength Comments 4+/5 with mild pain      Flexibility   Soft Tissue Assessment /Muscle Length yes    Hamstrings tight    Piriformis tight      Palpation   Palpation comment significant spasms and tightness in the upper traps, lumbar area and neck, very tender                      Objective measurements completed on examination: See above findings.       [place] Adult PT Treatment/Exercise - 10/24/19 0001      Modalities   Modalities Electrical Stimulation;Moist Heat      Moist Heat Therapy   Number Minutes Moist Heat 10 Minutes    Moist Heat Location Cervical;Lumbar Spine      Electrical Stimulation   Electrical Stimulation Location cervical and lumbar    Electrical Stimulation Action premod    Electrical Stimulation Parameters supine    Electrical Stimulation Goals Pain                  PT Education - 10/24/19 0833    Education Details HEP WMs flexion    Person(s) Educated Patient    Methods Explanation;Demonstration;Handout     Comprehension Verbalized understanding            PT Short Term Goals - 10/24/19 0840      PT SHORT TERM GOAL #1   Title independent with initial HEP    Time 2    Period Weeks    Status New             PT Long Term Goals - 10/24/19 0840      PT LONG TERM GOAL #1   Title understand posture and body mechanics    Time 8    Period Weeks    Status New      PT LONG TERM GOAL #2   Title decrease pain 50%    Time 8    Period Weeks    Status New      PT LONG TERM GOAL #3   Title increase cervical ROM 25%    Time 8    Period Weeks    Status New      PT LONG TERM GOAL #4   Title increase lumbar ROM 25%    Time 8    Period Weeks    Status New                  Plan - 10/24/19 2585    Clinical Impression Statement Patient reports a hx of neck and low back pain, he is unsure of a specific cause, reports that x-rays showed DDD cervical and lumbar.  He has limited ROM is very tight with significant spasms in the neck and upper traps, also in the lumbar area.    Stability/Clinical Decision Making Stable/Uncomplicated    Clinical Decision Making Low    Rehab Potential Good    PT Frequency 2x / week    PT Duration 8 weeks    PT Treatment/Interventions ADLs/Self Care Home Management;Cryotherapy;Electrical Stimulation;Moist Heat;Traction;Ultrasound;Therapeutic exercise;Therapeutic activities;Patient/family education;Manual techniques;Dry needling    PT Next Visit Plan slowly start gym and movements    Consulted and Agree with Plan of Care Patient           Patient will benefit from skilled therapeutic intervention in order to improve the  following deficits and impairments:  Abnormal gait, Decreased activity tolerance, Decreased range of motion, Increased muscle spasms, Pain, Improper body mechanics, Impaired flexibility, Postural dysfunction, Decreased strength  Visit Diagnosis: Cervicalgia - Plan: PT plan of care cert/re-cert  Acute bilateral low back pain  without sciatica - Plan: PT plan of care cert/re-cert  Cramp and spasm - Plan: PT plan of care cert/re-cert     Problem List Patient Active Problem List   Diagnosis Date Noted  . COVID-19 virus infection 10/21/2019  . Shoulder pain, left 10/23/2016  . Other chest pain 08/22/2014  . Upper airway cough syndrome 07/23/2014  . Sarcoidosis 03/06/2011    Sumner Boast., PT 10/24/2019, 8:43 AM  [address] [place], [place], [address] Phone: [phone]   Fax:  [phone]  Name: Nathan [name] MRN: [id number] Date of Birth: 01/12/1971

## 2019-10-26 ENCOUNTER — Other Ambulatory Visit: Payer: Self-pay

## 2019-10-26 ENCOUNTER — Ambulatory Visit (HOSPITAL_COMMUNITY)
Admission: EM | Admit: 2019-10-26 | Discharge: 2019-10-26 | Disposition: A | Discharge status: Home / Self Care | Payer: 59 | Attending: Urgent Care | Admitting: Urgent Care

## 2019-10-26 ENCOUNTER — Ambulatory Visit (INDEPENDENT_AMBULATORY_CARE_PROVIDER_SITE_OTHER): Payer: 59

## 2019-10-26 ENCOUNTER — Encounter (HOSPITAL_COMMUNITY): Payer: Self-pay

## 2019-10-26 DIAGNOSIS — R079 Chest pain, unspecified: Secondary | ICD-10-CM

## 2019-10-26 DIAGNOSIS — R0789 Other chest pain: Secondary | ICD-10-CM | POA: Diagnosis not present

## 2019-10-26 DIAGNOSIS — Z8616 Personal history of COVID-19: Secondary | ICD-10-CM

## 2019-10-26 MED ORDER — NAPROXEN 500 MG PO TABS: 500.0000 mg | ORAL_TABLET | Freq: Two times a day (BID) | ORAL | 0 refills | Status: DC

## 2019-10-26 MED ORDER — TIZANIDINE HCL 4 MG PO TABS: 4.0000 mg | ORAL_TABLET | Freq: Three times a day (TID) | ORAL | 0 refills | Status: DC | PRN

## 2019-10-26 MED ORDER — PROMETHAZINE-DM 6.25-15 MG/5ML PO SYRP: 5.0000 mL | ORAL_SOLUTION | Freq: Every evening | ORAL | 0 refills | Status: DC | PRN

## 2019-10-26 MED ORDER — BENZONATATE 100 MG PO CAPS: 100.0000 mg | ORAL_CAPSULE | Freq: Three times a day (TID) | ORAL | 0 refills | Status: DC | PRN

## 2019-10-26 NOTE — ED Provider Notes (Signed)
[place]   MRN: [id number] DOB: 1970-07-16  Subjective:   Nathan [name] is a 49 y.o. male presenting for 3-day history of cute onset persistent recurrent mid lower chest pain, left-sided chest pain.  Patient has a history of COVID-19, diagnosed about 3 weeks ago.  Has undergone treatment for this.  Has a history of sarcoidosis as well.  Has been given a prednisone course which she finished.  Has been using albuterol inhaler as well.  No current facility-administered medications for this encounter.  Current Outpatient Medications:  .  albuterol (VENTOLIN HFA) 108 (90 Base) MCG/ACT inhaler, Inhale 2 puffs into the lungs every 6 (six) hours as needed for wheezing or shortness of breath., Disp: 18 g, Rfl: 3 .  aspirin 81 MG chewable tablet, Chew 81 mg by mouth daily., Disp: , Rfl:  .  ibuprofen (ADVIL) 600 MG tablet, Take 1 tablet (600 mg total) by mouth every 6 (six) hours as needed., Disp: 30 tablet, Rfl: 0 .  ipratropium (ATROVENT) 0.06 % nasal spray, Place 2 sprays into both nostrils 4 (four) times daily as needed for rhinitis., Disp: 15 mL, Rfl: 0 .  predniSONE (STERAPRED UNI-PAK 21 TAB) 10 MG (21) TBPK tablet, Take by mouth daily. Per box instruction, Disp: 21 tablet, Rfl: 0 .  rosuvastatin (CRESTOR) 5 MG tablet, Take 5 mg by mouth daily., Disp: , Rfl:  .  Spacer/Aero-Holding Chambers (AEROCHAMBER PLUS) inhaler, Use as instructed, Disp: 1 each, Rfl: 2 .  Vitamin D, Ergocalciferol, (DRISDOL) 1.25 MG (50000 UNIT) CAPS capsule, Take 50,000 Units by mouth every 7 (seven) days. , Disp: , Rfl:    Allergies  Allergen Reactions  . Percocet [Oxycodone-Acetaminophen] Itching    Able to take tylenol  . Vicodin [Hydrocodone-Acetaminophen] Itching    Past Medical History:  Diagnosis Date  . Arthritis   . Asthma    'I think I might have asthma"  . Chronic back pain   . Dyspnea    chronic  . Hyperlipidemia   . Sarcoidosis      History reviewed. No pertinent  surgical history.  Family History  Problem Relation Age of Onset  . Heart disease Maternal Grandfather   . Heart disease Paternal Uncle   . Prostate cancer Paternal Uncle   . Hypertension Mother   . Diabetes Mother   . Heart attack Father   . Diabetes Father   . Hypertension Father   . Colon polyps Sister   . Colon cancer Neg Hx   . Esophageal cancer Neg Hx   . Rectal cancer Neg Hx   . Stomach cancer Neg Hx     Social History   Tobacco Use  . Smoking status: Former Smoker    Packs/day: 1.00    Years: 20.00    Pack years: 20.00    Types: Cigarettes    Quit date: 01/13/2011    Years since quitting: 8.7  . Smokeless tobacco: Never Used  Vaping Use  . Vaping Use: Never used  Substance Use Topics  . Alcohol use: No    Alcohol/week: 0.0 standard drinks  . Drug use: No    ROS   Objective:   Vitals: BP 132/80   Pulse 61   Temp 98.5 F (36.9 C)   Resp 19   SpO2 99%   Physical Exam  ED ECG REPORT   Date: 10/26/2019  Rate: 67 bpm  Rhythm: normal sinus rhythm  QRS Axis: normal  Intervals: normal  ST/T Wave abnormalities:  normal  Conduction Disutrbances:none  Narrative Interpretation: Sinus rhythm at 67 bpm, no changes from previous EKG.  Old EKG Reviewed: unchanged  I have personally reviewed the EKG tracing and agree with the computerized printout as noted.  DG Chest 2 View  Result Date: 10/26/2019 CLINICAL DATA:  Chest pain EXAM: CHEST - 2 VIEW COMPARISON:  October 08, 2019 FINDINGS: There is mild elevation of the right hemidiaphragm. There is mild scarring in the left upper lobe, stable. There is no edema or airspace opacity. Heart size and pulmonary vascularity are normal. No adenopathy. No pneumothorax. No bone lesions. IMPRESSION: Stable scarring left upper lobe. Stable mild elevation the right hemidiaphragm. No edema or airspace opacity. Cardiac silhouette within normal limits. Electronically Signed   By: Lowella Grip III M.D.   On: 10/26/2019 16:26     Assessment and Plan :   PDMP not reviewed this encounter.  1. Chest wall pain   2. Atypical chest pain   3. History of COVID-19     Suspect chest wall pain related to persistent coughing, sequelae from COVID-19.  EKG and chest x-ray reassuring.  Recommended supportive care, cough suppression medications, NSAID with muscle relaxant.  Follow-up with pulmonologist for recheck on his sarcoidosis. Counseled patient on potential for adverse effects with medications prescribed/recommended today, ER and return-to-clinic precautions discussed, patient verbalized understanding.    Jaynee Eagles, PA-C 10/26/19 1722

## 2019-10-26 NOTE — ED Triage Notes (Addendum)
Pt presents with complaints of having pressure in his chest x 3 days that is in the middle of his chest. Reports the pain is also on his left side. Denies relief with home remedies. Pt denies any shortness of breath that is not normal for him or diaphoresis. Reports he had covid x 3 weeks ago.

## 2019-10-28 ENCOUNTER — Other Ambulatory Visit: Payer: Self-pay

## 2019-10-28 ENCOUNTER — Ambulatory Visit: Payer: PRIVATE HEALTH INSURANCE

## 2019-10-28 DIAGNOSIS — R252 Cramp and spasm: Secondary | ICD-10-CM

## 2019-10-28 DIAGNOSIS — M542 Cervicalgia: Secondary | ICD-10-CM

## 2019-10-28 DIAGNOSIS — M545 Low back pain, unspecified: Secondary | ICD-10-CM

## 2019-10-28 NOTE — Therapy (Signed)
[place], [place], [address] Phone: [phone]   Fax:  [phone]  Physical Therapy Treatment  Patient Details  Name: Nathan [name] MRN: [id number] Date of Birth: 1970/12/07 Referring Provider (PT): Vassie Moment   Encounter Date: 10/28/2019   PT End of Session - 10/28/19 0859    Visit Number 2    Date for PT Re-Evaluation 12/24/19    PT Start Time 0847    PT Stop Time 0930    PT Time Calculation (min) 43 min    Activity Tolerance Patient tolerated treatment well    Behavior During Therapy [facility] for tasks assessed/performed           Past Medical History:  Diagnosis Date  . Arthritis   . Asthma    'I think I might have asthma"  . Chronic back pain   . Dyspnea    chronic  . Hyperlipidemia   . Sarcoidosis     No past surgical history on file.  There were no vitals filed for this visit.   Subjective Assessment - 10/28/19 0848    Subjective Pt went to urgent care on 9/12 for chest pain. He tested positive for COVID with his family on 8/25 and was told this is a post effect that is related likely to a lot of coughing.    Limitations House hold activities;Lifting    Patient Stated Goals have less pain    Currently in Pain? Yes    Pain Score 8     Pain Location Neck    Pain Orientation Left;Right    Pain Descriptors / Indicators Aching;Sharp;Dull   pain varies   Pain Type Acute pain    Pain Onset More than a month ago                             [facility] Adult PT Treatment/Exercise - 10/28/19 0001      Exercises   Exercises Neck      Neck Exercises: Machines for Strengthening   Cybex Row x15 25#   max VC/TC for form   Lat Pull x15 25#   min VC/TC for UT relaxation     Neck Exercises: Seated   Neck Retraction 10 reps;5 secs    Neck Retraction Limitations tactile cues for cervical posturing    Other Seated Exercise child's post fwd and lateral with SB       Neck Exercises: Supine   Other Supine Exercise FR T/S Ext in H/L HBH x12, last rep hold for 5 deep breaths in extension      Neck Exercises: Sidelying   Other Sidelying Exercise B open books x15 c HBH                    PT Short Term Goals - 10/24/19 0840      PT SHORT TERM GOAL #1   Title independent with initial HEP    Time 2    Period Weeks    Status New             PT Long Term Goals - 10/24/19 0840      PT LONG TERM GOAL #1   Title understand posture and body mechanics    Time 8    Period Weeks    Status New      PT LONG TERM GOAL #2   Title decrease pain 50%  Time 8    Period Weeks    Status New      PT LONG TERM GOAL #3   Title increase cervical ROM 25%    Time 8    Period Weeks    Status New      PT LONG TERM GOAL #4   Title increase lumbar ROM 25%    Time 8    Period Weeks    Status New                 Plan - 10/28/19 0900    Clinical Impression Statement Pt tolerated tx well with report of improvement in pain following session (not a specific number). Focused session on thoracic/ribcage mobility due to c/o of L sided thorax pain and cervical/scapular stabilization.    Stability/Clinical Decision Making Stable/Uncomplicated    Rehab Potential Good    PT Frequency 2x / week    PT Duration 8 weeks    PT Treatment/Interventions ADLs/Self Care Home Management;Cryotherapy;Electrical Stimulation;Moist Heat;Traction;Ultrasound;Therapeutic exercise;Therapeutic activities;Patient/family education;Manual techniques;Dry needling    PT Next Visit Plan Thoracic mobility, posture, scapular and postural strength and endurance    Consulted and Agree with Plan of Care Patient           Patient will benefit from skilled therapeutic intervention in order to improve the following deficits and impairments:  Abnormal gait, Decreased activity tolerance, Decreased range of motion, Increased muscle spasms, Pain, Improper body mechanics, Impaired  flexibility, Postural dysfunction, Decreased strength  Visit Diagnosis: Cervicalgia  Acute bilateral low back pain without sciatica  Cramp and spasm     Problem List Patient Active Problem List   Diagnosis Date Noted  . COVID-19 virus infection 10/21/2019  . Shoulder pain, left 10/23/2016  . Other chest pain 08/22/2014  . Upper airway cough syndrome 07/23/2014  . Sarcoidosis 03/06/2011    Nathan [name], PT, DPT 10/28/2019, 1:04 PM  [place] [address] [place], [place], [address] Phone: [phone]   Fax:  [phone]  Name: Nathan [name] MRN: [id number] Date of Birth: 1970-07-06

## 2019-10-29 ENCOUNTER — Other Ambulatory Visit: Payer: Self-pay

## 2019-10-29 ENCOUNTER — Ambulatory Visit: Payer: PRIVATE HEALTH INSURANCE | Admitting: Physical Therapy

## 2019-10-29 ENCOUNTER — Encounter: Payer: Self-pay | Admitting: Physical Therapy

## 2019-10-29 DIAGNOSIS — R252 Cramp and spasm: Secondary | ICD-10-CM

## 2019-10-29 DIAGNOSIS — M542 Cervicalgia: Secondary | ICD-10-CM

## 2019-10-29 DIAGNOSIS — M545 Low back pain, unspecified: Secondary | ICD-10-CM

## 2019-10-29 NOTE — Therapy (Signed)
[place], [place], [address] Phone: [phone]   Fax:  [phone]  Physical Therapy Treatment  Patient Details  Name: Nathan [name] MRN: [id number] Date of Birth: Jun 02, 1970 Referring Provider (PT): Vassie Moment   Encounter Date: 10/29/2019   PT End of Session - 10/29/19 1345    Visit Number 3    Date for PT Re-Evaluation 12/24/19    PT Start Time 1300    PT Stop Time 1400    PT Time Calculation (min) 60 min    Activity Tolerance Patient tolerated treatment well    Behavior During Therapy [facility] for tasks assessed/performed           Past Medical History:  Diagnosis Date  . Arthritis   . Asthma    'I think I might have asthma"  . Chronic back pain   . Dyspnea    chronic  . Hyperlipidemia   . Sarcoidosis     History reviewed. No pertinent surgical history.  There were no vitals filed for this visit.   Subjective Assessment - 10/29/19 1251    Subjective Patient reports very tired after the last treatment    Currently in Pain? Yes    Pain Score 8     Pain Location Neck                             [facility] Adult PT Treatment/Exercise - 10/29/19 0001      Neck Exercises: Machines for Strengthening   Nustep level 4 x 6 minutes    Cybex Row 20# 2x10    Cybex Chest Press 10# 2x10    Lat Pull 20# 2x10      Neck Exercises: Theraband   Shoulder External Rotation Red;20 reps      Neck Exercises: Standing   Other Standing Exercises W backs, did some wtith a wand very tight in the right shoulder    Other Standing Exercises wand extension with PT overpressure      Neck Exercises: Seated   Other Seated Exercise isometric abs phsyioball in lap      Modalities   Modalities Cryotherapy;Electrical Stimulation      Cryotherapy   Number Minutes Cryotherapy 10 Minutes    Cryotherapy Location Cervical    Type of Cryotherapy Ice pack      Electrical Stimulation    Electrical Stimulation Location cervical    Electrical Stimulation Action IFC    Electrical Stimulation Parameters supine    Electrical Stimulation Goals Pain      Manual Therapy   Manual Therapy Soft tissue mobilization    Soft tissue mobilization bilateral upper traps and the cervical area                    PT Short Term Goals - 10/29/19 1347      PT SHORT TERM GOAL #1   Title independent with initial HEP    Status Partially Met             PT Long Term Goals - 10/24/19 0840      PT LONG TERM GOAL #1   Title understand posture and body mechanics    Time 8    Period Weeks    Status New      PT LONG TERM GOAL #2   Title decrease pain 50%    Time 8    Period Weeks  Status New      PT LONG TERM GOAL #3   Title increase cervical ROM 25%    Time 8    Period Weeks    Status New      PT LONG TERM GOAL #4   Title increase lumbar ROM 25%    Time 8    Period Weeks    Status New                 Plan - 10/29/19 1345    Clinical Impression Statement Patient very tight in the right shoulder, very tight in the upper traps and the cervical paraspinals.  I gave him info about the DN.  Tried some STM, very sore. The right shoulder has limited ER    PT Next Visit Plan Thoracic mobility, posture, scapular and postural strength and endurance    Consulted and Agree with Plan of Care Patient           Patient will benefit from skilled therapeutic intervention in order to improve the following deficits and impairments:  Abnormal gait, Decreased activity tolerance, Decreased range of motion, Increased muscle spasms, Pain, Improper body mechanics, Impaired flexibility, Postural dysfunction, Decreased strength  Visit Diagnosis: Cervicalgia  Acute bilateral low back pain without sciatica  Cramp and spasm     Problem List Patient Active Problem List   Diagnosis Date Noted  . COVID-19 virus infection 10/21/2019  . Shoulder pain, left 10/23/2016  .  Other chest pain 08/22/2014  . Upper airway cough syndrome 07/23/2014  . Sarcoidosis 03/06/2011    Sumner Boast., PT 10/29/2019, 1:47 PM  [place] [address] [place], [place], [address] Phone: [phone]   Fax:  [phone]  Name: Nathan [name] MRN: [id number] Date of Birth: 1970/08/21

## 2019-10-29 NOTE — Patient Instructions (Signed)

## 2019-11-03 ENCOUNTER — Encounter: Payer: Self-pay | Admitting: Physical Therapy

## 2019-11-03 ENCOUNTER — Other Ambulatory Visit: Payer: Self-pay

## 2019-11-03 ENCOUNTER — Ambulatory Visit: Payer: PRIVATE HEALTH INSURANCE | Admitting: Physical Therapy

## 2019-11-03 DIAGNOSIS — M545 Low back pain, unspecified: Secondary | ICD-10-CM

## 2019-11-03 DIAGNOSIS — M542 Cervicalgia: Secondary | ICD-10-CM | POA: Diagnosis not present

## 2019-11-03 DIAGNOSIS — R252 Cramp and spasm: Secondary | ICD-10-CM

## 2019-11-03 NOTE — Therapy (Signed)
[place] [place], [place], [address] Phone: [phone]   Fax:  [phone]  Physical Therapy Treatment  Patient Details  Name: Nathan [name] MRN: [id number] Date of Birth: 06/28/1970 Referring Provider (PT): Vassie Moment   Encounter Date: 11/03/2019   PT End of Session - 11/03/19 0922    Visit Number 4    Date for PT Re-Evaluation 12/24/19    PT Start Time 0845    PT Stop Time 0937    PT Time Calculation (min) 52 min    Activity Tolerance Patient tolerated treatment well    Behavior During Therapy [facility] for tasks assessed/performed           Past Medical History:  Diagnosis Date  . Arthritis   . Asthma    'I think I might have asthma"  . Chronic back pain   . Dyspnea    chronic  . Hyperlipidemia   . Sarcoidosis     History reviewed. No pertinent surgical history.  There were no vitals filed for this visit.   Subjective Assessment - 11/03/19 0846    Subjective Doing all right but always in pain    Currently in Pain? Yes    Pain Score 8     Pain Location Back                             [facility] Adult PT Treatment/Exercise - 11/03/19 0001      Neck Exercises: Machines for Strengthening   Nustep level 4 x 6 minutes    Cybex Row 20# 2x10    Cybex Chest Press 10# 2x10    Lat Pull 20# 2x10      Neck Exercises: Theraband   Shoulder External Rotation 20 reps   yellow     Neck Exercises: Standing   Other Standing Exercises Shoulder Ext 10lb 2x10       Neck Exercises: Seated   Neck Retraction 20 reps;3 secs    Other Seated Exercise isometric abs phsyioball in lap      Modalities   Modalities Cryotherapy;Electrical Stimulation      Cryotherapy   Number Minutes Cryotherapy 10 Minutes    Cryotherapy Location Cervical    Type of Cryotherapy Ice pack      Electrical Stimulation   Electrical Stimulation Location cervical    Electrical Stimulation Action IFC     Electrical Stimulation Parameters supine    Electrical Stimulation Goals Pain                    PT Short Term Goals - 10/29/19 1347      PT SHORT TERM GOAL #1   Title independent with initial HEP    Status Partially Met             PT Long Term Goals - 10/24/19 0840      PT LONG TERM GOAL #1   Title understand posture and body mechanics    Time 8    Period Weeks    Status New      PT LONG TERM GOAL #2   Title decrease pain 50%    Time 8    Period Weeks    Status New      PT LONG TERM GOAL #3   Title increase cervical ROM 25%    Time 8    Period Weeks    Status New  PT LONG TERM GOAL #4   Title increase lumbar ROM 25%    Time 8    Period Weeks    Status New                 Plan - 11/03/19 4446    Clinical Impression Statement Pt enters clinic reporting constant pain with no relief. Tightness in the R shoulder remains. Cue needed to keep shoulder retracted with seated rows. Cues for core engagement needed with seated ab sets.    Stability/Clinical Decision Making Stable/Uncomplicated    Rehab Potential Good    PT Frequency 2x / week    PT Duration 8 weeks    PT Treatment/Interventions ADLs/Self Care Home Management;Cryotherapy;Electrical Stimulation;Moist Heat;Traction;Ultrasound;Therapeutic exercise;Therapeutic activities;Patient/family education;Manual techniques;Dry needling    PT Next Visit Plan Thoracic mobility, posture, scapular and postural strength and endurance           Patient will benefit from skilled therapeutic intervention in order to improve the following deficits and impairments:  Abnormal gait, Decreased activity tolerance, Decreased range of motion, Increased muscle spasms, Pain, Improper body mechanics, Impaired flexibility, Postural dysfunction, Decreased strength  Visit Diagnosis: Acute bilateral low back pain without sciatica  Cramp and spasm  Cervicalgia     Problem List Patient Active Problem List    Diagnosis Date Noted  . COVID-19 virus infection 10/21/2019  . Shoulder pain, left 10/23/2016  . Other chest pain 08/22/2014  . Upper airway cough syndrome 07/23/2014  . Sarcoidosis 03/06/2011    Scot Jun, PTA 11/03/2019, 9:27 AM  [place] [place], [place], [address] Phone: [phone]   Fax:  [phone]  Name: Calton H[place]field MRN: [id number] Date of Birth: 1970/04/21

## 2019-11-05 ENCOUNTER — Encounter: Payer: Self-pay | Admitting: Physical Therapy

## 2019-11-05 ENCOUNTER — Ambulatory Visit: Payer: PRIVATE HEALTH INSURANCE | Admitting: Physical Therapy

## 2019-11-05 ENCOUNTER — Other Ambulatory Visit: Payer: Self-pay

## 2019-11-05 DIAGNOSIS — M542 Cervicalgia: Secondary | ICD-10-CM | POA: Diagnosis not present

## 2019-11-05 DIAGNOSIS — R252 Cramp and spasm: Secondary | ICD-10-CM

## 2019-11-05 DIAGNOSIS — M545 Low back pain, unspecified: Secondary | ICD-10-CM

## 2019-11-05 NOTE — Therapy (Signed)
[place]. [place], [place], [address] Phone: [phone]   Fax:  [phone]  Physical Therapy Treatment  Patient Details  Name: Nathan [name] MRN: [id number] Date of Birth: 1971/01/23 Referring Provider (PT): Vassie Moment   Encounter Date: 11/05/2019   PT End of Session - 11/05/19 0841    Visit Number 5    Date for PT Re-Evaluation 12/24/19    PT Start Time 0800    PT Stop Time 0852    PT Time Calculation (min) 52 min    Activity Tolerance Patient tolerated treatment well    Behavior During Therapy [facility] for tasks assessed/performed           Past Medical History:  Diagnosis Date  . Arthritis   . Asthma    'I think I might have asthma"  . Chronic back pain   . Dyspnea    chronic  . Hyperlipidemia   . Sarcoidosis     History reviewed. No pertinent surgical history.  There were no vitals filed for this visit.   Subjective Assessment - 11/05/19 0806    Subjective Pt reports pain between shoulder blades and L flank    Currently in Pain? Yes    Pain Score 8     Pain Location Back    Pain Orientation Left;Upper;Lower                             [facility] Adult PT Treatment/Exercise - 11/05/19 0001      Neck Exercises: Machines for Strengthening   UBE (Upper Arm Bike) L4 x 2 min each     Nustep level 5 x 6 minutes    Lat Pull 25# 2x10      Neck Exercises: Standing   Other Standing Exercises W backs, did some wtith a wand very tight in the right shoulder; Overhead back Ext yellow ball 2x10    PTA over pressure   Other Standing Exercises ER red 2x10; Horiz Abd red 2x10       Neck Exercises: Seated   Neck Retraction 20 reps;3 secs      Neck Exercises: Supine   Other Supine Exercise Supine Bridges 2x10       Cryotherapy   Number Minutes Cryotherapy 10 Minutes    Cryotherapy Location Cervical    Type of Cryotherapy Ice pack      Electrical Stimulation   Electrical  Stimulation Location cervical    Electrical Stimulation Action IFC    Electrical Stimulation Parameters supine    Electrical Stimulation Goals Pain      Manual Therapy   Manual Therapy Passive ROM    Passive ROM Lower Trunk rotations, LLE HS stretching and single K2C                    PT Short Term Goals - 10/29/19 1347      PT SHORT TERM GOAL #1   Title independent with initial HEP    Status Partially Met             PT Long Term Goals - 10/24/19 0840      PT LONG TERM GOAL #1   Title understand posture and body mechanics    Time 8    Period Weeks    Status New      PT LONG TERM GOAL #2   Title decrease pain 50%    Time 8  Period Weeks    Status New      PT LONG TERM GOAL #3   Title increase cervical ROM 25%    Time 8    Period Weeks    Status New      PT LONG TERM GOAL #4   Title increase lumbar ROM 25%    Time 8    Period Weeks    Status New                 Plan - 11/05/19 2993    Clinical Impression Statement Pt continues to report 8 to 9 out of 10 pain in this upper and lower back. His R shoulder is very tight requiring overpressure with W back. No increase in pain noted with lower trunk rotations. Postural cues give with seated lat pull downs. Good Ext noted with over head extension    Stability/Clinical Decision Making Stable/Uncomplicated    Rehab Potential Good    PT Frequency 2x / week    PT Duration 8 weeks    PT Treatment/Interventions ADLs/Self Care Home Management;Cryotherapy;Electrical Stimulation;Moist Heat;Traction;Ultrasound;Therapeutic exercise;Therapeutic activities;Patient/family education;Manual techniques;Dry needling    PT Next Visit Plan Thoracic mobility, posture, scapular and postural strength and endurance           Patient will benefit from skilled therapeutic intervention in order to improve the following deficits and impairments:  Abnormal gait, Decreased activity tolerance, Decreased range of motion,  Increased muscle spasms, Pain, Improper body mechanics, Impaired flexibility, Postural dysfunction, Decreased strength  Visit Diagnosis: Cramp and spasm  Cervicalgia  Acute bilateral low back pain without sciatica     Problem List Patient Active Problem List   Diagnosis Date Noted  . COVID-19 virus infection 10/21/2019  . Shoulder pain, left 10/23/2016  . Other chest pain 08/22/2014  . Upper airway cough syndrome 07/23/2014  . Sarcoidosis 03/06/2011    Scot Jun 11/05/2019, 8:44 AM  [place]. [place], [place], [address] Phone: [phone]   Fax:  [phone]  Name: Nathan [name] MRN: [id number] Date of Birth: 02-04-1971

## 2019-11-10 ENCOUNTER — Ambulatory Visit: Payer: PRIVATE HEALTH INSURANCE | Admitting: Physical Therapy

## 2019-11-10 ENCOUNTER — Other Ambulatory Visit: Payer: Self-pay

## 2019-11-10 ENCOUNTER — Encounter: Payer: Self-pay | Admitting: Physical Therapy

## 2019-11-10 DIAGNOSIS — M545 Low back pain, unspecified: Secondary | ICD-10-CM

## 2019-11-10 DIAGNOSIS — R252 Cramp and spasm: Secondary | ICD-10-CM

## 2019-11-10 DIAGNOSIS — M542 Cervicalgia: Secondary | ICD-10-CM

## 2019-11-10 NOTE — Therapy (Signed)
[place]. [place], [place], [address] Phone: [phone]   Fax:  [phone]  Physical Therapy Treatment  Patient Details  Name: Nathan [name] MRN: [id number] Date of Birth: Dec 21, 1970 Referring Provider (PT): Vassie Moment   Encounter Date: 11/10/2019   PT End of Session - 11/10/19 0841    Visit Number 6    Date for PT Re-Evaluation 12/24/19    PT Start Time 0800    PT Stop Time 0852    PT Time Calculation (min) 52 min    Activity Tolerance Patient tolerated treatment well    Behavior During Therapy [facility] for tasks assessed/performed           Past Medical History:  Diagnosis Date  . Arthritis   . Asthma    'I think I might have asthma"  . Chronic back pain   . Dyspnea    chronic  . Hyperlipidemia   . Sarcoidosis     History reviewed. No pertinent surgical history.  There were no vitals filed for this visit.   Subjective Assessment - 11/10/19 0802    Subjective "Feeling all right, just in a little pain but Im here"    Currently in Pain? Yes    Pain Score 8     Pain Location Back              [facility] PT Assessment - 11/10/19 0001      AROM   Overall AROM Comments Cervical ROM decreased 25& for L & R side bending all other motions WFL.Right shoulder flexion limited to 137 degrees with some right shoulder pain, Lumbar ROM 25% with side bending                         [facility] Adult PT Treatment/Exercise - 11/10/19 0001      Neck Exercises: Machines for Strengthening   UBE (Upper Arm Bike) L4 x 2 min each     Nustep level 5 x 6 minutes    Cybex Row 35lb 2x10     Cybex Chest Press 10# 2x10    Lat Pull 35lb 2x10       Neck Exercises: Standing   Other Standing Exercises W backs very tight in R shoulder     Other Standing Exercises ER red 2x10; Horiz Abd red 2x10       Neck Exercises: Seated   Neck Retraction 20 reps;3 secs      Moist Heat Therapy   Number Minutes Moist  Heat 10 Minutes    Moist Heat Location Cervical      Electrical Stimulation   Electrical Stimulation Location cervical    Electrical Stimulation Action IFC    Electrical Stimulation Parameters supine    Electrical Stimulation Goals Pain                    PT Short Term Goals - 10/29/19 1347      PT SHORT TERM GOAL #1   Title independent with initial HEP    Status Partially Met             PT Long Term Goals - 11/10/19 0819      PT LONG TERM GOAL #1   Title understand posture and body mechanics    Status Partially Met      PT LONG TERM GOAL #2   Title decrease pain 50%    Status On-going  PT LONG TERM GOAL #3   Title increase cervical ROM 25%    Status Achieved      PT LONG TERM GOAL #4   Title increase lumbar ROM 25%    Status Achieved                 Plan - 11/10/19 0841    Clinical Impression Statement Pt reports a constant 8/10 neck pain without relief. Despite report he  has increased his cervical and lumbar AROM, meeting goals. R shoulder tightness noted with W back and external rotation. Reports increase tightness with lat pull downs.    Stability/Clinical Decision Making Stable/Uncomplicated    Rehab Potential Good    PT Frequency 2x / week    PT Duration 8 weeks    PT Treatment/Interventions ADLs/Self Care Home Management;Cryotherapy;Electrical Stimulation;Moist Heat;Traction;Ultrasound;Therapeutic exercise;Therapeutic activities;Patient/family education;Manual techniques;Dry needling    PT Next Visit Plan Thoracic mobility, posture, scapular and postural strength and endurance           Patient will benefit from skilled therapeutic intervention in order to improve the following deficits and impairments:  Abnormal gait, Decreased activity tolerance, Decreased range of motion, Increased muscle spasms, Pain, Improper body mechanics, Impaired flexibility, Postural dysfunction, Decreased strength  Visit Diagnosis: Acute bilateral low  back pain without sciatica  Cervicalgia  Cramp and spasm     Problem List Patient Active Problem List   Diagnosis Date Noted  . COVID-19 virus infection 10/21/2019  . Shoulder pain, left 10/23/2016  . Other chest pain 08/22/2014  . Upper airway cough syndrome 07/23/2014  . Sarcoidosis 03/06/2011    Scot Jun, PTA 11/10/2019, 8:44 AM  [place]. [place], [place], [address] Phone: [phone]   Fax:  [phone]  Name: Nathan [name] MRN: [id number] Date of Birth: 1970-12-15

## 2019-11-12 ENCOUNTER — Encounter: Payer: PRIVATE HEALTH INSURANCE | Admitting: Physical Therapy

## 2019-11-17 ENCOUNTER — Ambulatory Visit: Payer: PRIVATE HEALTH INSURANCE | Admitting: Physical Therapy

## 2019-11-18 ENCOUNTER — Ambulatory Visit (HOSPITAL_COMMUNITY)
Admission: EM | Admit: 2019-11-18 | Discharge: 2019-11-18 | Disposition: A | Discharge status: Home / Self Care | Payer: PRIVATE HEALTH INSURANCE | Attending: Family Medicine | Admitting: Family Medicine

## 2019-11-18 ENCOUNTER — Encounter (HOSPITAL_COMMUNITY): Payer: Self-pay

## 2019-11-18 ENCOUNTER — Other Ambulatory Visit: Payer: Self-pay

## 2019-11-18 DIAGNOSIS — H6593 Unspecified nonsuppurative otitis media, bilateral: Secondary | ICD-10-CM

## 2019-11-18 DIAGNOSIS — J3089 Other allergic rhinitis: Secondary | ICD-10-CM

## 2019-11-18 MED ORDER — CETIRIZINE HCL 10 MG PO TABS: 10.0000 mg | ORAL_TABLET | Freq: Every day | ORAL | 1 refills | Status: DC

## 2019-11-18 MED ORDER — PREDNISONE 50 MG PO TABS: ORAL_TABLET | ORAL | 0 refills | Status: DC

## 2019-11-18 MED ORDER — FLUTICASONE PROPIONATE 50 MCG/ACT NA SUSP: 1.0000 | Freq: Two times a day (BID) | NASAL | 1 refills | Status: DC

## 2019-11-18 NOTE — ED Provider Notes (Signed)
[place]    CSN: [id number] Arrival date & time: 11/18/19  1046      History   Chief Complaint Chief Complaint  Patient presents with  . Chest Pain  . Otalgia    HPI Nathan [name] is a 49 y.o. male.   Here today for evaluation of right ear pain and pressure and chronic post nasal drainage/productive cough. States he thinks he may have seasonal allergies as he's had these issues for a very long time, has not been on antihistamines for this. Took a nasal spray recently which may have helped some. Denies fever, chills, sore throat, recent sick contacts. Does have CP, SOB but notes this is baseline for him and unchanged. Following closely with Pulmonology now with upcoming spirometry testing to r/o COPD. Previous cigarette smoker, d/c'd in 2012. Has a rescue inhaler at home that does help quite a bit for short periods of time.      Past Medical History:  Diagnosis Date  . Arthritis   . Asthma    'I think I might have asthma"  . Chronic back pain   . Dyspnea    chronic  . Hyperlipidemia   . Sarcoidosis     Patient Active Problem List   Diagnosis Date Noted  . COVID-19 virus infection 10/21/2019  . Shoulder pain, left 10/23/2016  . Other chest pain 08/22/2014  . Upper airway cough syndrome 07/23/2014  . Sarcoidosis 03/06/2011    History reviewed. No pertinent surgical history.     Home Medications    Prior to Admission medications   Medication Sig Start Date End Date Taking? Authorizing Provider  albuterol (VENTOLIN HFA) 108 (90 Base) MCG/ACT inhaler Inhale 2 puffs into the lungs every 6 (six) hours as needed for wheezing or shortness of breath. 08/27/19  Yes Collene Gobble, MD  aspirin 81 MG chewable tablet Chew 81 mg by mouth daily.   Yes [provider]  ipratropium (ATROVENT) 0.06 % nasal spray Place 2 sprays into both nostrils 4 (four) times daily as needed for rhinitis. 10/19/19  Yes Burky, Lanelle Bal B, NP  rosuvastatin (CRESTOR) 5 MG  tablet Take 5 mg by mouth daily.   Yes [provider]  benzonatate (TESSALON) 100 MG capsule Take 1-2 capsules (100-200 mg total) by mouth 3 (three) times daily as needed. 10/26/19   Jaynee Eagles, PA-C  cetirizine (ZYRTEC ALLERGY) 10 MG tablet Take 1 tablet (10 mg total) by mouth daily. 11/18/19   Volney American, PA-C  fluticasone [facility]) 50 MCG/ACT nasal spray Place 1 spray into both nostrils 2 (two) times daily. 11/18/19   Volney American, PA-C  ibuprofen (ADVIL) 600 MG tablet Take 1 tablet (600 mg total) by mouth every 6 (six) hours as needed. 10/08/19   Melynda Ripple, MD  naproxen (NAPROSYN) 500 MG tablet Take 1 tablet (500 mg total) by mouth 2 (two) times daily with a meal. 10/26/19   Jaynee Eagles, PA-C  predniSONE (DELTASONE) 50 MG tablet Take 1 tab daily for 3 days with breakfast 11/18/19   Volney American, PA-C  promethazine-dextromethorphan (PROMETHAZINE-DM) 6.25-15 MG/5ML syrup Take 5 mLs by mouth at bedtime as needed for cough. 10/26/19   Jaynee Eagles, PA-C  Spacer/Aero-Holding Chambers (AEROCHAMBER PLUS) inhaler Use as instructed 10/08/19   Melynda Ripple, MD  tiZANidine (ZANAFLEX) 4 MG tablet Take 1 tablet (4 mg total) by mouth every 8 (eight) hours as needed. 10/26/19   Jaynee Eagles, PA-C  Vitamin D, Ergocalciferol, (DRISDOL) 1.25 MG (50000  UNIT) CAPS capsule Take 50,000 Units by mouth every 7 (seven) days.     [provider]  famotidine (PEPCID) 20 MG tablet Take 20 mg by mouth 2 (two) times daily. Patient not taking: Reported on 09/19/2019  10/08/19  [provider]    Family History Family History  Problem Relation Age of Onset  . Heart disease Maternal Grandfather   . Heart disease Paternal Uncle   . Prostate cancer Paternal Uncle   . Hypertension Mother   . Diabetes Mother   . Heart attack Father   . Diabetes Father   . Hypertension Father   . Colon polyps Sister   . Colon cancer Neg Hx   . Esophageal cancer Neg Hx   . Rectal  cancer Neg Hx   . Stomach cancer Neg Hx     Social History Social History   Tobacco Use  . Smoking status: Former Smoker    Packs/day: 1.00    Years: 20.00    Pack years: 20.00    Types: Cigarettes    Quit date: 01/13/2011    Years since quitting: 8.8  . Smokeless tobacco: Never Used  Vaping Use  . Vaping Use: Never used  Substance Use Topics  . Alcohol use: No    Alcohol/week: 0.0 standard drinks  . Drug use: No     Allergies   Percocet [oxycodone-acetaminophen] and Vicodin [hydrocodone-acetaminophen]   Review of Systems Review of Systems PER HPI   Physical Exam Triage Vital Signs ED Triage Vitals  Enc Vitals Group     BP 11/18/19 1124 (!) 126/97     Pulse Rate 11/18/19 1124 86     Resp 11/18/19 1124 18     Temp 11/18/19 1124 98.1 F (36.7 C)     Temp Source 11/18/19 1124 Oral     SpO2 11/18/19 1124 99 %     Weight --      Height --      Head Circumference --      Peak Flow --      Pain Score 11/18/19 1353 6     Pain Loc --      Pain Edu? --      Excl. in [place]? --    No data found.  Updated Vital Signs BP (!) 126/97 (BP Location: Right Arm)   Pulse 86   Temp 98.1 F (36.7 C) (Oral)   Resp 18   SpO2 99%   Visual Acuity Right Eye Distance:   Left Eye Distance:   Bilateral Distance:    Right Eye Near:   Left Eye Near:    Bilateral Near:     Physical Exam Vitals and nursing note reviewed.  Constitutional:      Appearance: Normal appearance.  HENT:     Head: Atraumatic.     Right Ear: Ear canal normal.     Left Ear: Ear canal normal.     Ears:     Comments: B/l middle ear effusion, no erythema or purulent drainage present    Nose:     Comments: Nasal mucosa erythematous and edematous    Mouth/Throat:     Mouth: Mucous membranes are moist.     Pharynx: Posterior oropharyngeal erythema present.  Eyes:     Extraocular Movements: Extraocular movements intact.     Conjunctiva/sclera: Conjunctivae normal.  Cardiovascular:     Rate and  Rhythm: Normal rate and regular rhythm.  Pulmonary:     Effort: Pulmonary effort is normal.  Breath sounds: Normal breath sounds. No wheezing or rales.  Abdominal:     General: Bowel sounds are normal. There is no distension.     Palpations: Abdomen is soft.     Tenderness: There is no abdominal tenderness. There is no guarding.  Musculoskeletal:        General: Normal range of motion.     Cervical back: Normal range of motion and neck supple.  Skin:    General: Skin is warm and dry.  Neurological:     General: No focal deficit present.     Mental Status: He is oriented to person, place, and time.  Psychiatric:        Mood and Affect: Mood normal.        Thought Content: Thought content normal.        Judgment: Judgment normal.     UC Treatments / Results  Labs (all labs ordered are listed, but only abnormal results are displayed) Labs Reviewed - No data to display  EKG   Radiology No results found.  Procedures Procedures (including critical care time)  Medications Ordered in UC Medications - No data to display  Initial Impression / Assessment and Plan / UC Course  I have reviewed the triage vital signs and the nursing notes.  Pertinent labs & imaging results that were available during my care of the patient were reviewed by me and considered in my medical decision making (see chart for details).     Defer further Pulmonary workup and management to outpatient Pulmonary provider, continue albuterol inhaler prn in meantime. EKG and vital signs reassuring today with no concerning new findings. Strong suspicion much of his current symptoms are coming from poorly controlled allergies. Start zyrtec, flonase, and will do 3 day prednisone burst for effusion. F/u if worsening or not resolving.   Final Clinical Impressions(s) / UC Diagnoses   Final diagnoses:  Otitis media with effusion, bilateral  Seasonal allergic rhinitis due to other allergic trigger   Discharge  Instructions   None    ED Prescriptions    Medication Sig Dispense Auth. Provider   predniSONE (DELTASONE) 50 MG tablet Take 1 tab daily for 3 days with breakfast 3 tablet Volney American, PA-C   cetirizine (ZYRTEC ALLERGY) 10 MG tablet Take 1 tablet (10 mg total) by mouth daily. 30 tablet Volney American, PA-C   fluticasone [facility]) 50 MCG/ACT nasal spray Place 1 spray into both nostrils 2 (two) times daily. 16 g Volney American, [place]     PDMP not reviewed this encounter.   Volney American, [place] 11/18/19 [phone]

## 2019-11-18 NOTE — ED Triage Notes (Signed)
Pt c/o central and left sternal pain, pain to back, nausea for several days. Pt states he has h/o CP and back pain and left arm chronically and denies any change in characteristic. States he is chronically SOB.   Reports new onset right ear pain for approx 3-4 days.   Also reports nausea for past several days.   Denies diaphoresis, dizziness.  Able to speak full sentences w/o difficulty. EKG performed and results given to Dr. Mannie Stabile who advised that pt can return to waiting room and be further evaluated based on time of arrival.

## 2019-11-20 ENCOUNTER — Ambulatory Visit: Payer: PRIVATE HEALTH INSURANCE | Admitting: Physical Therapy

## 2019-11-21 ENCOUNTER — Encounter: Payer: Self-pay | Admitting: Physical Therapy

## 2019-11-21 ENCOUNTER — Other Ambulatory Visit: Payer: Self-pay

## 2019-11-21 ENCOUNTER — Ambulatory Visit: Payer: PRIVATE HEALTH INSURANCE | Attending: Family Medicine | Admitting: Physical Therapy

## 2019-11-21 DIAGNOSIS — M545 Low back pain, unspecified: Secondary | ICD-10-CM | POA: Diagnosis present

## 2019-11-21 DIAGNOSIS — M542 Cervicalgia: Secondary | ICD-10-CM | POA: Diagnosis present

## 2019-11-21 DIAGNOSIS — R252 Cramp and spasm: Secondary | ICD-10-CM | POA: Diagnosis present

## 2019-11-21 NOTE — Therapy (Signed)
[place]. [place], [place], [address] Phone: [phone]   Fax:  [phone]  Physical Therapy Treatment  Patient Details  Name: Nathan [name] MRN: [id number] Date of Birth: [birth year] Referring Provider (PT): Vassie Moment   Encounter Date: 11/21/2019   PT End of Session - 11/21/19 0956    Visit Number 7    Date for PT Re-Evaluation 12/24/19    PT Start Time 0915    PT Stop Time 1000    PT Time Calculation (min) 45 min    Activity Tolerance Patient tolerated treatment well    Behavior During Therapy [facility] for tasks assessed/performed           Past Medical History:  Diagnosis Date  . Arthritis   . Asthma    'I think I might have asthma"  . Chronic back pain   . Dyspnea    chronic  . Hyperlipidemia   . Sarcoidosis     History reviewed. No pertinent surgical history.  There were no vitals filed for this visit.   Subjective Assessment - 11/21/19 0914    Subjective Patient reports that he feels worse after the PT treatment, he did go to the ED due to ear pain last week.    Currently in Pain? Yes    Pain Score 9     Pain Location Neck    Aggravating Factors  worse after PT    Pain Relieving Factors reports nothing really helps                             [facility] Adult PT Treatment/Exercise - 11/21/19 0001      Neck Exercises: Machines for Strengthening   UBE (Upper Arm Bike) L4 x 2 min each     Nustep level 4 x 6 minutes    Cybex Row 15# 2x10    Cybex Chest Press 10# 2x10    Lat Pull 20# 2x10      Neck Exercises: Theraband   Shoulder External Rotation 20 reps;Red      Neck Exercises: Standing   Other Standing Exercises W backs very tight in R shoulder     Other Standing Exercises 5# shoulder shrugs with upper trap and levaotr stretches      Manual Therapy   Manual Therapy Soft tissue mobilization    Soft tissue mobilization right upper trap and cervical area             Trigger Point Dry Needling - 11/21/19 0001    Consent Given? Yes    Education Handout Provided Yes    Muscles Treated Head and Neck Upper trapezius;Levator scapulae    Upper Trapezius Response Twitch reponse elicited    Levator Scapulae Response Twitch response elicited                  PT Short Term Goals - 10/29/19 1347      PT SHORT TERM GOAL #1   Title independent with initial HEP    Status Partially Met             PT Long Term Goals - 11/21/19 0959      PT LONG TERM GOAL #1   Title understand posture and body mechanics    Status Partially Met      PT LONG TERM GOAL #2   Title decrease pain 50%    Status On-going  PT LONG TERM GOAL #3   Title increase cervical ROM 25%    Status Achieved                 Plan - 11/21/19 0957    Clinical Impression Statement Patient continues with high rating of pain and that he will be worse after PT for a few hours.  Tried DN today, could try traction next visit to again try to work on helping pain levels.  He is very tight with spasms in the upper traps and the neck area    PT Next Visit Plan see if he had any relief with DN, could try cervical traction    Consulted and Agree with Plan of Care Patient           Patient will benefit from skilled therapeutic intervention in order to improve the following deficits and impairments:  Abnormal gait, Decreased activity tolerance, Decreased range of motion, Increased muscle spasms, Pain, Improper body mechanics, Impaired flexibility, Postural dysfunction, Decreased strength  Visit Diagnosis: Acute bilateral low back pain without sciatica  Cervicalgia  Cramp and spasm     Problem List Patient Active Problem List   Diagnosis Date Noted  . COVID-19 virus infection 10/21/2019  . Shoulder pain, left 10/23/2016  . Other chest pain 08/22/2014  . Upper airway cough syndrome 07/23/2014  . Sarcoidosis 03/06/2011    Sumner Boast., PT 11/21/2019,  9:59 AM  [place]. [place], [place], [address] Phone: [phone]   Fax:  [phone]  Name: Malyk Girouard MRN: [id number] Date of Birth: [birth year]

## 2019-11-21 NOTE — Patient Instructions (Signed)

## 2019-11-24 ENCOUNTER — Inpatient Hospital Stay (HOSPITAL_COMMUNITY)
Admission: RE | Admit: 2019-11-24 | Discharge: 2019-11-24 | Disposition: A | Discharge status: Home / Self Care | Payer: PRIVATE HEALTH INSURANCE | Source: Ambulatory Visit

## 2019-11-24 NOTE — Progress Notes (Signed)
Patient tested positive on 10/08/2019, results in [place].  Per current protocol, patient not retested within the last 90 days.  Patient notified.

## 2019-11-27 ENCOUNTER — Encounter: Payer: PRIVATE HEALTH INSURANCE | Admitting: Emergency Medicine

## 2019-11-27 ENCOUNTER — Other Ambulatory Visit: Payer: Self-pay

## 2019-11-27 ENCOUNTER — Telehealth: Payer: Self-pay | Admitting: Emergency Medicine

## 2019-11-27 DIAGNOSIS — D869 Sarcoidosis, unspecified: Secondary | ICD-10-CM

## 2019-11-27 NOTE — Telephone Encounter (Signed)
Called and spoke with patient to get his 1 month follow up scheduled from power outage and he stated that the inhaler called into pharmacy was to expensive at $200.  Dr. Lamonte Sakai please advise on alternative or if you would like for [place] to refer him to community health and wellness or patient assistance paperwork due to him not having insurance

## 2019-11-27 NOTE — Telephone Encounter (Signed)
ATC- number listed gave me the msg "call rejected"- will try back on 11/28/19

## 2019-11-27 NOTE — Telephone Encounter (Signed)
Community health and wellness may be the best approach. Would be ok with signing forms for possible assistance from pharmaceutical company if this is an option.

## 2019-11-28 ENCOUNTER — Telehealth: Payer: Self-pay | Admitting: Emergency Medicine

## 2019-11-28 NOTE — Telephone Encounter (Signed)
Patient is returning phone call. Patient phone number is [phone]. Patient would like another inhaler due to too expensive at pharmacy. Pharmacy is [facility]. May leave detailed message on phone.

## 2019-11-28 NOTE — Telephone Encounter (Signed)
ATC again and was able to get trough this time but pt did not answer and no VM set up  Will call back once more and then close per protocol

## 2019-11-28 NOTE — Telephone Encounter (Signed)
Please refer to encounter from 10/14.

## 2019-11-28 NOTE — Telephone Encounter (Signed)
Called and spoke with pt letting him know that [place] said the best approach with meds is to refer him to community health and wellness for med management. Pt verbalized understanding. Referral has been placed. Nothing further needed.

## 2019-12-02 ENCOUNTER — Encounter (HOSPITAL_COMMUNITY): Payer: Self-pay

## 2019-12-02 ENCOUNTER — Encounter: Payer: Self-pay | Admitting: Physical Therapy

## 2019-12-02 ENCOUNTER — Ambulatory Visit (HOSPITAL_COMMUNITY)
Admission: EM | Admit: 2019-12-02 | Discharge: 2019-12-02 | Disposition: A | Discharge status: Home / Self Care | Payer: Self-pay

## 2019-12-02 ENCOUNTER — Ambulatory Visit: Payer: PRIVATE HEALTH INSURANCE | Admitting: Physical Therapy

## 2019-12-02 ENCOUNTER — Other Ambulatory Visit: Payer: Self-pay

## 2019-12-02 DIAGNOSIS — M542 Cervicalgia: Secondary | ICD-10-CM

## 2019-12-02 DIAGNOSIS — R252 Cramp and spasm: Secondary | ICD-10-CM

## 2019-12-02 DIAGNOSIS — M545 Low back pain, unspecified: Secondary | ICD-10-CM

## 2019-12-02 DIAGNOSIS — H9203 Otalgia, bilateral: Secondary | ICD-10-CM

## 2019-12-02 NOTE — ED Provider Notes (Signed)
[facility]    CSN: [id number] Arrival date & time: 12/02/19  1044      History   Chief Complaint Chief Complaint  Patient presents with  . Otalgia    HPI Nathan [name] is a 49 y.o. male.   Pt is a 49 year old male that presents today with bilateral ear pain.  Was seen here approximate 2 weeks ago and prescribed Flonase and prednisone which she reports seems to help some.  This problem comes and goes.  Also having some mild jaw pain at times.  2 nasal sprays were without much relief.  Denies any nasal congestion, rhinorrhea, fever, chills, cough, sore throat.  Also has multiple dental caries and knows he needs to see a dentist. No gingival swelling. iburpofen helps at times.       Past Medical History:  Diagnosis Date  . Arthritis   . Asthma    'I think I might have asthma"  . Chronic back pain   . Dyspnea    chronic  . Hyperlipidemia   . Sarcoidosis     Patient Active Problem List   Diagnosis Date Noted  . COVID-19 virus infection 10/21/2019  . Shoulder pain, left 10/23/2016  . Other chest pain 08/22/2014  . Upper airway cough syndrome 07/23/2014  . Sarcoidosis 03/06/2011    History reviewed. No pertinent surgical history.     Home Medications    Prior to Admission medications   Medication Sig Start Date End Date Taking? Authorizing Provider  aspirin 81 MG chewable tablet Chew 81 mg by mouth daily.   Yes [provider]  cetirizine (ZYRTEC ALLERGY) 10 MG tablet Take 1 tablet (10 mg total) by mouth daily. 11/18/19  Yes Volney American, PA-C  fluticasone [facility]) 50 MCG/ACT nasal spray Place 1 spray into both nostrils 2 (two) times daily. 11/18/19  Yes Volney American, PA-C  rosuvastatin (CRESTOR) 5 MG tablet Take 5 mg by mouth daily.   Yes [provider]  albuterol (VENTOLIN HFA) 108 (90 Base) MCG/ACT inhaler Inhale 2 puffs into the lungs every 6 (six) hours as needed for wheezing or shortness of breath.  08/27/19   Collene Gobble, MD  benzonatate (TESSALON) 100 MG capsule Take 1-2 capsules (100-200 mg total) by mouth 3 (three) times daily as needed. 10/26/19   Jaynee Eagles, PA-C  ibuprofen (ADVIL) 600 MG tablet Take 1 tablet (600 mg total) by mouth every 6 (six) hours as needed. 10/08/19   Melynda Ripple, MD  ipratropium (ATROVENT) 0.06 % nasal spray Place 2 sprays into both nostrils 4 (four) times daily as needed for rhinitis. 10/19/19   Zigmund Gottron, NP  naproxen (NAPROSYN) 500 MG tablet Take 1 tablet (500 mg total) by mouth 2 (two) times daily with a meal. 10/26/19   Jaynee Eagles, PA-C  promethazine-dextromethorphan (PROMETHAZINE-DM) 6.25-15 MG/5ML syrup Take 5 mLs by mouth at bedtime as needed for cough. 10/26/19   Jaynee Eagles, PA-C  Spacer/Aero-Holding Chambers (AEROCHAMBER PLUS) inhaler Use as instructed 10/08/19   Melynda Ripple, MD  tiZANidine (ZANAFLEX) 4 MG tablet Take 1 tablet (4 mg total) by mouth every 8 (eight) hours as needed. 10/26/19   Jaynee Eagles, PA-C  Vitamin D, Ergocalciferol, (DRISDOL) 1.25 MG (50000 UNIT) CAPS capsule Take 50,000 Units by mouth every 7 (seven) days.     [provider]  famotidine (PEPCID) 20 MG tablet Take 20 mg by mouth 2 (two) times daily. Patient not taking: Reported on 09/19/2019  10/08/19  [provider]    Family History Family History  Problem Relation Age of Onset  . Heart disease Maternal Grandfather   . Heart disease Paternal Uncle   . Prostate cancer Paternal Uncle   . Hypertension Mother   . Diabetes Mother   . Heart attack Father   . Diabetes Father   . Hypertension Father   . Colon polyps Sister   . Colon cancer Neg Hx   . Esophageal cancer Neg Hx   . Rectal cancer Neg Hx   . Stomach cancer Neg Hx     Social History Social History   Tobacco Use  . Smoking status: Former Smoker    Packs/day: 1.00    Years: 20.00    Pack years: 20.00    Types: Cigarettes    Quit date: 01/13/2011    Years since quitting: 8.8    . Smokeless tobacco: Never Used  Vaping Use  . Vaping Use: Never used  Substance Use Topics  . Alcohol use: No    Alcohol/week: 0.0 standard drinks  . Drug use: No     Allergies   Percocet [oxycodone-acetaminophen] and Vicodin [hydrocodone-acetaminophen]   Review of Systems Review of Systems   Physical Exam Triage Vital Signs ED Triage Vitals  Enc Vitals Group     BP 12/02/19 1145 (!) 141/89     Pulse Rate 12/02/19 1145 84     Resp 12/02/19 1145 18     Temp 12/02/19 1145 98.5 F (36.9 C)     Temp Source 12/02/19 1145 Oral     SpO2 12/02/19 1145 99 %     Weight --      Height --      Head Circumference --      Peak Flow --      Pain Score 12/02/19 1141 8     Pain Loc --      Pain Edu? --      Excl. in [place]? --    No data found.  Updated Vital Signs BP (!) 141/89 (BP Location: Left Arm)   Pulse 84   Temp 98.5 F (36.9 C) (Oral)   Resp 18   SpO2 99%   Visual Acuity Right Eye Distance:   Left Eye Distance:   Bilateral Distance:    Right Eye Near:   Left Eye Near:    Bilateral Near:     Physical Exam Vitals and nursing note reviewed.  Constitutional:      Appearance: Normal appearance.  HENT:     Head: Normocephalic and atraumatic.     Right Ear: Tympanic membrane and ear canal normal.     Left Ear: Tympanic membrane and ear canal normal.     Nose: Nose normal.     Mouth/Throat:     Comments: multiple dental caries and decay  Eyes:     Conjunctiva/sclera: Conjunctivae normal.  Pulmonary:     Effort: Pulmonary effort is normal.  Musculoskeletal:        General: Normal range of motion.     Cervical back: Normal range of motion.  Skin:    General: Skin is warm and dry.  Neurological:     Mental Status: He is alert.  Psychiatric:        Mood and Affect: Mood normal.      UC Treatments / Results  Labs (all labs ordered are listed, but only abnormal results are displayed) Labs Reviewed - No data to display  EKG   Radiology No results  found.  Procedures Procedures (including critical care time)  Medications Ordered in UC Medications - No data to display  Initial Impression / Assessment and Plan / UC Course  I have reviewed the triage vital signs and the nursing notes.  Pertinent labs & imaging results that were available during my care of the patient were reviewed by me and considered in my medical decision making (see chart for details).     Bilateral ear pain Most likely eustachian tube dysfunction.  Recommended continue to Flonase.  Follow-up with ear nose and throat specialist as needed.  Ibuprofen as needed No concern for infection at this time Final Clinical Impressions(s) / UC Diagnoses   Final diagnoses:  Ear pain, bilateral     Discharge Instructions     No concerns on exam Keep using the Flonase.  Ibuprofen as needed Recommend see the ENT for further issues     ED Prescriptions    None     PDMP not reviewed this encounter.   Orvan July, NP 12/02/19 1213

## 2019-12-02 NOTE — ED Triage Notes (Signed)
Patient in with c/o or bilateral ear pain. Was seen approx 2 weeks ago but states that the pain is still persistent but is intermittent. Also c/o jaw pain and nausea   Patient took nasal spray this morning with minimal relief  States he was prescribed prednisone and Flonase and has finished the prednisone.  Denies fever, vomiting, diarrhea, nasal drainage, congestion

## 2019-12-02 NOTE — Therapy (Signed)
[facility] [place], [place], [address] Phone: [phone]   Fax:  [phone]  Physical Therapy Treatment  Patient Details  Name: Nathan [name] MRN: [id number] Date of Birth: 1970/09/23 Referring Provider (PT): Vassie Moment   Encounter Date: 12/02/2019   PT End of Session - 12/02/19 0904    Visit Number 8    Date for PT Re-Evaluation 12/24/19    PT Start Time 0842    PT Stop Time 0924    PT Time Calculation (min) 42 min    Activity Tolerance Patient limited by pain    Behavior During Therapy [facility] for tasks assessed/performed           Past Medical History:  Diagnosis Date  . Arthritis   . Asthma    'I think I might have asthma"  . Chronic back pain   . Dyspnea    chronic  . Hyperlipidemia   . Sarcoidosis     History reviewed. No pertinent surgical history.  There were no vitals filed for this visit.   Subjective Assessment - 12/02/19 0844    Subjective Patient reports that he has continued to have pain, reports that he has called MD and would like to get an MRI to make sure nothing "big is wrong", he sees the MD November 10th    Currently in Pain? Yes    Pain Score 9     Pain Location Back    Pain Orientation Upper    Pain Descriptors / Indicators Sore    Pain Relieving Factors reports that nothing really helps                             [facility] Adult PT Treatment/Exercise - 12/02/19 0001      Neck Exercises: Machines for Strengthening   UBE (Upper Arm Bike) L4 x 2 min each     Nustep level 5 x 6 minutes    Cybex Row 15# 2x10    Cybex Chest Press 10# 2x10    Lat Pull 20# 2x10      Neck Exercises: Theraband   Shoulder External Rotation 20 reps;Red      Neck Exercises: Supine   Other Supine Exercise feet on ball K2C, trunk rotation, isometric abs      Modalities   Modalities Traction      Traction   Type of Traction Cervical    Max (lbs) 12    Hold Time  static    Time 13                    PT Short Term Goals - 10/29/19 1347      PT SHORT TERM GOAL #1   Title independent with initial HEP    Status Partially Met             PT Long Term Goals - 12/02/19 0914      PT LONG TERM GOAL #1   Title understand posture and body mechanics    Status Partially Met      PT LONG TERM GOAL #2   Title decrease pain 50%    Status On-going                 Plan - 12/02/19 0909    Clinical Impression Statement Patient continues to c/o a high level of pain, he does try all exercises that I ask he reports that  this weekend and over the past week he has not done much due to the pain, I explained that sometimes that is the wrong thing to do because now you are not moving and getting tighter and weaker.  He reports that he has called the MD to get an MRI    PT Next Visit Plan tried traction today, he is going to see if he can see the MD earlier.    Consulted and Agree with Plan of Care Patient           Patient will benefit from skilled therapeutic intervention in order to improve the following deficits and impairments:  Abnormal gait, Decreased activity tolerance, Decreased range of motion, Increased muscle spasms, Pain, Improper body mechanics, Impaired flexibility, Postural dysfunction, Decreased strength  Visit Diagnosis: Acute bilateral low back pain without sciatica  Cervicalgia  Cramp and spasm     Problem List Patient Active Problem List   Diagnosis Date Noted  . COVID-19 virus infection 10/21/2019  . Shoulder pain, left 10/23/2016  . Other chest pain 08/22/2014  . Upper airway cough syndrome 07/23/2014  . Sarcoidosis 03/06/2011    Sumner Boast., PT 12/02/2019, 9:15 AM  [facility] [place], [place], [address] Phone: [phone]   Fax:  [phone]  Name: Nathan [name] MRN: [id number] Date of Birth: [birth year]

## 2019-12-02 NOTE — Discharge Instructions (Signed)
No concerns on exam Keep using the Flonase.  Ibuprofen as needed Recommend see the ENT for further issues

## 2019-12-26 ENCOUNTER — Encounter: Payer: Self-pay | Admitting: Family Medicine

## 2019-12-26 ENCOUNTER — Other Ambulatory Visit: Payer: Self-pay

## 2019-12-26 ENCOUNTER — Telehealth: Payer: Self-pay | Admitting: Emergency Medicine

## 2019-12-26 ENCOUNTER — Ambulatory Visit: Payer: 59 | Attending: Family Medicine | Admitting: Family Medicine

## 2019-12-26 VITALS — BP 132/87 | HR 79 | Ht 69.37 in | Wt 232.2 lb

## 2019-12-26 DIAGNOSIS — M545 Low back pain, unspecified: Secondary | ICD-10-CM | POA: Diagnosis not present

## 2019-12-26 DIAGNOSIS — M542 Cervicalgia: Secondary | ICD-10-CM | POA: Diagnosis not present

## 2019-12-26 DIAGNOSIS — D869 Sarcoidosis, unspecified: Secondary | ICD-10-CM | POA: Diagnosis not present

## 2019-12-26 DIAGNOSIS — E66811 Obesity, class 1: Secondary | ICD-10-CM

## 2019-12-26 DIAGNOSIS — J309 Allergic rhinitis, unspecified: Secondary | ICD-10-CM

## 2019-12-26 DIAGNOSIS — E785 Hyperlipidemia, unspecified: Secondary | ICD-10-CM

## 2019-12-26 DIAGNOSIS — G8929 Other chronic pain: Secondary | ICD-10-CM

## 2019-12-26 DIAGNOSIS — E669 Obesity, unspecified: Secondary | ICD-10-CM

## 2019-12-26 MED ORDER — FLUTICASONE PROPIONATE 50 MCG/ACT NA SUSP: 1.0000 | Freq: Two times a day (BID) | NASAL | 5 refills | Status: DC

## 2019-12-26 MED ORDER — IPRATROPIUM BROMIDE 0.06 % NA SOLN: 2.0000 | Freq: Four times a day (QID) | NASAL | 5 refills | Status: DC | PRN

## 2019-12-26 MED ORDER — ALBUTEROL SULFATE HFA 108 (90 BASE) MCG/ACT IN AERS: 2.0000 | INHALATION_SPRAY | Freq: Four times a day (QID) | RESPIRATORY_TRACT | 5 refills | Status: DC | PRN

## 2019-12-26 MED ORDER — ROSUVASTATIN CALCIUM 5 MG PO TABS: 5.0000 mg | ORAL_TABLET | Freq: Every day | ORAL | 5 refills | Status: DC

## 2019-12-26 MED ORDER — DICLOFENAC SODIUM 75 MG PO TBEC: 75.0000 mg | DELAYED_RELEASE_TABLET | Freq: Two times a day (BID) | ORAL | 2 refills | Status: DC

## 2019-12-26 MED ORDER — CYCLOBENZAPRINE HCL 10 MG PO TABS: 10.0000 mg | ORAL_TABLET | Freq: Every day | ORAL | 1 refills | Status: DC
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
## 2019-12-26 NOTE — Progress Notes (Signed)
ESTABLISH CARE DISCUSS BACK PAIN FROM NECK DOWN

## 2019-12-26 NOTE — Patient Instructions (Signed)
Sarcoidosis  Sarcoidosis is a disease that can cause inflammation in many areas of the body. It most often affects the lungs (pulmonary sarcoidosis). Sarcoidosis can also affect the lymph nodes, liver, eyes, skin, heart, or any other body tissue. Normally, cells that are part of your body's disease-fighting system (immune system) attack harmful substances (such as germs) in your body. This immune system response causes inflammation. After the harmful substance is destroyed, the inflammation and the immune cells go away. When you have sarcoidosis, your immune system causes inflammation even when there are no harmful substances, and the inflammation does not go away. Sarcoidosis also causes cells from your immune system to form small clumps of tissue (granulomas) in the affected area of your body. What are the causes? The exact cause of sarcoidosis is not known.  It is possible that if you have a family history of this disease (genetic predisposition), the immune system response that leads to inflammation may be triggered by something in your environment, such as:  Bacteria or viruses.  Metals.  Chemicals.  Dust.  Mold or mildew. What increases the risk? You may be at a greater risk for sarcoidosis if you:  Have a family history of the disease.  Are African-American.  Are of Northern European descent.  Are 38-70 years old.  Work as a Airline pilot.  Work in an environment where you are exposed to metals, chemicals, mold or mildew, or insecticides. What are the signs or symptoms? Some people with sarcoidosis have no symptoms. Others have very mild symptoms. The symptoms usually depend on the organ that is affected. Sarcoidosis most often affects the lungs, which may include symptoms such as:  Chest pain.  Coughing.  Wheezing.  Shortness of breath. Other common symptoms include:  Night sweats.  Fever.  Weight loss.  Fatigue.  Swollen lymph nodes.  Joint pain. How is  this diagnosed? Sarcoidosis may be diagnosed based on:  Your symptoms and medical history.  A physical exam.  Imaging tests to check for granulomas such as: ? Chest X-ray. ? CT scan. ? MRI. ? PET scan.  Lung function tests. These tests evaluate your breathing and check for problems that may be related to sarcoidosis.  A procedure to remove a tissue sample for testing (biopsy). You may have a biopsy of lung tissue if that is where you are having symptoms. You may have tests to check for any complications of the condition. These tests may include:  Eye exams.  MRI of the heart or brain.  Echocardiogram.  Electrocardiogram (EKG or ECG). How is this treated? In some cases, sarcoidosis does not require a specific treatment because it causes no symptoms or mild symptoms. If your symptoms bother you or are severe, you may be prescribed medicines to reduce inflammation or relieve symptoms. These medicines may include:  Prednisone. This is a steroid that reduces inflammation related to sarcoidosis.  Hydroxychloroquine. This may be used to treat sarcoidosis that affects the skin, eyes, or brain.  Methotrexate, leflunomide, or azathioprine. These medicines affect the immune system and can help with sarcoidosis in the joints, eyes, skin, or lungs.  Medicines that you breathe in (inhalers). Inhalers can help you breathe if sarcoidosis affects your lungs. Follow these instructions at home:   Do not use any products that contain nicotine or tobacco, such as cigarettes and e-cigarettes. If you need help quitting, ask your health care provider.  Avoid secondhand smoke and irritating dust or chemicals. Stay indoors on days when air quality is poor  in your area.  Return to your normal activities as told by your health care provider. Ask your health care provider what activities are safe for you.  Take or use over-the-counter and prescription medicines only as told by your health care  provider.  Keep all follow-up visits as told by your health care provider. This is important. Contact a health care provider if:  You have vision problems.  You have a dry cough that does not go away.  You have an irregular heartbeat.  You have pain or aches in your joints, hands, or feet.  You have an unexplained rash. Get help right away if:  You have chest pain.  You have difficulty breathing. Summary  Sarcoidosis is a disease that can cause inflammation in many body areas of the body. It most often affects the lungs (pulmonary sarcoidosis). It can also affect the lymph nodes, liver, eyes, skin, heart, or any other body tissue.  When you have sarcoidosis, cells from your immune system form small clumps of tissue (granulomas) in the affected area of your body.  Sarcoidosis sometimes does not require a specific treatment because it causes no symptoms or mild symptoms.  If your symptoms bother you or are severe, you may be prescribed medicines to reduce inflammation or relieve symptoms. This information is not intended to replace advice given to you by your health care provider. Make sure you discuss any questions you have with your health care provider. Document Revised: 01/12/2017 Document Reviewed: 11/07/2016 Elsevier Patient Education  2020 [place].  What You Need to Know About Chronic Back Pain Long-term (chronic) back pain is back pain that lasts for 12 weeks or longer. It often affects the lower back and can range from mild to severe. Many people have back pain at some point in their lives. It can feel different to each person. It may feel like a muscle ache or a sharp, stabbing pain. The pain often gets worse over time. It can be difficult to find the cause of chronic back pain. Treating chronic back pain often starts with rest and pain relief, followed by exercises (physical therapy) to strengthen the muscles that support your back. You may have to try different  things to see what works best for you. If other treatments do not help, or if your pain is caused by a condition or an injury, you may need surgery. How can back pain affect me? Chronic back pain is uncomfortable and can make it hard to do your usual daily activities. Chronic back pain can:  Cause numbness and tingling.  Come and go.  Get worse when you are sitting, standing, walking, bending, or lifting.  Affect you while you are active, at rest, or both.  Eventually make it hard to move around.  Occur with fever, weight loss, or difficulty urinating. What are the benefits of treating back pain? Treating chronic back pain may:  Relieve pain.  Keep your pain from getting worse.  Make it easier for you to do your usual activities. What are some steps I can take to decrease my back pain?   Take over-the-counter or prescription medicines only as told by your health care provider.  If directed, apply heat to the affected area. Use the heat source that your health care provider recommends, such as a moist heat pack or a heating pad. ? Place a towel between your skin and the heat source. ? Leave the heat on for 20-30 minutes. ? Remove the heat if your  skin turns bright red. This is especially important if you are unable to feel pain, heat, or cold. You may have a greater risk of getting burned.  If directed, put ice on the affected area: ? Put ice in a plastic bag. ? Place a towel between your skin and the bag. ? Leave the ice on for 20 minutes, 2-3 times a day.  Get regular exercise as told by your health care provider to improve flexibility and strength.  Do not smoke.  Maintain a healthy weight.  When lifting objects: ? Keep your feet as far apart as your shoulders (shoulder-width apart) or farther apart. ? Tighten the muscles in your abdomen. ? Bend your knees and hips and keep your spine neutral. It is important to lift using the strength of your legs, not your back. Do  not lock your knees straight out. ? Always ask for help to lift heavy or awkward objects. What can happen if my back pain goes untreated? Untreated back pain can:  Get worse over time.  Start to occur more often or at different times, such as when you are resting.  Cause posture problems.  Make it hard to move around (limit mobility). Where can I get support? Chronic back pain can be a frustrating condition to manage. It may help to talk with other people who are having a similar experience. Consider joining a support group for people dealing with chronic back pain. Ask your health care provider about support groups in your area. You can also find online and in-person support groups through:  The American Chronic Pain Association: DeluxeOption.si  The U.[place]. Pain Foundation: uspainfoundation.org/support-groups Contact a health care provider if:  Your symptoms do not get better or they get worse.  You have severe back pain.  You have chronic back pain and a fever.  You lose weight without trying.  You have difficulty urinating.  You experience numbness or tingling.  You develop new pain after an injury. Summary  Chronic back pain is often treated with rest, pain relief, and physical therapy.  Get regular exercise to improve your strength and flexibility.  Put heat and ice on the affected areas as directed by your health care provider.  Chronic back pain can be challenging to live with. Joining a support group may help you manage your condition. This information is not intended to replace advice given to you by your health care provider. Make sure you discuss any questions you have with your health care provider. Document Revised: 01/12/2017 Document Reviewed: 10/09/2015 Elsevier Patient Education  2020 [employer].

## 2019-12-29 MED FILL — CYCLOBENZAPRINE 10 MG TAB: 10 | 30 days supply | Qty: 30 | Fill #0

## 2019-12-29 MED FILL — IPRATROPIUM 0.06% SPRAY: 0.06 | 25 days supply | Qty: 15 | Fill #0

## 2019-12-29 MED FILL — FLUTICASONE PROP 50 MCG SPR: 50 | 25 days supply | Qty: 16 | Fill #0

## 2019-12-29 MED FILL — DICLOFENAC SOD EC 75 MG TAB: 75 | 30 days supply | Qty: 60 | Fill #0

## 2019-12-29 MED FILL — ROSUVASTATIN CALCIUM 5 MG T: 5 | 30 days supply | Qty: 30 | Fill #0

## 2019-12-31 ENCOUNTER — Encounter: Payer: Self-pay | Admitting: Emergency Medicine

## 2019-12-31 ENCOUNTER — Other Ambulatory Visit: Payer: Self-pay

## 2019-12-31 ENCOUNTER — Ambulatory Visit (INDEPENDENT_AMBULATORY_CARE_PROVIDER_SITE_OTHER): Payer: Self-pay | Admitting: Emergency Medicine

## 2019-12-31 DIAGNOSIS — D869 Sarcoidosis, unspecified: Secondary | ICD-10-CM

## 2019-12-31 MED ORDER — STIOLTO RESPIMAT 2.5-2.5 MCG/ACT IN AERS: 2.0000 | INHALATION_SPRAY | Freq: Every day | RESPIRATORY_TRACT | 0 refills | Status: DC

## 2019-12-31 NOTE — Progress Notes (Signed)
Subjective:    Patient ID: Nathan [name], male    DOB: October 24, 1970, 49 y.o.   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  Treated with steroids intermittently in the past, not on currently. He did have a pred taper over a month ago from his PCP  He has baseline dyspnea. Has trouble with stairs, with walking an extended distance. Has not had any wheezing, but has heard some in the past. Has some chest and back tightness at times. He has albuterol, uses it a few times a day.   Pulmonary function testing from 6/19 reviewed by me showed grossly normal airflows with decreased function after albuterol, normal lung volumes and normal diffusion capacity.  CT chest 02/12/2017 reviewed by me, shows mediastinal bilateral hilar lymphadenopathy with some subpleural blebbing and scarring in both upper lobes, right lower lobe.  There was a stable 9 mm medial left lower lobe pulmonary nodule.  ROV 10/21/19 --follow-up visit for 49 year old former smoker with clinically diagnosed sarcoid (some question as to whether is skin biopsy 2019 was actually diagnostic, showed granulomatous dermatitis).  He has been treated in the past with prednisone.  I met him in July at which time his ACE level was normal.  We ordered a CT chest and PFT.  Unfortunately since that visit he contracted COVID-19 in 8/25. Was prescribed prednisone, never picked it up to use it. His PFT had to be cancelled. He is still experiencing cough, a lot of R frontal HA and drainage, greenish mucous. No fevers. He has used some albuterol - unsure if it is doing much for him.   CT chest 09/15/2019 reviewed by me, shows symmetric mild bilateral hilar lymphadenopathy, mediastinal adenopathy with a 2.7 cm subcarinal node, unchanged compared with 02/12/2017.   Upper lobe dominant ill-defined nodular scarring, again unchanged.  Left upper lobe nodule 1.3 x 0.8 cm, 9 mm subpleural posterior left lower lobe nodule.  Stable upper lobe predominant paraseptal emphysema.  Chronically elevated right hemidiaphragm.   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  He had COVID-19 pneumonia in August.  CT chest prior to that 09/15/2019 showed stable lymphadenopathy as above, left upper lobe and left lower lobe pulmonary nodules and emphysematous change with a chronically elevated right hemidiaphragm.  He has not yet had pulmonary function testing.  At his last visit he was having some improvement in exertional dyspnea with albuterol so we started Breo, continued albuterol and fluticasone nasal spray.  Today he reports that he was never able to get Breo due to cost, was referred over to [facility] but it was unclear which inhaler he needed so he only has albuterol. He is still having dyspnea with working, walking, cleaning.    Review of Systems As per HPI     Objective:   Physical Exam Vitals:   12/31/19 1532  BP: 124/80  Pulse: 86  Temp: (!) 97.3 F (36.3 C)  SpO2: 98%  Weight: 232 lb 6.4 oz (105.4 kg)  Height: '5\' 7"'  (1.702 m)   Gen: Pleasant, well-nourished, in no distress,  normal affect  ENT: No lesions,  mouth clear,  oropharynx clear, no postnasal drip  Neck: No JVD, no stridor  Lungs:  No use of accessory muscles, no crackles or wheezing on normal respiration, no wheeze on forced expiration  Cardiovascular: RRR, heart sounds normal, no murmur or gallops, no peripheral edema  Musculoskeletal: No deformities, no cyanosis or clubbing  Neuro: alert, awake, non focal  Skin: Warm, dark raised skin inner aspect of each orbit, no nasal lesions seen      Assessment & Plan:   Sarcoidosis Multifactorial dyspnea, with contributions of restriction, some difficulty due to back and flank pain.  Unclear how much true obstruction he has as we do not have any recent PFT.  Additionally he had COVID-19 pneumonia in the interim which could have an impact.  In the short-term I think we need to complete a trial of long-acting bronchodilator, he was unable to get this after our last visit.  We have samples of Stiolto and we will try this.  He can keep albuterol as needed.  We will get his pulmonary function testing to quantify degree of obstruction, restriction.  His most recent CT (prior to Covid) did not show any clear evidence for active sarcoid.  Baltazar Apo, MD, PhD 12/31/2019, 4:04 PM [facility] [phone] or if no answer [phone]

## 2019-12-31 NOTE — Addendum Note (Signed)
Addended by: Gavin Potters R on: 12/31/2019 04:08 PM   Modules accepted: Orders

## 2019-12-31 NOTE — Patient Instructions (Signed)
We will reschedule you pulmonary function testing, perform at your next office visit. Please try starting Stiolto 2 puffs once a day.  This is a maintenance medicine so take it every day to see if it helps her overall breathing over time. Keep your albuterol available to use 2 puffs if needed for shortness of breath, chest tightness, wheezing.  This is a rescue medicine to help you with sudden symptoms. We may decide to repeat your CT scan of the chest in the near future to see if there has been any evidence for active sarcoidosis, any evidence for changes left over from your COVID-19 pneumonia. Follow with Dr. Lamonte Sakai next available with full pulmonary function testing on the same day.

## 2019-12-31 NOTE — Assessment & Plan Note (Signed)
Multifactorial dyspnea, with contributions of restriction, some difficulty due to back and flank pain.  Unclear how much true obstruction he has as we do not have any recent PFT.  Additionally he had COVID-19 pneumonia in the interim which could have an impact.  In the short-term I think we need to complete a trial of long-acting bronchodilator, he was unable to get this after our last visit.  We have samples of Stiolto and we will try this.  He can keep albuterol as needed.  We will get his pulmonary function testing to quantify degree of obstruction, restriction.  His most recent CT (prior to Covid) did not show any clear evidence for active sarcoid.

## 2020-01-01 ENCOUNTER — Telehealth: Payer: Self-pay | Admitting: Emergency Medicine

## 2020-01-01 NOTE — Telephone Encounter (Signed)
Called and spoke to patient, who is questioning if he can take stiolto and prednisone together.  He is aware that these two medications can be taken together.  Patient voiced his understanding and had no further questions.  Nothing further needed.

## 2020-01-01 NOTE — Telephone Encounter (Signed)
Pt returning missed call. 

## 2020-01-01 NOTE — Telephone Encounter (Signed)
ATC patient x2- unknown person is answering and then ending call when asked to speak to patient.

## 2020-01-02 NOTE — Telephone Encounter (Signed)
error 

## 2020-01-18 ENCOUNTER — Encounter (HOSPITAL_COMMUNITY): Payer: Self-pay | Admitting: *Deleted

## 2020-01-18 ENCOUNTER — Ambulatory Visit (HOSPITAL_COMMUNITY)
Admission: EM | Admit: 2020-01-18 | Discharge: 2020-01-18 | Disposition: A | Discharge status: Home / Self Care | Payer: Self-pay

## 2020-01-18 ENCOUNTER — Other Ambulatory Visit: Payer: Self-pay

## 2020-01-18 DIAGNOSIS — M545 Low back pain, unspecified: Secondary | ICD-10-CM

## 2020-01-18 DIAGNOSIS — G8929 Other chronic pain: Secondary | ICD-10-CM

## 2020-01-18 DIAGNOSIS — J069 Acute upper respiratory infection, unspecified: Secondary | ICD-10-CM

## 2020-01-18 NOTE — Discharge Instructions (Signed)
Take ibuprofen and plain over-the-counter Mucinex as directed.    Follow up with your primary care provider if your symptoms are not improving.

## 2020-01-18 NOTE — ED Provider Notes (Signed)
[facility]    CSN: [id number] Arrival date & time: 01/18/20  1014      History   Chief Complaint Chief Complaint  Patient presents with  . Cough    Productive  . Back Pain    HPI Nathan [name] is a 49 y.o. male.   Patient presents with postnasal drip and sinus congestion x1 day.  No treatments attempted at home.  He also reports lower back pain which he attributes to lifting and moving objects at work.  He denies fever, chills, sore throat, shortness of breath, vomiting, diarrhea, or other symptoms.  His medical history includes sarcoidosis, asthma, chronic dyspnea, chronic back pain, neck pain, arthritis.  He is followed by pulmonology for his sarcoidosis and was last seen on 12/31/2019.  The history is provided by the patient and medical records.    Past Medical History:  Diagnosis Date  . Arthritis   . Asthma    'I think I might have asthma"  . Chronic back pain   . Dyspnea    chronic  . Hyperlipidemia   . Sarcoidosis     Patient Active Problem List   Diagnosis Date Noted  . COVID-19 virus infection 10/21/2019  . Shoulder pain, left 10/23/2016  . Other chest pain 08/22/2014  . Upper airway cough syndrome 07/23/2014  . Sarcoidosis 03/06/2011    History reviewed. No pertinent surgical history.     Home Medications    Prior to Admission medications   Medication Sig Start Date End Date Taking? Authorizing Provider  cetirizine (ZYRTEC) 10 MG tablet Take 10 mg by mouth daily. 01/15/20  Yes [provider]  fluticasone (FLONASE) 50 MCG/ACT nasal spray Place 1 spray into both nostrils 2 (two) times daily. 12/26/19  Yes Fulp, Cammie, MD  ipratropium (ATROVENT) 0.06 % nasal spray Place 2 sprays into both nostrils 4 (four) times daily as needed for rhinitis. 12/26/19  Yes Fulp, Cammie, MD  rosuvastatin (CRESTOR) 5 MG tablet Take 1 tablet (5 mg total) by mouth daily. To lower cholesterol 12/26/19  Yes Fulp, Cammie, MD  Spacer/Aero-Holding  Chambers (AEROCHAMBER PLUS) inhaler Use as instructed 10/08/19  Yes Melynda Ripple, MD  albuterol (VENTOLIN HFA) 108 (90 Base) MCG/ACT inhaler Inhale 2 puffs into the lungs every 6 (six) hours as needed for wheezing or shortness of breath. Patient not taking: Reported on 12/31/2019 12/26/19   Fulp, Ander Gaster, MD  cyclobenzaprine (FLEXERIL) 10 MG tablet Take 1 tablet (10 mg total) by mouth at bedtime. As needed for muscle spasm 12/26/19   Fulp, Cammie, MD  diclofenac (VOLTAREN) 75 MG EC tablet Take 1 tablet (75 mg total) by mouth 2 (two) times daily. As needed for pain; take after eating 12/26/19   Fulp, Cammie, MD  Tiotropium Bromide-Olodaterol (STIOLTO RESPIMAT) 2.5-2.5 MCG/ACT AERS Inhale 2 puffs into the lungs daily. 12/31/19   Collene Gobble, MD  famotidine (PEPCID) 20 MG tablet Take 20 mg by mouth 2 (two) times daily. Patient not taking: Reported on 09/19/2019  10/08/19  [provider]    Family History Family History  Problem Relation Age of Onset  . Heart disease Maternal Grandfather   . Heart disease Paternal Uncle   . Prostate cancer Paternal Uncle   . Hypertension Mother   . Diabetes Mother   . Heart attack Father   . Diabetes Father   . Hypertension Father   . Colon polyps Sister   . Colon cancer Neg Hx   . Esophageal cancer Neg Hx   .  Rectal cancer Neg Hx   . Stomach cancer Neg Hx     Social History Social History   Tobacco Use  . Smoking status: Former Smoker    Packs/day: 1.00    Years: 20.00    Pack years: 20.00    Types: Cigarettes    Quit date: 01/13/2011    Years since quitting: 9.0  . Smokeless tobacco: Never Used  Vaping Use  . Vaping Use: Never used  Substance Use Topics  . Alcohol use: No    Alcohol/week: 0.0 standard drinks  . Drug use: No     Allergies   Percocet [oxycodone-acetaminophen] and Vicodin [hydrocodone-acetaminophen]   Review of Systems Review of Systems  Constitutional: Negative for chills and fever.  HENT: Positive  for congestion and postnasal drip. Negative for ear pain and sore throat.   Eyes: Negative for pain and visual disturbance.  Respiratory: Negative for cough and shortness of breath.   Cardiovascular: Negative for chest pain and palpitations.  Gastrointestinal: Negative for abdominal pain, diarrhea and vomiting.  Genitourinary: Negative for dysuria and hematuria.  Musculoskeletal: Positive for back pain. Negative for arthralgias.  Skin: Negative for color change and rash.  Neurological: Negative for seizures and syncope.  All other systems reviewed and are negative.    Physical Exam Triage Vital Signs ED Triage Vitals  Enc Vitals Group     BP 01/18/20 1032 123/75     Pulse Rate 01/18/20 1032 88     Resp 01/18/20 1032 18     Temp 01/18/20 1032 98.6 F (37 C)     Temp Source 01/18/20 1032 Oral     SpO2 01/18/20 1033 98 %     Weight 01/18/20 1033 228 lb (103.4 kg)     Height 01/18/20 1033 5\' 8"  (1.727 m)     Head Circumference --      Peak Flow --      Pain Score 01/18/20 1032 10     Pain Loc --      Pain Edu? --      Excl. in [place]? --    No data found.  Updated Vital Signs BP 123/75 (BP Location: Left Arm)   Pulse 88   Temp 98.6 F (37 C) (Oral)   Resp 18   Ht 5\' 8"  (1.727 m)   Wt 228 lb (103.4 kg)   SpO2 98%   BMI 34.67 kg/m   Visual Acuity Right Eye Distance:   Left Eye Distance:   Bilateral Distance:    Right Eye Near:   Left Eye Near:    Bilateral Near:     Physical Exam Vitals and nursing note reviewed.  Constitutional:      General: He is not in acute distress.    Appearance: He is well-developed. He is not ill-appearing.  HENT:     Head: Normocephalic and atraumatic.     Right Ear: Tympanic membrane normal.     Left Ear: Tympanic membrane normal.     Nose: Nose normal.     Mouth/Throat:     Mouth: Mucous membranes are moist.     Pharynx: Oropharynx is clear.  Eyes:     Conjunctiva/sclera: Conjunctivae normal.  Cardiovascular:     Rate and  Rhythm: Normal rate and regular rhythm.     Heart sounds: Normal heart sounds.  Pulmonary:     Effort: Pulmonary effort is normal. No respiratory distress.     Breath sounds: Normal breath sounds. No wheezing or rhonchi.  Abdominal:  Palpations: Abdomen is soft.     Tenderness: There is no abdominal tenderness. There is no guarding or rebound.  Musculoskeletal:     Cervical back: Neck supple.  Skin:    General: Skin is warm and dry.     Findings: No rash.  Neurological:     General: No focal deficit present.     Mental Status: He is alert and oriented to person, place, and time.     Gait: Gait normal.  Psychiatric:        Mood and Affect: Mood normal.        Behavior: Behavior normal.      UC Treatments / Results  Labs (all labs ordered are listed, but only abnormal results are displayed) Labs Reviewed - No data to display  EKG   Radiology No results found.  Procedures Procedures (including critical care time)  Medications Ordered in UC Medications - No data to display  Initial Impression / Assessment and Plan / UC Course  I have reviewed the triage vital signs and the nursing notes.  Pertinent labs & imaging results that were available during my care of the patient were reviewed by me and considered in my medical decision making (see chart for details).   Viral URI.  Chronic low back pain.  Instructed patient to take ibuprofen and Mucinex as needed.  Instructed him to follow-up with his PCP if his symptoms are not improving.  Patient agrees to plan of care.   Final Clinical Impressions(s) / UC Diagnoses   Final diagnoses:  Viral upper respiratory tract infection  Chronic bilateral low back pain without sciatica     Discharge Instructions     Take ibuprofen and plain over-the-counter Mucinex as directed.    Follow up with your primary care provider if your symptoms are not improving.        ED Prescriptions    None     I have reviewed the PDMP  during this encounter.   Sharion Balloon, NP 01/18/20 1053

## 2020-01-18 NOTE — ED Triage Notes (Signed)
Pt reports he has sarcoidosis that causes chest tightness and chronic back pain . Pt reports a productive cough and a flare up of chronic back pain after lifting heavy object at work. Pt denies CP.

## 2020-01-20 ENCOUNTER — Ambulatory Visit: Payer: 59 | Admitting: Family Medicine

## 2020-01-20 ENCOUNTER — Ambulatory Visit (INDEPENDENT_AMBULATORY_CARE_PROVIDER_SITE_OTHER): Payer: Self-pay | Admitting: Family Medicine

## 2020-01-20 ENCOUNTER — Other Ambulatory Visit: Payer: Self-pay

## 2020-01-20 DIAGNOSIS — M545 Low back pain, unspecified: Secondary | ICD-10-CM

## 2020-01-20 DIAGNOSIS — M542 Cervicalgia: Secondary | ICD-10-CM

## 2020-01-20 DIAGNOSIS — G8929 Other chronic pain: Secondary | ICD-10-CM

## 2020-01-20 MED ORDER — VITAMIN D-3 125 MCG (5000 UT) PO TABS: 1.0000 | ORAL_TABLET | Freq: Every day | ORAL | 3 refills | Status: DC

## 2020-01-20 NOTE — Patient Instructions (Signed)
    Glucosamine Sulfate:  1,000 mg twice daily  Turmeric:  500 mg twice daily  Vitamin D3:  5,000 IU daily  Minimize intake of sugar
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
## 2020-01-21 ENCOUNTER — Telehealth: Payer: Self-pay | Admitting: Family Medicine

## 2020-01-21 MED ORDER — VITAMIN D-3 125 MCG (5000 UT) PO TABS: 1.0000 | ORAL_TABLET | Freq: Every day | ORAL | 3 refills | Status: DC

## 2020-01-21 NOTE — Telephone Encounter (Signed)
I called the patient to advise him this was done.

## 2020-01-21 NOTE — Telephone Encounter (Signed)
Spoke with patient he advised if the Rx for Vitamin D 3 is less expensive through the pharmacy can the Rx be called into [facility] [address]? The number to contact patient is [phone]

## 2020-01-21 NOTE — Telephone Encounter (Signed)
Sent!

## 2020-01-24 ENCOUNTER — Other Ambulatory Visit: Payer: Self-pay

## 2020-01-24 ENCOUNTER — Encounter (HOSPITAL_COMMUNITY): Payer: Self-pay

## 2020-01-24 ENCOUNTER — Emergency Department (HOSPITAL_COMMUNITY): Payer: Self-pay

## 2020-01-24 ENCOUNTER — Emergency Department (HOSPITAL_COMMUNITY)
Admission: EM | Admit: 2020-01-24 | Discharge: 2020-01-24 | Disposition: A | Discharge status: Home / Self Care | Payer: Self-pay | Attending: Emergency Medicine | Admitting: Emergency Medicine

## 2020-01-24 DIAGNOSIS — M545 Low back pain, unspecified: Secondary | ICD-10-CM | POA: Insufficient documentation

## 2020-01-24 DIAGNOSIS — Z8616 Personal history of COVID-19: Secondary | ICD-10-CM | POA: Insufficient documentation

## 2020-01-24 DIAGNOSIS — Z87891 Personal history of nicotine dependence: Secondary | ICD-10-CM | POA: Insufficient documentation

## 2020-01-24 DIAGNOSIS — M542 Cervicalgia: Secondary | ICD-10-CM | POA: Insufficient documentation

## 2020-01-24 DIAGNOSIS — R0789 Other chest pain: Secondary | ICD-10-CM | POA: Insufficient documentation

## 2020-01-24 DIAGNOSIS — Z7951 Long term (current) use of inhaled steroids: Secondary | ICD-10-CM | POA: Insufficient documentation

## 2020-01-24 DIAGNOSIS — R1310 Dysphagia, unspecified: Secondary | ICD-10-CM | POA: Insufficient documentation

## 2020-01-24 DIAGNOSIS — G8929 Other chronic pain: Secondary | ICD-10-CM | POA: Insufficient documentation

## 2020-01-24 DIAGNOSIS — J45909 Unspecified asthma, uncomplicated: Secondary | ICD-10-CM | POA: Insufficient documentation

## 2020-01-24 LAB — TROPONIN I (HIGH SENSITIVITY): Troponin I (High Sensitivity): 5 ng/L (ref <18)

## 2020-01-24 LAB — BASIC METABOLIC PANEL
Anion gap: 9 (ref 5–15)
BUN: 11 mg/dL (ref 6–20)
CO2: 26 mmol/L (ref 22–32)
Calcium: 9 mg/dL (ref 8.9–10.3)
Chloride: 105 mmol/L (ref 98–111)
Creatinine, Ser: 0.72 mg/dL (ref 0.61–1.24)
GFR, Estimated: >60 mL/min (ref >60)
Glucose, Bld: 105 mg/dL — ABNORMAL HIGH (ref 70–99)
Potassium: 4 mmol/L (ref 3.5–5.1)
Sodium: 140 mmol/L (ref 135–145)

## 2020-01-24 LAB — CBC
HCT: 39.8 % (ref 39.0–52.0)
Hemoglobin: 13.4 g/dL (ref 13.0–17.0)
MCH: 29.1 pg (ref 26.0–34.0)
MCHC: 33.7 g/dL (ref 30.0–36.0)
MCV: 86.5 fL (ref 80.0–100.0)
Platelets: 300 10*3/uL (ref 150–400)
RBC: 4.6 MIL/uL (ref 4.22–5.81)
RDW: 13.2 % (ref 11.5–15.5)
WBC: 4.2 10*3/uL (ref 4.0–10.5)
nRBC: 0 % (ref 0.0–0.2)

## 2020-01-24 NOTE — ED Notes (Signed)
Patient discharged, vetrbalized understanding of DC instructions.  ambulated to [place]   6/ 10 pain  DC instructions reviewed, verbalized understanding

## 2020-01-24 NOTE — ED Provider Notes (Signed)
[facility] EMERGENCY DEPARTMENT Provider Note   CSN: [id number] Arrival date & time: 01/24/20  1015     History No chief complaint on file.   Nathan [name] is a 49 y.o. male.  HPI   Patient is a 49 year old male with a medical history as noted below.  Patient has history of chronic back pain as well as chest pain.  Feels that both have acutely worsened over the past week.  He was evaluated in urgent care for this earlier in the week.  He states that he came to the emergency department today because his chest pain felt different than normal yesterday and he was concerned so he wanted to "get checked out".  Patient states his chest pain is constant and left-sided.  No known modifying factors.  He reports chronic cervical and lumbar spine pain.  Feels that when his back pain worsens, his chest pain also worsens.  He is followed by orthopedics for his chronic back pain.  He was recently evaluated by them and they recommended he start vitamin D, turmeric, and glucosamine.  He has been taking these.  He has MRIs of his cervical spine and lumbar spine scheduled for January.  Patient also has history of sarcoidosis and is followed by pulmonology.  He started Symbicort about 2 weeks ago and states that it is providing mild relief.  They are planning to obtain PFTs at his next office visit.  He reports chronic shortness of breath that is not acutely worsened.  Patient is also complaining of some mild dysphagia.  It is intermittent.  He states that sometimes he feels that he has a hard time swallowing food.  He stopped smoking about 10 years ago.  He used to be a pack per day smoker for many years.  No fevers, abdominal pain, nausea, vomiting, diaphoresis, urinary changes, leg swelling.      Past Medical History:  Diagnosis Date  . Arthritis   . Asthma    'I think I might have asthma"  . Chronic back pain   . Dyspnea    chronic  . Hyperlipidemia   . Sarcoidosis      Patient Active Problem List   Diagnosis Date Noted  . COVID-19 virus infection 10/21/2019  . Shoulder pain, left 10/23/2016  . Other chest pain 08/22/2014  . Upper airway cough syndrome 07/23/2014  . Sarcoidosis 03/06/2011    History reviewed. No pertinent surgical history.     Family History  Problem Relation Age of Onset  . Heart disease Maternal Grandfather   . Heart disease Paternal Uncle   . Prostate cancer Paternal Uncle   . Hypertension Mother   . Diabetes Mother   . Heart attack Father   . Diabetes Father   . Hypertension Father   . Colon polyps Sister   . Colon cancer Neg Hx   . Esophageal cancer Neg Hx   . Rectal cancer Neg Hx   . Stomach cancer Neg Hx     Social History   Tobacco Use  . Smoking status: Former Smoker    Packs/day: 1.00    Years: 20.00    Pack years: 20.00    Types: Cigarettes    Quit date: 01/13/2011    Years since quitting: 9.0  . Smokeless tobacco: Never Used  Vaping Use  . Vaping Use: Never used  Substance Use Topics  . Alcohol use: No    Alcohol/week: 0.0 standard drinks  . Drug use: No  Home Medications Prior to Admission medications   Medication Sig Start Date End Date Taking? Authorizing Provider  albuterol (VENTOLIN HFA) 108 (90 Base) MCG/ACT inhaler Inhale 2 puffs into the lungs every 6 (six) hours as needed for wheezing or shortness of breath. Patient not taking: Reported on 12/31/2019 12/26/19   Fulp, Ander Gaster, MD  cetirizine (ZYRTEC) 10 MG tablet Take 10 mg by mouth daily. 01/15/20   [provider]  Cholecalciferol (VITAMIN D-3) 125 MCG (5000 UT) TABS Take 1 tablet by mouth daily. 01/21/20   Hilts, Legrand Como, MD  cyclobenzaprine (FLEXERIL) 10 MG tablet Take 1 tablet (10 mg total) by mouth at bedtime. As needed for muscle spasm 12/26/19   Fulp, Cammie, MD  diclofenac (VOLTAREN) 75 MG EC tablet Take 1 tablet (75 mg total) by mouth 2 (two) times daily. As needed for pain; take after eating 12/26/19   Fulp,  Cammie, MD  fluticasone (FLONASE) 50 MCG/ACT nasal spray Place 1 spray into both nostrils 2 (two) times daily. 12/26/19   Fulp, Cammie, MD  ibuprofen (ADVIL) 600 MG tablet Take 600 mg by mouth every 6 (six) hours as needed. 01/07/20   [provider]  ibuprofen (ADVIL) 800 MG tablet Take 800 mg by mouth every 8 (eight) hours as needed. 01/07/20   [provider]  ipratropium (ATROVENT) 0.06 % nasal spray Place 2 sprays into both nostrils 4 (four) times daily as needed for rhinitis. 12/26/19   Fulp, Cammie, MD  rosuvastatin (CRESTOR) 5 MG tablet Take 1 tablet (5 mg total) by mouth daily. To lower cholesterol 12/26/19   Antony Blackbird, MD  Spacer/Aero-Holding Chambers (AEROCHAMBER PLUS) inhaler Use as instructed 10/08/19   Melynda Ripple, MD  Tiotropium Bromide-Olodaterol (STIOLTO RESPIMAT) 2.5-2.5 MCG/ACT AERS Inhale 2 puffs into the lungs daily. 12/31/19   Collene Gobble, MD  famotidine (PEPCID) 20 MG tablet Take 20 mg by mouth 2 (two) times daily. Patient not taking: Reported on 09/19/2019  10/08/19  [provider]    Allergies    Percocet [oxycodone-acetaminophen] and Vicodin [hydrocodone-acetaminophen]  Review of Systems   Review of Systems  All other systems reviewed and are negative. Ten systems reviewed and are negative for acute change, except as noted in the HPI.   Physical Exam Updated Vital Signs BP 123/85 (BP Location: Left Arm)   Pulse 69   Temp 98.6 F (37 C) (Oral)   Resp 17   Ht 5\' 8"  (1.727 m)   Wt 105.7 kg   SpO2 99%   BMI 35.43 kg/m   Physical Exam Vitals and nursing note reviewed.  Constitutional:      General: He is not in acute distress.    Appearance: Normal appearance. He is not ill-appearing, toxic-appearing or diaphoretic.  HENT:     Head: Normocephalic and atraumatic.     Right Ear: External ear normal.     Left Ear: External ear normal.     Nose: Nose normal.     Mouth/Throat:     Mouth: Mucous membranes are moist.      Pharynx: Oropharynx is clear. No oropharyngeal exudate or posterior oropharyngeal erythema.  Eyes:     Extraocular Movements: Extraocular movements intact.  Cardiovascular:     Rate and Rhythm: Normal rate and regular rhythm.     Pulses: Normal pulses.     Heart sounds: Normal heart sounds. No murmur heard. No friction rub. No gallop.      Comments: No palpable chest wall pain. No rib pain. No crepitus. Pulmonary:  Effort: Pulmonary effort is normal. No respiratory distress.     Breath sounds: Normal breath sounds. No stridor. No wheezing, rhonchi or rales.     Comments: Lungs are clear to auscultation bilaterally.  No wheezing, rales, or rhonchi. Abdominal:     General: Abdomen is flat.     Tenderness: There is no abdominal tenderness.  Musculoskeletal:        General: Tenderness present. Normal range of motion.     Cervical back: Normal range of motion and neck supple. No tenderness.     Comments: Diffuse tenderness appreciated along the midline cervical spine and lumbar spine.  Additional diffuse tenderness appreciated along the bilateral paraspinal musculature.  Skin:    General: Skin is warm and dry.  Neurological:     General: No focal deficit present.     Mental Status: He is alert and oriented to person, place, and time.     Comments: No gross deficits.  Ambulatory.  Moving all 4 extremities with ease.  Psychiatric:        Mood and Affect: Mood normal.        Behavior: Behavior normal.    ED Results / Procedures / Treatments   Labs (all labs ordered are listed, but only abnormal results are displayed) Labs Reviewed  BASIC METABOLIC PANEL - Abnormal; Notable for the following components:      Result Value   Glucose, Bld 105 (*)    All other components within normal limits  CBC  TROPONIN I (HIGH SENSITIVITY)   EKG EKG Interpretation  Date/Time:  Saturday January 24 2020 10:29:51 EST Ventricular Rate:  80 PR Interval:  200 QRS Duration: 102 QT  Interval:  364 QTC Calculation: 419 R Axis:   4 Text Interpretation: Normal sinus rhythm Minimal voltage criteria for LVH, may be normal variant ( Cornell product ) No significant change since last tracing Confirmed by Blanchie Dessert [phone]) on 01/24/2020 2:58:44 PM   Radiology DG Chest 2 View  Result Date: 01/24/2020 CLINICAL DATA:  LEFT-sided pain.  Food sticking in throat. EXAM: CHEST - 2 VIEW COMPARISON:  Radiograph 10/26/2019 FINDINGS: Normal cardiac silhouette. Chronic elevation RIGHT hemidiaphragm. No effusion, infiltrate pneumothorax. A small nodule in the RIGHT lower lobe corresponds to nodularity seen on comparison CT 09/15/2019. IMPRESSION: No active cardiopulmonary disease. Electronically Signed   By: Suzy Bouchard M.D.   On: 01/24/2020 11:24   Procedures Procedures   Medications Ordered in ED Medications - No data to display  ED Course  I have reviewed the triage vital signs and the nursing notes.  Pertinent labs & imaging results that were available during my care of the patient were reviewed by me and considered in my medical decision making (see chart for details).    MDM Rules/Calculators/A&P                          Patient is a 49 year old male who presents to the emergency department today with multiple acute on chronic complaints.  Patient reports acute on chronic chest pain.  States his symptoms are constant but they felt different yesterday so he came to the emergency department for evaluation.  Left-sided along the lateral ribs.  No modifying factors.  He is not tachycardic.  No hypoxia or acute shortness of breath.  No leg swelling.  Doubt DVT/PE at this time.  Initial troponin obtained and is 5.  Heart score of 3.  Doubt ACS at this time.  Symptoms seem  musculoskeletal in nature.  Possibly pleuritic.  Patient does have history of sarcoidosis but chest x-ray today shows no acute abnormalities.  ECG reassuring.  Patient also complains of chronic back pain.   He has palpable pain in the cervical region as well as the lumbar region.  He is followed by orthopedics and was recently evaluated by orthopedics.  Started on glucosamine, turmeric, and vitamin D.  He has an MRI scheduled in early January.  No gross deficits on my exam.  Recommended follow-up with orthopedics.  Patient also notes some intermittent dysphagia.  He does have a history of smoking but quit about 10 years ago.  Denies it is constant.  Does not note any significant difficulty with eating.  Will provide GI follow-up for possible EGD.  Feel the patient is safe for discharge at this time.  He was discussed with my attending physician Dr. Blanchie Dessert.  Patient is amenable to discharge at this time.  Recommended return to the ER with any new or worsening symptoms.  His questions were answered and he was amicable at the time of discharge.  His vital signs are stable and all within normal limits.  Final Clinical Impression(s) / ED Diagnoses Final diagnoses:  Atypical chest pain  Chronic neck pain  Chronic bilateral low back pain without sciatica   Rx / DC Orders ED Discharge Orders    None       Rayna Sexton, PA-C 01/24/20 1601    Blanchie Dessert, MD 01/24/20 1645

## 2020-01-24 NOTE — ED Triage Notes (Signed)
Patient complains of ongoing chest and back discomfort with phlem stuck in throat. Patient states that he was seen at [facility] last week for same and not feeling any better

## 2020-01-24 NOTE — ED Notes (Signed)
Chief Complaint  Patient presents with  . Back Pain  . Chest Pain    Patient has chronic back and chest pain.   Seen for same [facility] but states it is not better  VSS  10/10 back pain 6/10 CP per patient he thinks it could be from the back pain.   A0x4, per PA no need for repeat trop.

## 2020-01-24 NOTE — Discharge Instructions (Signed)
Below you will find the contact information for [facility].  Please follow-up with them regarding your difficulty swallowing.  They might recommend an upper endoscopy to further evaluate your symptoms.  If your chest pain worsens once again, please return to the emergency department for reevaluation.  Please once again follow-up with your orthopedist regarding your back and neck pain.  It was a pleasure to meet you.

## 2020-02-02 ENCOUNTER — Emergency Department (HOSPITAL_COMMUNITY)
Admission: EM | Admit: 2020-02-02 | Discharge: 2020-02-02 | Disposition: A | Discharge status: Home / Self Care | Payer: Self-pay | Attending: Emergency Medicine | Admitting: Emergency Medicine

## 2020-02-02 ENCOUNTER — Other Ambulatory Visit: Payer: Self-pay

## 2020-02-02 ENCOUNTER — Other Ambulatory Visit: Payer: Self-pay | Admitting: Emergency Medicine

## 2020-02-02 DIAGNOSIS — Z87891 Personal history of nicotine dependence: Secondary | ICD-10-CM | POA: Insufficient documentation

## 2020-02-02 DIAGNOSIS — K029 Dental caries, unspecified: Secondary | ICD-10-CM | POA: Insufficient documentation

## 2020-02-02 DIAGNOSIS — Z7951 Long term (current) use of inhaled steroids: Secondary | ICD-10-CM | POA: Insufficient documentation

## 2020-02-02 DIAGNOSIS — J45909 Unspecified asthma, uncomplicated: Secondary | ICD-10-CM | POA: Insufficient documentation

## 2020-02-02 DIAGNOSIS — Z8616 Personal history of COVID-19: Secondary | ICD-10-CM | POA: Insufficient documentation

## 2020-02-02 MED ORDER — TRAMADOL HCL 50 MG PO TABS: 50.0000 mg | ORAL_TABLET | Freq: Four times a day (QID) | ORAL | 0 refills | Status: DC | PRN

## 2020-02-02 MED ORDER — AMOXICILLIN 500 MG PO CAPS: 500.0000 mg | ORAL_CAPSULE | Freq: Three times a day (TID) | ORAL | 0 refills | Status: DC

## 2020-02-02 MED FILL — AMOXICILLIN 500 MG CAPSULE: 500 | 7 days supply | Qty: 21 | Fill #0

## 2020-02-02 MED FILL — traMADol HCL 50 MG TABS: 50 | 3 days supply | Qty: 15 | Fill #0

## 2020-02-02 NOTE — ED Notes (Signed)
Pt discharged ambulatory. All questions and concerns addressed. No complaints at this time.   

## 2020-02-02 NOTE — Discharge Instructions (Signed)
Call your primary care doctor or specialist as discussed in the next 2-3 days.   Return immediately back to the ER if:  Your symptoms worsen within the next 12-24 hours. You develop new symptoms such as new fevers, persistent vomiting, new pain, shortness of breath, or new weakness or numbness, or if you have any other concerns.  

## 2020-02-02 NOTE — ED Triage Notes (Signed)
C/o toothache

## 2020-02-02 NOTE — ED Provider Notes (Signed)
[name] EMERGENCY DEPARTMENT Provider Note   CSN: [id number] Arrival date & time: 02/02/20  1042     History Chief Complaint  Patient presents with  . Dental Pain    Nathan [name] is a 49 y.o. male.  Patient presents with 3 days of right posterior lower tooth ache.  Denies fevers or cough no vomiting or diarrhea.  He states he has a history of "poor teeth."  He called his dentist and the earliest appointment was next month.  Is been taking diclofenac for various other pains but feels it has not been working for him for his toothache.        Past Medical History:  Diagnosis Date  . Arthritis   . Asthma    'I think I might have asthma"  . Chronic back pain   . Dyspnea    chronic  . Hyperlipidemia   . Sarcoidosis     Patient Active Problem List   Diagnosis Date Noted  . COVID-19 virus infection 10/21/2019  . Shoulder pain, left 10/23/2016  . Other chest pain 08/22/2014  . Upper airway cough syndrome 07/23/2014  . Sarcoidosis 03/06/2011    No past surgical history on file.     Family History  Problem Relation Age of Onset  . Heart disease Maternal Grandfather   . Heart disease Paternal Uncle   . Prostate cancer Paternal Uncle   . Hypertension Mother   . Diabetes Mother   . Heart attack Father   . Diabetes Father   . Hypertension Father   . Colon polyps Sister   . Colon cancer Neg Hx   . Esophageal cancer Neg Hx   . Rectal cancer Neg Hx   . Stomach cancer Neg Hx     Social History   Tobacco Use  . Smoking status: Former Smoker    Packs/day: 1.00    Years: 20.00    Pack years: 20.00    Types: Cigarettes    Quit date: 01/13/2011    Years since quitting: 9.0  . Smokeless tobacco: Never Used  Vaping Use  . Vaping Use: Never used  Substance Use Topics  . Alcohol use: No    Alcohol/week: 0.0 standard drinks  . Drug use: No    Home Medications Prior to Admission medications   Medication Sig Start Date End Date  Taking? Authorizing Provider  ipratropium (ATROVENT) 0.06 % nasal spray Place 2 sprays into both nostrils 4 (four) times daily as needed for rhinitis. 12/26/19  Yes Fulp, Cammie, MD  rosuvastatin (CRESTOR) 5 MG tablet Take 1 tablet (5 mg total) by mouth daily. To lower cholesterol 12/26/19  Yes Fulp, Cammie, MD  Tiotropium Bromide-Olodaterol (STIOLTO RESPIMAT) 2.5-2.5 MCG/ACT AERS Inhale 2 puffs into the lungs daily. 12/31/19  Yes Collene Gobble, MD  albuterol (VENTOLIN HFA) 108 (90 Base) MCG/ACT inhaler Inhale 2 puffs into the lungs every 6 (six) hours as needed for wheezing or shortness of breath. Patient not taking: Reported on 12/31/2019 12/26/19   Fulp, Ander Gaster, MD  amoxicillin (AMOXIL) 500 MG capsule Take 1 capsule (500 mg total) by mouth 3 (three) times daily. 02/02/20   Luna Fuse, MD  cetirizine (ZYRTEC) 10 MG tablet Take 10 mg by mouth daily. 01/15/20   [provider]  Cholecalciferol (VITAMIN D-3) 125 MCG (5000 UT) TABS Take 1 tablet by mouth daily. 01/21/20   Hilts, Legrand Como, MD  cyclobenzaprine (FLEXERIL) 10 MG tablet Take 1 tablet (10 mg total) by mouth at bedtime.  As needed for muscle spasm 12/26/19   Fulp, Cammie, MD  diclofenac (VOLTAREN) 75 MG EC tablet Take 1 tablet (75 mg total) by mouth 2 (two) times daily. As needed for pain; take after eating 12/26/19   Fulp, Cammie, MD  fluticasone (FLONASE) 50 MCG/ACT nasal spray Place 1 spray into both nostrils 2 (two) times daily. 12/26/19   Fulp, Cammie, MD  ibuprofen (ADVIL) 600 MG tablet Take 600 mg by mouth every 6 (six) hours as needed. 01/07/20   [provider]  ibuprofen (ADVIL) 800 MG tablet Take 800 mg by mouth every 8 (eight) hours as needed. 01/07/20   [provider]  Spacer/Aero-Holding Chambers (AEROCHAMBER PLUS) inhaler Use as instructed 10/08/19   Melynda Ripple, MD  traMADol (ULTRAM) 50 MG tablet Take 1 tablet (50 mg total) by mouth every 6 (six) hours as needed. 02/02/20   Luna Fuse, MD   famotidine (PEPCID) 20 MG tablet Take 20 mg by mouth 2 (two) times daily. Patient not taking: Reported on 09/19/2019  10/08/19  [provider]    Allergies    Percocet [oxycodone-acetaminophen] and Vicodin [hydrocodone-acetaminophen]  Review of Systems   Review of Systems  Constitutional: Negative for fever.  HENT: Negative for ear pain and sore throat.   Eyes: Negative for pain.  Respiratory: Negative for cough.   Cardiovascular: Negative for chest pain.  Gastrointestinal: Negative for abdominal pain.  Genitourinary: Negative for flank pain.  Musculoskeletal: Negative for back pain.  Skin: Negative for color change and rash.  Neurological: Negative for syncope.  All other systems reviewed and are negative.   Physical Exam Updated Vital Signs BP 129/83 (BP Location: Right Arm)   Pulse 76   Temp 98.3 F (36.8 C) (Oral)   Resp 16   SpO2 99%   Physical Exam Constitutional:      General: He is not in acute distress.    Appearance: He is well-developed.  HENT:     Head: Normocephalic.     Mouth/Throat:     Mouth: Mucous membranes are moist.     Comments: Multiple dental cavities noted on bilateral upper and lower teeth.  Right molar/premolar tender to palpation.  No gingival or gum abscess or swelling or infection noted. Cardiovascular:     Rate and Rhythm: Normal rate.  Pulmonary:     Effort: Pulmonary effort is normal.  Abdominal:     Palpations: Abdomen is soft.  Musculoskeletal:     Right lower leg: No edema.     Left lower leg: No edema.  Skin:    General: Skin is warm.     Capillary Refill: Capillary refill takes less than 2 seconds.  Neurological:     General: No focal deficit present.     Mental Status: He is alert.     ED Results / Procedures / Treatments   Labs (all labs ordered are listed, but only abnormal results are displayed) Labs Reviewed - No data to display  EKG None  Radiology No results found.  Procedures Procedures  (including critical care time)  Medications Ordered in ED Medications - No data to display  ED Course  I have reviewed the triage vital signs and the nursing notes.  Pertinent labs & imaging results that were available during my care of the patient were reviewed by me and considered in my medical decision making (see chart for details).    MDM Rules/Calculators/A&P  Patient given a prescription ointment and tramadol to go home with.  Advised him to continue contacted to see if there is any cancellations for sooner appointment.  Advised return if he has fevers worsening pain or any additional concerns.   Final Clinical Impression(s) / ED Diagnoses Final diagnoses:  Pain due to dental caries    Rx / DC Orders ED Discharge Orders         Ordered    traMADol (ULTRAM) 50 MG tablet  Every 6 hours PRN        02/02/20 1304    amoxicillin (AMOXIL) 500 MG capsule  3 times daily        02/02/20 1304           Luna Fuse, MD 02/02/20 1304

## 2020-02-03 ENCOUNTER — Telehealth: Payer: Self-pay | Admitting: *Deleted

## 2020-02-03 NOTE — Telephone Encounter (Signed)
I called pt no vm to get his [employer] ID card to get PA for MRI. Will try again later.

## 2020-02-04 ENCOUNTER — Other Ambulatory Visit: Payer: Self-pay

## 2020-02-05 MED FILL — ROSUVASTATIN CALCIUM 5 MG T: 5 | 30 days supply | Qty: 30 | Fill #1

## 2020-02-09 NOTE — Telephone Encounter (Signed)
Tried calling pt again to get information for his [employer] insurance, vm not set up.

## 2020-02-17 ENCOUNTER — Other Ambulatory Visit: Payer: Self-pay

## 2020-02-17 ENCOUNTER — Ambulatory Visit
Admission: RE | Admit: 2020-02-17 | Discharge: 2020-02-17 | Disposition: A | Discharge status: Home / Self Care | Payer: 59 | Source: Ambulatory Visit | Attending: Family Medicine | Admitting: Family Medicine

## 2020-02-17 ENCOUNTER — Telehealth: Payer: Self-pay | Admitting: Family Medicine

## 2020-02-17 DIAGNOSIS — M542 Cervicalgia: Secondary | ICD-10-CM

## 2020-02-17 DIAGNOSIS — M545 Low back pain, unspecified: Secondary | ICD-10-CM

## 2020-02-17 DIAGNOSIS — G8929 Other chronic pain: Secondary | ICD-10-CM

## 2020-02-17 NOTE — Telephone Encounter (Signed)
MRI of the neck reveals:  A disc protrusion and bone spurs at C4-5 resulting in mild narrowing of the spinal canal and the left nerve opening.  Another disc protrusion and bone spurs at C5-6 which contacts the spinal cord and causes mild narrowing of the spinal canal.  Another disc protrusion with bone spurs and moderate right-sided narrowing of the nerve opening.   Lumbar MRI scan is most notable for a disc herniation at L4-5 which is next to the L5 nerve roots.  Depending on preference, could contemplate another trial of chiropractic, referral for steroid injections.  If neither of these helps, could contemplate surgical consult.  Let me know your preference.

## 2020-02-21 ENCOUNTER — Other Ambulatory Visit: Payer: 59

## 2020-02-21 ENCOUNTER — Other Ambulatory Visit: Payer: Self-pay

## 2020-02-21 DIAGNOSIS — Z20822 Contact with and (suspected) exposure to covid-19: Secondary | ICD-10-CM

## 2020-02-24 ENCOUNTER — Ambulatory Visit: Payer: Self-pay | Admitting: Emergency Medicine

## 2020-02-24 ENCOUNTER — Telehealth: Payer: Self-pay | Admitting: Emergency Medicine

## 2020-02-24 LAB — NOVEL CORONAVIRUS, NAA: SARS-CoV-2, NAA: NOT DETECTED

## 2020-02-24 NOTE — Telephone Encounter (Signed)
Called and spoke with pt who stated he had to cancel/reschedule his PFT and OV with [facility] due to his covid test results not being back in time for his appt. Pt's appts have been rescheduled for 1/31 with pt scheduled to have the covid test 1/27.  Pt stated that he has to take the bus to get to places but stated he would see if he could find a ride to take him to have the covid test done at testing site. Pt then also stated he would call PCP to see if he could get the covid test done there. Stated to pt to make sure that the covid test is the PCR test and he verbalized understanding. Pt stated he would call us if he ran into any issues. Nothing further needed.

## 2020-02-24 NOTE — Telephone Encounter (Signed)
Please call pt on this number provided CB# [phone]

## 2020-02-25 ENCOUNTER — Ambulatory Visit (INDEPENDENT_AMBULATORY_CARE_PROVIDER_SITE_OTHER): Payer: 59 | Admitting: Family Medicine

## 2020-02-25 ENCOUNTER — Other Ambulatory Visit: Payer: Self-pay

## 2020-02-25 ENCOUNTER — Ambulatory Visit: Payer: 59 | Admitting: Family Medicine

## 2020-02-25 ENCOUNTER — Encounter: Payer: Self-pay | Admitting: Family Medicine

## 2020-02-25 DIAGNOSIS — G8929 Other chronic pain: Secondary | ICD-10-CM | POA: Diagnosis not present

## 2020-02-25 DIAGNOSIS — M542 Cervicalgia: Secondary | ICD-10-CM | POA: Diagnosis not present

## 2020-02-25 DIAGNOSIS — M545 Low back pain, unspecified: Secondary | ICD-10-CM | POA: Diagnosis not present

## 2020-02-25 MED ORDER — PREGABALIN 50 MG PO CAPS: ORAL_CAPSULE | ORAL | 5 refills | Status: DC

## 2020-02-25 NOTE — Progress Notes (Signed)
Office Visit Note   Patient: Nathan [name]           Date of Birth: 1971/01/23           MRN: [id number] Visit Date: 02/25/2020 Requested by: Vassie Moment, MD [address] [place],  [place] [address] PCP: Vassie Moment, MD  Subjective: Chief Complaint  Patient presents with  . Lower Back - Pain, Follow-up    MRI review  . Neck - Pain, Follow-up    MRI review. Noticed that after he drank some water the other day, he had pain in the posterior neck. Food/liquids get stuck in his throat sometimes when swallowing.    HPI: He is here to discuss MRI results of cervical and lumbar spine.  No change in his symptoms.  Medications have not helped, physical therapy did not help.  He had 1 lumbar epidural injection at [facility] which did not help.                ROS:   All other systems were reviewed and are negative.  Objective: Vital Signs: There were no vitals taken for this visit.  Physical Exam:  General:  Alert and oriented, in no acute distress. Pulm:  Breathing unlabored. Psy:  Normal mood, congruent affect.    Imaging: MRI images were reviewed in detail with the patient.  He has C4-5 and C5-6 disc osteophyte complexes causing spinal cord contact at C5-6.  In the lumbar spine there is a disc herniation at L4-5 which contacts the L5 nerve roots.    Assessment & Plan: 1. chronic neck and low back pain with disc protrusions as noted -We will try Lyrica.  Referral to Dr. Sherley Bounds for surgical consult.  Follow-up as needed.     Procedures: No procedures performed        PMFS History: Patient Active Problem List   Diagnosis Date Noted  . COVID-19 virus infection 10/21/2019  . Shoulder pain, left 10/23/2016  . Other chest pain 08/22/2014  . Upper airway cough syndrome 07/23/2014  . Sarcoidosis 03/06/2011   Past Medical History:  Diagnosis Date  . Arthritis   . Asthma    'I think I might have asthma"  . Chronic back pain   . Dyspnea    chronic   . Hyperlipidemia   . Sarcoidosis     Family History  Problem Relation Age of Onset  . Heart disease Maternal Grandfather   . Heart disease Paternal Uncle   . Prostate cancer Paternal Uncle   . Hypertension Mother   . Diabetes Mother   . Heart attack Father   . Diabetes Father   . Hypertension Father   . Colon polyps Sister   . Colon cancer Neg Hx   . Esophageal cancer Neg Hx   . Rectal cancer Neg Hx   . Stomach cancer Neg Hx     No past surgical history on file. Social History   Occupational History  . Occupation: [employer]  Tobacco Use  . Smoking status: Former Smoker    Packs/day: 1.00    Years: 20.00    Pack years: 20.00    Types: Cigarettes    Quit date: 01/13/2011    Years since quitting: 9.1  . Smokeless tobacco: Never Used  Vaping Use  . Vaping Use: Never used  Substance and Sexual Activity  . Alcohol use: No    Alcohol/week: 0.0 standard drinks  . Drug use: No  . Sexual activity: Yes  Birth control/protection: Condom

## 2020-03-05 ENCOUNTER — Encounter: Payer: Self-pay | Admitting: Nurse Practitioner

## 2020-03-05 ENCOUNTER — Ambulatory Visit: Payer: 59 | Attending: Nurse Practitioner | Admitting: Nurse Practitioner

## 2020-03-05 ENCOUNTER — Other Ambulatory Visit: Payer: Self-pay

## 2020-03-05 DIAGNOSIS — Z7689 Persons encountering health services in other specified circumstances: Secondary | ICD-10-CM

## 2020-03-05 DIAGNOSIS — R7309 Other abnormal glucose: Secondary | ICD-10-CM

## 2020-03-05 DIAGNOSIS — R1312 Dysphagia, oropharyngeal phase: Secondary | ICD-10-CM

## 2020-03-05 DIAGNOSIS — E785 Hyperlipidemia, unspecified: Secondary | ICD-10-CM

## 2020-03-05 NOTE — Progress Notes (Signed)
Virtual Visit via Telephone Note Due to national recommendations of social distancing due to [place] 19, telehealth visit is felt to be most appropriate for this patient at this time.  I discussed the limitations, risks, security and privacy concerns of performing an evaluation and management service by telephone and the availability of in person appointments. I also discussed with the patient that there may be a patient responsible charge related to this service. The patient expressed understanding and agreed to proceed.    I connected with Nathan [name] on 03/05/20  at   2:10 PM EST  EDT by telephone and verified that I am speaking with the correct person using two identifiers.   Consent I discussed the limitations, risks, security and privacy concerns of performing an evaluation and management service by telephone and the availability of in person appointments. I also discussed with the patient that there may be a patient responsible charge related to this service. The patient expressed understanding and agreed to proceed.   Location of Patient: Private Residence   Location of Provider: [place] [facility] [employer] Office    Persons participating in Telemedicine visit: Geryl Rankins FNP-BC [place]    History of Present Illness: Telemedicine visit for: Establish Care  has a past medical history of Arthritis, Asthma, Chronic back pain, Dyspnea, Hyperlipidemia, and Sarcoidosis.   He sees Pulmonology for Sarcoidosis He is seeing Ortho for chronic neck and back pain Colonoscopy 09-2019. Several polyps with return for f/u colo in 3 years.  He has never been diagnosed with HTN or Hyperlipidemia States he was diagnosed with "borderline Diabetes" in the past however I do not have record of this  He has concerns of oropharyngeal dysphagia with solids and liquids. This has been ocurring since last year. Feels as if food is getting stuck in his throat.  Sometimes gets strangled drinking water. States his mother has required esophageal dilatation in the past  Denies chest pain, shortness of breath, palpitations, lightheadedness, dizziness, headaches or BLE edema.        Past Medical History:  Diagnosis Date  . Arthritis   . Asthma    'I think I might have asthma"  . Chronic back pain   . Dyspnea    chronic  . Hyperlipidemia   . Sarcoidosis     Past Surgical History:  Procedure Laterality Date  . NO PAST SURGERIES      Family History  Problem Relation Age of Onset  . Heart disease Maternal Grandfather   . Heart disease Paternal Uncle   . Prostate cancer Paternal Uncle   . Hypertension Mother   . Diabetes Mother   . Heart attack Father   . Diabetes Father   . Hypertension Father   . Colon polyps Sister   . Colon cancer Neg Hx   . Esophageal cancer Neg Hx   . Rectal cancer Neg Hx   . Stomach cancer Neg Hx     Social History   Socioeconomic History  . Marital status: Married    Spouse name: Not on file  . Number of children: 2  . Years of education: Not on file  . Highest education level: Not on file  Occupational History  . Occupation: [employer]  Tobacco Use  . Smoking status: Former Smoker    Packs/day: 1.00    Years: 20.00    Pack years: 20.00    Types: Cigarettes    Quit date: 01/13/2011    Years since quitting:  9.1  . Smokeless tobacco: Never Used  Vaping Use  . Vaping Use: Never used  Substance and Sexual Activity  . Alcohol use: No    Alcohol/week: 0.0 standard drinks  . Drug use: No  . Sexual activity: Yes    Birth control/protection: Condom  Other Topics Concern  . Not on file  Social History Narrative  . Not on file   Social Determinants of Health   Financial Resource Strain: Not on file  Food Insecurity: Not on file  Transportation Needs: Not on file  Physical Activity: Not on file  Stress: Not on file  Social Connections: Not on file     Observations/Objective: Awake, alert and  oriented x 3   Review of Systems  Constitutional: Negative for fever, malaise/fatigue and weight loss.  HENT: Negative.  Negative for nosebleeds.        SEE HPI  Eyes: Negative.  Negative for blurred vision, double vision and photophobia.  Respiratory: Negative.  Negative for cough and shortness of breath.   Cardiovascular: Negative.  Negative for chest pain, palpitations and leg swelling.  Gastrointestinal: Negative.  Negative for heartburn, nausea and vomiting.  Musculoskeletal: Negative.  Negative for myalgias.  Neurological: Negative.  Negative for dizziness, focal weakness, seizures and headaches.  Psychiatric/Behavioral: Negative.  Negative for suicidal ideas.    Assessment and Plan: Nathan [name] was seen today for establish care.  Diagnoses and all orders for this visit:  Encounter to establish care  Elevated glucose -     Hemoglobin A1c; Future  Dyslipidemia, goal LDL below 100 -     Lipid panel; Future  Oropharyngeal dysphagia     Follow Up Instructions Return in about 2 months (around 05/03/2020).     I discussed the assessment and treatment plan with the patient. The patient was provided an opportunity to ask questions and all were answered. The patient agreed with the plan and demonstrated an understanding of the instructions.   The patient was advised to call back or seek an in-person evaluation if the symptoms worsen or if the condition fails to improve as anticipated.  I provided 18 minutes of non-face-to-face time during this encounter including median intraservice time, reviewing previous notes, labs, imaging, medications and explaining diagnosis and management.  Gildardo Pounds, FNP-BC

## 2020-03-08 ENCOUNTER — Emergency Department (HOSPITAL_COMMUNITY)
Admission: EM | Admit: 2020-03-08 | Discharge: 2020-03-08 | Disposition: A | Discharge status: Home / Self Care | Payer: 59 | Attending: Emergency Medicine | Admitting: Emergency Medicine

## 2020-03-08 ENCOUNTER — Other Ambulatory Visit: Payer: Self-pay

## 2020-03-08 ENCOUNTER — Encounter (HOSPITAL_COMMUNITY): Payer: Self-pay | Admitting: *Deleted

## 2020-03-08 DIAGNOSIS — M5442 Lumbago with sciatica, left side: Secondary | ICD-10-CM | POA: Diagnosis not present

## 2020-03-08 DIAGNOSIS — Z8616 Personal history of COVID-19: Secondary | ICD-10-CM | POA: Insufficient documentation

## 2020-03-08 DIAGNOSIS — J45909 Unspecified asthma, uncomplicated: Secondary | ICD-10-CM | POA: Insufficient documentation

## 2020-03-08 DIAGNOSIS — G8929 Other chronic pain: Secondary | ICD-10-CM | POA: Diagnosis not present

## 2020-03-08 DIAGNOSIS — Z87891 Personal history of nicotine dependence: Secondary | ICD-10-CM | POA: Insufficient documentation

## 2020-03-08 DIAGNOSIS — M545 Low back pain, unspecified: Secondary | ICD-10-CM | POA: Diagnosis present

## 2020-03-08 LAB — URINALYSIS, ROUTINE W REFLEX MICROSCOPIC
Bilirubin Urine: NEGATIVE
Glucose, UA: NEGATIVE mg/dL
Hgb urine dipstick: NEGATIVE
Ketones, ur: NEGATIVE mg/dL
Leukocytes,Ua: NEGATIVE
Nitrite: NEGATIVE
Protein, ur: NEGATIVE mg/dL
Specific Gravity, Urine: 1.013 (ref 1.005–1.030)
pH: 6 (ref 5.0–8.0)

## 2020-03-08 MED ORDER — ACETAMINOPHEN 500 MG PO TABS
500.0000 mg | ORAL_TABLET | Freq: Once | ORAL | Status: AC
  Administered 2020-03-08: 500 mg via ORAL
  Filled 2020-03-08: qty 1

## 2020-03-08 MED ORDER — TRAMADOL HCL 50 MG PO TABS
50.0000 mg | ORAL_TABLET | Freq: Once | ORAL | Status: AC
  Administered 2020-03-08: 50 mg via ORAL
  Filled 2020-03-08: qty 1

## 2020-03-08 MED ORDER — CYCLOBENZAPRINE HCL 10 MG PO TABS: 10.0000 mg | ORAL_TABLET | Freq: Two times a day (BID) | ORAL | 0 refills | Status: DC | PRN

## 2020-03-08 MED ORDER — PREDNISONE 10 MG (21) PO TBPK: ORAL_TABLET | Freq: Every day | ORAL | 0 refills | Status: DC

## 2020-03-08 NOTE — ED Provider Notes (Signed)
[facility] EMERGENCY DEPARTMENT Provider Note   CSN: [id number] Arrival date & time: 03/08/20  1042     History Chief Complaint  Patient presents with  . Back Pain    Nathan [name] is a 50 y.o. male. Presents to ER with concern for back pain. Patient states that he suffers from chronic back pain, followed by sports medicine doctor who recently referred to a spine specialist due to abnormal MRI. He reports that over the last couple days he has had worsening back pain, worsened left lower back, normally is more central. States pain is worse with certain positions, improved with rest. Currently 8 out of 10 in severity. No nausea or vomiting, no fevers or abdominal pain. No dysuria, hematuria. Takes Motrin and the prescribed pregabalin. He has not been on any steroids recently.` No numbness, weakness, bladder or bowel incontinence. No trauma. HPI     Past Medical History:  Diagnosis Date  . Arthritis   . Asthma    'I think I might have asthma"  . Chronic back pain   . Dyspnea    chronic  . Hyperlipidemia   . Sarcoidosis     Patient Active Problem List   Diagnosis Date Noted  . COVID-19 virus infection 10/21/2019  . Shoulder pain, left 10/23/2016  . Other chest pain 08/22/2014  . Upper airway cough syndrome 07/23/2014  . Sarcoidosis 03/06/2011    Past Surgical History:  Procedure Laterality Date  . NO PAST SURGERIES         Family History  Problem Relation Age of Onset  . Heart disease Maternal Grandfather   . Heart disease Paternal Uncle   . Prostate cancer Paternal Uncle   . Hypertension Mother   . Diabetes Mother   . Heart attack Father   . Diabetes Father   . Hypertension Father   . Colon polyps Sister   . Colon cancer Neg Hx   . Esophageal cancer Neg Hx   . Rectal cancer Neg Hx   . Stomach cancer Neg Hx     Social History   Tobacco Use  . Smoking status: Former Smoker    Packs/day: 1.00    Years: 20.00    Pack years: 20.00     Types: Cigarettes    Quit date: 01/13/2011    Years since quitting: 9.1  . Smokeless tobacco: Never Used  Vaping Use  . Vaping Use: Never used  Substance Use Topics  . Alcohol use: No    Alcohol/week: 0.0 standard drinks  . Drug use: No    Home Medications Prior to Admission medications   Medication Sig Start Date End Date Taking? Authorizing Provider  cyclobenzaprine (FLEXERIL) 10 MG tablet Take 1 tablet (10 mg total) by mouth 2 (two) times daily as needed for muscle spasms. 03/08/20  Yes Issiac Jamar, Ellwood Dense, MD  predniSONE (STERAPRED UNI-PAK 21 TAB) 10 MG (21) TBPK tablet Take by mouth daily. Take 6 tabs by mouth daily  for 1 days, then 5 tabs for 1 days, then 4 tabs for 1 days, then 3 tabs for 1 days, 2 tabs for 1 days, then 1 tab by mouth daily for 1 days 03/08/20  Yes Marlen Mollica, Ellwood Dense, MD  albuterol (VENTOLIN HFA) 108 (90 Base) MCG/ACT inhaler Inhale 2 puffs into the lungs every 6 (six) hours as needed for wheezing or shortness of breath. 12/26/19   Fulp, Cammie, MD  cetirizine (ZYRTEC) 10 MG tablet Take 10 mg by mouth daily. 01/15/20  [provider]  Cholecalciferol (VITAMIN D-3) 125 MCG (5000 UT) TABS Take 1 tablet by mouth daily. 01/21/20   Hilts, Legrand Como, MD  diclofenac (VOLTAREN) 75 MG EC tablet Take 1 tablet (75 mg total) by mouth 2 (two) times daily. As needed for pain; take after eating 12/26/19   Fulp, Cammie, MD  fluticasone (FLONASE) 50 MCG/ACT nasal spray Place 1 spray into both nostrils 2 (two) times daily. 12/26/19   Fulp, Cammie, MD  ibuprofen (ADVIL) 600 MG tablet Take 600 mg by mouth every 6 (six) hours as needed. 01/07/20   [provider]  ibuprofen (ADVIL) 800 MG tablet Take 800 mg by mouth every 8 (eight) hours as needed. 01/07/20   [provider]  ipratropium (ATROVENT) 0.06 % nasal spray Place 2 sprays into both nostrils 4 (four) times daily as needed for rhinitis. 12/26/19   Fulp, Cammie, MD  pregabalin (LYRICA) 50 MG capsule 1 PO q  HS, may increase to 1 PO BID if tolerated 02/25/20   Hilts, Michael, MD  rosuvastatin (CRESTOR) 5 MG tablet Take 1 tablet (5 mg total) by mouth daily. To lower cholesterol 12/26/19   Fulp, Cammie, MD  Tiotropium Bromide-Olodaterol (STIOLTO RESPIMAT) 2.5-2.5 MCG/ACT AERS Inhale 2 puffs into the lungs daily. Patient not taking: Reported on 03/05/2020 12/31/19   Collene Gobble, MD  traMADol (ULTRAM) 50 MG tablet Take 1 tablet (50 mg total) by mouth every 6 (six) hours as needed. 02/02/20   Luna Fuse, MD    Allergies    Percocet [oxycodone-acetaminophen] and Vicodin [hydrocodone-acetaminophen]  Review of Systems   Review of Systems  Constitutional: Negative for chills and fever.  HENT: Negative for ear pain and sore throat.   Eyes: Negative for pain and visual disturbance.  Respiratory: Negative for cough and shortness of breath.   Cardiovascular: Negative for chest pain and palpitations.  Gastrointestinal: Negative for abdominal pain and vomiting.  Genitourinary: Negative for dysuria and hematuria.  Musculoskeletal: Positive for back pain. Negative for arthralgias.  Skin: Negative for color change and rash.  Neurological: Negative for seizures and syncope.  All other systems reviewed and are negative.   Physical Exam Updated Vital Signs BP 128/84 (BP Location: Right Arm)   Pulse 71   Temp 98.7 F (37.1 C) (Oral)   Resp 16   Ht 5\' 7"  (1.702 m)   Wt 104.3 kg   SpO2 96%   BMI 36.02 kg/m   Physical Exam Vitals and nursing note reviewed.  Constitutional:      Appearance: He is well-developed and well-nourished.  HENT:     Head: Normocephalic and atraumatic.  Eyes:     Conjunctiva/sclera: Conjunctivae normal.  Cardiovascular:     Rate and Rhythm: Normal rate and regular rhythm.     Heart sounds: No murmur heard.   Pulmonary:     Effort: Pulmonary effort is normal. No respiratory distress.     Breath sounds: Normal breath sounds.  Abdominal:     Palpations: Abdomen is  soft.     Tenderness: There is no abdominal tenderness.  Musculoskeletal:        General: No edema.     Cervical back: Neck supple.     Comments: Some tenderness over lumbar region, worse on left lumbar back, no CVA tenderness  Skin:    General: Skin is warm and dry.  Neurological:     General: No focal deficit present.     Mental Status: He is alert.     Comments:  5 out of 5 strength in bilateral lower extremities  Psychiatric:        Mood and Affect: Mood and affect normal.     ED Results / Procedures / Treatments   Labs (all labs ordered are listed, but only abnormal results are displayed) Labs Reviewed  URINALYSIS, ROUTINE W REFLEX MICROSCOPIC    EKG None  Radiology No results found.  Procedures Procedures   Medications Ordered in ED Medications  traMADol (ULTRAM) tablet 50 mg (50 mg Oral Given 03/08/20 1624)  acetaminophen (TYLENOL) tablet 500 mg (500 mg Oral Given 03/08/20 1624)    ED Course  I have reviewed the triage vital signs and the nursing notes.  Pertinent labs & imaging results that were available during my care of the patient were reviewed by me and considered in my medical decision making (see chart for details).    MDM Rules/Calculators/A&P                         50 year old male presents to ER with concern for acute on chronic low back pain. Per review of chart recent MRI showing lumbar central disc herniation no neural compression. Refer to spine specialist. Reports pain is worse in severity and seems to be more left-sided than normal. Given positional nature, suspect related to MSK strain or his lumbar disease on recent MRI. UA negative for infection, no blood. Neuro intact. Recommend follow-up with outpatient spine specialist as previously scheduled for later this week. Recommended trial of steroids in the interim.    After the discussed management above, the patient was determined to be safe for discharge.  The patient was in agreement with  this plan and all questions regarding their care were answered.  ED return precautions were discussed and the patient will return to the ED with any significant worsening of condition.    Final Clinical Impression(s) / ED Diagnoses Final diagnoses:  Left-sided low back pain with left-sided sciatica, unspecified chronicity    Rx / DC Orders ED Discharge Orders         Ordered    cyclobenzaprine (FLEXERIL) 10 MG tablet  2 times daily PRN        03/08/20 1736    predniSONE (STERAPRED UNI-PAK 21 TAB) 10 MG (21) TBPK tablet  Daily        03/08/20 1736           Lucrezia Starch, MD 03/08/20 1747

## 2020-03-08 NOTE — Discharge Instructions (Addendum)
I recommend a trial of steroid taper.  Additionally recommend using muscle relaxer as needed.  Please follow-up with your Ortho specialist and spine specialist.  If you develop uncontrolled pain, numbness, weakness, fever, abdominal pain or other new concerning symptom, return to ER for reassessment.

## 2020-03-08 NOTE — ED Triage Notes (Signed)
Pt reports having left side lower back pain for several days. Hx of back pain. Denies any injury. Denies urinary symptoms.

## 2020-03-09 ENCOUNTER — Ambulatory Visit (HOSPITAL_BASED_OUTPATIENT_CLINIC_OR_DEPARTMENT_OTHER): Payer: 59 | Admitting: Nurse Practitioner

## 2020-03-09 ENCOUNTER — Ambulatory Visit: Payer: 59 | Attending: Nurse Practitioner

## 2020-03-09 ENCOUNTER — Encounter: Payer: Self-pay | Admitting: Nurse Practitioner

## 2020-03-09 ENCOUNTER — Other Ambulatory Visit: Payer: Self-pay

## 2020-03-09 DIAGNOSIS — F32A Depression, unspecified: Secondary | ICD-10-CM | POA: Diagnosis not present

## 2020-03-09 DIAGNOSIS — E785 Hyperlipidemia, unspecified: Secondary | ICD-10-CM

## 2020-03-09 DIAGNOSIS — R7309 Other abnormal glucose: Secondary | ICD-10-CM

## 2020-03-09 DIAGNOSIS — F419 Anxiety disorder, unspecified: Secondary | ICD-10-CM

## 2020-03-09 MED ORDER — HYDROXYZINE HCL 25 MG PO TABS
25.0000 mg | ORAL_TABLET | Freq: Three times a day (TID) | ORAL | 0 refills | Status: DC | PRN
Start: 2020-03-09 — End: 2020-03-09

## 2020-03-09 MED ORDER — HYDROXYZINE HCL 25 MG PO TABS: 25.0000 mg | ORAL_TABLET | Freq: Three times a day (TID) | ORAL | 0 refills | Status: AC | PRN

## 2020-03-09 NOTE — Progress Notes (Signed)
Virtual Visit via Telephone Note Due to national recommendations of social distancing due to [place] 19, telehealth visit is felt to be most appropriate for this patient at this time.  I discussed the limitations, risks, security and privacy concerns of performing an evaluation and management service by telephone and the availability of in person appointments. I also discussed with the patient that there may be a patient responsible charge related to this service. The patient expressed understanding and agreed to proceed.    I connected with [name] on 03/09/20  at   1:30 PM EST  EDT by telephone and verified that I am speaking with the correct person using two identifiers.   Consent I discussed the limitations, risks, security and privacy concerns of performing an evaluation and management service by telephone and the availability of in person appointments. I also discussed with the patient that there may be a patient responsible charge related to this service. The patient expressed understanding and agreed to proceed.   Location of Patient: Private Residence   Location of Provider: [place] [facility] [employer] Office    Persons participating in Telemedicine visit: Geryl Rankins FNP-BC [place]    History of Present Illness: Telemedicine visit for: Anxiety He has a past medical history of Arthritis, Asthma, Chronic back pain, Dyspnea, Hyperlipidemia, and Sarcoidosis.     Today he has concerns of anxiety which has been controlled. Symptoms include: easily distracted, nausea, difficulty breathing, panic sensation, forgetfulness. Aggravating factors: NONE. He and his spouse have been staying in a hotel for 2 years. Has lots of financial stressors.  Depression screen [facility] 2/9 03/09/2020 12/26/2019  Decreased Interest 1 0  Down, Depressed, Hopeless 1 0  PHQ - 2 Score 2 0  Altered sleeping 1 -  Tired, decreased energy 0 -  Change in appetite 1 -   Feeling bad or failure about yourself  1 -  Trouble concentrating 1 -  Moving slowly or fidgety/restless 0 -  Suicidal thoughts 0 -  PHQ-9 Score 6 -   GAD 7 : Generalized Anxiety Score 03/09/2020  Nervous, Anxious, on Edge 1  Control/stop worrying 1  Worry too much - different things 1  Trouble relaxing 1  Restless 1  Easily annoyed or irritable 2  Afraid - awful might happen 0  Total GAD 7 Score 7        Past Medical History:  Diagnosis Date  . Arthritis   . Asthma    'I think I might have asthma"  . Chronic back pain   . Dyspnea    chronic  . Hyperlipidemia   . Sarcoidosis     Past Surgical History:  Procedure Laterality Date  . NO PAST SURGERIES      Family History  Problem Relation Age of Onset  . Heart disease Maternal Grandfather   . Heart disease Paternal Uncle   . Prostate cancer Paternal Uncle   . Hypertension Mother   . Diabetes Mother   . Heart attack Father   . Diabetes Father   . Hypertension Father   . Colon polyps Sister   . Colon cancer Neg Hx   . Esophageal cancer Neg Hx   . Rectal cancer Neg Hx   . Stomach cancer Neg Hx     Social History   Socioeconomic History  . Marital status: Married    Spouse name: Not on file  . Number of children: 2  . Years of education: Not on file  .  Highest education level: Not on file  Occupational History  . Occupation: [employer]  Tobacco Use  . Smoking status: Former Smoker    Packs/day: 1.00    Years: 20.00    Pack years: 20.00    Types: Cigarettes    Quit date: 01/13/2011    Years since quitting: 9.1  . Smokeless tobacco: Never Used  Vaping Use  . Vaping Use: Never used  Substance and Sexual Activity  . Alcohol use: No    Alcohol/week: 0.0 standard drinks  . Drug use: No  . Sexual activity: Yes    Birth control/protection: Condom  Other Topics Concern  . Not on file  Social History Narrative  . Not on file   Social Determinants of Health   Financial Resource Strain: Not on file   Food Insecurity: Not on file  Transportation Needs: Not on file  Physical Activity: Not on file  Stress: Not on file  Social Connections: Not on file     Observations/Objective: Awake, alert and oriented x 3   Review of Systems  Constitutional: Negative for fever, malaise/fatigue and weight loss.  HENT: Negative.  Negative for nosebleeds.   Eyes: Negative.  Negative for blurred vision, double vision and photophobia.  Respiratory: Negative.  Negative for cough and shortness of breath.   Cardiovascular: Negative.  Negative for chest pain, palpitations and leg swelling.  Gastrointestinal: Negative.  Negative for heartburn, nausea and vomiting.  Musculoskeletal: Negative.  Negative for myalgias.  Neurological: Negative.  Negative for dizziness, focal weakness, seizures and headaches.  Psychiatric/Behavioral: Positive for depression. Negative for suicidal ideas. The patient is nervous/anxious.     Assessment and Plan: Helaman was seen today for anxiety.  Diagnoses and all orders for this visit:  Anxiety and depression -     hydrOXYzine (ATARAX/VISTARIL) 25 MG tablet; Take 1 tablet (25 mg total) by mouth every 8 (eight) hours as needed for anxiety.     Follow Up Instructions Return in about 2 months (around 05/07/2020).     I discussed the assessment and treatment plan with the patient. The patient was provided an opportunity to ask questions and all were answered. The patient agreed with the plan and demonstrated an understanding of the instructions.   The patient was advised to call back or seek an in-person evaluation if the symptoms worsen or if the condition fails to improve as anticipated.  I provided 14 minutes of non-face-to-face time during this encounter including median intraservice time, reviewing previous notes, labs, imaging, medications and explaining diagnosis and management.  Gildardo Pounds, FNP-BC

## 2020-03-10 LAB — HEMOGLOBIN A1C
Est. average glucose Bld gHb Est-mCnc: 126 mg/dL
Hgb A1c MFr Bld: 6 % — ABNORMAL HIGH (ref 4.8–5.6)

## 2020-03-10 LAB — LIPID PANEL
Chol/HDL Ratio: 4.2 ratio (ref 0.0–5.0)
Cholesterol, Total: 172 mg/dL (ref 100–199)
HDL: 41 mg/dL (ref >39)
LDL Chol Calc (NIH): 116 mg/dL — ABNORMAL HIGH (ref 0–99)
Triglycerides: 80 mg/dL (ref 0–149)
VLDL Cholesterol Cal: 15 mg/dL (ref 5–40)

## 2020-03-11 ENCOUNTER — Other Ambulatory Visit (HOSPITAL_COMMUNITY)
Admission: RE | Admit: 2020-03-11 | Discharge: 2020-03-11 | Disposition: A | Discharge status: Home / Self Care | Payer: 59 | Source: Ambulatory Visit | Attending: Emergency Medicine | Admitting: Emergency Medicine

## 2020-03-11 DIAGNOSIS — Z01812 Encounter for preprocedural laboratory examination: Secondary | ICD-10-CM | POA: Diagnosis present

## 2020-03-11 DIAGNOSIS — Z20822 Contact with and (suspected) exposure to covid-19: Secondary | ICD-10-CM | POA: Insufficient documentation

## 2020-03-12 LAB — SARS CORONAVIRUS 2 (TAT 6-24 HRS): SARS Coronavirus 2: NEGATIVE

## 2020-03-15 ENCOUNTER — Other Ambulatory Visit: Payer: Self-pay

## 2020-03-15 ENCOUNTER — Encounter: Payer: Self-pay | Admitting: Emergency Medicine

## 2020-03-15 ENCOUNTER — Ambulatory Visit (INDEPENDENT_AMBULATORY_CARE_PROVIDER_SITE_OTHER): Payer: 59 | Admitting: Emergency Medicine

## 2020-03-15 DIAGNOSIS — D869 Sarcoidosis, unspecified: Secondary | ICD-10-CM

## 2020-03-15 LAB — PULMONARY FUNCTION TEST
DL/VA % pred: 143 %
DL/VA: 6.49 ml/min/mmHg/L
DLCO cor % pred: 71 %
DLCO cor: 20.79 ml/min/mmHg
DLCO unc % pred: 69 %
DLCO unc: 20.05 ml/min/mmHg
FEF 25-75 Post: 2.03 L/sec
FEF 25-75 Pre: 1.73 L/sec
FEF2575-%Change-Post: 16 %
FEF2575-%Pred-Post: 60 %
FEF2575-%Pred-Pre: 51 %
FEV1-%Change-Post: 2 %
FEV1-%Pred-Post: 56 %
FEV1-%Pred-Pre: 54 %
FEV1-Post: 1.86 L
FEV1-Pre: 1.81 L
FEV1FVC-%Change-Post: 3 %
FEV1FVC-%Pred-Pre: 99 %
FEV6-%Change-Post: 0 %
FEV6-%Pred-Post: 56 %
FEV6-%Pred-Pre: 56 %
FEV6-Post: 2.24 L
FEV6-Pre: 2.26 L
FEV6FVC-%Pred-Post: 102 %
FEV6FVC-%Pred-Pre: 102 %
FVC-%Change-Post: 0 %
FVC-%Pred-Post: 54 %
FVC-%Pred-Pre: 55 %
FVC-Post: 2.24 L
FVC-Pre: 2.26 L
Post FEV1/FVC ratio: 83 %
Post FEV6/FVC ratio: 100 %
Pre FEV1/FVC ratio: 80 %
Pre FEV6/FVC Ratio: 100 %
RV % pred: 105 %
RV: 2.03 L
TLC % pred: 61 %
TLC: 4.12 L

## 2020-03-15 MED ORDER — ALBUTEROL SULFATE HFA 108 (90 BASE) MCG/ACT IN AERS: 2.0000 | INHALATION_SPRAY | Freq: Four times a day (QID) | RESPIRATORY_TRACT | 5 refills | Status: DC | PRN

## 2020-03-15 NOTE — Assessment & Plan Note (Signed)
Your skin biopsy and your CT scan of the chest are consistent with sarcoidosis.  Your breathing tests suggest that you have associated asthma. Please continue Symbicort 2 puffs twice a day.  Rinse and gargle after use this medication.  This is her maintenance medicine to be used on a schedule. We will write a prescription for an albuterol inhaler.  This will be your symptom driven, rescue medication.  You can use 2 puffs up to every 4 hours if needed for shortness of breath, chest tightness, wheezing. We will plan to repeat your CT scan of the chest without contrast to follow sarcoidosis in August 2022. Follow with Dr Kyrah Schiro in 4 months or sooner if you have any problems.  

## 2020-03-15 NOTE — Progress Notes (Signed)
Subjective:    Patient ID: Nathan [name], male    DOB: 02-13-1971, 50 y.o.   MRN: [id number]  HPI  ROV 10/21/19 --follow-up visit for 50 year old former smoker with clinically diagnosed sarcoid (some question as to whether is skin biopsy 2019 was actually diagnostic, showed granulomatous dermatitis).  He has been treated in the past with prednisone.  I met him in July at which time his ACE level was normal.  We ordered a CT chest and PFT.  Unfortunately since that visit he contracted COVID-19 in 8/25. Was prescribed prednisone, never picked it up to use it. His PFT had to be cancelled. He is still experiencing cough, a lot of R frontal HA and drainage, greenish mucous. No fevers. He has used some albuterol - unsure if it is doing much for him.   CT chest 09/15/2019 reviewed by me, shows symmetric mild bilateral hilar lymphadenopathy, mediastinal adenopathy with a 2.7 cm subcarinal node, unchanged compared with 02/12/2017.  Upper lobe dominant ill-defined nodular scarring, again unchanged.  Left upper lobe nodule 1.3 x 0.8 cm, 9 mm subpleural posterior left lower lobe nodule.  Stable upper lobe predominant paraseptal emphysema.  Chronically elevated right hemidiaphragm.   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  He had COVID-19 pneumonia in August.  CT chest prior to that 09/15/2019 showed stable lymphadenopathy as above, left upper lobe and left lower lobe pulmonary nodules and emphysematous change with a chronically elevated right hemidiaphragm.  He has not yet had pulmonary function testing.  At his last visit he was having some improvement in exertional dyspnea with albuterol so we started Breo, continued albuterol and fluticasone nasal spray.  Today he reports that he was never able to get Breo due to cost, was referred over to [facility] but  it was unclear which inhaler he needed so he only has albuterol. He is still having dyspnea with working, walking, cleaning.   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  Is been treated in the past for this with prednisone.  Has mediastinal lymphadenopathy, left upper and lower lobe pulmonary nodule disease and a chronically elevated right hemidiaphragm.  He also had COVID-19 pneumonia in August 2021.  I tried starting him on Stiolto at his last visit to see if he would get benefit. He was unable to obtain it but was able to get assistance obtaining Symbicort.  He reports that he is getting some benefit from it - does seem to help his SOB. He still has SOB with heavy exertion like running, working.  He is seeing orthopedics for significant disk disease.   Pulmonary function testing done today reviewed by me, shows mixed obstruction and restriction on spirometry with a severe decrease in his FEV1 at 1.81 L (54% predicted).  No bronchodilator response.  Severely restricted total lung capacity with a normal RV.  His diffusion capacity is decreased and corrects to the normal range when adjusted for alveolar volume.   Review of Systems As per HPI     Objective:   Physical Exam Vitals:   03/15/20 1626  BP: 138/74  Pulse: 85  Temp: (!) 97.3 F (36.3 C)  SpO2: 97%  Weight: 240 lb (108.9 kg)  Height: _0  (1.753 m)   Gen: Pleasant, well-nourished, in no distress,  normal affect  ENT: No lesions,  mouth clear,  oropharynx clear, no postnasal drip  Neck: No JVD, no stridor  Lungs: No use of accessory muscles, no crackles or wheezing on normal respiration, no wheeze on forced expiration  Cardiovascular: RRR, heart sounds normal, no murmur or gallops, no peripheral edema  Musculoskeletal: No deformities, no cyanosis or clubbing  Neuro: alert, awake, non focal  Skin: Warm, no rash      Assessment & Plan:   Sarcoidosis Your skin biopsy and your CT scan of the chest are consistent with sarcoidosis.  Your breathing tests suggest that you have associated asthma. Please continue Symbicort 2 puffs twice a day.  Rinse and gargle after use this medication.  This is her maintenance medicine to be used on a schedule. We will write a prescription for an albuterol inhaler.  This will be your symptom driven, rescue medication.  You can use 2 puffs up to every 4 hours if needed for shortness of breath, chest tightness, wheezing. We will plan to repeat your CT scan of the chest without contrast to follow sarcoidosis in August 2022. Follow with Dr Lamonte Sakai in 4 months or sooner if you have any problems.  Baltazar Apo, MD, PhD 03/15/2020, 4:57 PM [facility] [phone] or if no answer [phone]

## 2020-03-15 NOTE — Patient Instructions (Signed)
Your skin biopsy and your CT scan of the chest are consistent with sarcoidosis.  Your breathing tests suggest that you have associated asthma. Please continue Symbicort 2 puffs twice a day.  Rinse and gargle after use this medication.  This is her maintenance medicine to be used on a schedule. We will write a prescription for an albuterol inhaler.  This will be your symptom driven, rescue medication.  You can use 2 puffs up to every 4 hours if needed for shortness of breath, chest tightness, wheezing. We will plan to repeat your CT scan of the chest without contrast to follow sarcoidosis in August 2022. Follow with Dr Lamonte Sakai in 4 months or sooner if you have any problems.

## 2020-03-15 NOTE — Addendum Note (Signed)
Addended by: Gavin Potters R on: 03/15/2020 05:03 PM   Modules accepted: Orders

## 2020-03-15 NOTE — Progress Notes (Signed)
Full PFT performed today. °

## 2020-03-18 ENCOUNTER — Encounter: Payer: Self-pay | Admitting: Physician Assistant

## 2020-03-18 ENCOUNTER — Ambulatory Visit: Payer: 59 | Attending: Physician Assistant | Admitting: Physician Assistant

## 2020-03-18 ENCOUNTER — Other Ambulatory Visit: Payer: Self-pay

## 2020-03-18 DIAGNOSIS — M5126 Other intervertebral disc displacement, lumbar region: Secondary | ICD-10-CM

## 2020-03-18 NOTE — Progress Notes (Signed)
Patient ID: Nathan [name], male   DOB: 06/03/1970, 50 y.o.   MRN: [id number] Virtual Visit via Telephone Note  I connected with Nathan [name] on 03/18/20 at 10:10 AM EST by telephone and verified that I am speaking with the correct person using two identifiers.  Location: Patient: home Provider: [facility] office   I discussed the limitations, risks, security and privacy concerns of performing an evaluation and management service by telephone and the availability of in person appointments. I also discussed with the patient that there may be a patient responsible charge related to this service. The patient expressed understanding and agreed to proceed.   History of Present Illness: Ortho sent him for MRI and he went and saw a neurosurgeon.  MRI L-S Imaging showed: 1. L5 is described as a transitional vertebra. 2. L4-5: Central disc herniation indents the thecal sac and is adjacent to the exiting L5 root sleeves. This could certainly be a cause of back pain or neural irritation, but neural compression is not demonstrated. 3. Mild, non-compressive degenerative changes at L1-2, L2-3 and L3-4.   They have recommended he have surgery but he is scared to do that and would like to see pain management before opting for surgery.  No new issues or concerns  Observations/Objective: NAD.  A&Ox3   Assessment and Plan: 1. Lumbar herniated disc - Ambulatory referral to Pain Clinic   Follow Up Instructions: See PCP as planned next month   I discussed the assessment and treatment plan with the patient. The patient was provided an opportunity to ask questions and all were answered. The patient agreed with the plan and demonstrated an understanding of the instructions.   The patient was advised to call back or seek an in-person evaluation if the symptoms worsen or if the condition fails to improve as anticipated.  I provided 9 minutes of non-face-to-face time during this  encounter.   Freeman Caldron, PA-C

## 2020-03-23 ENCOUNTER — Telehealth: Payer: Self-pay | Admitting: Nurse Practitioner

## 2020-03-23 NOTE — Telephone Encounter (Signed)
Copied from [place] [id number]. Topic: General - Inquiry >> Mar 23, 2020 12:46 PM Gillis Ends D wrote: Reason for CRM: Patient would like to get his lab results. He can be reached at [phone]. He will be at work he ask that you leave him a message. Please advise

## 2020-03-24 NOTE — Telephone Encounter (Signed)
Spoke to patient and informed on results. Patient understood and verified DOB.

## 2020-03-30 ENCOUNTER — Encounter: Payer: Self-pay | Admitting: Physical Medicine & Rehabilitation

## 2020-04-02 ENCOUNTER — Telehealth: Payer: Self-pay | Admitting: Nurse Practitioner

## 2020-04-02 NOTE — Telephone Encounter (Signed)
Call placed to patient, wife answered and said patient was not available. Left a message with wife that I was calling from [facility] and that patients upcoming appointment on 04/16/20 had been cancelled and needed to be reschedule. Patient wife said she would pass on the message. Advised patient wife to have patient call us back at [phone] to schedule.

## 2020-04-09 ENCOUNTER — Emergency Department (HOSPITAL_COMMUNITY)
Admission: EM | Admit: 2020-04-09 | Discharge: 2020-04-09 | Disposition: A | Discharge status: Home / Self Care | Payer: Self-pay | Attending: Emergency Medicine | Admitting: Emergency Medicine

## 2020-04-09 ENCOUNTER — Encounter (HOSPITAL_COMMUNITY): Payer: Self-pay | Admitting: Pharmacy Technician

## 2020-04-09 ENCOUNTER — Other Ambulatory Visit: Payer: Self-pay

## 2020-04-09 ENCOUNTER — Emergency Department (HOSPITAL_COMMUNITY): Payer: Self-pay

## 2020-04-09 DIAGNOSIS — R945 Abnormal results of liver function studies: Secondary | ICD-10-CM

## 2020-04-09 DIAGNOSIS — R110 Nausea: Secondary | ICD-10-CM | POA: Insufficient documentation

## 2020-04-09 DIAGNOSIS — Z79899 Other long term (current) drug therapy: Secondary | ICD-10-CM | POA: Insufficient documentation

## 2020-04-09 DIAGNOSIS — R7989 Other specified abnormal findings of blood chemistry: Secondary | ICD-10-CM

## 2020-04-09 DIAGNOSIS — S46812A Strain of other muscles, fascia and tendons at shoulder and upper arm level, left arm, initial encounter: Secondary | ICD-10-CM

## 2020-04-09 DIAGNOSIS — R1012 Left upper quadrant pain: Secondary | ICD-10-CM | POA: Insufficient documentation

## 2020-04-09 DIAGNOSIS — J45909 Unspecified asthma, uncomplicated: Secondary | ICD-10-CM | POA: Insufficient documentation

## 2020-04-09 DIAGNOSIS — Z87891 Personal history of nicotine dependence: Secondary | ICD-10-CM | POA: Insufficient documentation

## 2020-04-09 DIAGNOSIS — Z8616 Personal history of COVID-19: Secondary | ICD-10-CM | POA: Insufficient documentation

## 2020-04-09 DIAGNOSIS — R1010 Upper abdominal pain, unspecified: Secondary | ICD-10-CM

## 2020-04-09 DIAGNOSIS — M25512 Pain in left shoulder: Secondary | ICD-10-CM | POA: Insufficient documentation

## 2020-04-09 LAB — CBC WITH DIFFERENTIAL/PLATELET
Abs Immature Granulocytes: 0.01 10*3/uL (ref 0.00–0.07)
Basophils Absolute: 0 10*3/uL (ref 0.0–0.1)
Basophils Relative: 1 %
Eosinophils Absolute: 0.2 10*3/uL (ref 0.0–0.5)
Eosinophils Relative: 5 %
HCT: 38.8 % — ABNORMAL LOW (ref 39.0–52.0)
Hemoglobin: 12.3 g/dL — ABNORMAL LOW (ref 13.0–17.0)
Immature Granulocytes: 0 %
Lymphocytes Relative: 21 %
Lymphs Abs: 0.9 10*3/uL (ref 0.7–4.0)
MCH: 27.2 pg (ref 26.0–34.0)
MCHC: 31.7 g/dL (ref 30.0–36.0)
MCV: 85.7 fL (ref 80.0–100.0)
Monocytes Absolute: 0.6 10*3/uL (ref 0.1–1.0)
Monocytes Relative: 13 %
Neutro Abs: 2.6 10*3/uL (ref 1.7–7.7)
Neutrophils Relative %: 60 %
Platelets: 278 10*3/uL (ref 150–400)
RBC: 4.53 MIL/uL (ref 4.22–5.81)
RDW: 13.1 % (ref 11.5–15.5)
WBC: 4.4 10*3/uL (ref 4.0–10.5)
nRBC: 0 % (ref 0.0–0.2)

## 2020-04-09 LAB — TROPONIN I (HIGH SENSITIVITY): Troponin I (High Sensitivity): 8 ng/L (ref <18)

## 2020-04-09 LAB — COMPREHENSIVE METABOLIC PANEL
ALT: 47 U/L — ABNORMAL HIGH (ref 0–44)
AST: 52 U/L — ABNORMAL HIGH (ref 15–41)
Albumin: 3.8 g/dL (ref 3.5–5.0)
Alkaline Phosphatase: 61 U/L (ref 38–126)
Anion gap: 10 (ref 5–15)
BUN: 13 mg/dL (ref 6–20)
CO2: 25 mmol/L (ref 22–32)
Calcium: 9 mg/dL (ref 8.9–10.3)
Chloride: 106 mmol/L (ref 98–111)
Creatinine, Ser: 0.62 mg/dL (ref 0.61–1.24)
GFR, Estimated: >60 mL/min (ref >60)
Glucose, Bld: 104 mg/dL — ABNORMAL HIGH (ref 70–99)
Potassium: 3.9 mmol/L (ref 3.5–5.1)
Sodium: 141 mmol/L (ref 135–145)
Total Bilirubin: 1.2 mg/dL (ref 0.3–1.2)
Total Protein: 6.8 g/dL (ref 6.5–8.1)

## 2020-04-09 LAB — LIPASE, BLOOD: Lipase: 34 U/L (ref 11–51)

## 2020-04-09 MED ORDER — CYCLOBENZAPRINE HCL 10 MG PO TABS: 10.0000 mg | ORAL_TABLET | Freq: Three times a day (TID) | ORAL | 0 refills | Status: DC | PRN

## 2020-04-09 MED ORDER — ONDANSETRON 4 MG PO TBDP
4.0000 mg | ORAL_TABLET | Freq: Once | ORAL | Status: AC | PRN
  Administered 2020-04-09: 4 mg via ORAL
  Filled 2020-04-09: qty 1

## 2020-04-09 MED ORDER — ACETAMINOPHEN 500 MG PO TABS
1000.0000 mg | ORAL_TABLET | Freq: Once | ORAL | Status: AC
  Administered 2020-04-09: 1000 mg via ORAL
  Filled 2020-04-09: qty 2

## 2020-04-09 MED ORDER — PANTOPRAZOLE SODIUM 40 MG PO TBEC: 40.0000 mg | DELAYED_RELEASE_TABLET | Freq: Every day | ORAL | 0 refills | Status: DC

## 2020-04-09 NOTE — ED Notes (Signed)
Patient transported to X-ray 

## 2020-04-09 NOTE — ED Notes (Signed)
Pt not in room.

## 2020-04-09 NOTE — ED Provider Notes (Signed)
[facility] EMERGENCY DEPARTMENT Provider Note   CSN: [id number] Arrival date & time: 04/09/20  6606     History Chief Complaint  Patient presents with  . Nausea  . Shoulder Pain    Nathan [name] is a 50 y.o. male.  HPI 50 year old male presents with a chief complaint of left shoulder pain as well as nausea.  He states for the past 3 weeks or so he has been having pain in his left shoulder/trapezius as well as sometimes his left arm seems to be weak.  He has known cervical spine disease and was referred for surgery but wants to hold off.  However he states this feels a little different and is a little higher up than where his pain typically is.  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  No current numbness or current weakness.  He has been taking ibuprofen twice per day during this time.  He also has been waking up with nausea this seems to be worst in the morning.  No actual vomiting.  His abdomen is a little sore.  Some on and off chest pain but none right now.  No fevers or incontinence.   Past Medical History:  Diagnosis Date  . Arthritis   . Asthma    'I think I might have asthma"  . Chronic back pain   . Dyspnea    chronic  . Hyperlipidemia   . Sarcoidosis     Patient Active Problem List   Diagnosis Date Noted  . COVID-19 virus infection 10/21/2019  . Shoulder pain, left 10/23/2016  . Other chest pain 08/22/2014  . Upper airway cough syndrome 07/23/2014  . Sarcoidosis 03/06/2011    Past Surgical History:  Procedure Laterality Date  . NO PAST SURGERIES         Family History  Problem Relation Age of Onset  . Heart disease Maternal Grandfather   . Heart disease Paternal Uncle   . Prostate cancer Paternal Uncle   . Hypertension Mother   . Diabetes Mother   . Heart attack Father   . Diabetes Father   . Hypertension Father   . Colon polyps Sister   . Colon  cancer Neg Hx   . Esophageal cancer Neg Hx   . Rectal cancer Neg Hx   . Stomach cancer Neg Hx     Social History   Tobacco Use  . Smoking status: Former Smoker    Packs/day: 1.00    Years: 20.00    Pack years: 20.00    Types: Cigarettes    Quit date: 01/13/2011    Years since quitting: 9.2  . Smokeless tobacco: Never Used  Vaping Use  . Vaping Use: Never used  Substance Use Topics  . Alcohol use: No    Alcohol/week: 0.0 standard drinks  . Drug use: No    Home Medications Prior to Admission medications   Medication Sig Start Date End Date Taking? Authorizing Provider  albuterol (VENTOLIN HFA) 108 (90 Base) MCG/ACT inhaler Inhale 2 puffs into the lungs every 6 (six) hours as needed for wheezing or shortness of breath. 03/15/20   Collene Gobble, MD  budesonide-formoterol (SYMBICORT) 160-4.5 MCG/ACT inhaler Inhale 2 puffs into the lungs 2 (two) times daily.    [provider]  cetirizine (ZYRTEC) 10 MG tablet Take 10 mg by mouth daily. 01/15/20   [provider]  Cholecalciferol (VITAMIN D-3) 125 MCG (5000 UT) TABS Take 1 tablet by mouth daily. 01/21/20   Hilts, Legrand Como, MD  cyclobenzaprine (FLEXERIL) 10 MG tablet Take 1 tablet (10 mg total) by mouth 2 (two) times daily as needed for muscle spasms. 03/08/20   Lucrezia Starch, MD  diclofenac (VOLTAREN) 75 MG EC tablet Take 1 tablet (75 mg total) by mouth 2 (two) times daily. As needed for pain; take after eating 12/26/19   Fulp, Cammie, MD  fluticasone (FLONASE) 50 MCG/ACT nasal spray Place 1 spray into both nostrils 2 (two) times daily. 12/26/19   Fulp, Cammie, MD  ibuprofen (ADVIL) 600 MG tablet Take 600 mg by mouth every 6 (six) hours as needed. 01/07/20   [provider]  ibuprofen (ADVIL) 800 MG tablet Take 800 mg by mouth every 8 (eight) hours as needed. 01/07/20   [provider]  ipratropium (ATROVENT) 0.06 % nasal spray Place 2 sprays into both nostrils 4 (four) times daily as needed for  rhinitis. Patient not taking: Reported on 03/15/2020 12/26/19   Fulp, Cammie, MD  predniSONE (STERAPRED UNI-PAK 21 TAB) 10 MG (21) TBPK tablet Take by mouth daily. Take 6 tabs by mouth daily  for 1 days, then 5 tabs for 1 days, then 4 tabs for 1 days, then 3 tabs for 1 days, 2 tabs for 1 days, then 1 tab by mouth daily for 1 days 03/08/20   Lucrezia Starch, MD  pregabalin (LYRICA) 50 MG capsule 1 PO q HS, may increase to 1 PO BID if tolerated 02/25/20   Hilts, Michael, MD  rosuvastatin (CRESTOR) 5 MG tablet Take 1 tablet (5 mg total) by mouth daily. To lower cholesterol 12/26/19   Fulp, Cammie, MD  Tiotropium Bromide-Olodaterol (STIOLTO RESPIMAT) 2.5-2.5 MCG/ACT AERS Inhale 2 puffs into the lungs daily. Patient not taking: No sig reported 12/31/19   Collene Gobble, MD  traMADol (ULTRAM) 50 MG tablet Take 1 tablet (50 mg total) by mouth every 6 (six) hours as needed. 02/02/20   Luna Fuse, MD    Allergies    Percocet [oxycodone-acetaminophen] and Vicodin [hydrocodone-acetaminophen]  Review of Systems   Review of Systems  Constitutional: Negative for fever.  Respiratory: Positive for shortness of breath.   Cardiovascular: Positive for chest pain.  Gastrointestinal: Positive for abdominal pain and nausea. Negative for constipation, diarrhea and vomiting.  Musculoskeletal: Positive for back pain and neck pain.  Neurological: Negative for numbness.  All other systems reviewed and are negative.   Physical Exam Updated Vital Signs BP 120/80 (BP Location: Right Arm)   Pulse 75   Temp 98.1 F (36.7 C) (Oral)   Resp 20   SpO2 97%   Physical Exam Vitals and nursing note reviewed.  Constitutional:      Appearance: He is well-developed and well-nourished.  HENT:     Head: Normocephalic and atraumatic.     Right Ear: External ear normal.     Left Ear: External ear normal.     Nose: Nose normal.  Eyes:     General:        Right eye: No discharge.        Left eye: No discharge.   Neck:     Comments: Diffuse mild tenderness to left trapezius. Full passive/active ROM of left shoulder Mild midline lower neck/upper thoracic tenderness Cardiovascular:     Rate and Rhythm: Normal rate and regular rhythm.     Pulses:  Radial pulses are 2+ on the left side.     Heart sounds: Normal heart sounds.  Pulmonary:     Effort: Pulmonary effort is normal.     Breath sounds: Normal breath sounds.  Abdominal:     Palpations: Abdomen is soft.     Tenderness: There is abdominal tenderness (mild) in the epigastric area.  Musculoskeletal:        General: No edema.     Cervical back: Neck supple.  Skin:    General: Skin is warm and dry.  Neurological:     Mental Status: He is alert.     Comments: 5/5 strength in all 4 extremities. Grossly normal sensation  Psychiatric:        Mood and Affect: Mood is not anxious.     ED Results / Procedures / Treatments   Labs (all labs ordered are listed, but only abnormal results are displayed) Labs Reviewed  COMPREHENSIVE METABOLIC PANEL  LIPASE, BLOOD  CBC WITH DIFFERENTIAL/PLATELET  TROPONIN I (HIGH SENSITIVITY)    EKG None  Radiology No results found.  Procedures Procedures   Medications Ordered in ED Medications  acetaminophen (TYLENOL) tablet 1,000 mg (has no administration in time range)  ondansetron (ZOFRAN-ODT) disintegrating tablet 4 mg (has no administration in time range)    ED Course  I have reviewed the triage vital signs and the nursing notes.  Pertinent labs & imaging results that were available during my care of the patient were reviewed by me and considered in my medical decision making (see chart for details).    MDM Rules/Calculators/A&P                          Left shoulder/trapezius pain is likely from muscle spasm/strain from work. NV intact. LUQ pain could be gastritis given ibuprofen use. RUQ ultrasound overall unremarkable. Labs benign besides mild LFT elevation, will need follow up.  Troponin/ECG benign after weeks of symptoms. Do not think CT or emergent MRI needed. Will d/c with outpatient follow up with PCP and return precautions.  Final Clinical Impression(s) / ED Diagnoses Final diagnoses:  Upper abdominal pain    Rx / DC Orders ED Discharge Orders    None       Sherwood Gambler, MD 04/09/20 [phone]

## 2020-04-09 NOTE — Discharge Instructions (Signed)
Your Liver Function Tests are mildly elevated. You need to follow up with your primary care physician to get this checked and worked up  Do not take the prescribed medicine (flexeril) in combination with other meds, alcohol, and do not drive/operate heavy machinery while on this medication

## 2020-04-09 NOTE — ED Notes (Signed)
Patient discharge instructions reviewed with the patient. The patient verbalized understanding of instructions. Patient discharged. 

## 2020-04-09 NOTE — ED Triage Notes (Signed)
Pt reports L shoulder pain for approx 3 weeks. States the shoulder pain started when he started a new job. Pt also c/o nausea for approx 1 week. Denies emesis, fevers, diarrhea.

## 2020-04-16 ENCOUNTER — Encounter: Payer: 59 | Admitting: Nurse Practitioner

## 2020-04-20 ENCOUNTER — Emergency Department (HOSPITAL_COMMUNITY)
Admission: EM | Admit: 2020-04-20 | Discharge: 2020-04-20 | Disposition: A | Discharge status: Home / Self Care | Payer: Self-pay | Attending: Emergency Medicine | Admitting: Emergency Medicine

## 2020-04-20 ENCOUNTER — Emergency Department (HOSPITAL_COMMUNITY): Payer: Self-pay

## 2020-04-20 ENCOUNTER — Other Ambulatory Visit: Payer: Self-pay

## 2020-04-20 ENCOUNTER — Encounter (HOSPITAL_COMMUNITY): Payer: Self-pay

## 2020-04-20 DIAGNOSIS — M549 Dorsalgia, unspecified: Secondary | ICD-10-CM | POA: Insufficient documentation

## 2020-04-20 DIAGNOSIS — J45909 Unspecified asthma, uncomplicated: Secondary | ICD-10-CM | POA: Insufficient documentation

## 2020-04-20 DIAGNOSIS — Z7952 Long term (current) use of systemic steroids: Secondary | ICD-10-CM | POA: Insufficient documentation

## 2020-04-20 DIAGNOSIS — Z87891 Personal history of nicotine dependence: Secondary | ICD-10-CM | POA: Insufficient documentation

## 2020-04-20 DIAGNOSIS — R0789 Other chest pain: Secondary | ICD-10-CM | POA: Insufficient documentation

## 2020-04-20 DIAGNOSIS — Z8616 Personal history of COVID-19: Secondary | ICD-10-CM | POA: Insufficient documentation

## 2020-04-20 LAB — COMPREHENSIVE METABOLIC PANEL
ALT: 43 U/L (ref 0–44)
AST: 43 U/L — ABNORMAL HIGH (ref 15–41)
Albumin: 4.2 g/dL (ref 3.5–5.0)
Alkaline Phosphatase: 66 U/L (ref 38–126)
Anion gap: 5 (ref 5–15)
BUN: 13 mg/dL (ref 6–20)
CO2: 28 mmol/L (ref 22–32)
Calcium: 9.4 mg/dL (ref 8.9–10.3)
Chloride: 107 mmol/L (ref 98–111)
Creatinine, Ser: 0.72 mg/dL (ref 0.61–1.24)
GFR, Estimated: >60 mL/min (ref >60)
Glucose, Bld: 115 mg/dL — ABNORMAL HIGH (ref 70–99)
Potassium: 3.9 mmol/L (ref 3.5–5.1)
Sodium: 140 mmol/L (ref 135–145)
Total Bilirubin: 1.5 mg/dL — ABNORMAL HIGH (ref 0.3–1.2)
Total Protein: 7.7 g/dL (ref 6.5–8.1)

## 2020-04-20 LAB — CBC WITH DIFFERENTIAL/PLATELET
Abs Immature Granulocytes: 0.01 10*3/uL (ref 0.00–0.07)
Basophils Absolute: 0 10*3/uL (ref 0.0–0.1)
Basophils Relative: 0 %
Eosinophils Absolute: 0.2 10*3/uL (ref 0.0–0.5)
Eosinophils Relative: 5 %
HCT: 39.1 % (ref 39.0–52.0)
Hemoglobin: 12.9 g/dL — ABNORMAL LOW (ref 13.0–17.0)
Immature Granulocytes: 0 %
Lymphocytes Relative: 23 %
Lymphs Abs: 1.1 10*3/uL (ref 0.7–4.0)
MCH: 28.2 pg (ref 26.0–34.0)
MCHC: 33 g/dL (ref 30.0–36.0)
MCV: 85.6 fL (ref 80.0–100.0)
Monocytes Absolute: 0.5 10*3/uL (ref 0.1–1.0)
Monocytes Relative: 9 %
Neutro Abs: 3.1 10*3/uL (ref 1.7–7.7)
Neutrophils Relative %: 63 %
Platelets: 284 10*3/uL (ref 150–400)
RBC: 4.57 MIL/uL (ref 4.22–5.81)
RDW: 13.2 % (ref 11.5–15.5)
WBC: 4.9 10*3/uL (ref 4.0–10.5)
nRBC: 0 % (ref 0.0–0.2)

## 2020-04-20 LAB — TROPONIN I (HIGH SENSITIVITY): Troponin I (High Sensitivity): 7 ng/L (ref <18)

## 2020-04-20 MED ORDER — NAPROXEN 500 MG PO TABS: 500.0000 mg | ORAL_TABLET | Freq: Two times a day (BID) | ORAL | 0 refills | Status: DC

## 2020-04-20 NOTE — ED Triage Notes (Signed)
Patient c/o left back pain since this AM and states he continues to have left shoulder which he was seen for 3 weeks ago.

## 2020-04-20 NOTE — ED Provider Notes (Signed)
[facility] DEPT Provider Note   CSN: [id number] Arrival date & time: 04/20/20  1117     History Chief Complaint  Patient presents with  . Back Pain    Nathan [name] is a 50 y.o. male.  HPI   Patient is a 50 year old male who presents the emergency department due to left back pain as well as left-sided chest pain.  His symptoms started this morning upon waking.  He does note doing heavy lifting at work but denies any recent injuries to the chest or back.  States his symptoms worsen with palpation.  Otherwise, no modifying factors.  States his pain is constant and sharp.  9/10.  Reports history of hyperlipidemia but denies any other known cardiac history.  Reports chronic shortness of breath due to his history of sarcoidosis but denies this has acutely worsened.  No diaphoresis, nausea, vomiting, cough, URI symptoms, hemoptysis, leg swelling, history of blood clot, recent surgeries, hormone use.     Past Medical History:  Diagnosis Date  . Arthritis   . Asthma    'I think I might have asthma"  . Chronic back pain   . Dyspnea    chronic  . Hyperlipidemia   . Sarcoidosis     Patient Active Problem List   Diagnosis Date Noted  . COVID-19 virus infection 10/21/2019  . Shoulder pain, left 10/23/2016  . Other chest pain 08/22/2014  . Upper airway cough syndrome 07/23/2014  . Sarcoidosis 03/06/2011    Past Surgical History:  Procedure Laterality Date  . NO PAST SURGERIES         Family History  Problem Relation Age of Onset  . Heart disease Maternal Grandfather   . Heart disease Paternal Uncle   . Prostate cancer Paternal Uncle   . Hypertension Mother   . Diabetes Mother   . Heart attack Father   . Diabetes Father   . Hypertension Father   . Colon polyps Sister   . Colon cancer Neg Hx   . Esophageal cancer Neg Hx   . Rectal cancer Neg Hx   . Stomach cancer Neg Hx     Social History   Tobacco Use  . Smoking status: Former  Smoker    Packs/day: 1.00    Years: 20.00    Pack years: 20.00    Types: Cigarettes    Quit date: 01/13/2011    Years since quitting: 9.2  . Smokeless tobacco: Never Used  Vaping Use  . Vaping Use: Never used  Substance Use Topics  . Alcohol use: No    Alcohol/week: 0.0 standard drinks  . Drug use: No    Home Medications Prior to Admission medications   Medication Sig Start Date End Date Taking? Authorizing Provider  naproxen (NAPROSYN) 500 MG tablet Take 1 tablet (500 mg total) by mouth 2 (two) times daily with a meal. 04/20/20  Yes Rayna Sexton, PA-C  albuterol (VENTOLIN HFA) 108 (90 Base) MCG/ACT inhaler Inhale 2 puffs into the lungs every 6 (six) hours as needed for wheezing or shortness of breath. 03/15/20   Collene Gobble, MD  budesonide-formoterol (SYMBICORT) 160-4.5 MCG/ACT inhaler Inhale 2 puffs into the lungs 2 (two) times daily.    [provider]  cetirizine (ZYRTEC) 10 MG tablet Take 10 mg by mouth daily. 01/15/20   [provider]  Cholecalciferol (VITAMIN D-3) 125 MCG (5000 UT) TABS Take 1 tablet by mouth daily. 01/21/20   Hilts, Legrand Como, MD  cyclobenzaprine (FLEXERIL) 10 MG  tablet Take 1 tablet (10 mg total) by mouth 3 (three) times daily as needed for muscle spasms. 04/09/20   Sherwood Gambler, MD  fluticasone (FLONASE) 50 MCG/ACT nasal spray Place 1 spray into both nostrils 2 (two) times daily. 12/26/19   Fulp, Cammie, MD  ibuprofen (ADVIL) 600 MG tablet Take 600 mg by mouth every 6 (six) hours as needed. 01/07/20   [provider]  ibuprofen (ADVIL) 800 MG tablet Take 800 mg by mouth every 8 (eight) hours as needed. 01/07/20   [provider]  ipratropium (ATROVENT) 0.06 % nasal spray Place 2 sprays into both nostrils 4 (four) times daily as needed for rhinitis. Patient not taking: Reported on 03/15/2020 12/26/19   Fulp, Ander Gaster, MD  pantoprazole (PROTONIX) 40 MG tablet Take 1 tablet (40 mg total) by mouth daily. 04/09/20   Sherwood Gambler, MD  predniSONE (STERAPRED UNI-PAK 21 TAB) 10 MG (21) TBPK tablet Take by mouth daily. Take 6 tabs by mouth daily  for 1 days, then 5 tabs for 1 days, then 4 tabs for 1 days, then 3 tabs for 1 days, 2 tabs for 1 days, then 1 tab by mouth daily for 1 days 03/08/20   Lucrezia Starch, MD  pregabalin (LYRICA) 50 MG capsule 1 PO q HS, may increase to 1 PO BID if tolerated 02/25/20   Hilts, Michael, MD  rosuvastatin (CRESTOR) 5 MG tablet Take 1 tablet (5 mg total) by mouth daily. To lower cholesterol 12/26/19   Fulp, Cammie, MD  Tiotropium Bromide-Olodaterol (STIOLTO RESPIMAT) 2.5-2.5 MCG/ACT AERS Inhale 2 puffs into the lungs daily. Patient not taking: No sig reported 12/31/19   Collene Gobble, MD  traMADol (ULTRAM) 50 MG tablet Take 1 tablet (50 mg total) by mouth every 6 (six) hours as needed. 02/02/20   Luna Fuse, MD    Allergies    Percocet [oxycodone-acetaminophen] and Vicodin [hydrocodone-acetaminophen]  Review of Systems   Review of Systems  All other systems reviewed and are negative. Ten systems reviewed and are negative for acute change, except as noted in the HPI.   Physical Exam Updated Vital Signs BP (!) 108/48   Pulse 68   Temp 98.3 F (36.8 C) (Oral)   Resp (!) 22   Ht 5\' 8"  (1.727 m)   Wt 105.2 kg   SpO2 98%   BMI 35.28 kg/m   Physical Exam Vitals and nursing note reviewed.  Constitutional:      General: He is not in acute distress.    Appearance: Normal appearance. He is not ill-appearing, toxic-appearing or diaphoretic.  HENT:     Head: Normocephalic and atraumatic.     Right Ear: External ear normal.     Left Ear: External ear normal.     Nose: Nose normal.     Mouth/Throat:     Mouth: Mucous membranes are moist.     Pharynx: Oropharynx is clear. No oropharyngeal exudate or posterior oropharyngeal erythema.  Eyes:     General: No scleral icterus.       Right eye: No discharge.        Left eye: No discharge.     Extraocular Movements:  Extraocular movements intact.     Conjunctiva/sclera: Conjunctivae normal.  Cardiovascular:     Rate and Rhythm: Normal rate and regular rhythm.     Pulses: Normal pulses.     Heart sounds: Normal heart sounds. No murmur heard. No friction rub. No gallop.      Comments: Mild  diffuse left-sided anterior chest wall tenderness.  No crepitus.  No swelling. Pulmonary:     Effort: Pulmonary effort is normal. No respiratory distress.     Breath sounds: Normal breath sounds. No stridor. No wheezing, rhonchi or rales.  Abdominal:     General: Abdomen is flat.     Palpations: Abdomen is soft.     Tenderness: There is no abdominal tenderness.  Musculoskeletal:        General: Tenderness present. Normal range of motion.     Cervical back: Normal range of motion and neck supple. No tenderness.     Right lower leg: No edema.     Left lower leg: No edema.     Comments: Mild TTP noted diffusely along the left thoracic musculature.  No midline spine pain.  No leg swelling or calf pain.  Skin:    General: Skin is warm and dry.  Neurological:     General: No focal deficit present.     Mental Status: He is alert and oriented to person, place, and time.  Psychiatric:        Mood and Affect: Mood normal.        Behavior: Behavior normal.    ED Results / Procedures / Treatments   Labs (all labs ordered are listed, but only abnormal results are displayed) Labs Reviewed  COMPREHENSIVE METABOLIC PANEL - Abnormal; Notable for the following components:      Result Value   Glucose, Bld 115 (*)    AST 43 (*)    Total Bilirubin 1.5 (*)    All other components within normal limits  CBC WITH DIFFERENTIAL/PLATELET - Abnormal; Notable for the following components:   Hemoglobin 12.9 (*)    All other components within normal limits  TROPONIN I (HIGH SENSITIVITY)  TROPONIN I (HIGH SENSITIVITY)   EKG EKG Interpretation  Date/Time:  Tuesday April 20 2020 13:28:57 EST Ventricular Rate:  66 PR Interval:     QRS Duration: 100 QT Interval:  381 QTC Calculation: 400 R Axis:   36 Text Interpretation: Sinus rhythm Prolonged PR interval Baseline wander Artifact T wave abnormality Abnormal ECG Confirmed by Carmin Muskrat [phone]) on 04/20/2020 3:23:09 PM   Radiology DG Chest Portable 1 View  Result Date: 04/20/2020 CLINICAL DATA:  Chest pain EXAM: PORTABLE CHEST 1 VIEW COMPARISON:  April 09, 2020 FINDINGS: Stable elevation of the right hemidiaphragm. There is slight right base atelectasis. Mild scarring left upper lobe. No edema or airspace opacity. Heart size and pulmonary vascularity are normal. No adenopathy. No bone lesions. IMPRESSION: Stable elevation of right hemidiaphragm. Mild scarring left upper lobe. Atelectasis right base. No edema or airspace opacity. Heart size within normal limits. Electronically Signed   By: Lowella Grip III M.D.   On: 04/20/2020 14:14   Procedures Procedures   Medications Ordered in ED Medications - No data to display  ED Course  I have reviewed the triage vital signs and the nursing notes.  Pertinent labs & imaging results that were available during my care of the patient were reviewed by me and considered in my medical decision making (see chart for details).  Clinical Course as of 04/20/20 1530  Tue Apr 20, 2020  1454 DG Chest Portable 1 View IMPRESSION: Stable elevation of right hemidiaphragm. Mild scarring left upper lobe. Atelectasis right base. No edema or airspace opacity. Heart size within normal limits. [LJ]    Clinical Course User Index [LJ] Rayna Sexton, PA-C   MDM Rules/Calculators/A&P  Pt is a 50 y.o. male who presents the emergency department due to left-sided thoracic back pain as well as left anterior chest wall pain.  Labs: CBC with a hemoglobin of 12.9. CMP with a glucose of 115, AST of 43, total bilirubin of 1.5. Troponin of 7.  Imaging: Chest x-ray shows stable elevation of the right  hemidiaphragm.  There is mild scarring of the left upper lobe.  Atelectasis at the right base.  No edema or airspace opacity.  Heart size within normal limits.  I, Rayna Sexton, PA-C, personally reviewed and evaluated these images and lab results as part of my medical decision-making.  Patient's pain is reproducible with palpation.  Feel that his symptoms are likely musculoskeletal in nature.  PERC negative.  Heart score is 2.  Troponin of 7.  Will discharge on a course of naproxen.  Discussed return precautions.  Feel that he stable for discharge and he is agreeable.  His questions were answered and he was amicable at the time of discharge.  Note: Portions of this report may have been transcribed using voice recognition software. Every effort was made to ensure accuracy; however, inadvertent computerized transcription errors may be present.   Final Clinical Impression(s) / ED Diagnoses Final diagnoses:  Chest wall pain    Rx / DC Orders ED Discharge Orders         Ordered    naproxen (NAPROSYN) 500 MG tablet  2 times daily with meals        04/20/20 1527           Rayna Sexton, PA-C 04/20/20 1530    Carmin Muskrat, MD 04/23/20 2327

## 2020-04-20 NOTE — Discharge Instructions (Addendum)
Like we discussed, I believe you are experiencing chest wall pain.  I have attached some information on chest wall pain for you to reference.  I am going to prescribe you a course of anti-inflammatories called naproxen.  Please take this twice a day for the next 2 weeks.  Try to take these with a small amount of food because they can cause stomach irritation.  If you develop new or worsening symptoms, please return to the emergency department.  Otherwise, please follow-up with your regular doctor.

## 2020-04-20 NOTE — ED Notes (Signed)
An After Visit Summary was printed and given to the patient. Discharge instructions given and no further questions at this time.  

## 2020-04-30 ENCOUNTER — Encounter: Payer: Self-pay | Admitting: Physical Medicine & Rehabilitation

## 2020-06-23 DIAGNOSIS — J302 Other seasonal allergic rhinitis: Secondary | ICD-10-CM | POA: Insufficient documentation

## 2020-06-23 DIAGNOSIS — K219 Gastro-esophageal reflux disease without esophagitis: Secondary | ICD-10-CM | POA: Insufficient documentation

## 2020-06-28 ENCOUNTER — Encounter (HOSPITAL_COMMUNITY): Payer: Self-pay

## 2020-06-28 ENCOUNTER — Emergency Department (HOSPITAL_COMMUNITY)
Admission: EM | Admit: 2020-06-28 | Discharge: 2020-06-28 | Disposition: A | Discharge status: Home / Self Care | Payer: 59 | Attending: Emergency Medicine | Admitting: Emergency Medicine

## 2020-06-28 ENCOUNTER — Other Ambulatory Visit: Payer: Self-pay

## 2020-06-28 DIAGNOSIS — Z20822 Contact with and (suspected) exposure to covid-19: Secondary | ICD-10-CM | POA: Insufficient documentation

## 2020-06-28 DIAGNOSIS — R059 Cough, unspecified: Secondary | ICD-10-CM | POA: Diagnosis present

## 2020-06-28 DIAGNOSIS — Z8616 Personal history of COVID-19: Secondary | ICD-10-CM | POA: Insufficient documentation

## 2020-06-28 DIAGNOSIS — R040 Epistaxis: Secondary | ICD-10-CM | POA: Diagnosis not present

## 2020-06-28 DIAGNOSIS — Z87891 Personal history of nicotine dependence: Secondary | ICD-10-CM | POA: Diagnosis not present

## 2020-06-28 DIAGNOSIS — Z7951 Long term (current) use of inhaled steroids: Secondary | ICD-10-CM | POA: Diagnosis not present

## 2020-06-28 DIAGNOSIS — J45909 Unspecified asthma, uncomplicated: Secondary | ICD-10-CM | POA: Diagnosis not present

## 2020-06-28 DIAGNOSIS — J069 Acute upper respiratory infection, unspecified: Secondary | ICD-10-CM | POA: Diagnosis not present

## 2020-06-28 LAB — RESP PANEL BY RT-PCR (FLU A&B, COVID) ARPGX2
Influenza A by PCR: NEGATIVE
Influenza B by PCR: NEGATIVE
SARS Coronavirus 2 by RT PCR: NEGATIVE

## 2020-06-28 MED ORDER — BENZONATATE 200 MG PO CAPS: 200.0000 mg | ORAL_CAPSULE | Freq: Three times a day (TID) | ORAL | 0 refills | Status: AC

## 2020-06-28 NOTE — ED Triage Notes (Signed)
Patient reports sneezing, coughing, body aches, denies fever

## 2020-06-28 NOTE — ED Provider Notes (Signed)
[facility] EMERGENCY DEPARTMENT Provider Note   CSN: [id number] Arrival date & time: 06/28/20  0645     History Chief Complaint  Patient presents with  . URI    Nathan [name] is a 50 y.o. male.  50 year old male with complaint of dry cough, congestion, body aches for the past 2-3 days. States when he sneezes he gets a nose bleed only on the left. Patient went to his PCP who gave him allergy medicines and nose spray which he has been using. No known sick contacts. No other complaints or concerns.   Nathan [name] was evaluated in Emergency Department on 06/28/2020 for the symptoms described in the history of present illness. He was evaluated in the context of the global COVID-19 pandemic, which necessitated consideration that the patient might be at risk for infection with the SARS-CoV-2 virus that causes COVID-19. Institutional protocols and algorithms that pertain to the evaluation of patients at risk for COVID-19 are in a state of rapid change based on information released by regulatory bodies including the CDC and federal and state organizations. These policies and algorithms were followed during the patient's care in the ED.         Past Medical History:  Diagnosis Date  . Arthritis   . Asthma    'I think I might have asthma"  . Chronic back pain   . Dyspnea    chronic  . Hyperlipidemia   . Sarcoidosis     Patient Active Problem List   Diagnosis Date Noted  . COVID-19 virus infection 10/21/2019  . Shoulder pain, left 10/23/2016  . Other chest pain 08/22/2014  . Upper airway cough syndrome 07/23/2014  . Sarcoidosis 03/06/2011    Past Surgical History:  Procedure Laterality Date  . NO PAST SURGERIES         Family History  Problem Relation Age of Onset  . Heart disease Maternal Grandfather   . Heart disease Paternal Uncle   . Prostate cancer Paternal Uncle   . Hypertension Mother   . Diabetes Mother   . Heart attack Father    . Diabetes Father   . Hypertension Father   . Colon polyps Sister   . Colon cancer Neg Hx   . Esophageal cancer Neg Hx   . Rectal cancer Neg Hx   . Stomach cancer Neg Hx     Social History   Tobacco Use  . Smoking status: Former Smoker    Packs/day: 1.00    Years: 20.00    Pack years: 20.00    Types: Cigarettes    Quit date: 01/13/2011    Years since quitting: 9.4  . Smokeless tobacco: Never Used  Vaping Use  . Vaping Use: Never used  Substance Use Topics  . Alcohol use: No    Alcohol/week: 0.0 standard drinks  . Drug use: No    Home Medications Prior to Admission medications   Medication Sig Start Date End Date Taking? Authorizing Provider  albuterol (VENTOLIN HFA) 108 (90 Base) MCG/ACT inhaler Inhale 2 puffs into the lungs every 6 (six) hours as needed for wheezing or shortness of breath. 03/15/20   Collene Gobble, MD  amoxicillin (AMOXIL) 500 MG capsule TAKE 1 CAPSULE (500 MG TOTAL) BY MOUTH 3 (THREE) TIMES DAILY. 02/02/20 02/01/21  Luna Fuse, MD  budesonide-formoterol [facility]) 160-4.5 MCG/ACT inhaler Inhale 2 puffs into the lungs 2 (two) times daily.    [provider]  cetirizine (ZYRTEC) 10 MG  tablet Take 10 mg by mouth daily. 01/15/20   [provider]  Cholecalciferol (VITAMIN D-3) 125 MCG (5000 UT) TABS Take 1 tablet by mouth daily. 01/21/20   Hilts, Legrand Como, MD  cyclobenzaprine (FLEXERIL) 10 MG tablet Take 1 tablet (10 mg total) by mouth 3 (three) times daily as needed for muscle spasms. 04/09/20   Sherwood Gambler, MD  fluticasone (FLONASE) 50 MCG/ACT nasal spray Place 1 spray into both nostrils 2 (two) times daily. 12/26/19   Fulp, Cammie, MD  ibuprofen (ADVIL) 600 MG tablet Take 600 mg by mouth every 6 (six) hours as needed. 01/07/20   [provider]  ibuprofen (ADVIL) 800 MG tablet Take 800 mg by mouth every 8 (eight) hours as needed. 01/07/20   [provider]  ipratropium (ATROVENT) 0.06 % nasal spray Place 2 sprays  into both nostrils 4 (four) times daily as needed for rhinitis. Patient not taking: Reported on 03/15/2020 12/26/19   Antony Blackbird, MD  naproxen (NAPROSYN) 500 MG tablet Take 1 tablet (500 mg total) by mouth 2 (two) times daily with a meal. 04/20/20   Rayna Sexton, PA-C  pantoprazole (PROTONIX) 40 MG tablet Take 1 tablet (40 mg total) by mouth daily. 04/09/20   Sherwood Gambler, MD  predniSONE (STERAPRED UNI-PAK 21 TAB) 10 MG (21) TBPK tablet Take by mouth daily. Take 6 tabs by mouth daily  for 1 days, then 5 tabs for 1 days, then 4 tabs for 1 days, then 3 tabs for 1 days, 2 tabs for 1 days, then 1 tab by mouth daily for 1 days 03/08/20   Lucrezia Starch, MD  pregabalin (LYRICA) 50 MG capsule 1 PO q HS, may increase to 1 PO BID if tolerated 02/25/20   Hilts, Michael, MD  rosuvastatin (CRESTOR) 5 MG tablet Take 1 tablet (5 mg total) by mouth daily. To lower cholesterol 12/26/19   Fulp, Cammie, MD  Tiotropium Bromide-Olodaterol (STIOLTO RESPIMAT) 2.5-2.5 MCG/ACT AERS Inhale 2 puffs into the lungs daily. Patient not taking: No sig reported 12/31/19   Collene Gobble, MD  traMADol (ULTRAM) 50 MG tablet Take 1 tablet (50 mg total) by mouth every 6 (six) hours as needed. 02/02/20   Luna Fuse, MD  traMADol (ULTRAM) 50 MG tablet TAKE 1 TABLET BY MOUTH EVERY 6 HOURS AS NEEDED 02/02/20 08/02/20  Luna Fuse, MD    Allergies    Percocet [oxycodone-acetaminophen] and Vicodin [hydrocodone-acetaminophen]  Review of Systems   Review of Systems  Constitutional: Negative for chills and fever.  HENT: Positive for congestion, nosebleeds and sneezing. Negative for ear pain, sinus pressure, sinus pain and sore throat.   Respiratory: Positive for cough. Negative for shortness of breath.   Gastrointestinal: Negative for nausea and vomiting.  Musculoskeletal: Positive for arthralgias and myalgias.  Skin: Negative for rash and wound.  Allergic/Immunologic: Negative for immunocompromised state.  Neurological:  Negative for weakness.  Hematological: Negative for adenopathy.  Psychiatric/Behavioral: Negative for confusion.  All other systems reviewed and are negative.   Physical Exam Updated Vital Signs BP (!) 149/92 (BP Location: Left Arm)   Pulse 84   Temp 98 F (36.7 C) (Oral)   Resp 18   Ht 5\' 8"  (1.727 m)   Wt 106.6 kg   SpO2 96%   BMI 35.73 kg/m   Physical Exam Vitals and nursing note reviewed.  Constitutional:      General: He is not in acute distress.    Appearance: He is well-developed. He is not diaphoretic.  HENT:     Head: Normocephalic and atraumatic.     Right Ear: Tympanic membrane and ear canal normal.     Left Ear: Tympanic membrane and ear canal normal.     Nose: Congestion present.     Mouth/Throat:     Mouth: Mucous membranes are moist.     Pharynx: No oropharyngeal exudate or posterior oropharyngeal erythema.  Eyes:     Conjunctiva/sclera: Conjunctivae normal.  Cardiovascular:     Rate and Rhythm: Normal rate and regular rhythm.     Pulses: Normal pulses.     Heart sounds: Normal heart sounds.  Pulmonary:     Effort: Pulmonary effort is normal.     Breath sounds: Normal breath sounds.  Musculoskeletal:     Cervical back: Neck supple.  Lymphadenopathy:     Cervical: No cervical adenopathy.  Skin:    General: Skin is warm and dry.     Coloration: Skin is not pale.     Findings: No erythema or rash.  Neurological:     Mental Status: He is alert and oriented to person, place, and time.  Psychiatric:        Behavior: Behavior normal.     ED Results / Procedures / Treatments   Labs (all labs ordered are listed, but only abnormal results are displayed) Labs Reviewed  RESP PANEL BY RT-PCR (FLU A&B, COVID) [id number]    EKG None  Radiology No results found.  Procedures Procedures   Medications Ordered in ED Medications - No data to display  ED Course  I have reviewed the triage vital signs and the nursing notes.  Pertinent labs & imaging  results that were available during my care of the patient were reviewed by me and considered in my medical decision making (see chart for details).  Clinical Course as of 06/28/20 0277  Mon Jun 29, 3546  526 50 year old male with URI symptoms as above. No active epistaxis at this time. COVID/flu negative. Referred to ENT if nosebleeds continue, dc nose spray for now. Recheck with PCP if symptoms continue.  [LM]    Clinical Course User Index [LM] Roque Lias   MDM Rules/Calculators/A&P                          Final Clinical Impression(s) / ED Diagnoses Final diagnoses:  Viral upper respiratory tract infection  Epistaxis    Rx / DC Orders ED Discharge Orders    None       Tacy Learn, PA-C 06/28/20 4128    Sherwood Gambler, MD 06/29/20 1005

## 2020-06-28 NOTE — Discharge Instructions (Signed)
Discontinue use of nasal sprays for the time being. Nosebleeds continue, follow-up with the ear nose and throat doctor, referral given. Your doctor if symptoms continue. COVID and flu test are negative today.

## 2020-08-03 ENCOUNTER — Encounter: Payer: Self-pay | Admitting: Nurse Practitioner

## 2020-08-05 DIAGNOSIS — F411 Generalized anxiety disorder: Secondary | ICD-10-CM | POA: Insufficient documentation

## 2020-08-05 DIAGNOSIS — F329 Major depressive disorder, single episode, unspecified: Secondary | ICD-10-CM | POA: Insufficient documentation

## 2020-08-05 DIAGNOSIS — F410 Panic disorder [episodic paroxysmal anxiety] without agoraphobia: Secondary | ICD-10-CM | POA: Insufficient documentation

## 2020-08-10 DIAGNOSIS — G629 Polyneuropathy, unspecified: Secondary | ICD-10-CM | POA: Insufficient documentation

## 2020-08-30 ENCOUNTER — Encounter (HOSPITAL_COMMUNITY): Payer: Self-pay | Admitting: Emergency Medicine

## 2020-08-30 ENCOUNTER — Emergency Department (HOSPITAL_COMMUNITY): Payer: PRIVATE HEALTH INSURANCE

## 2020-08-30 ENCOUNTER — Emergency Department (HOSPITAL_COMMUNITY)
Admission: EM | Admit: 2020-08-30 | Discharge: 2020-08-31 | Disposition: A | Discharge status: Home / Self Care | Payer: PRIVATE HEALTH INSURANCE | Attending: Emergency Medicine | Admitting: Emergency Medicine

## 2020-08-30 ENCOUNTER — Other Ambulatory Visit: Payer: Self-pay

## 2020-08-30 DIAGNOSIS — Z7951 Long term (current) use of inhaled steroids: Secondary | ICD-10-CM | POA: Insufficient documentation

## 2020-08-30 DIAGNOSIS — J45909 Unspecified asthma, uncomplicated: Secondary | ICD-10-CM | POA: Insufficient documentation

## 2020-08-30 DIAGNOSIS — U071 COVID-19: Secondary | ICD-10-CM

## 2020-08-30 DIAGNOSIS — Z28310 Unvaccinated for covid-19: Secondary | ICD-10-CM | POA: Insufficient documentation

## 2020-08-30 DIAGNOSIS — Z87891 Personal history of nicotine dependence: Secondary | ICD-10-CM | POA: Insufficient documentation

## 2020-08-30 DIAGNOSIS — Z8616 Personal history of COVID-19: Secondary | ICD-10-CM | POA: Insufficient documentation

## 2020-08-30 LAB — COMPREHENSIVE METABOLIC PANEL
ALT: 34 U/L (ref 0–44)
AST: 33 U/L (ref 15–41)
Albumin: 3.9 g/dL (ref 3.5–5.0)
Alkaline Phosphatase: 67 U/L (ref 38–126)
Anion gap: 7 (ref 5–15)
BUN: 13 mg/dL (ref 6–20)
CO2: 26 mmol/L (ref 22–32)
Calcium: 8.6 mg/dL — ABNORMAL LOW (ref 8.9–10.3)
Chloride: 106 mmol/L (ref 98–111)
Creatinine, Ser: 0.68 mg/dL (ref 0.61–1.24)
GFR, Estimated: >60 mL/min (ref >60)
Glucose, Bld: 95 mg/dL (ref 70–99)
Potassium: 3.9 mmol/L (ref 3.5–5.1)
Sodium: 139 mmol/L (ref 135–145)
Total Bilirubin: 0.6 mg/dL (ref 0.3–1.2)
Total Protein: 7.1 g/dL (ref 6.5–8.1)

## 2020-08-30 LAB — CBC WITH DIFFERENTIAL/PLATELET
Abs Immature Granulocytes: 0.02 10*3/uL (ref 0.00–0.07)
Basophils Absolute: 0 10*3/uL (ref 0.0–0.1)
Basophils Relative: 0 %
Eosinophils Absolute: 0.3 10*3/uL (ref 0.0–0.5)
Eosinophils Relative: 6 %
HCT: 43.5 % (ref 39.0–52.0)
Hemoglobin: 14.2 g/dL (ref 13.0–17.0)
Immature Granulocytes: 0 %
Lymphocytes Relative: 22 %
Lymphs Abs: 1 10*3/uL (ref 0.7–4.0)
MCH: 27.6 pg (ref 26.0–34.0)
MCHC: 32.6 g/dL (ref 30.0–36.0)
MCV: 84.6 fL (ref 80.0–100.0)
Monocytes Absolute: 0.7 10*3/uL (ref 0.1–1.0)
Monocytes Relative: 15 %
Neutro Abs: 2.6 10*3/uL (ref 1.7–7.7)
Neutrophils Relative %: 57 %
Platelets: 238 10*3/uL (ref 150–400)
RBC: 5.14 MIL/uL (ref 4.22–5.81)
RDW: 13.5 % (ref 11.5–15.5)
WBC: 4.6 10*3/uL (ref 4.0–10.5)
nRBC: 0 % (ref 0.0–0.2)

## 2020-08-30 LAB — RESP PANEL BY RT-PCR (FLU A&B, COVID) ARPGX2
Influenza A by PCR: NEGATIVE
Influenza B by PCR: NEGATIVE
SARS Coronavirus 2 by RT PCR: POSITIVE — AB

## 2020-08-30 LAB — LIPASE, BLOOD: Lipase: 31 U/L (ref 11–51)

## 2020-08-30 NOTE — ED Provider Notes (Signed)
Emergency Medicine Provider Triage Evaluation Note  Nathan [name] , a 50 y.o. male  was evaluated in triage.  Pt complains of cough, body aches, and diarrhea x4 days. Coworker sick last week, unsure if he has COVID. No abdominal pain. Denies chest pain and shortness of breath. Admits to a productive cough with green phlegm. No nausea or vomiting. No fever, but admits to chills.  Review of Systems  Positive: Cough, myalgias, diarrhea Negative: N/V  Physical Exam  Ht 5\' 7"  (1.702 m)   Wt 108.4 kg   BMI 37.43 kg/m  Gen:   Awake, no distress   Resp:  Normal effort  MSK:   Moves extremities without difficulty  Other:    Medical Decision Making  Medically screening exam initiated at 5:28 PM.  Appropriate orders placed.  [name] was informed that the remainder of the evaluation will be completed by another provider, this initial triage assessment does not replace that evaluation, and the importance of remaining in the ED until their evaluation is complete.  Labs COVID test CXR due to productive cough   Karie Kirks 08/30/20 1730    Pattricia Boss, MD 08/31/20 [phone]

## 2020-08-30 NOTE — ED Triage Notes (Signed)
Onset 4 days ago developed dry cough and intermittent green sputum. States also have intermittent loose stools. General body achy 8/10. Last BM today loose denies nausea or vomiting.

## 2020-08-31 MED ORDER — NIRMATRELVIR/RITONAVIR (PAXLOVID)TABLET: 3.0000 | ORAL_TABLET | Freq: Two times a day (BID) | ORAL | 0 refills | Status: AC

## 2020-08-31 NOTE — ED Provider Notes (Signed)
[facility] EMERGENCY DEPARTMENT Provider Note   CSN: [id number] Arrival date & time: 08/30/20  1547     History Chief Complaint  Patient presents with   Diarrhea   Cough    Nathan [name] is a 50 y.o. male.  Patient with history of asthma/sarcoidosis, not on steroids or chronic immunomodulators, unvaccinated for COVID-19, 1 previous COVID-19 infection last year --presents the emergency department for evaluation of sneezing and coughing as well as diarrhea after being exposed to a coworker who was sick last week.  Today is day 4 of symptoms.  He had prolonged wait time in the ED waiting room.  No fevers.  No vomiting.  He denies chest pain or shortness of breath.  He has been taking OTC meds for symptom improvement.  Cough is productive at times.  The onset of this condition was acute. The course is constant. Aggravating factors: none. Alleviating factors: none.        Past Medical History:  Diagnosis Date   Arthritis    Asthma    'I think I might have asthma"   Chronic back pain    Dyspnea    chronic   Hyperlipidemia    Sarcoidosis     Patient Active Problem List   Diagnosis Date Noted   COVID-19 virus infection 10/21/2019   Shoulder pain, left 10/23/2016   Other chest pain 08/22/2014   Upper airway cough syndrome 07/23/2014   Sarcoidosis 03/06/2011    Past Surgical History:  Procedure Laterality Date   NO PAST SURGERIES         Family History  Problem Relation Age of Onset   Heart disease Maternal Grandfather    Heart disease Paternal Uncle    Prostate cancer Paternal Uncle    Hypertension Mother    Diabetes Mother    Heart attack Father    Diabetes Father    Hypertension Father    Colon polyps Sister    Colon cancer Neg Hx    Esophageal cancer Neg Hx    Rectal cancer Neg Hx    Stomach cancer Neg Hx     Social History   Tobacco Use   Smoking status: Former    Packs/day: 1.00    Years: 20.00    Pack years: 20.00     Types: Cigarettes    Quit date: 01/13/2011    Years since quitting: 9.6   Smokeless tobacco: Never  Vaping Use   Vaping Use: Never used  Substance Use Topics   Alcohol use: No    Alcohol/week: 0.0 standard drinks   Drug use: No    Home Medications Prior to Admission medications   Medication Sig Start Date End Date Taking? Authorizing Provider  nirmatrelvir/ritonavir EUA (PAXLOVID) TABS Take 3 tablets by mouth 2 (two) times daily for 5 days. Patient GFR is >60. Take nirmatrelvir (150 mg) two tablets twice daily for 5 days and ritonavir (100 mg) one tablet twice daily for 5 days. 08/31/20 09/05/20 Yes Carlisle Cater, PA-C  albuterol (VENTOLIN HFA) 108 (90 Base) MCG/ACT inhaler Inhale 2 puffs into the lungs every 6 (six) hours as needed for wheezing or shortness of breath. 03/15/20   Collene Gobble, MD  amoxicillin (AMOXIL) 500 MG capsule TAKE 1 CAPSULE (500 MG TOTAL) BY MOUTH 3 (THREE) TIMES DAILY. 02/02/20 02/01/21  Luna Fuse, MD  budesonide-formoterol [facility]) 160-4.5 MCG/ACT inhaler Inhale 2 puffs into the lungs 2 (two) times daily.    [provider]  cetirizine Alethia Berthold)  10 MG tablet Take 10 mg by mouth daily. 01/15/20   [provider]  Cholecalciferol (VITAMIN D-3) 125 MCG (5000 UT) TABS Take 1 tablet by mouth daily. 01/21/20   Hilts, Legrand Como, MD  cyclobenzaprine (FLEXERIL) 10 MG tablet Take 1 tablet (10 mg total) by mouth 3 (three) times daily as needed for muscle spasms. 04/09/20   Sherwood Gambler, MD  fluticasone (FLONASE) 50 MCG/ACT nasal spray Place 1 spray into both nostrils 2 (two) times daily. 12/26/19   Fulp, Cammie, MD  ibuprofen (ADVIL) 600 MG tablet Take 600 mg by mouth every 6 (six) hours as needed. 01/07/20   [provider]  ibuprofen (ADVIL) 800 MG tablet Take 800 mg by mouth every 8 (eight) hours as needed. 01/07/20   [provider]  ipratropium (ATROVENT) 0.06 % nasal spray Place 2 sprays into both nostrils 4 (four) times daily as  needed for rhinitis. Patient not taking: Reported on 03/15/2020 12/26/19   Antony Blackbird, MD  naproxen (NAPROSYN) 500 MG tablet Take 1 tablet (500 mg total) by mouth 2 (two) times daily with a meal. 04/20/20   Rayna Sexton, PA-C  pantoprazole (PROTONIX) 40 MG tablet Take 1 tablet (40 mg total) by mouth daily. 04/09/20   Sherwood Gambler, MD  predniSONE (STERAPRED UNI-PAK 21 TAB) 10 MG (21) TBPK tablet Take by mouth daily. Take 6 tabs by mouth daily  for 1 days, then 5 tabs for 1 days, then 4 tabs for 1 days, then 3 tabs for 1 days, 2 tabs for 1 days, then 1 tab by mouth daily for 1 days 03/08/20   Lucrezia Starch, MD  pregabalin (LYRICA) 50 MG capsule 1 PO q HS, may increase to 1 PO BID if tolerated 02/25/20   Hilts, Michael, MD  rosuvastatin (CRESTOR) 5 MG tablet Take 1 tablet (5 mg total) by mouth daily. To lower cholesterol 12/26/19   Fulp, Cammie, MD  Tiotropium Bromide-Olodaterol (STIOLTO RESPIMAT) 2.5-2.5 MCG/ACT AERS Inhale 2 puffs into the lungs daily. Patient not taking: No sig reported 12/31/19   Collene Gobble, MD  traMADol (ULTRAM) 50 MG tablet Take 1 tablet (50 mg total) by mouth every 6 (six) hours as needed. 02/02/20   Luna Fuse, MD    Allergies    Percocet [oxycodone-acetaminophen] and Vicodin [hydrocodone-acetaminophen]  Review of Systems   Review of Systems  Constitutional:  Negative for chills, fatigue and fever.  HENT:  Positive for sneezing. Negative for congestion, ear pain, rhinorrhea, sinus pressure and sore throat.   Eyes:  Negative for redness.  Respiratory:  Positive for cough. Negative for wheezing.   Gastrointestinal:  Positive for diarrhea. Negative for abdominal pain, nausea and vomiting.  Genitourinary:  Negative for dysuria.  Musculoskeletal:  Negative for myalgias and neck stiffness.  Skin:  Negative for rash.  Neurological:  Negative for headaches.  Hematological:  Negative for adenopathy.   Physical Exam Updated Vital Signs BP (!) 154/106    Pulse 78   Temp 97.6 F (36.4 C)   Resp 15   Ht 5\' 7"  (1.702 m)   Wt 108.4 kg   SpO2 98%   BMI 37.43 kg/m   Physical Exam Vitals and nursing note reviewed.  Constitutional:      General: He is not in acute distress.    Appearance: He is well-developed.  HENT:     Head: Normocephalic and atraumatic.  Eyes:     General:        Right eye: No discharge.  Left eye: No discharge.     Conjunctiva/sclera: Conjunctivae normal.  Cardiovascular:     Rate and Rhythm: Normal rate and regular rhythm.     Heart sounds: Normal heart sounds.  Pulmonary:     Effort: Pulmonary effort is normal.     Breath sounds: Normal breath sounds.  Abdominal:     Palpations: Abdomen is soft.     Tenderness: There is no abdominal tenderness.  Musculoskeletal:     Cervical back: Normal range of motion and neck supple.  Skin:    General: Skin is warm and dry.  Neurological:     Mental Status: He is alert.    ED Results / Procedures / Treatments   Labs (all labs ordered are listed, but only abnormal results are displayed) Labs Reviewed  RESP PANEL BY RT-PCR (FLU A&B, COVID) ARPGX2 - Abnormal; Notable for the following components:      Result Value   SARS Coronavirus 2 by RT PCR POSITIVE (*)    All other components within normal limits  COMPREHENSIVE METABOLIC PANEL - Abnormal; Notable for the following components:   Calcium 8.6 (*)    All other components within normal limits  CBC WITH DIFFERENTIAL/PLATELET  LIPASE, BLOOD    EKG None  Radiology DG Chest Portable 1 View  Result Date: 08/30/2020 CLINICAL DATA:  Onset intermittent productive cough 4 days ago. EXAM: PORTABLE CHEST 1 VIEW COMPARISON:  Single-view of the chest 04/20/2020. PA and lateral chest 09/17/2019. FINDINGS: Lung volumes are low and there is mild elevation of the right hemidiaphragm. The appearance is unchanged. The lungs are clear. Heart size is normal. No pneumothorax or pleural fluid. No acute or focal bony  abnormality. IMPRESSION: No acute disease. Electronically Signed   By: Inge Rise M.D.   On: 08/30/2020 18:07    Procedures Procedures   Medications Ordered in ED Medications - No data to display  ED Course  I have reviewed the triage vital signs and the nursing notes.  Pertinent labs & imaging results that were available during my care of the patient were reviewed by me and considered in my medical decision making (see chart for details).  Patient seen and examined. Work-up reviewed with patient at bedside.  He appears to be very well.  He does qualify for Paxlovid given BMI greater than 25, history of asthma and sarcoidosis, unvaccinated status.  Discussed risks and benefits of this medication including side effects with patient.  He would like to take this.  We will provide prescription.  Vital signs reviewed and are as follows: BP (!) 154/106   Pulse 78   Temp 97.6 F (36.4 C)   Resp 15   Ht 5\' 7"  (1.702 m)   Wt 108.4 kg   SpO2 98%   BMI 37.43 kg/m   Detailed discussion had with with patient regarding COVID-19 precautions and written instructions given as well.  We discussed need to isolate themselves for 5 days from onset of symptoms and have 24 hours of improvement prior to breaking isolation.  We discussed that when breaking isolation, mask wearing for 5 additional days is required.  We discussed signs symptoms to return which include worsening shortness of breath, trouble breathing, or increased work of breathing.  Also return with persistent vomiting, confusion, passing out, or if they have any other concerns. Counseled on the need for rest and good hydration. Discussed that high-risk contacts should be aware of positive result and they need to quarantine and be tested if they  develop any symptoms. Patient verbalizes understanding.   Nathan [name] was evaluated in Emergency Department on 08/31/2020 for the symptoms described in the history of present illness. He was  evaluated in the context of the global COVID-19 pandemic, which necessitated consideration that the patient might be at risk for infection with the SARS-CoV-2 virus that causes COVID-19. Institutional protocols and algorithms that pertain to the evaluation of patients at risk for COVID-19 are in a state of rapid change based on information released by regulatory bodies including the CDC and federal and state organizations. These policies and algorithms were followed during the patient's care in the ED.     MDM Rules/Calculators/A&P                          Well-appearing patient with COVID-19 infection, some risk factors for worsening.  Chest x-ray is clear without pneumonia.  Vital signs appear normal.  No decompensation during ED stay and prolonged wait.  Plan for discharge to home with antiviral medication as above.   Final Clinical Impression(s) / ED Diagnoses Final diagnoses:  [id number]    Rx / DC Orders ED Discharge Orders          Ordered    nirmatrelvir/ritonavir EUA (PAXLOVID) TABS  2 times daily        08/31/20 0931             Carlisle Cater, PA-C 08/31/20 2549    Sherwood Gambler, MD 08/31/20 1743

## 2020-08-31 NOTE — Discharge Instructions (Signed)
Please read and follow all provided instructions.  Your diagnoses today include:  1. COVID-19     Tests performed today include: Vital signs. See below for your results today.  COVID test - pending, check mychart for results Chest x-ray - clear Blood counts and electrolytes - appear okay  Medications prescribed:  Paxlovid - antiviral medication for COVID  Take any prescribed medications only as directed. Treatment for your infection is aimed at treating the symptoms. There are no medications, such as antibiotics, that will cure your infection.   Home care instructions:  Follow any educational materials contained in this packet.   Your illness is contagious and can be spread to others, especially during the first 3 or 4 days. It cannot be cured by antibiotics or other medicines. Take basic precautions such as washing your hands often, covering your mouth when you cough or sneeze, and avoiding public places where you could spread your illness to others.   Please continue drinking plenty of fluids.  Use over-the-counter medicines as needed as directed on packaging for symptom relief.  You may also use ibuprofen or tylenol as directed on packaging for pain or fever.  Do not take multiple medicines containing Tylenol or acetaminophen to avoid taking too much of this medication.  If you are positive for Covid-19, you should isolate yourself and not be exposed to other people for 5 days after your symptoms began. If you are not feeling better at day 5, you need to isolate yourself for a total of 10 days. If you are feeling better by day 5, you should wear a mask properly, over your nose and mouth, at all times while around other people until 10 days after your symptoms started.   Follow-up instructions: Please follow-up with your primary care provider as needed for further evaluation of your symptoms if you are not feeling better.   Return instructions:  Please return to the Emergency  Department if you experience worsening symptoms.  Return to the emergency department if you have worsening shortness of breath breathing or increased work of breathing, persistent vomiting RETURN IMMEDIATELY IF you develop shortness of breath, confusion or altered mental status, a new rash, become dizzy, faint, or poorly responsive, or are unable to be cared for at home. Please return if you have persistent vomiting and cannot keep down fluids or develop a fever that is not controlled by tylenol or motrin.   Please return if you have any other emergent concerns.  Additional Information:  Your vital signs today were: BP (!) 154/106   Pulse 78   Temp 97.6 F (36.4 C)   Resp 15   Ht 5\' 7"  (1.702 m)   Wt 108.4 kg   SpO2 98%   BMI 37.43 kg/m  If your blood pressure (BP) was elevated above 135/85 this visit, please have this repeated by your doctor within one month. --------------

## 2020-09-13 ENCOUNTER — Telehealth: Payer: Self-pay | Admitting: Emergency Medicine

## 2020-09-13 NOTE — Telephone Encounter (Signed)
Called and spoke with Patient.  Patient was concerned, because he was told by PCP of possible crackles in his lungs.  Patient denies any breathing issues, or distress at this time. Patient stated he had Covid 09/2019 and again around 2 weeks ago 08/30/20.  Patient is concerned about possible damage to his lungs. Patient stated he was told by Dr. Lamonte Sakai that he has sarcoidosis, but it was more skin related.  Patient stated he has notice increased bumps around his eyes and nose area.  Patient stated he does experience skin irritation, itching at times. Patient has OV with Tammy, NP 09/24/20 at 1200.  There are no earlier OV's available at this time. Advised Patient to keep scheduled OV with Tammy, NP, so she can assess his lungs and facial irritation. Understanding stated. Nothing further at this time.

## 2020-09-24 ENCOUNTER — Ambulatory Visit (INDEPENDENT_AMBULATORY_CARE_PROVIDER_SITE_OTHER): Payer: Self-pay | Admitting: Adult Health

## 2020-09-24 ENCOUNTER — Other Ambulatory Visit: Payer: Self-pay

## 2020-09-24 ENCOUNTER — Encounter: Payer: Self-pay | Admitting: Adult Health

## 2020-09-24 DIAGNOSIS — D869 Sarcoidosis, unspecified: Secondary | ICD-10-CM

## 2020-09-24 MED ORDER — PREDNISONE 10 MG PO TABS: ORAL_TABLET | ORAL | 0 refills | Status: DC

## 2020-09-24 NOTE — Progress Notes (Signed)
$'@Patient'M$  ID: Nathan [name], male    DOB: November 24, 1970, 50 y.o.   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 and abnormal CT chest with bilateral hilar and mediastinal adenopathy and upper lobe nodularity and scarring COVID-19 infection in August 2021 and July 2022   TEST/EVENTS :  CT chest February 02, 2011 showed diffuse mediastinal and hilar lymphadenopathy and scattered tree-in-bud infiltrates  CT chest December 2018 extensive mediastinal and bilateral hilar lymphadenopathy and pulmonary parenchymal disease  CT chest August 2021 stable symmetric bilateral hilar and diffuse mediastinal lymphadenopathy, stable bilateral upper lobe predominant pulmonary nodularity and scarring  1/22 Pulmonary function testing - shows mixed obstruction and restriction on spirometry with a severe decrease in his FEV1 at 1.81 L (54% predicted).  No bronchodilator response.  Severely restricted total lung capacity with a normal RV.  His diffusion capacity is decreased and corrects to the normal range when adjusted for alveolar volume.  09/24/2020 Follow up : Sarcoid  Patient presents for a 7-monthfollow-up.  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  Serial CT has showed bilateral hilar and mediastinal adenopathy along with upper lobe nodularity and scarring consistent with sarcoidosis.  ACE level has been normal.  Patient has been treated with prednisone in the past.  Has been continued on Symbicort which he feels is helped with his breathing.  He has no significant cough or wheezing.  However he complains over the last month he has had skin breakdown on his face 1 lesion on his [name] and additional lesions on under  his eyes bilaterally.  He is also had a small hyperpigmented lesion develop on his inner right upper arm.  Lesions are mildly pruritic.  He denies any fever.  He does have mild intermittent diffuse joint pain and stiffness at times.  Patient does not routinely follow-up with ophthalmology.  We discussed routine yearly follow-ups.  He denies any hemoptysis, weight loss, abdominal pain or nausea vomiting.     Allergies  Allergen Reactions   Percocet [Oxycodone-Acetaminophen] Itching    Able to take tylenol   Vicodin [Hydrocodone-Acetaminophen] Itching     There is no immunization history on file for this patient.  Past Medical History:  Diagnosis Date   Arthritis    Asthma    'I think I might have asthma"   Chronic back pain    Dyspnea    chronic   Hyperlipidemia    Sarcoidosis     Tobacco History: Social History   Tobacco Use  Smoking Status Former   Packs/day: 1.00   Years: 20.00   Pack years: 20.00   Types: Cigarettes   Quit date: 01/13/2011   Years since quitting: 9.7  Smokeless Tobacco Never   Counseling given: Not Answered   Outpatient Medications Prior to Visit  Medication Sig Dispense Refill   albuterol (VENTOLIN HFA) 108 (90 Base) MCG/ACT inhaler Inhale 2 puffs into the lungs every 6 (six) hours as needed for wheezing or shortness of breath. 18 g 5   budesonide-formoterol (SYMBICORT) 160-4.5 MCG/ACT inhaler Inhale 2 puffs into the lungs 2 (two) times daily.     cetirizine (ZYRTEC) 10 MG tablet Take 10 mg by mouth daily.     Cholecalciferol (VITAMIN D-3) 125 MCG (5000 UT) TABS Take 1  tablet by mouth daily. 90 tablet 3   cyclobenzaprine (FLEXERIL) 10 MG tablet Take 1 tablet (10 mg total) by mouth 3 (three) times daily as needed for muscle spasms. 15 tablet 0   fluticasone (FLONASE) 50 MCG/ACT nasal spray Place 1 spray into both nostrils 2 (two) times daily. 16 g 5   ibuprofen (ADVIL) 600 MG tablet Take 600 mg by mouth every 6 (six) hours as needed.      ibuprofen (ADVIL) 800 MG tablet Take 800 mg by mouth every 8 (eight) hours as needed.     ipratropium (ATROVENT) 0.06 % nasal spray Place 2 sprays into both nostrils 4 (four) times daily as needed for rhinitis. 15 mL 5   naproxen (NAPROSYN) 500 MG tablet Take 1 tablet (500 mg total) by mouth 2 (two) times daily with a meal. 30 tablet 0   pantoprazole (PROTONIX) 40 MG tablet Take 1 tablet (40 mg total) by mouth daily. 30 tablet 0   pregabalin (LYRICA) 50 MG capsule 1 PO q HS, may increase to 1 PO BID if tolerated 60 capsule 5   rosuvastatin (CRESTOR) 5 MG tablet Take 1 tablet (5 mg total) by mouth daily. To lower cholesterol 30 tablet 5   traMADol (ULTRAM) 50 MG tablet Take 1 tablet (50 mg total) by mouth every 6 (six) hours as needed. 15 tablet 0   amoxicillin (AMOXIL) 500 MG capsule TAKE 1 CAPSULE (500 MG TOTAL) BY MOUTH 3 (THREE) TIMES DAILY. (Patient not taking: Reported on 09/24/2020) 21 capsule 0   predniSONE (STERAPRED UNI-PAK 21 TAB) 10 MG (21) TBPK tablet Take by mouth daily. Take 6 tabs by mouth daily  for 1 days, then 5 tabs for 1 days, then 4 tabs for 1 days, then 3 tabs for 1 days, 2 tabs for 1 days, then 1 tab by mouth daily for 1 days (Patient not taking: Reported on 09/24/2020) 21 tablet 0   Tiotropium Bromide-Olodaterol (STIOLTO RESPIMAT) 2.5-2.5 MCG/ACT AERS Inhale 2 puffs into the lungs daily. (Patient not taking: Reported on 09/24/2020) 4 g 0   No facility-administered medications prior to visit.     Review of Systems:   Constitutional:   No  weight loss, night sweats,  Fevers, chills,  +fatigue, or  lassitude.  HEENT:   No headaches,  Difficulty swallowing,  Tooth/dental problems, or  Sore throat,                No sneezing, itching, ear ache, nasal congestion, post nasal drip,   CV:  No chest pain,  Orthopnea, PND, swelling in lower extremities, anasarca, dizziness, palpitations, syncope.   GI  No heartburn, indigestion, abdominal pain, nausea, vomiting, diarrhea, change in  bowel habits, loss of appetite, bloody stools.   Resp: No shortness of breath with exertion or at rest.  No excess mucus, no productive cough,  No non-productive cough,  No coughing up of blood.  No change in color of mucus.  No wheezing.  No chest wall deformity  Skin: + rash   GU: no dysuria, change in color of urine, no urgency or frequency.  No flank pain, no hematuria   MS:  +joint pain     Physical Exam  BP 130/78 (BP Location: Left Arm, Patient Position: Sitting, Cuff Size: Normal)   Pulse 77   Temp 98.1 F (36.7 C) (Oral)   Ht '5\' 7"'$  (1.702 m)   Wt 242 lb (109.8 kg)   SpO2 100%   BMI 37.90 kg/m   GEN: A/Ox3;  pleasant , NAD, well nourished    HEENT:  [place]/AT,  EACs-clear, TMs-wnl, [name]-clear, THROAT-clear, no lesions, no postnasal drip or exudate noted.   NECK:  Supple w/ fair ROM; no JVD; normal carotid impulses w/o bruits; no thyromegaly or nodules palpated; no lymphadenopathy.    RESP  Clear  P & A; w/o, wheezes/ rales/ or rhonchi. no accessory muscle use, no dullness to percussion  CARD:  RRR, no m/r/g, no peripheral edema, pulses intact, no cyanosis or clubbing.  GI:   Soft & nt; nml bowel sounds; no organomegaly or masses detected.   Musco: Warm bil, no deformities or joint swelling noted.   Neuro: alert, no focal deficits noted.    Skin: Warm, bilateral hyperpigmented raised papular lesions along lower eyelid.  Along right lateral nare small hyperpigmented lesion.  Right upper arm with small hyperpigmented papule.    Lab Results:  CBC    Component Value Date/Time   WBC 4.6 08/30/2020 1729   RBC 5.14 08/30/2020 1729   HGB 14.2 08/30/2020 1729   HCT 43.5 08/30/2020 1729   PLT 238 08/30/2020 1729   MCV 84.6 08/30/2020 1729   MCH 27.6 08/30/2020 1729   MCHC 32.6 08/30/2020 1729   RDW 13.5 08/30/2020 1729   LYMPHSABS 1.0 08/30/2020 1729   MONOABS 0.7 08/30/2020 1729   EOSABS 0.3 08/30/2020 1729   BASOSABS 0.0 08/30/2020 1729    BMET    Component  Value Date/Time   NA 139 08/30/2020 1729   K 3.9 08/30/2020 1729   CL 106 08/30/2020 1729   CO2 26 08/30/2020 1729   GLUCOSE 95 08/30/2020 1729   BUN 13 08/30/2020 1729   CREATININE 0.68 08/30/2020 1729   CALCIUM 8.6 (L) 08/30/2020 1729   GFRNONAA >60 08/30/2020 1729   GFRAA >60 09/17/2019 1502    BNP    Component Value Date/Time   BNP 18.2 04/10/2014 0811    ProBNP No results found for: PROBNP  Imaging: DG Chest Portable 1 View  Result Date: 08/30/2020 CLINICAL DATA:  Onset intermittent productive cough 4 days ago. EXAM: PORTABLE CHEST 1 VIEW COMPARISON:  Single-view of the chest 04/20/2020. PA and lateral chest 09/17/2019. FINDINGS: Lung volumes are low and there is mild elevation of the right hemidiaphragm. The appearance is unchanged. The lungs are clear. Heart size is normal. No pneumothorax or pleural fluid. No acute or focal bony abnormality. IMPRESSION: No acute disease. Electronically Signed   By: Inge Rise M.D.   On: 08/30/2020 18:07      PFT Results Latest Ref Rng & Units 03/15/2020 06/08/2017  FVC-Pre L 2.26 2.27  FVC-Predicted Pre % 55 54  FVC-Post L 2.24 1.92  FVC-Predicted Post % 54 46  Pre FEV1/FVC % % 80 80  Post FEV1/FCV % % 83 83  FEV1-Pre L 1.81 1.83  FEV1-Predicted Pre % 54 54  FEV1-Post L 1.86 1.58  DLCO uncorrected ml/min/mmHg 20.05 17.03  DLCO UNC% % 69 57  DLCO corrected ml/min/mmHg 20.79 -  DLCO COR %Predicted % 71 -  DLVA Predicted % 143 103  TLC L 4.12 3.87  TLC % Predicted % 61 57  RV % Predicted % 105 82    No results found for: NITRICOXIDE      Assessment & Plan:   Sarcoidosis Sarcoidosis with flare with skin lesions We will treat with short course of steroids. Encouraged to get yearly eye exams. Set up for CT chest without contrast Continue on Symbicort twice daily  Plan  Patient Instructions  Begin Prednisone '20mg'$  daily for 10 days then '10mg'$  daily for 10 days and then stop .  Continue on Symbicort 2 puffs Twice  daily  , rinse after use.  Follow up with Dr. Lamonte Sakai  in 4 -6 weeks and As needed   Please contact office for sooner follow up if symptoms do not improve or worsen or seek emergency care        I spent  30 minutes dedicated to the care of this patient on the date of this encounter to include pre-visit review of records, face-to-face time with the patient discussing conditions above, post visit ordering of testing, clinical documentation with the electronic health record, making appropriate referrals as documented, and communicating necessary findings to members of the patients care team.    Rexene Edison, NP 09/24/2020

## 2020-09-24 NOTE — Patient Instructions (Addendum)
Begin Prednisone '20mg'$  daily for 10 days then '10mg'$  daily for 10 days and then stop .  Continue on Symbicort 2 puffs Twice daily  , rinse after use.  Follow up with Dr. Lamonte Sakai  in 4 -6 weeks and As needed   Please contact office for sooner follow up if symptoms do not improve or worsen or seek emergency care

## 2020-09-24 NOTE — Assessment & Plan Note (Signed)
Sarcoidosis with flare with skin lesions We will treat with short course of steroids. Encouraged to get yearly eye exams. Set up for CT chest without contrast Continue on Symbicort twice daily  Plan  Patient Instructions  Begin Prednisone '20mg'$  daily for 10 days then '10mg'$  daily for 10 days and then stop .  Continue on Symbicort 2 puffs Twice daily  , rinse after use.  Follow up with Dr. Lamonte Sakai  in 4 -6 weeks and As needed   Please contact office for sooner follow up if symptoms do not improve or worsen or seek emergency care

## 2020-10-13 ENCOUNTER — Other Ambulatory Visit: Payer: Self-pay

## 2020-10-13 ENCOUNTER — Encounter (HOSPITAL_COMMUNITY): Payer: Self-pay | Admitting: *Deleted

## 2020-10-13 ENCOUNTER — Emergency Department (HOSPITAL_COMMUNITY)
Admission: EM | Admit: 2020-10-13 | Discharge: 2020-10-13 | Disposition: A | Discharge status: Home / Self Care | Payer: Self-pay | Attending: Emergency Medicine | Admitting: Emergency Medicine

## 2020-10-13 DIAGNOSIS — J029 Acute pharyngitis, unspecified: Secondary | ICD-10-CM

## 2020-10-13 DIAGNOSIS — J069 Acute upper respiratory infection, unspecified: Secondary | ICD-10-CM | POA: Insufficient documentation

## 2020-10-13 DIAGNOSIS — Z87891 Personal history of nicotine dependence: Secondary | ICD-10-CM | POA: Insufficient documentation

## 2020-10-13 DIAGNOSIS — Z8616 Personal history of COVID-19: Secondary | ICD-10-CM | POA: Insufficient documentation

## 2020-10-13 DIAGNOSIS — U071 COVID-19: Secondary | ICD-10-CM | POA: Insufficient documentation

## 2020-10-13 DIAGNOSIS — J45909 Unspecified asthma, uncomplicated: Secondary | ICD-10-CM | POA: Insufficient documentation

## 2020-10-13 LAB — RESP PANEL BY RT-PCR (FLU A&B, COVID) ARPGX2
Influenza A by PCR: NEGATIVE
Influenza B by PCR: NEGATIVE
SARS Coronavirus 2 by RT PCR: NEGATIVE

## 2020-10-13 NOTE — ED Provider Notes (Signed)
[facility] DEPT Provider Note   CSN: [id number] Arrival date & time: 10/13/20  1321     History Chief Complaint  Patient presents with   Sore Throat   Cough    [name] is a 50 y.o. male with a past medical history of sarcoidosis and asthma who presented today with a complaint of sneezing, cough and throat burning that began on Friday.  He reports that he was recently treated with prednisone for his sarcoidosis skin manifestations.  His primary told him that his current symptoms could be related to the prednisone.  He denies any fevers, chills, chest pain or difficulty breathing.  Has not had any belly pain or changes in his bowel movements.  Reports having had COVID twice, the most recent one being this July.  Past Medical History:  Diagnosis Date   Arthritis    Asthma    'I think I might have asthma"   Chronic back pain    Dyspnea    chronic   Hyperlipidemia    Sarcoidosis     Patient Active Problem List   Diagnosis Date Noted   COVID-19 virus infection 10/21/2019   Shoulder pain, left 10/23/2016   Other chest pain 08/22/2014   Upper airway cough syndrome 07/23/2014   Sarcoidosis 03/06/2011    Past Surgical History:  Procedure Laterality Date   NO PAST SURGERIES         Family History  Problem Relation Age of Onset   Heart disease Maternal Grandfather    Heart disease Paternal Uncle    Prostate cancer Paternal Uncle    Hypertension Mother    Diabetes Mother    Heart attack Father    Diabetes Father    Hypertension Father    Colon polyps Sister    Colon cancer Neg Hx    Esophageal cancer Neg Hx    Rectal cancer Neg Hx    Stomach cancer Neg Hx     Social History   Tobacco Use   Smoking status: Former    Packs/day: 1.00    Years: 20.00    Pack years: 20.00    Types: Cigarettes    Quit date: 01/13/2011    Years since quitting: 9.7   Smokeless tobacco: Never  Vaping Use   Vaping Use: Never used   Substance Use Topics   Alcohol use: No    Alcohol/week: 0.0 standard drinks   Drug use: No    Home Medications Prior to Admission medications   Medication Sig Start Date End Date Taking? Authorizing Provider  albuterol (VENTOLIN HFA) 108 (90 Base) MCG/ACT inhaler Inhale 2 puffs into the lungs every 6 (six) hours as needed for wheezing or shortness of breath. 03/15/20   Collene Gobble, MD  budesonide-formoterol (SYMBICORT) 160-4.5 MCG/ACT inhaler Inhale 2 puffs into the lungs 2 (two) times daily.    [provider]  cetirizine (ZYRTEC) 10 MG tablet Take 10 mg by mouth daily. 01/15/20   [provider]  Cholecalciferol (VITAMIN D-3) 125 MCG (5000 UT) TABS Take 1 tablet by mouth daily. 01/21/20   Hilts, Legrand Como, MD  cyclobenzaprine (FLEXERIL) 10 MG tablet Take 1 tablet (10 mg total) by mouth 3 (three) times daily as needed for muscle spasms. 04/09/20   Sherwood Gambler, MD  fluticasone (FLONASE) 50 MCG/ACT nasal spray Place 1 spray into both nostrils 2 (two) times daily. 12/26/19   Fulp, Cammie, MD  ibuprofen (ADVIL) 600 MG tablet Take 600 mg by mouth every 6 (  six) hours as needed. 01/07/20   [provider]  ibuprofen (ADVIL) 800 MG tablet Take 800 mg by mouth every 8 (eight) hours as needed. 01/07/20   [provider]  ipratropium (ATROVENT) 0.06 % nasal spray Place 2 sprays into both nostrils 4 (four) times daily as needed for rhinitis. 12/26/19   Fulp, Cammie, MD  naproxen (NAPROSYN) 500 MG tablet Take 1 tablet (500 mg total) by mouth 2 (two) times daily with a meal. 04/20/20   Rayna Sexton, PA-C  pantoprazole (PROTONIX) 40 MG tablet Take 1 tablet (40 mg total) by mouth daily. 04/09/20   Sherwood Gambler, MD  predniSONE (DELTASONE) 10 MG tablet 2 tabs daily for 10 days then 1 tab daily for 10 days and stop . 09/24/20   Parrett, Fonnie Mu, NP  pregabalin (LYRICA) 50 MG capsule 1 PO q HS, may increase to 1 PO BID if tolerated 02/25/20   Hilts, Michael, MD   rosuvastatin (CRESTOR) 5 MG tablet Take 1 tablet (5 mg total) by mouth daily. To lower cholesterol 12/26/19   Fulp, Cammie, MD  traMADol (ULTRAM) 50 MG tablet Take 1 tablet (50 mg total) by mouth every 6 (six) hours as needed. 02/02/20   Luna Fuse, MD    Allergies    Percocet [oxycodone-acetaminophen] and Vicodin [hydrocodone-acetaminophen]  Review of Systems   Review of Systems  Constitutional:  Negative for chills, fatigue and fever.  HENT:  Positive for sneezing and sore throat. Negative for congestion and rhinorrhea.   Eyes:  Negative for pain and redness.  Respiratory:  Positive for cough. Negative for shortness of breath and wheezing.   Cardiovascular:  Negative for chest pain.  Gastrointestinal:  Negative for abdominal pain.  Neurological:  Negative for dizziness and light-headedness.  All other systems reviewed and are negative.  Physical Exam Updated Vital Signs BP 122/90 (BP Location: Right Arm)   Pulse 66   Temp 97.9 F (36.6 C) (Oral)   Resp 18   SpO2 97%   Physical Exam Vitals and nursing note reviewed.  Constitutional:      General: He is not in acute distress.    Appearance: Normal appearance.  HENT:     Head: Normocephalic and atraumatic.     Right Ear: Tympanic membrane normal.     Left Ear: Tympanic membrane normal.  Eyes:     General: No scleral icterus.    Conjunctiva/sclera: Conjunctivae normal.  Cardiovascular:     Rate and Rhythm: Normal rate and regular rhythm.  Pulmonary:     Effort: Pulmonary effort is normal. No respiratory distress.  Skin:    General: Skin is warm and dry.     Findings: No rash.  Neurological:     Mental Status: He is alert.  Psychiatric:        Mood and Affect: Mood normal.    ED Results / Procedures / Treatments   Labs (all labs ordered are listed, but only abnormal results are displayed) Labs Reviewed  RESP PANEL BY RT-PCR (FLU A&B, COVID) [id number]    EKG None  Radiology No results  found.  Procedures Procedures   Medications Ordered in ED Medications - No data to display  ED Course  I have reviewed the triage vital signs and the nursing notes.  Pertinent labs & imaging results that were available during my care of the patient were reviewed by me and considered in my medical decision making (see chart for details).    MDM Rules/Calculators/A&P Ralphie Ostling is  a 50 y.o. male with a past medical history of sarcoidosis and asthma who presented today with a complaint of sneezing, cough and throat burning that began on Friday.  Patient with no signs of ACS.  No hemoptysis.  PERC negative.  Still tolerating oral intake.  I suspect the patient to have some sort of viral illness.  No signs of infection.  His throat is clear with no signs of PTA, exudate or other obstruction.  Patient without any emergent condition and stable for discharge home.  Patient agreeable and hoping to be discharged.  He will check for his COVID results in his chart at home.  Return precautions discussed and attached to his discharge papers.  Final Clinical Impression(s) / ED Diagnoses Final diagnoses:  Sore throat  Viral upper respiratory tract infection    Rx / DC Orders Results and diagnoses were explained to the patient. Return precautions discussed in full. Patient had no additional questions and expressed complete understanding.     Darliss Ridgel 10/13/20 2159    Blanchie Dessert, MD 10/13/20 [phone]

## 2020-10-13 NOTE — ED Provider Notes (Signed)
Emergency Medicine Provider Triage Evaluation Note  Truc Hetland , a 50 y.o. male  was evaluated in triage.  Pt hx of sarcoidosis complaining of cough, sneezing, and sore throat.  He states that he has had COVID twice the last time being in July.  Throat burning began over the weekend.  Recently finished a course of prednisone.  Review of Systems  Positive: Sneezing, cough and throat burning. Negative: Fevers, chills, abd cp or sob  Physical Exam  BP (!) 130/96 (BP Location: Left Arm)   Pulse 72   Temp 97.9 F (36.6 C) (Oral)   Resp 18   SpO2 96%  Gen:   Awake, no distress   Resp:  Normal effort, lungs CTA MSK:   Moves extremities without difficulty  Other:  RRR  Medical Decision Making  Medically screening exam initiated at 1:53 PM.  Appropriate orders placed.  [name] was informed that the remainder of the evaluation will be completed by another provider, this initial triage assessment does not replace that evaluation, and the importance of remaining in the ED until their evaluation is complete.     Darliss Ridgel 10/13/20 1355    Luna Fuse, MD 10/18/20 [phone]

## 2020-10-13 NOTE — ED Triage Notes (Signed)
Pt complains of cough, sneezing, sore throat since this past weekend.

## 2020-10-13 NOTE — ED Notes (Signed)
An After Visit Summary was printed and given to the patient. Discharge instructions given and no further questions at this time.  

## 2020-10-13 NOTE — Discharge Instructions (Signed)
Your COVID test would not come back while you are in the emergency department.  You will see these results on your portal however someone will call you if they are positive.  Return to the emergency department if you are having crushing chest pain, shortness of breath or fever or you are unable to keep down with Tylenol and ibuprofen.

## 2020-11-10 ENCOUNTER — Ambulatory Visit (INDEPENDENT_AMBULATORY_CARE_PROVIDER_SITE_OTHER): Payer: 59 | Admitting: Emergency Medicine

## 2020-11-10 ENCOUNTER — Encounter: Payer: Self-pay | Admitting: Emergency Medicine

## 2020-11-10 ENCOUNTER — Other Ambulatory Visit: Payer: Self-pay

## 2020-11-10 VITALS — BP 125/82 | HR 80 | Temp 98.3°F | Ht 68.0 in | Wt 248.4 lb

## 2020-11-10 DIAGNOSIS — J45909 Unspecified asthma, uncomplicated: Secondary | ICD-10-CM | POA: Insufficient documentation

## 2020-11-10 DIAGNOSIS — J4520 Mild intermittent asthma, uncomplicated: Secondary | ICD-10-CM

## 2020-11-10 DIAGNOSIS — Z23 Encounter for immunization: Secondary | ICD-10-CM

## 2020-11-10 DIAGNOSIS — D869 Sarcoidosis, unspecified: Secondary | ICD-10-CM

## 2020-11-10 MED ORDER — BETAMETHASONE DIPROPIONATE 0.05 % EX CREA: TOPICAL_CREAM | Freq: Two times a day (BID) | CUTANEOUS | 0 refills | Status: DC

## 2020-11-10 NOTE — Assessment & Plan Note (Signed)
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  He is benefiting from Symbicort.  Plan to continue.  Occasional albuterol use.  We will do surveillance pulmonary function testing every 1 to 2 years.

## 2020-11-10 NOTE — Addendum Note (Signed)
Addended by: Gavin Potters R on: 11/10/2020 01:34 PM   Modules accepted: Orders

## 2020-11-10 NOTE — Progress Notes (Signed)
Subjective:    Patient ID: Nathan [name], male    DOB: May 24, 1970, 50 y.o.   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  Is been treated in the past for this with prednisone.  Has mediastinal lymphadenopathy, left upper and lower lobe pulmonary nodule disease and a chronically elevated right hemidiaphragm.  He also had COVID-19 pneumonia in August 2021.  I tried starting him on Stiolto at his last visit to see if he would get benefit. He was unable to obtain it but was able to get assistance obtaining Symbicort.  He reports that he is getting some benefit from it - does seem to help his SOB. He still has SOB with heavy exertion like running, working.  He is seeing orthopedics for significant disk disease.   Pulmonary function testing done today reviewed by me, shows mixed obstruction and restriction on spirometry with a severe decrease in his FEV1 at 1.81 L (54% predicted).  No bronchodilator response.  Severely restricted total lung capacity with a normal RV.  His diffusion capacity is decreased and corrects to the normal range when adjusted for alveolar volume.  ROV 11/10/20 --50 year old man with history of sarcoidosis, former tobacco with associated obstructive lung disease.  He was being treated with Symbicort. Uses albuterol about few times a week. Can have some exertional SOB with carrying heavy objects. No real cough.  He had asymptomatic COVID-19 in July 2022.  He had some flaring skin sx on his face and around eyes 09/24/20, treated with a short course of corticosteroids - some improvement but not full resolution.  Repeat CT chest planned but has not yet been performed.    Review of Systems As per HPI     Objective:   Physical Exam Vitals:   11/10/20 1157  BP: 125/82  Pulse: 80  Temp: 98.3 F (36.8 C)  TempSrc: Oral  SpO2: 98%  Weight: 248 lb 6.4 oz (112.7 kg)  Height: 5\' 8"  (1.727  m)   Gen: Pleasant, well-nourished, in no distress,  normal affect  ENT: No lesions,  mouth clear,  oropharynx clear, no postnasal drip  Neck: No JVD, no stridor  Lungs: No use of accessory muscles, no crackles or wheezing on normal respiration, no wheeze on forced expiration  Cardiovascular: RRR, heart sounds normal, no murmur or gallops, no peripheral edema  Musculoskeletal: No deformities, no cyanosis or clubbing  Neuro: alert, awake, non focal  Skin: Warm, dark slightly raised rash R bicep area.       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  He has had some rash recurrence on the [name] and face, responded to prednisone a few months ago.  He also has some waxing and waning isolated rash on his arms, legs.  The lesions on his arms do look like sarcoid.  We can use triamcinolone cream for this.  I may send him back to dermatology if we have poor control of his skin manifestations.  He needs a repeat CT scan of the chest to evaluate his lymphadenopathy, look for pulmonary parenchymal disease.  Asthma Obstructive lung disease associated with his sarcoidosis.  He is benefiting from Symbicort.  Plan to continue.  Occasional albuterol use.  We will do surveillance pulmonary function testing every 1 to 2 years.  Baltazar Apo, MD, PhD 11/10/2020, 12:29 PM [facility] [phone] or if no answer  [phone]  

## 2020-11-10 NOTE — Patient Instructions (Signed)
We will repeat your CT scan of the chest to evaluate your lymph nodes and presumed sarcoidosis Try using triamcinolone cream up to 3 times a day to treat your rash.  We will give you a prescription for this.  Do not use it near your eyes. We will repeat your lab work next time. Flu shot today Follow with Dr Lamonte Sakai next available after your CT scan so that we can review the results together.

## 2020-11-10 NOTE — Assessment & Plan Note (Signed)
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  He has had some rash recurrence on the nose and face, responded to prednisone a few months ago.  He also has some waxing and waning isolated rash on his arms, legs.  The lesions on his arms do look like sarcoid.  We can use triamcinolone cream for this.  I may send him back to dermatology if we have poor control of his skin manifestations.  He needs a repeat CT scan of the chest to evaluate his lymphadenopathy, look for pulmonary parenchymal disease.

## 2020-11-16 ENCOUNTER — Inpatient Hospital Stay: Admission: RE | Admit: 2020-11-16 | Payer: 59 | Source: Ambulatory Visit

## 2020-11-26 ENCOUNTER — Other Ambulatory Visit: Payer: Self-pay

## 2020-11-26 ENCOUNTER — Ambulatory Visit (INDEPENDENT_AMBULATORY_CARE_PROVIDER_SITE_OTHER)
Admission: RE | Admit: 2020-11-26 | Discharge: 2020-11-26 | Disposition: A | Discharge status: Home / Self Care | Payer: 59 | Source: Ambulatory Visit | Attending: Emergency Medicine | Admitting: Emergency Medicine

## 2020-11-26 DIAGNOSIS — D869 Sarcoidosis, unspecified: Secondary | ICD-10-CM

## 2020-11-26 MED ORDER — IOHEXOL 350 MG/ML SOLN
80.0000 mL | Freq: Once | INTRAVENOUS | Status: AC | PRN
  Administered 2020-11-26: 80 mL via INTRAVENOUS

## 2020-11-30 ENCOUNTER — Telehealth: Payer: Self-pay | Admitting: Emergency Medicine

## 2020-11-30 NOTE — Telephone Encounter (Signed)
Dr. Lamonte Sakai, please advise on the results of pt's recent CT. Thanks!

## 2020-11-30 NOTE — Telephone Encounter (Signed)
Please let him know that I reviewed - his CT chest is stable compared with 09/2019. Shows stable changes from his sarcoidosis. This is good news.

## 2020-11-30 NOTE — Telephone Encounter (Signed)
Called the pt and there was no answer- LMTCB    

## 2020-12-01 NOTE — Telephone Encounter (Signed)
I called the patient and gave him the results per Dr. Lamonte Sakai and he reports that he has started a new job and is going to call back after he is getting his new hours and will make a follow up then. Nothing further needed.

## 2020-12-15 ENCOUNTER — Encounter (HOSPITAL_COMMUNITY): Payer: Self-pay

## 2020-12-15 ENCOUNTER — Emergency Department (HOSPITAL_COMMUNITY)
Admission: EM | Admit: 2020-12-15 | Discharge: 2020-12-15 | Disposition: A | Discharge status: Home / Self Care | Payer: 59 | Attending: Emergency Medicine | Admitting: Emergency Medicine

## 2020-12-15 DIAGNOSIS — Z8616 Personal history of COVID-19: Secondary | ICD-10-CM | POA: Diagnosis not present

## 2020-12-15 DIAGNOSIS — Z87891 Personal history of nicotine dependence: Secondary | ICD-10-CM | POA: Diagnosis not present

## 2020-12-15 DIAGNOSIS — Z7951 Long term (current) use of inhaled steroids: Secondary | ICD-10-CM | POA: Diagnosis not present

## 2020-12-15 DIAGNOSIS — J45909 Unspecified asthma, uncomplicated: Secondary | ICD-10-CM | POA: Diagnosis not present

## 2020-12-15 DIAGNOSIS — M62838 Other muscle spasm: Secondary | ICD-10-CM | POA: Insufficient documentation

## 2020-12-15 DIAGNOSIS — R252 Cramp and spasm: Secondary | ICD-10-CM | POA: Diagnosis present

## 2020-12-15 LAB — I-STAT CHEM 8, ED
BUN: 10 mg/dL (ref 6–20)
Calcium, Ion: 1.11 mmol/L — ABNORMAL LOW (ref 1.15–1.40)
Chloride: 106 mmol/L (ref 98–111)
Creatinine, Ser: 0.6 mg/dL — ABNORMAL LOW (ref 0.61–1.24)
Glucose, Bld: 117 mg/dL — ABNORMAL HIGH (ref 70–99)
HCT: 40 % (ref 39.0–52.0)
Hemoglobin: 13.6 g/dL (ref 13.0–17.0)
Potassium: 4 mmol/L (ref 3.5–5.1)
Sodium: 144 mmol/L (ref 135–145)
TCO2: 27 mmol/L (ref 22–32)

## 2020-12-15 MED ORDER — MELOXICAM 7.5 MG PO TABS: 7.5000 mg | ORAL_TABLET | Freq: Every day | ORAL | 0 refills | Status: DC

## 2020-12-15 NOTE — ED Triage Notes (Signed)
Pt presents with c/o muscle cramps in his legs and arms. Pt reports he started a new job last week and is unsure if that is what has caused the cramps. Pt ambulatory to triage.

## 2020-12-15 NOTE — ED Provider Notes (Signed)
[facility] DEPT Provider Note   CSN: [id number] Arrival date & time: 12/15/20  1009     History Chief Complaint  Patient presents with   Muscle Cramps    Nathan [name] is a 50 y.o. male.  Patient presents to the emergency department today for evaluation of muscle cramps.  Patient states that he started a new job recently.  He states that he has been developing intermittent cramps in his bilateral thighs, to lesser extent in his arms at times.  He states that cramps hurt more when he moves in certain positions and when he gets in and out of his truck.  He uses heat on the areas which seems to help.  No other treatments.  Denies history of nausea and vomiting, diarrhea.  He is eating and drinking well.  He does not use diuretics or blood pressure medications.  He was worried about his potassium level being low.  No other complaints.  No urine changes, fever, or URI symptoms.      Past Medical History:  Diagnosis Date   Arthritis    Asthma    'I think I might have asthma"   Chronic back pain    Dyspnea    chronic   Hyperlipidemia    Sarcoidosis     Patient Active Problem List   Diagnosis Date Noted   Asthma 11/10/2020   COVID-19 virus infection 10/21/2019   Shoulder pain, left 10/23/2016   Other chest pain 08/22/2014   Upper airway cough syndrome 07/23/2014   Sarcoidosis 03/06/2011    Past Surgical History:  Procedure Laterality Date   NO PAST SURGERIES         Family History  Problem Relation Age of Onset   Heart disease Maternal Grandfather    Heart disease Paternal Uncle    Prostate cancer Paternal Uncle    Hypertension Mother    Diabetes Mother    Heart attack Father    Diabetes Father    Hypertension Father    Colon polyps Sister    Colon cancer Neg Hx    Esophageal cancer Neg Hx    Rectal cancer Neg Hx    Stomach cancer Neg Hx     Social History   Tobacco Use   Smoking status: Former    Packs/day: 1.00     Years: 20.00    Pack years: 20.00    Types: Cigarettes    Quit date: 01/13/2011    Years since quitting: 9.9   Smokeless tobacco: Never  Vaping Use   Vaping Use: Never used  Substance Use Topics   Alcohol use: No    Alcohol/week: 0.0 standard drinks   Drug use: No    Home Medications Prior to Admission medications   Medication Sig Start Date End Date Taking? Authorizing Provider  meloxicam (MOBIC) 7.5 MG tablet Take 1 tablet (7.5 mg total) by mouth daily. 12/15/20  Yes Carlisle Cater, PA-C  albuterol (VENTOLIN HFA) 108 (90 Base) MCG/ACT inhaler Inhale 2 puffs into the lungs every 6 (six) hours as needed for wheezing or shortness of breath. 03/15/20   Collene Gobble, MD  betamethasone dipropionate 0.05 % cream Apply topically 2 (two) times daily. 11/10/20   Collene Gobble, MD  budesonide-formoterol (SYMBICORT) 160-4.5 MCG/ACT inhaler Inhale 2 puffs into the lungs 2 (two) times daily.    [provider]  cetirizine (ZYRTEC) 10 MG tablet Take 10 mg by mouth daily. 01/15/20   [provider]  Cholecalciferol (VITAMIN  D-3) 125 MCG (5000 UT) TABS Take 1 tablet by mouth daily. 01/21/20   Hilts, Legrand Como, MD  cyclobenzaprine (FLEXERIL) 10 MG tablet Take 1 tablet (10 mg total) by mouth 3 (three) times daily as needed for muscle spasms. 04/09/20   Sherwood Gambler, MD  fluticasone (FLONASE) 50 MCG/ACT nasal spray Place 1 spray into both nostrils 2 (two) times daily. 12/26/19   Fulp, Cammie, MD  ipratropium (ATROVENT) 0.06 % nasal spray Place 2 sprays into both nostrils 4 (four) times daily as needed for rhinitis. 12/26/19   Fulp, Cammie, MD  pantoprazole (PROTONIX) 40 MG tablet Take 1 tablet (40 mg total) by mouth daily. 04/09/20   Sherwood Gambler, MD  predniSONE (DELTASONE) 10 MG tablet 2 tabs daily for 10 days then 1 tab daily for 10 days and stop . 09/24/20   Parrett, Fonnie Mu, NP  pregabalin (LYRICA) 50 MG capsule 1 PO q HS, may increase to 1 PO BID if tolerated 02/25/20   Hilts,  Michael, MD  rosuvastatin (CRESTOR) 5 MG tablet Take 1 tablet (5 mg total) by mouth daily. To lower cholesterol 12/26/19   Fulp, Ander Gaster, MD    Allergies    Percocet [oxycodone-acetaminophen] and Vicodin [hydrocodone-acetaminophen]  Review of Systems   Review of Systems  Constitutional:  Negative for fever.  HENT:  Negative for rhinorrhea and sore throat.   Eyes:  Negative for redness.  Respiratory:  Negative for cough.   Cardiovascular:  Negative for chest pain.  Gastrointestinal:  Negative for abdominal pain, diarrhea, nausea and vomiting.  Genitourinary:  Negative for dysuria and hematuria.  Musculoskeletal:  Positive for myalgias. Negative for gait problem.  Skin:  Negative for rash.  Neurological:  Negative for headaches.   Physical Exam Updated Vital Signs BP (!) 141/79   Pulse 80   Temp (!) 97.5 F (36.4 C) (Oral)   Resp 18   Ht 5\' 8"  (1.727 m)   Wt 108.9 kg   SpO2 98%   BMI 36.49 kg/m   Physical Exam Vitals and nursing note reviewed.  Constitutional:      Appearance: He is well-developed.  HENT:     Head: Normocephalic and atraumatic.  Eyes:     Conjunctiva/sclera: Conjunctivae normal.  Pulmonary:     Effort: No respiratory distress.  Musculoskeletal:     Cervical back: Normal range of motion and neck supple.     Comments: No muscular tenderness on exam of the lower extremities or upper extremities.  No swelling.  Limbs are neurovascularly intact.  Skin:    General: Skin is warm and dry.  Neurological:     Mental Status: He is alert.     Comments: Patient is able to stand from sitting position and walk across the room without difficulty.    ED Results / Procedures / Treatments   Labs (all labs ordered are listed, but only abnormal results are displayed) Labs Reviewed  I-STAT CHEM 8, ED - Abnormal; Notable for the following components:      Result Value   Creatinine, Ser 0.60 (*)    Glucose, Bld 117 (*)    Calcium, Ion 1.11 (*)    All other  components within normal limits    EKG None  Radiology No results found.  Procedures Procedures   Medications Ordered in ED Medications - No data to display  ED Course  I have reviewed the triage vital signs and the nursing notes.  Pertinent labs & imaging results that were available during my care of  the patient were reviewed by me and considered in my medical decision making (see chart for details).  Patient seen and examined.  Patient looks well.  Currently asymptomatic.  Will check electrolytes.  If these are okay, will give trial of daily NSAID.  We will have him continue conservative measures.  If symptoms are not improving after a week, encouraged him to follow-up with his PCP office for further evaluation.  Patient urged to return with worsening symptoms or other concerns. Patient verbalized understanding and agrees with plan.   Vital signs reviewed and are as follows: BP (!) 141/79   Pulse 80   Temp (!) 97.5 F (36.4 C) (Oral)   Resp 18   Ht 5\' 8"  (1.727 m)   Wt 108.9 kg   SpO2 98%   BMI 36.49 kg/m      MDM Rules/Calculators/A&P                           Patient here with muscle spasms of the bilateral lower thighs seeming to be triggered by exertion and movement and certain positions.  Lower extremities are neurovascularly intact here.  Low concern for rhabdomyolysis symptoms are intermittent.  No concern for DVT or PE as there is not swelling or other risk factors.  Symptoms very well could be associated with increase in activity due to recent new job.  Patient encouraged to monitor for worsening and follow-up as above.   Final Clinical Impression(s) / ED Diagnoses Final diagnoses:  Muscle spasms of both lower extremities    Rx / DC Orders ED Discharge Orders          Ordered    meloxicam (MOBIC) 7.5 MG tablet  Daily        12/15/20 1237             Carlisle Cater, PA-C 12/15/20 1254    Godfrey Pick, MD 12/15/20 1858

## 2020-12-15 NOTE — Discharge Instructions (Signed)
Please read and follow all provided instructions.  Your diagnoses today include:  1. Muscle spasms of both lower extremities    Tests performed today include: Electrolytes and kidney function: Appear normal, your blood sugar is slightly high Vital signs. See below for your results today.   Medications prescribed:  Meloxicam - anti-inflammatory pain medication  You have been prescribed an anti-inflammatory medication or NSAID. Take with food. Do not take aspirin, ibuprofen, or naproxen if taking this medication. Take smallest effective dose for the shortest duration needed for your pain. Stop taking if you experience stomach pain or vomiting.   Take any prescribed medications only as directed.  Home care instructions:  Follow any educational materials contained in this packet.  BE VERY CAREFUL not to take multiple medicines containing Tylenol (also called acetaminophen). Doing so can lead to an overdose which can damage your liver and cause liver failure and possibly death.   Follow-up instructions: Please follow-up with your primary care provider in the next 7 days for further evaluation of your symptoms if not feeling better.  Return instructions:  Please return to the Emergency Department if you experience worsening symptoms.  Return if you develop shortness of breath or chest pain. Please return if you have any other emergent concerns.  Additional Information:  Your vital signs today were: BP (!) 139/94 (BP Location: Left Arm)   Pulse 78   Temp (!) 97.5 F (36.4 C) (Oral)   Resp 19   Ht 5\' 8"  (1.727 m)   Wt 108.9 kg   SpO2 95%   BMI 36.49 kg/m  If your blood pressure (BP) was elevated above 135/85 this visit, please have this repeated by your doctor within one month. --------------

## 2020-12-15 NOTE — ED Notes (Signed)
An After Visit Summary was printed and given to the patient. Discharge instructions given and no further questions at this time.  

## 2020-12-22 ENCOUNTER — Ambulatory Visit (INDEPENDENT_AMBULATORY_CARE_PROVIDER_SITE_OTHER): Payer: 59 | Admitting: Primary Care

## 2020-12-22 DIAGNOSIS — D869 Sarcoidosis, unspecified: Secondary | ICD-10-CM

## 2020-12-22 NOTE — Patient Instructions (Addendum)
No showed

## 2020-12-22 NOTE — Progress Notes (Addendum)
Patient no showed for appointment

## 2020-12-22 NOTE — Assessment & Plan Note (Deleted)
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 CT chest showed stable pulmonary mediastinal manifestations of sarcoid. He has substantial adenopathy and scarring. No evidence of progression.

## 2021-02-16 ENCOUNTER — Encounter: Payer: Self-pay | Admitting: Primary Care

## 2021-02-16 ENCOUNTER — Ambulatory Visit (INDEPENDENT_AMBULATORY_CARE_PROVIDER_SITE_OTHER): Payer: 59 | Admitting: Primary Care

## 2021-02-16 ENCOUNTER — Other Ambulatory Visit: Payer: Self-pay

## 2021-02-16 VITALS — BP 122/80 | HR 84 | Temp 98.1°F | Ht 67.0 in | Wt 247.4 lb

## 2021-02-16 DIAGNOSIS — D869 Sarcoidosis, unspecified: Secondary | ICD-10-CM

## 2021-02-16 DIAGNOSIS — J4520 Mild intermittent asthma, uncomplicated: Secondary | ICD-10-CM

## 2021-02-16 MED ORDER — ALBUTEROL SULFATE HFA 108 (90 BASE) MCG/ACT IN AERS: 2.0000 | INHALATION_SPRAY | Freq: Four times a day (QID) | RESPIRATORY_TRACT | 5 refills | Status: DC | PRN

## 2021-02-16 MED ORDER — PREDNISONE 10 MG PO TABS: ORAL_TABLET | ORAL | 0 refills | Status: DC

## 2021-02-16 NOTE — Progress Notes (Signed)
'@Patient'  ID: Nathan [name], male    DOB: 20-Mar-1970, 51 y.o.   MRN: [id number]  Chief Complaint  Patient presents with   Follow-up    Sarcoid     Referring provider: [facility], [facility]  HPI:  51 year old male, former smoker quit in 2012 (20-pack-year history).  Past medical history significant for asthma, sarcoidosis, COVID-19 infection.  Patient of Dr. Lamonte Sakai, last seen in office on 11/10/2020.  Followed in Pulmonary clinic/ [facility]/ [place] / Clinical dx only - never bx   - Steroid trial 03/06/2011  For clinical dx only > partially  improved  - ESR  08/10/16  = 14  - new skin lesions tip of nostril on R  Since around summer/fall of 2017 > 09/21/2016 referred to derm for tissue dx did not go> referred again 10/23/2016   - Skin Bx R nasal 03/14/17  :  Pos  Granulomatous dermatitis - ESR 03/26/2017  = 16 - ACE level 03/26/2017  =  33  - PFT's  06/08/2017  FVC 2.27 (85%) no obst  with DLCO  105 % corrects to 132  % for alv volume    02/16/2021- Interim hx  Patient presents today for 1 month follow-up with CT imaging.  Patient had CT chest on 11/29/2020 that showed stable pulmonary mediastinal manifestations of sarcoid including substantial adenopathy along with scattered non-progressive scarring and nodularity. He experiences dyspnea symptoms and chest tightness with moderate-heavy exertion. He has an occasional productive cough with purulent mucus and chest discomfort. No active chest pain today in office. Currently dealing with flare up of his skin lesions/rash. He has been using steriod cream without significant improvement. He has chronic back d/t spondylosis. He does Biomedical scientist for work. He is compliant with Symbicort 1110mg two puffs twice daily. He uses albuterol hfa 1-2 times a week.    Allergies  Allergen Reactions   Percocet [Oxycodone-Acetaminophen] Itching    Able to take tylenol   Vicodin [Hydrocodone-Acetaminophen] Itching    Immunization History   Administered Date(s) Administered   Influenza,inj,Quad PF,6+ Mos 11/10/2020   PFIZER(Purple Top)SARS-COV-2 Vaccination 10/27/2020    Past Medical History:  Diagnosis Date   Arthritis    Asthma    'I think I might have asthma"   Chronic back pain    Dyspnea    chronic   Hyperlipidemia    Sarcoidosis     Tobacco History: Social History   Tobacco Use  Smoking Status Former   Packs/day: 1.00   Years: 20.00   Pack years: 20.00   Types: Cigarettes   Quit date: 01/13/2011   Years since quitting: 10.1  Smokeless Tobacco Never   Counseling given: Not Answered   Outpatient Medications Prior to Visit  Medication Sig Dispense Refill   betamethasone dipropionate 0.05 % cream Apply topically 2 (two) times daily. 30 g 0   budesonide-formoterol (SYMBICORT) 160-4.5 MCG/ACT inhaler Inhale 2 puffs into the lungs 2 (two) times daily.     cetirizine (ZYRTEC) 10 MG tablet Take 10 mg by mouth daily.     Cholecalciferol (VITAMIN D-3) 125 MCG (5000 UT) TABS Take 1 tablet by mouth daily. 90 tablet 3   fluticasone (FLONASE) 50 MCG/ACT nasal spray Place 1 spray into both nostrils 2 (two) times daily. 16 g 5   ipratropium (ATROVENT) 0.06 % nasal spray Place 2 sprays into both nostrils 4 (four) times daily as needed for rhinitis. 15 mL 5   pantoprazole (PROTONIX) 40 MG tablet Take 1 tablet (40 mg total)  by mouth daily. 30 tablet 0   rosuvastatin (CRESTOR) 5 MG tablet Take 1 tablet (5 mg total) by mouth daily. To lower cholesterol 30 tablet 5   albuterol (VENTOLIN HFA) 108 (90 Base) MCG/ACT inhaler Inhale 2 puffs into the lungs every 6 (six) hours as needed for wheezing or shortness of breath. 18 g 5   cyclobenzaprine (FLEXERIL) 10 MG tablet Take 1 tablet (10 mg total) by mouth 3 (three) times daily as needed for muscle spasms. (Patient not taking: Reported on 02/16/2021) 15 tablet 0   meloxicam (MOBIC) 7.5 MG tablet Take 1 tablet (7.5 mg total) by mouth daily. (Patient not taking: Reported on  02/16/2021) 10 tablet 0   pregabalin (LYRICA) 50 MG capsule 1 PO q HS, may increase to 1 PO BID if tolerated (Patient not taking: Reported on 02/16/2021) 60 capsule 5   predniSONE (DELTASONE) 10 MG tablet 2 tabs daily for 10 days then 1 tab daily for 10 days and stop . (Patient not taking: Reported on 02/16/2021) 30 tablet 0   No facility-administered medications prior to visit.      Review of Systems  Review of Systems  Constitutional: Negative.   HENT: Negative.    Respiratory:  Positive for cough, chest tightness and shortness of breath.   Cardiovascular:  Positive for chest pain.  Skin:  Positive for rash.    Physical Exam  BP 122/80 (BP Location: Left Arm, Cuff Size: Normal)    Pulse 84    Temp 98.1 F (36.7 C) (Oral)    Ht '5\' 7"'  (1.702 m)    Wt 247 lb 6.4 oz (112.2 kg)    SpO2 95%    BMI 38.75 kg/m  Physical Exam Constitutional:      General: He is not in acute distress.    Appearance: Normal appearance. He is obese. He is not ill-appearing.  HENT:     Head: Normocephalic and atraumatic.  Cardiovascular:     Rate and Rhythm: Normal rate and regular rhythm.  Pulmonary:     Effort: Pulmonary effort is normal.     Breath sounds: Normal breath sounds. No wheezing, rhonchi or rales.     Comments: CTA Musculoskeletal:        General: Normal range of motion.  Skin:    General: Skin is warm and dry.     Comments: Skin lesions around eyes, right elbow fold, neck/back, groin  Neurological:     General: No focal deficit present.     Mental Status: He is alert and oriented to person, place, and time. Mental status is at baseline.  Psychiatric:        Mood and Affect: Mood normal.        Behavior: Behavior normal.        Thought Content: Thought content normal.        Judgment: Judgment normal.     Lab Results:  CBC    Component Value Date/Time   WBC 4.6 08/30/2020 1729   RBC 5.14 08/30/2020 1729   HGB 13.6 12/15/2020 1233   HCT 40.0 12/15/2020 1233   PLT 238 08/30/2020  1729   MCV 84.6 08/30/2020 1729   MCH 27.6 08/30/2020 1729   MCHC 32.6 08/30/2020 1729   RDW 13.5 08/30/2020 1729   LYMPHSABS 1.0 08/30/2020 1729   MONOABS 0.7 08/30/2020 1729   EOSABS 0.3 08/30/2020 1729   BASOSABS 0.0 08/30/2020 1729    BMET    Component Value Date/Time   NA 144 12/15/2020 1233  K 4.0 12/15/2020 1233   CL 106 12/15/2020 1233   CO2 26 08/30/2020 1729   GLUCOSE 117 (H) 12/15/2020 1233   BUN 10 12/15/2020 1233   CREATININE 0.60 (L) 12/15/2020 1233   CALCIUM 8.6 (L) 08/30/2020 1729   GFRNONAA >60 08/30/2020 1729   GFRAA >60 09/17/2019 1502    BNP    Component Value Date/Time   BNP 18.2 04/10/2014 0811    ProBNP No results found for: PROBNP  Imaging: No results found.   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 CT chest in October 2022 showed stable pulmonary mediastinal manifestations of sarcoid. He has substantial adenopathy and scarring. No evidence of progression. He is experiencing intermittent dyspnea symptoms along with chest tightness/discomfort and cough.  He has active skin lesions that have not responded to prescription steroid cream. Checking ACE level. Sending in prednisone taper for acute respiratory symptoms. Needs annual eye exam. No echocardiogram on file. Referring to dermatology, opthalmology and cardiology.   Asthma - Continue Symbicort 110mg two puffs twice daily and prn albuterol 2 puffs every 4-6 hours for breakthrough sob/wheezing  40 mins spent on case: > 50% face to face   EMartyn Ehrich NP 02/16/2021

## 2021-02-16 NOTE — Assessment & Plan Note (Signed)
-   Continue Symbicort 144mcg two puffs twice daily and prn albuterol 2 puffs every 4-6 hours for breakthrough sob/wheezing

## 2021-02-16 NOTE — Patient Instructions (Addendum)
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 CT chest was stable, no progression Breathing test suggest asthma Continue Symbicort 163mcg two puffs twice daily  Make sure you have annual exam with opthalmology as sarcoid can effect your eyes/vision  Notify our office if you develop worsening respiratory symptoms including cough, chest pain or shortness of breath   Orders: ACE level / CBC   Referral: Dermatology re: sarcoid (done) Opthalmology re: sarcoid (done) Cardiology re: sarcoid (done)  Rx: Prednisone taper as directed (start after labs are drawn)  Follow-up: 3 month follow-up with Dr. Lamonte Sakai      Sarcoidosis Sarcoidosis is a disease that can cause inflammation in many areas of the body. It most often affects the lungs (pulmonary sarcoidosis). Sarcoidosis can also affect the lymph nodes, liver, eyes, skin, heart, or any other body tissue. Normally, cells that are part of the body's disease-fighting system (immune system) attack harmful substances in the body, such as germs. This immune system response causes inflammation. After the harmful substance is destroyed, the inflammation goes away. When you have sarcoidosis, your immune system causes inflammation even when there are no harmful substances, and the inflammation does not go away. Sarcoidosis also causes cells from your immune system to form small lumps (granulomas) in the affected area of your body. What are the causes? The exact cause of sarcoidosis is not known.  If you have a family history of this disease (genetic predisposition), the immune system response that leads to inflammation may be triggered by something in your environment, such as: Bacteria or viruses. Metals. Chemicals. Dust. Mold or mildew. What increases the risk? You may be more likely to develop this condition if you: Have a family history of the disease. Are African American. Are of Northern European descent. Are 2-82 years old. Are  male. Work as a Airline pilot. Work in an environment where you are exposed to metals, chemicals, mold or mildew, or insecticides. What are the signs or symptoms? Some people with sarcoidosis have no symptoms. Others have very mild symptoms. The symptoms usually depend on the organ that is affected. Sarcoidosis most often affects the lungs, which may lead to symptoms such as: Chest pain. Coughing. Wheezing. Shortness of breath. Other common symptoms include: Night sweats. Fever. Weight loss. Tiredness (fatigue). Swollen lymph nodes. Joint pain. How is this diagnosed? This condition may be diagnosed based on: Your symptoms and medical history. A physical exam. Imaging tests such as: Chest X-ray. CT scan. MRI. PET scan. Lung function tests. These tests evaluate your breathing and check for problems that may be related to sarcoidosis. A procedure to remove a tissue sample for testing (biopsy). You may have a biopsy of lung tissue if that is where you are having symptoms. You may have tests to check for any complications of the condition. These tests may include: Eye exams. MRI of the heart or brain. Echocardiogram. ECG (electrocardiogram). How is this treated? In some cases, sarcoidosis does not require a specific treatment because it causes no symptoms or only mild symptoms. If your symptoms bother you or are severe, you may be prescribed medicines to reduce inflammation or relieve symptoms. These medicines may include: Prednisone. This is a steroid that reduces inflammation related to sarcoidosis. Hydroxychloroquine. This may be used to treat sarcoidosis that affects the skin, eyes, or brain. Certain medicines that affect the immune system. These can help with sarcoidosis in the joints, eyes, skin, or lungs. Medicines that you breathe in (inhalers). Inhalers can help you breathe if sarcoidosis  affects your lungs. Follow these instructions at home:  Do not use any products that  contain nicotine or tobacco. These products include cigarettes, chewing tobacco, and vaping devices, such as e-cigarettes. If you need help quitting, ask your health care provider. Avoid secondhand smoke and irritating dust or chemicals. Stay indoors on days when air quality is poor in your area. Return to your normal activities as told by your health care provider. Ask your health care provider what activities are safe for you. Take or use over-the-counter and prescription medicines only as told by your health care provider. Keep all follow-up visits. This is important. Where to find more information National Heart, Lung, and Blood Institute: https://wilson-eaton.com/ Contact a health care provider if: You have vision problems. You have a dry cough that does not go away. You have an irregular heartbeat. You have pain or aches in your joints, hands, or feet. You have an unexplained rash. Get help right away if: You have chest pain. You have trouble breathing. These symptoms may represent a serious problem that is an emergency. Do not wait to see if the symptoms will go away. Get medical help right away. Call your local emergency services (911 in the [place] Do not drive yourself to the hospital. Summary Sarcoidosis is a disease that can cause inflammation in many body areas of the body. It most often affects the lungs (pulmonary sarcoidosis). It can also affect the lymph nodes, liver, eyes, skin, heart, or any other body tissue. When you have sarcoidosis, cells from your immune system form small lumps (granulomas) in the affected area of your body. Sarcoidosis sometimes does not require a specific treatment because it causes no symptoms or only mild symptoms. If your symptoms bother you or are severe, you may be prescribed medicines to reduce inflammation or relieve symptoms. This information is not intended to replace advice given to you by your health care provider. Make sure you discuss any questions  you have with your health care provider. Document Revised: 12/02/2019 Document Reviewed: 12/02/2019 Elsevier Patient Education  2022 [employer].

## 2021-02-16 NOTE — Assessment & Plan Note (Addendum)
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 CT chest in October 2022 showed stable pulmonary mediastinal manifestations of sarcoid. He has substantial adenopathy and scarring. No evidence of progression. He is experiencing intermittent dyspnea symptoms along with chest tightness/discomfort and cough.  He has active skin lesions that have not responded to prescription steroid cream. Checking ACE level. Sending in prednisone taper for acute respiratory symptoms. Needs annual eye exam. No echocardiogram on file. Referring to dermatology, opthalmology and cardiology.

## 2021-02-17 ENCOUNTER — Other Ambulatory Visit (INDEPENDENT_AMBULATORY_CARE_PROVIDER_SITE_OTHER): Payer: Self-pay

## 2021-02-17 DIAGNOSIS — D869 Sarcoidosis, unspecified: Secondary | ICD-10-CM

## 2021-02-17 DIAGNOSIS — J4520 Mild intermittent asthma, uncomplicated: Secondary | ICD-10-CM

## 2021-02-17 LAB — CBC WITH DIFFERENTIAL/PLATELET
Basophils Absolute: 0 K/uL (ref 0.0–0.1)
Basophils Relative: 0.7 % (ref 0.0–3.0)
Eosinophils Absolute: 0.3 K/uL (ref 0.0–0.7)
Eosinophils Relative: 5.1 % — ABNORMAL HIGH (ref 0.0–5.0)
HCT: 41.1 % (ref 39.0–52.0)
Hemoglobin: 13.3 g/dL (ref 13.0–17.0)
Lymphocytes Relative: 20.8 % (ref 12.0–46.0)
Lymphs Abs: 1.2 K/uL (ref 0.7–4.0)
MCHC: 32.4 g/dL (ref 30.0–36.0)
MCV: 84.2 fl (ref 78.0–100.0)
Monocytes Absolute: 0.6 K/uL (ref 0.1–1.0)
Monocytes Relative: 9.4 % (ref 3.0–12.0)
Neutro Abs: 3.8 K/uL (ref 1.4–7.7)
Neutrophils Relative %: 64 % (ref 43.0–77.0)
Platelets: 259 K/uL (ref 150.0–400.0)
RBC: 4.88 Mil/uL (ref 4.22–5.81)
RDW: 13.8 % (ref 11.5–15.5)
WBC: 5.9 K/uL (ref 4.0–10.5)

## 2021-02-18 LAB — ANGIOTENSIN CONVERTING ENZYME: Angiotensin-Converting Enzyme: 24 U/L (ref 9–67)

## 2021-02-21 ENCOUNTER — Telehealth: Payer: Self-pay | Admitting: Emergency Medicine

## 2021-02-22 NOTE — Telephone Encounter (Signed)
I called and spoke with the results and he voices understanding. Nothing further needed.

## 2021-02-22 NOTE — Telephone Encounter (Signed)
ACE level was normal. Eosinophils were 300, which is slightly elevated but has his baseline and due to allergic asthma.

## 2021-02-22 NOTE — Telephone Encounter (Signed)
Noted.   EW please advise on lab work. Thanks

## 2021-02-23 ENCOUNTER — Telehealth: Payer: Self-pay | Admitting: Primary Care

## 2021-02-23 NOTE — Telephone Encounter (Signed)
Pt's demographics has been printed and faxed to provided fax number. Nothing further needed.

## 2021-02-28 ENCOUNTER — Ambulatory Visit (INDEPENDENT_AMBULATORY_CARE_PROVIDER_SITE_OTHER): Payer: Self-pay | Admitting: Internal Medicine

## 2021-02-28 ENCOUNTER — Other Ambulatory Visit: Payer: Self-pay

## 2021-02-28 ENCOUNTER — Encounter: Payer: Self-pay | Admitting: *Deleted

## 2021-02-28 ENCOUNTER — Encounter: Payer: Self-pay | Admitting: Internal Medicine

## 2021-02-28 VITALS — BP 120/66 | HR 68 | Ht 67.0 in | Wt 246.8 lb

## 2021-02-28 DIAGNOSIS — D869 Sarcoidosis, unspecified: Secondary | ICD-10-CM

## 2021-02-28 DIAGNOSIS — R072 Precordial pain: Secondary | ICD-10-CM

## 2021-02-28 DIAGNOSIS — E785 Hyperlipidemia, unspecified: Secondary | ICD-10-CM

## 2021-02-28 DIAGNOSIS — I700 Atherosclerosis of aorta: Secondary | ICD-10-CM

## 2021-02-28 MED ORDER — ASPIRIN EC 81 MG PO TBEC: 81.0000 mg | DELAYED_RELEASE_TABLET | Freq: Every day | ORAL | 3 refills | Status: DC

## 2021-02-28 NOTE — Patient Instructions (Signed)
Medication Instructions:  Your physician has recommended you make the following change in your medication:  1.) start aspirin 81 mg (enteric coated) - take one tablet daily  *If you need a refill on your cardiac medications before your next appointment, please call your pharmacy*   Lab Work: None today   Testing/Procedures: Your physician has requested that you have an echocardiogram. Echocardiography is a painless test that uses sound waves to create images of your heart. It provides your doctor with information about the size and shape of your heart and how well your hearts chambers and valves are working. This procedure takes approximately one hour. There are no restrictions for this procedure.  Follow-Up: At [facility], you and your health needs are our priority.  As part of our continuing mission to provide you with exceptional heart care, we have created designated Provider Care Teams.  These Care Teams include your primary Cardiologist (physician) and Advanced Practice Providers (APPs -  Physician Assistants and Nurse Practitioners) who all work together to provide you with the care you need, when you need it.   Your next appointment:   6 month(s)  The format for your next appointment:   In Person  Provider:   Early Osmond, MD     Other Instructions

## 2021-02-28 NOTE — Progress Notes (Signed)
Cardiology Office Note:    Date:  02/28/2021   ID:  Nathan [name], DOB December 26, 1970, MRN [id number]  PCP:  [facility], [facility]   [facility] Providers Cardiologist:  Lenna Sciara, MD Referring MD: Martyn Ehrich, NP   Chief Complaint/Reason for Referral: Chest tightness and dyspnea  ASSESSMENT:    Sarcoidosis  Precordial pain  Hyperlipidemia, unspecified hyperlipidemia type  Aortic atherosclerosis ([place])    PLAN:    In order of problems listed above:  1.  This is being followed by patient's pulmonologist.  I will obtain an echocardiogram as a first evaluation.  The patient has no EKG abnormalities or (palpitations) VT/NSVT suggestive of sarcoid.  If needed to evaluate for cardiac involvement of sarcoid we could consider a cardiac MRI.  I will see him back in 6 months or earlier if needed.  I have asked him to contact me if the nature of his chest pain worsens or he has palpitations associated with lightheadedness.  2.  We will obtain a coronary CTA and echocardiogram to evaluate further.  3.  Given the presence of aortic atherosclerosis the patient should be on aspirin 81 mg daily; continue Crestor with a goal LDL of less than 70.  He will be having his cholesterol checked by his PCP in the next month or so.  4.  See discussion above.    Dispo:  No follow-ups on file.     Medication Adjustments/Labs and Tests Ordered: Current medicines are reviewed at length with the patient today.  Concerns regarding medicines are outlined above.   Tests Ordered: No orders of the defined types were placed in this encounter.   Medication Changes: No orders of the defined types were placed in this encounter.   History of Present Illness:    FOCUSED CARDIOVASCULAR PROBLEM LIST:   1.  Pulmonary sarcoidosis 2.  Asthma and emphysema 3.  Hyperlipidemia 4.  Aortic atherosclerosis  The patient is a 51 y.o. male with the indicated medical history here for  chest tightness and dyspnea.  The patient was recently seen by the pulmonary division.  He had reported chest pain and dyspnea.  He was treated for a flare of his sarcoidosis with prednisone taper.  This is greatly improved his dyspnea.  In terms of his chest symptoms he tells me he has chronic back pain from an injury he sustained.  He has fairly constant chest pain which is musculoskeletal in nature.  If he moves in a certain way the chest tightness will be exacerbated.  He does not seem to think that it is exacerbated by physical activity.  He works doing Occupational hygienist for [employer].  He denies any palpitations, presyncope, syncope, peripheral edema, paroxysmal nocturnal dyspnea, or orthopnea.  He has required no emergency room visits or hospitalizations.        Previous Medical History: Past Medical History:  Diagnosis Date   Arthritis    Asthma    'I think I might have asthma"   Chronic back pain    Dyspnea    chronic   Hyperlipidemia    Sarcoidosis      Current Medications: Current Meds  Medication Sig   albuterol (VENTOLIN HFA) 108 (90 Base) MCG/ACT inhaler Inhale 2 puffs into the lungs every 6 (six) hours as needed for wheezing or shortness of breath.   betamethasone dipropionate 0.05 % cream Apply topically 2 (two) times daily.   budesonide-formoterol (SYMBICORT) 160-4.5 MCG/ACT inhaler Inhale 2 puffs into the  lungs 2 (two) times daily.   cetirizine (ZYRTEC) 10 MG tablet Take 10 mg by mouth daily.   Cholecalciferol (VITAMIN D-3) 125 MCG (5000 UT) TABS Take 1 tablet by mouth daily.   fluticasone (FLONASE) 50 MCG/ACT nasal spray Place 1 spray into both nostrils 2 (two) times daily.   ipratropium (ATROVENT) 0.06 % nasal spray Place 2 sprays into both nostrils 4 (four) times daily as needed for rhinitis.   ipratropium-albuterol (DUONEB) 0.5-2.5 (3) MG/3ML SOLN    pantoprazole (PROTONIX) 40 MG tablet Take 1 tablet (40 mg total) by mouth daily.   predniSONE  (DELTASONE) 10 MG tablet 4 tabs for 2 days, then 3 tabs for 2 days, 2 tabs for 2 days, then 1 tab for 2 days, then stop   rosuvastatin (CRESTOR) 5 MG tablet Take 1 tablet (5 mg total) by mouth daily. To lower cholesterol     Allergies:    Percocet [oxycodone-acetaminophen] and Vicodin [hydrocodone-acetaminophen]   Social History:   Social History   Tobacco Use   Smoking status: Former    Packs/day: 1.00    Years: 20.00    Pack years: 20.00    Types: Cigarettes    Quit date: 01/13/2011    Years since quitting: 10.1   Smokeless tobacco: Never  Vaping Use   Vaping Use: Never used  Substance Use Topics   Alcohol use: No    Alcohol/week: 0.0 standard drinks   Drug use: No     Family Hx: Family History  Problem Relation Age of Onset   Heart disease Maternal Grandfather    Heart disease Paternal Uncle    Prostate cancer Paternal Uncle    Hypertension Mother    Diabetes Mother    Heart attack Father    Diabetes Father    Hypertension Father    Colon polyps Sister    Colon cancer Neg Hx    Esophageal cancer Neg Hx    Rectal cancer Neg Hx    Stomach cancer Neg Hx      Review of Systems:   Please see the history of present illness.    All other systems reviewed and are negative.     EKGs/Labs/Other Test Reviewed:    EKG:  EKG today: Sinus rhythm; prior EKG: Sinus rhythm  Prior CV studies: None performed   Imaging studies that I have independently reviewed today:  October 2022 CT scan 1. Stable pulmonary and mediastinal manifestations of sarcoidosis including substantial adenopathy along with scattered nonprogressive scarring and nodularity. 2.  Emphysema (ICD10-J43.9).  Aortic Atherosclerosis (ICD10-I70.0).  Recent Labs: 08/30/2020: ALT 34 12/15/2020: BUN 10; Creatinine, Ser 0.60; Potassium 4.0; Sodium 144 02/17/2021: Hemoglobin 13.3; Platelets 259.0   Recent Lipid Panel Lab Results  Component Value Date/Time   CHOL 172 03/09/2020 02:38 PM   TRIG 80  03/09/2020 02:38 PM   HDL 41 03/09/2020 02:38 PM   LDLCALC 116 (H) 03/09/2020 02:38 PM    Risk Assessment/Calculations:          Physical Exam:    VS:  BP 120/66    Pulse 68    Ht 5\' 7"  (1.702 m)    Wt 246 lb 12.8 oz (111.9 kg)    SpO2 96%    BMI 38.65 kg/m    Wt Readings from Last 3 Encounters:  02/28/21 246 lb 12.8 oz (111.9 kg)  02/16/21 247 lb 6.4 oz (112.2 kg)  12/15/20 240 lb (108.9 kg)    GENERAL:  No apparent distress, AOx3 HEENT:  No carotid  bruits, +2 carotid impulses, no scleral icterus CAR: RRR no murmurs, gallops, rubs, or thrills RES:  Clear to auscultation bilaterally ABD:  Soft, nontender, nondistended, positive bowel sounds x 4 VASC:  +2 radial pulses, +2 carotid pulses, palpable pedal pulses NEURO:  CN 2-12 grossly intact; motor and sensory grossly intact PSYCH:  No active depression or anxiety EXT:  No edema, ecchymosis, or cyanosis  Signed, Early Osmond, MD  02/28/2021 10:00 AM    [address], [place], [place]  [address] Phone: [phone]; Fax: [phone]   Note:  This document was prepared using Dragon voice recognition software and may include unintentional dictation errors.

## 2021-03-02 ENCOUNTER — Other Ambulatory Visit (INDEPENDENT_AMBULATORY_CARE_PROVIDER_SITE_OTHER): Payer: Self-pay | Admitting: *Deleted

## 2021-03-02 DIAGNOSIS — E785 Hyperlipidemia, unspecified: Secondary | ICD-10-CM

## 2021-03-02 DIAGNOSIS — R072 Precordial pain: Secondary | ICD-10-CM

## 2021-03-10 ENCOUNTER — Other Ambulatory Visit: Payer: Self-pay

## 2021-03-10 ENCOUNTER — Ambulatory Visit (HOSPITAL_COMMUNITY): Payer: Self-pay | Attending: Cardiology

## 2021-03-10 DIAGNOSIS — R072 Precordial pain: Secondary | ICD-10-CM | POA: Insufficient documentation

## 2021-03-10 DIAGNOSIS — I700 Atherosclerosis of aorta: Secondary | ICD-10-CM | POA: Insufficient documentation

## 2021-03-10 LAB — ECHOCARDIOGRAM COMPLETE
Area-P 1/2: 4.21 cm2
S' Lateral: 3 cm

## 2021-03-10 MED ORDER — PERFLUTREN LIPID MICROSPHERE
1.0000 mL | INTRAVENOUS | Status: AC | PRN
  Administered 2021-03-10: 2 mL via INTRAVENOUS

## 2021-03-11 ENCOUNTER — Telehealth: Payer: Self-pay | Admitting: Primary Care

## 2021-03-11 MED ORDER — PREDNISONE 20 MG PO TABS: 30.0000 mg | ORAL_TABLET | Freq: Every day | ORAL | 0 refills | Status: DC

## 2021-03-11 NOTE — Telephone Encounter (Signed)
Please have him start pred 30mg  daily and have him set up OV with RB in 3 weeks. If RB unavailable then have him see APP

## 2021-03-11 NOTE — Progress Notes (Signed)
Let him know echo shows normal heart function and no valve problems, good news.

## 2021-03-11 NOTE — Telephone Encounter (Signed)
Spoke to patient.  Patient feels that he has developed a sarcoid flare up. He completed course of prednisone that was prescribed 02/16/2021. C/o  facial sores around nose and eye area. Sob is baseline.  Denied f/c/s or additional sx.  He is concerned that he needs a daily dose of prednisone.  Dr. Lamonte Sakai, please advise. thanks

## 2021-03-11 NOTE — Telephone Encounter (Signed)
Patient is aware of recommendations and voiced his understanding.  Prednisone sent to preferred pharmacy.  Appt scheduled 04/05/2021 at 4:00. Nothing further needed at this time.

## 2021-03-16 ENCOUNTER — Telehealth: Payer: Self-pay | Admitting: Internal Medicine

## 2021-03-16 NOTE — Telephone Encounter (Signed)
Early Osmond, MD  03/11/2021  6:57 AM EST     Let him know echo shows normal heart function and no valve problems, good news.   Reviewed results with pt who was very grateful for the call and normal results.

## 2021-03-16 NOTE — Telephone Encounter (Signed)
Follow Up:       Patient would like Echo results from 03-10-21 please.

## 2021-03-16 NOTE — Progress Notes (Signed)
Er[place]  

## 2021-03-23 ENCOUNTER — Encounter: Payer: Self-pay | Admitting: Physical Medicine and Rehabilitation

## 2021-04-01 ENCOUNTER — Encounter (HOSPITAL_COMMUNITY): Payer: Self-pay

## 2021-04-01 ENCOUNTER — Emergency Department (HOSPITAL_COMMUNITY)
Admission: EM | Admit: 2021-04-01 | Discharge: 2021-04-01 | Disposition: A | Discharge status: Home / Self Care | Payer: Self-pay | Attending: Emergency Medicine | Admitting: Emergency Medicine

## 2021-04-01 ENCOUNTER — Other Ambulatory Visit: Payer: Self-pay

## 2021-04-01 DIAGNOSIS — M79604 Pain in right leg: Secondary | ICD-10-CM | POA: Insufficient documentation

## 2021-04-01 DIAGNOSIS — R252 Cramp and spasm: Secondary | ICD-10-CM | POA: Insufficient documentation

## 2021-04-01 DIAGNOSIS — Z7982 Long term (current) use of aspirin: Secondary | ICD-10-CM | POA: Insufficient documentation

## 2021-04-01 DIAGNOSIS — M79605 Pain in left leg: Secondary | ICD-10-CM | POA: Insufficient documentation

## 2021-04-01 LAB — COMPREHENSIVE METABOLIC PANEL
ALT: 45 U/L — ABNORMAL HIGH (ref 0–44)
AST: 31 U/L (ref 15–41)
Albumin: 3.9 g/dL (ref 3.5–5.0)
Alkaline Phosphatase: 65 U/L (ref 38–126)
Anion gap: 9 (ref 5–15)
BUN: 17 mg/dL (ref 6–20)
CO2: 25 mmol/L (ref 22–32)
Calcium: 8.6 mg/dL — ABNORMAL LOW (ref 8.9–10.3)
Chloride: 98 mmol/L (ref 98–111)
Creatinine, Ser: 0.63 mg/dL (ref 0.61–1.24)
GFR, Estimated: >60 mL/min (ref >60)
Glucose, Bld: 220 mg/dL — ABNORMAL HIGH (ref 70–99)
Potassium: 3.7 mmol/L (ref 3.5–5.1)
Sodium: 132 mmol/L — ABNORMAL LOW (ref 135–145)
Total Bilirubin: 1 mg/dL (ref 0.3–1.2)
Total Protein: 7 g/dL (ref 6.5–8.1)

## 2021-04-01 LAB — CBC WITH DIFFERENTIAL/PLATELET
Abs Immature Granulocytes: 0.09 10*3/uL — ABNORMAL HIGH (ref 0.00–0.07)
Basophils Absolute: 0 10*3/uL (ref 0.0–0.1)
Basophils Relative: 0 %
Eosinophils Absolute: 0.2 10*3/uL (ref 0.0–0.5)
Eosinophils Relative: 3 %
HCT: 43.8 % (ref 39.0–52.0)
Hemoglobin: 14.4 g/dL (ref 13.0–17.0)
Immature Granulocytes: 1 %
Lymphocytes Relative: 20 %
Lymphs Abs: 1.4 10*3/uL (ref 0.7–4.0)
MCH: 27.4 pg (ref 26.0–34.0)
MCHC: 32.9 g/dL (ref 30.0–36.0)
MCV: 83.4 fL (ref 80.0–100.0)
Monocytes Absolute: 0.6 10*3/uL (ref 0.1–1.0)
Monocytes Relative: 8 %
Neutro Abs: 4.7 10*3/uL (ref 1.7–7.7)
Neutrophils Relative %: 68 %
Platelets: 237 10*3/uL (ref 150–400)
RBC: 5.25 MIL/uL (ref 4.22–5.81)
RDW: 14.3 % (ref 11.5–15.5)
WBC: 7 10*3/uL (ref 4.0–10.5)
nRBC: 0 % (ref 0.0–0.2)

## 2021-04-01 LAB — MAGNESIUM: Magnesium: 1.9 mg/dL (ref 1.7–2.4)

## 2021-04-01 LAB — CK: Total CK: 185 U/L (ref 49–397)

## 2021-04-01 MED ORDER — METHOCARBAMOL 500 MG PO TABS: 500.0000 mg | ORAL_TABLET | Freq: Two times a day (BID) | ORAL | 0 refills | Status: DC

## 2021-04-01 MED ORDER — ACETAMINOPHEN 500 MG PO TABS
1000.0000 mg | ORAL_TABLET | Freq: Four times a day (QID) | ORAL | Status: DC | PRN
  Administered 2021-04-01: 1000 mg via ORAL
  Filled 2021-04-01: qty 2

## 2021-04-01 MED ORDER — ACETAMINOPHEN 500 MG PO TABS: 500.0000 mg | ORAL_TABLET | Freq: Four times a day (QID) | ORAL | 0 refills | Status: DC | PRN

## 2021-04-01 NOTE — ED Provider Notes (Signed)
[facility] DEPT Provider Note   CSN: [id number] Arrival date & time: 04/01/21  0636     History  Chief Complaint  Patient presents with   Leg Pain    Nathan [name] is a 51 y.o. male.  HPI     50 year old male comes in a chief complaint of leg cramps.  He has history of sarcoidosis, chronic back pain, hypertension and hyperlipidemia  Patient indicates that his been having achy legs for the last 3 to 4 days.  The pain is described as cramping pain.  Pain is below the knee and over his legs.  No history of similar symptoms.  Denies any nausea, vomiting, diarrhea and insist that he is hydrating himself well.  No history of DVT, PE and risk factors for the same.  Denies any substance use disorder, but does smoke a pack a day.  No skin discoloration.  Indicates that he is on his legs a lot at work.  Home Medications Prior to Admission medications   Medication Sig Start Date End Date Taking? Authorizing Provider  acetaminophen (TYLENOL) 500 MG tablet Take 1 tablet (500 mg total) by mouth every 6 (six) hours as needed. 04/01/21  Yes Varney Biles, MD  methocarbamol (ROBAXIN) 500 MG tablet Take 1 tablet (500 mg total) by mouth 2 (two) times daily. 04/01/21  Yes Varney Biles, MD  albuterol (VENTOLIN HFA) 108 (90 Base) MCG/ACT inhaler Inhale 2 puffs into the lungs every 6 (six) hours as needed for wheezing or shortness of breath. 02/16/21   Martyn Ehrich, NP  aspirin EC 81 MG tablet Take 1 tablet (81 mg total) by mouth daily. Swallow whole. 02/28/21   Early Osmond, MD  betamethasone dipropionate 0.05 % cream Apply topically 2 (two) times daily. 11/10/20   Collene Gobble, MD  budesonide-formoterol (SYMBICORT) 160-4.5 MCG/ACT inhaler Inhale 2 puffs into the lungs 2 (two) times daily.    [provider]  cetirizine (ZYRTEC) 10 MG tablet Take 10 mg by mouth daily. 01/15/20   [provider]  Cholecalciferol (VITAMIN D-3) 125 MCG (5000  UT) TABS Take 1 tablet by mouth daily. 01/21/20   Hilts, Legrand Como, MD  cyclobenzaprine (FLEXERIL) 10 MG tablet Take 1 tablet (10 mg total) by mouth 3 (three) times daily as needed for muscle spasms. Patient not taking: Reported on 02/16/2021 04/09/20   Sherwood Gambler, MD  fluticasone [facility]) 50 MCG/ACT nasal spray Place 1 spray into both nostrils 2 (two) times daily. 12/26/19   Fulp, Cammie, MD  ipratropium (ATROVENT) 0.06 % nasal spray Place 2 sprays into both nostrils 4 (four) times daily as needed for rhinitis. 12/26/19   Fulp, Cammie, MD  ipratropium-albuterol (DUONEB) 0.5-2.5 (3) MG/3ML SOLN     [provider]  meloxicam (MOBIC) 7.5 MG tablet Take 1 tablet (7.5 mg total) by mouth daily. Patient not taking: Reported on 02/16/2021 12/15/20   Carlisle Cater, PA-C  pantoprazole (PROTONIX) 40 MG tablet Take 1 tablet (40 mg total) by mouth daily. 04/09/20   Sherwood Gambler, MD  predniSONE (DELTASONE) 20 MG tablet Take 1.5 tablets (30 mg total) by mouth daily with breakfast. 03/11/21   Collene Gobble, MD  pregabalin (LYRICA) 50 MG capsule 1 PO q HS, may increase to 1 PO BID if tolerated Patient not taking: Reported on 02/16/2021 02/25/20   Hilts, Legrand Como, MD  rosuvastatin (CRESTOR) 5 MG tablet Take 1 tablet (5 mg total) by mouth daily. To lower cholesterol 12/26/19   Antony Blackbird, MD  Allergies    Percocet [oxycodone-acetaminophen] and Vicodin [hydrocodone-acetaminophen]    Review of Systems   Review of Systems  All other systems reviewed and are negative.  Physical Exam Updated Vital Signs BP 122/89 (BP Location: Left Arm)    Pulse 67    Temp 98.3 F (36.8 C) (Oral)    Resp 18    Ht 5\' 7"  (1.702 m)    Wt 111.6 kg    SpO2 95%    BMI 38.53 kg/m  Physical Exam Vitals and nursing note reviewed.  Constitutional:      Appearance: He is well-developed.  HENT:     Head: Atraumatic.  Cardiovascular:     Rate and Rhythm: Normal rate.  Pulmonary:     Effort: Pulmonary effort is normal.   Musculoskeletal:        General: Tenderness present. No swelling.     Cervical back: Neck supple.     Right lower leg: No edema.     Left lower leg: No edema.     Comments: Bilateral lower extremity skin is warm to touch, tenderness with palpation, but not worse than baseline.  Skin:    General: Skin is warm.  Neurological:     Mental Status: He is alert and oriented to person, place, and time.    ED Results / Procedures / Treatments   Labs (all labs ordered are listed, but only abnormal results are displayed) Labs Reviewed  COMPREHENSIVE METABOLIC PANEL - Abnormal; Notable for the following components:      Result Value   Sodium 132 (*)    Glucose, Bld 220 (*)    Calcium 8.6 (*)    ALT 45 (*)    All other components within normal limits  CBC WITH DIFFERENTIAL/PLATELET - Abnormal; Notable for the following components:   Abs Immature Granulocytes 0.09 (*)    All other components within normal limits  CK  MAGNESIUM    EKG None  Radiology No results found.  Procedures Procedures    Medications Ordered in ED Medications  acetaminophen (TYLENOL) tablet 1,000 mg (1,000 mg Oral Given 04/01/21 1102)    ED Course/ Medical Decision Making/ A&P                           Medical Decision Making Amount and/or Complexity of Data Reviewed Labs: ordered.  Risk OTC drugs. Prescription drug management.   51 year old comes in with chief complaint of leg cramps and pain.  Bilateral lower extremity discomfort over the past 3 to 4 days.  History is not indicated of any insensible fluid loss, or poor hydration.  Social history is reassuring.  No risk factors for PE, DVT and no signs of DVT on exam.  Differential diagnosis for bilateral lower extremity pain in someone with heavy tobacco use disorder includes venous insufficiency, claudication, electrolyte imbalance, myositis/rhabdo.   Plan is to get basic labs.  There is no signs of DVT.  Patient has warm skin, no discoloration  of the skin and 1+ dorsalis pedis.  Does not need emergent angiogram.  We will give him Tylenol, get basic labs and reassess. IV fluid management considered, however patient is tolerating orals well therefore we have advised that he drink more fluid while he is awaiting work-up.  12:39 PM The patient appears reasonably screened and/or stabilized for discharge and I doubt any other medical condition or other [facility] requiring further screening, evaluation, or treatment in the ED at this time prior  to discharge.   Results from the ER workup discussed with the patient face to face and all questions answered to the best of my ability. The patient is safe for discharge with strict return precautions.  Patient advised to follow-up with his PCP.  We will give him Tylenol and Robaxin for now.  Informed him that PCP might need to take more comprehensive work-up if the symptoms continue.  Patient's vascular exam is reassuring.  He indicates that he stopped smoking 12 years ago.  He does not have any claudication-like symptoms.  Final Clinical Impression(s) / ED Diagnoses Final diagnoses:  Leg cramping    Rx / DC Orders ED Discharge Orders          Ordered    acetaminophen (TYLENOL) 500 MG tablet  Every 6 hours PRN        04/01/21 1226    methocarbamol (ROBAXIN) 500 MG tablet  2 times daily        04/01/21 1226              Varney Biles, MD 04/01/21 1240

## 2021-04-01 NOTE — Discharge Instructions (Addendum)
Take Tylenol finder milligrams every 6 hours as needed and also the muscle relaxants as needed.  As discussed, it is prudent that you call your primary care doctor for further comprehensive work-up.

## 2021-04-01 NOTE — ED Triage Notes (Signed)
Patient c/o intermittent bilateral leg cramping x 2 days. Patient denies any leg swelling or injury.

## 2021-04-05 ENCOUNTER — Ambulatory Visit: Payer: Self-pay | Admitting: Emergency Medicine

## 2021-04-18 ENCOUNTER — Encounter (HOSPITAL_BASED_OUTPATIENT_CLINIC_OR_DEPARTMENT_OTHER): Payer: Self-pay | Admitting: Emergency Medicine

## 2021-04-18 ENCOUNTER — Emergency Department (HOSPITAL_BASED_OUTPATIENT_CLINIC_OR_DEPARTMENT_OTHER)
Admission: EM | Admit: 2021-04-18 | Discharge: 2021-04-18 | Disposition: A | Discharge status: Home / Self Care | Payer: Self-pay | Attending: Emergency Medicine | Admitting: Emergency Medicine

## 2021-04-18 ENCOUNTER — Other Ambulatory Visit: Payer: Self-pay

## 2021-04-18 DIAGNOSIS — R739 Hyperglycemia, unspecified: Secondary | ICD-10-CM

## 2021-04-18 DIAGNOSIS — R748 Abnormal levels of other serum enzymes: Secondary | ICD-10-CM

## 2021-04-18 DIAGNOSIS — M79604 Pain in right leg: Secondary | ICD-10-CM | POA: Insufficient documentation

## 2021-04-18 DIAGNOSIS — M79642 Pain in left hand: Secondary | ICD-10-CM | POA: Insufficient documentation

## 2021-04-18 DIAGNOSIS — R252 Cramp and spasm: Secondary | ICD-10-CM

## 2021-04-18 DIAGNOSIS — M79605 Pain in left leg: Secondary | ICD-10-CM | POA: Insufficient documentation

## 2021-04-18 DIAGNOSIS — J45909 Unspecified asthma, uncomplicated: Secondary | ICD-10-CM | POA: Insufficient documentation

## 2021-04-18 LAB — CBC WITH DIFFERENTIAL/PLATELET
Abs Immature Granulocytes: 0.12 10*3/uL — ABNORMAL HIGH (ref 0.00–0.07)
Basophils Absolute: 0 10*3/uL (ref 0.0–0.1)
Basophils Relative: 0 %
Eosinophils Absolute: 0.1 10*3/uL (ref 0.0–0.5)
Eosinophils Relative: 2 %
HCT: 38.1 % — ABNORMAL LOW (ref 39.0–52.0)
Hemoglobin: 13 g/dL (ref 13.0–17.0)
Immature Granulocytes: 2 %
Lymphocytes Relative: 24 %
Lymphs Abs: 1.5 10*3/uL (ref 0.7–4.0)
MCH: 27.8 pg (ref 26.0–34.0)
MCHC: 34.1 g/dL (ref 30.0–36.0)
MCV: 81.4 fL (ref 80.0–100.0)
Monocytes Absolute: 0.6 10*3/uL (ref 0.1–1.0)
Monocytes Relative: 9 %
Neutro Abs: 4 10*3/uL (ref 1.7–7.7)
Neutrophils Relative %: 63 %
Platelets: 234 10*3/uL (ref 150–400)
RBC: 4.68 MIL/uL (ref 4.22–5.81)
RDW: 14.3 % (ref 11.5–15.5)
WBC: 6.3 10*3/uL (ref 4.0–10.5)
nRBC: 0 % (ref 0.0–0.2)

## 2021-04-18 LAB — BASIC METABOLIC PANEL
Anion gap: 9 (ref 5–15)
BUN: 17 mg/dL (ref 6–20)
CO2: 28 mmol/L (ref 22–32)
Calcium: 8.6 mg/dL — ABNORMAL LOW (ref 8.9–10.3)
Chloride: 98 mmol/L (ref 98–111)
Creatinine, Ser: 0.66 mg/dL (ref 0.61–1.24)
GFR, Estimated: >60 mL/min (ref >60)
Glucose, Bld: 282 mg/dL — ABNORMAL HIGH (ref 70–99)
Potassium: 3.5 mmol/L (ref 3.5–5.1)
Sodium: 135 mmol/L (ref 135–145)

## 2021-04-18 LAB — CK: Total CK: 575 U/L — ABNORMAL HIGH (ref 49–397)

## 2021-04-18 NOTE — ED Triage Notes (Signed)
Pt reports "whole body cramping up" x 2 weeks ?

## 2021-04-18 NOTE — ED Provider Notes (Signed)
Signout from Dr. Dina Rich.  51 year old male here with muscle cramps and spasm.  Plan is to follow-up on labs and likely discharge. ?Physical Exam  ?BP (!) 142/98 (BP Location: Right Arm)   Pulse 78   Temp 98.5 ?F (36.9 ?C) (Oral)   Resp 20   Ht '5\' 8"'$  (1.727 m)   Wt 111.1 kg   SpO2 95%   BMI 37.25 kg/m?  ? ?Physical Exam ? ?Procedures  ?Procedures ? ?ED Course / MDM  ?  ?Medical Decision Making ?Amount and/or Complexity of Data Reviewed ?Labs: ordered. ? ? ?Labs showing mild elevation of CPK.  Blood sugar also elevated.  I reviewed this with patient.  He is currently on steroids for possible sarcoidosis flare.  Recommended plan for stopping his cholesterol medication for possible cause of his myalgias.  Also recommended increasing fluids.  He has an appointment next week with his PCP.  He will need some repeat blood work at this time to sort out whether he is new onset NIDDM. ? ? ? ? ?  ?Hayden Rasmussen, MD ?04/18/21 [phone] ? ?

## 2021-04-18 NOTE — ED Provider Notes (Signed)
?[facility] ?Provider Note ? ? ?CSN: [id number] ?Arrival date & time: 04/18/21  0559 ? ?  ? ?History ? ?Chief Complaint  ?Patient presents with  ? cramps  ? ? ?Nathan [name] is a 51 y.o. male. ? ?HPI ? ?  ? ?This is a 51 year old male with a history of sarcoidosis, asthma, hyperlipidemia who presents with whole body cramping.  Patient states that he has had over 2-week history of cramping of the bilateral hands and bilateral legs.  He states it comes and goes randomly.  It is affecting his work.  He was seen and evaluated in the ED on 2/17 and had fairly normal work-up at that time.  He is on Crestor.  Denies any fevers or recent illnesses. ? ?Home Medications ?Prior to Admission medications   ?Medication Sig Start Date End Date Taking? Authorizing Provider  ?acetaminophen (TYLENOL) 500 MG tablet Take 1 tablet (500 mg total) by mouth every 6 (six) hours as needed. 04/01/21   Varney Biles, MD  ?albuterol (VENTOLIN HFA) 108 (90 Base) MCG/ACT inhaler Inhale 2 puffs into the lungs every 6 (six) hours as needed for wheezing or shortness of breath. 02/16/21   Martyn Ehrich, NP  ?aspirin EC 81 MG tablet Take 1 tablet (81 mg total) by mouth daily. Swallow whole. 02/28/21   Early Osmond, MD  ?betamethasone dipropionate 0.05 % cream Apply topically 2 (two) times daily. 11/10/20   Collene Gobble, MD  ?budesonide-formoterol (SYMBICORT) 160-4.5 MCG/ACT inhaler Inhale 2 puffs into the lungs 2 (two) times daily.    [provider]  ?cetirizine (ZYRTEC) 10 MG tablet Take 10 mg by mouth daily. 01/15/20   [provider]  ?Cholecalciferol (VITAMIN D-3) 125 MCG (5000 UT) TABS Take 1 tablet by mouth daily. 01/21/20   Hilts, Legrand Como, MD  ?cyclobenzaprine (FLEXERIL) 10 MG tablet Take 1 tablet (10 mg total) by mouth 3 (three) times daily as needed for muscle spasms. ?Patient not taking: Reported on 02/16/2021 04/09/20   Sherwood Gambler, MD  ?fluticasone [facility]) 50 MCG/ACT nasal spray  Place 1 spray into both nostrils 2 (two) times daily. 12/26/19   Fulp, Cammie, MD  ?ipratropium (ATROVENT) 0.06 % nasal spray Place 2 sprays into both nostrils 4 (four) times daily as needed for rhinitis. 12/26/19   Fulp, Cammie, MD  ?ipratropium-albuterol (DUONEB) 0.5-2.5 (3) MG/3ML SOLN     [provider]  ?meloxicam (MOBIC) 7.5 MG tablet Take 1 tablet (7.5 mg total) by mouth daily. ?Patient not taking: Reported on 02/16/2021 12/15/20   Carlisle Cater, PA-C  ?methocarbamol (ROBAXIN) 500 MG tablet Take 1 tablet (500 mg total) by mouth 2 (two) times daily. 04/01/21   Varney Biles, MD  ?pantoprazole (PROTONIX) 40 MG tablet Take 1 tablet (40 mg total) by mouth daily. 04/09/20   Sherwood Gambler, MD  ?predniSONE (DELTASONE) 20 MG tablet Take 1.5 tablets (30 mg total) by mouth daily with breakfast. 03/11/21   Collene Gobble, MD  ?pregabalin (LYRICA) 50 MG capsule 1 PO q HS, may increase to 1 PO BID if tolerated ?Patient not taking: Reported on 02/16/2021 02/25/20   Hilts, Legrand Como, MD  ?rosuvastatin (CRESTOR) 5 MG tablet Take 1 tablet (5 mg total) by mouth daily. To lower cholesterol 12/26/19   Antony Blackbird, MD  ?   ? ?Allergies    ?Percocet [oxycodone-acetaminophen] and Vicodin [hydrocodone-acetaminophen]   ? ?Review of Systems   ?Review of Systems  ?Constitutional:  Negative for fever.  ?Musculoskeletal:  Positive for myalgias.  ?  All other systems reviewed and are negative. ? ?Physical Exam ?Updated Vital Signs ?BP (!) 142/98 (BP Location: Right Arm)   Pulse 78   Temp 98.5 ?F (36.9 ?C) (Oral)   Resp 20   Ht 1.727 m ('5\' 8"'$ )   Wt 111.1 kg   SpO2 95%   BMI 37.25 kg/m?  ?Physical Exam ?Vitals and nursing note reviewed.  ?Constitutional:   ?   Appearance: He is well-developed. He is not ill-appearing.  ?HENT:  ?   Head: Normocephalic and atraumatic.  ?   Mouth/Throat:  ?   Mouth: Mucous membranes are moist.  ?Eyes:  ?   Pupils: Pupils are equal, round, and reactive to light.  ?Cardiovascular:  ?   Rate and  Rhythm: Normal rate and regular rhythm.  ?Pulmonary:  ?   Effort: Pulmonary effort is normal. No respiratory distress.  ?Abdominal:  ?   Palpations: Abdomen is soft.  ?   Tenderness: There is no abdominal tenderness.  ?Musculoskeletal:  ?   Cervical back: Neck supple.  ?   Right lower leg: No edema.  ?   Left lower leg: No edema.  ?Lymphadenopathy:  ?   Cervical: No cervical adenopathy.  ?Skin: ?   General: Skin is warm and dry.  ?Neurological:  ?   Mental Status: He is alert and oriented to person, place, and time.  ?Psychiatric:     ?   Mood and Affect: Mood normal.  ? ? ?ED Results / Procedures / Treatments   ?Labs ?(all labs ordered are listed, but only abnormal results are displayed) ?Labs Reviewed  ?CBC WITH DIFFERENTIAL/PLATELET - Abnormal; Notable for the following components:  ?    Result Value  ? HCT 38.1 (*)   ? Abs Immature Granulocytes 0.12 (*)   ? All other components within normal limits  ?BASIC METABOLIC PANEL  ?CK  ? ? ?EKG ?None ? ?Radiology ?No results found. ? ?Procedures ?Procedures  ? ? ?Medications Ordered in ED ?Medications - No data to display ? ?ED Course/ Medical Decision Making/ A&P ?  ?                        ?Medical Decision Making ?Amount and/or Complexity of Data Reviewed ?Labs: ordered. ? ? ?This patient presents to the ED for concern of muscle cramping, this involves an extensive number of treatment options, and is a complaint that carries with it a high risk of complications and morbidity.  The differential diagnosis includes rhabdomyolysis, myalgias related to Crestor, metabolic derangements ? ?MDM:   ? ?This 51 year old male who presents with body cramping.  Was seen and evaluated in mid February for similar symptoms.  CK and metabolites pending.  He is nontoxic and vital signs are reassuring.  His exam is very nonspecific and benign.  Repeat labs were obtained to ensure stabilization.  I did note that he takes Crestor.  States that he has been taking this for some time.   Recommend that he discuss with his primary physician discontinuing this to see if this may be the culprit.  Patient stated understanding. ?(Labs, imaging) ? ?Labs: ?I Ordered, and personally interpreted labs.  The pertinent results include: Normal CBC, CK/,metabolites pending ?Imaging Studies ordered: ?I ordered imaging studies including none ?I independently visualized and interpreted imaging. ?I agree with the radiologist interpretation ? ?Additional history obtained from chart review.  External records from outside source obtained and reviewed including prior visits with similar complaint ? ?Critical Interventions: ?  None ? ?Consultations: ?I requested consultation with the none,  and discussed lab and imaging findings as well as pertinent plan - they recommend: None ? ?Cardiac Monitoring: ?The patient was maintained on a cardiac monitor.  I personally viewed and interpreted the cardiac monitored which showed an underlying rhythm of: Normal sinus rhythm ? ?Reevaluation: ?After the interventions noted above, I reevaluated the patient and found that they have :stayed the same ? ? ?Considered admission for: N/A ? ?Social Determinants of Health: ?Lives independently ? ?Disposition: Likely Discharge ? ?Co morbidities that complicate the patient evaluation ? ?Past Medical History:  ?Diagnosis Date  ? Arthritis   ? Asthma   ? 'I think I might have asthma"  ? Chronic back pain   ? Dyspnea   ? chronic  ? Hyperlipidemia   ? Sarcoidosis   ?  ? ?Medicines ?No orders of the defined types were placed in this encounter. ?  ?I have reviewed the patients home medicines and have made adjustments as needed ? ?Problem List / ED Course: ?Problem List Items Addressed This Visit   ?None ?Visit Diagnoses   ? ? Muscle cramping    -  Primary  ? ?  ?  ? ? ? ? ? ? ? ? ? ? ? ? ?Final Clinical Impression(s) / ED Diagnoses ?Final diagnoses:  ?Muscle cramping  ? ? ?Rx / DC Orders ?ED Discharge Orders   ? ? None  ? ?  ? ? ?  ?Merryl Hacker,  MD ?04/18/21 2359 ? ?

## 2021-04-18 NOTE — Discharge Instructions (Addendum)
You were seen today for whole body cramping.  Your work-up is reassuring.  Consider discussing with your primary care physician discontinuing Crestor as sometimes this can cause nonspecific muscle aches and cramping.  Make sure that you are staying well-hydrated. ?

## 2021-05-23 ENCOUNTER — Encounter: Payer: Self-pay | Attending: Physical Medicine and Rehabilitation | Admitting: Physical Medicine and Rehabilitation

## 2021-07-12 ENCOUNTER — Telehealth: Payer: Self-pay | Admitting: Primary Care

## 2021-07-12 NOTE — Telephone Encounter (Signed)
pt states his sarcoid  has flared up, & is asking for prednisone to be sent into his pharmacy. pt states '10mg'$  or '20mg'$  is sufficient , but nothing more than that, he states last time this was prescribed , too much was prescribed it caused his sugar to go up   Pharmacy: Nurse, mental health at [facility]

## 2021-07-12 NOTE — Telephone Encounter (Signed)
Spoke with the pt  He states having sarcoid flare- skin around his mouth and nose are dry and bumpy  He states that he wants pred called in, but fears that this will make his blood sugars go up  He is overdue for ov here  I scheduled him appt with [facility] for tomorrow  Nothing further needed

## 2021-07-13 ENCOUNTER — Encounter: Payer: Self-pay | Admitting: Primary Care

## 2021-07-13 ENCOUNTER — Encounter: Payer: Self-pay | Admitting: *Deleted

## 2021-07-13 ENCOUNTER — Ambulatory Visit (INDEPENDENT_AMBULATORY_CARE_PROVIDER_SITE_OTHER): Payer: Self-pay | Admitting: Primary Care

## 2021-07-13 VITALS — BP 118/70 | HR 80 | Temp 98.6°F | Ht 67.0 in | Wt 237.6 lb

## 2021-07-13 DIAGNOSIS — J4520 Mild intermittent asthma, uncomplicated: Secondary | ICD-10-CM

## 2021-07-13 DIAGNOSIS — D869 Sarcoidosis, unspecified: Secondary | ICD-10-CM

## 2021-07-13 LAB — CBC WITH DIFFERENTIAL/PLATELET
Basophils Absolute: 0 10*3/uL (ref 0.0–0.1)
Basophils Relative: 0.3 % (ref 0.0–3.0)
Eosinophils Absolute: 0.2 10*3/uL (ref 0.0–0.7)
Eosinophils Relative: 5.2 % — ABNORMAL HIGH (ref 0.0–5.0)
HCT: 40.1 % (ref 39.0–52.0)
Hemoglobin: 13.1 g/dL (ref 13.0–17.0)
Lymphocytes Relative: 27.6 % (ref 12.0–46.0)
Lymphs Abs: 1.1 10*3/uL (ref 0.7–4.0)
MCHC: 32.8 g/dL (ref 30.0–36.0)
MCV: 85.9 fl (ref 78.0–100.0)
Monocytes Absolute: 0.4 10*3/uL (ref 0.1–1.0)
Monocytes Relative: 11.6 % (ref 3.0–12.0)
Neutro Abs: 2.1 10*3/uL (ref 1.4–7.7)
Neutrophils Relative %: 55.3 % (ref 43.0–77.0)
Platelets: 285 10*3/uL (ref 150.0–400.0)
RBC: 4.67 Mil/uL (ref 4.22–5.81)
RDW: 13.4 % (ref 11.5–15.5)
WBC: 3.8 10*3/uL — ABNORMAL LOW (ref 4.0–10.5)

## 2021-07-13 MED ORDER — PREDNISONE 20 MG PO TABS: ORAL_TABLET | ORAL | 0 refills | Status: DC

## 2021-07-13 NOTE — Patient Instructions (Addendum)
Recommendations: Prednisone as directed Continue metformin Start Xyzal  Resume Symbicort- please contact company   Orders: Labs today  Follow-up: 1-3 months with Dr. Lamonte Sakai     Sarcoidosis  Sarcoidosis is a disease that can cause inflammation in many areas of the body. It most often affects the lungs (pulmonary sarcoidosis). Sarcoidosis can also affect the lymph nodes, liver, eyes, skin, heart, or any other body tissue. Normally, cells that are part of the body's disease-fighting system (immune system) attack harmful substances in the body, such as germs. This immune system response causes inflammation. After the harmful substance is destroyed, the inflammation goes away. When you have sarcoidosis, your immune system causes inflammation even when there are no harmful substances, and the inflammation does not go away. Sarcoidosis also causes cells from your immune system to form small lumps (granulomas) in the affected area of your body. What are the causes? The exact cause of sarcoidosis is not known.  If you have a family history of this disease (genetic predisposition), the immune system response that leads to inflammation may be triggered by something in your environment, such as: Bacteria or viruses. Metals. Chemicals. Dust. Mold or mildew. What increases the risk? You may be more likely to develop this condition if you: Have a family history of the disease. Are African American. Are of Northern European descent. Are 39-75 years old. Are male. Work as a Airline pilot. Work in an environment where you are exposed to metals, chemicals, mold or mildew, or insecticides. What are the signs or symptoms? Some people with sarcoidosis have no symptoms. Others have very mild symptoms. The symptoms usually depend on the organ that is affected. Sarcoidosis most often affects the lungs, which may lead to symptoms such as: Chest pain. Coughing. Wheezing. Shortness of breath. Other  common symptoms include: Night sweats. Fever. Weight loss. Tiredness (fatigue). Swollen lymph nodes. Joint pain. How is this diagnosed? This condition may be diagnosed based on: Your symptoms and medical history. A physical exam. Imaging tests such as: Chest X-ray. CT scan. MRI. PET scan. Lung function tests. These tests evaluate your breathing and check for problems that may be related to sarcoidosis. A procedure to remove a tissue sample for testing (biopsy). You may have a biopsy of lung tissue if that is where you are having symptoms. You may have tests to check for any complications of the condition. These tests may include: Eye exams. MRI of the heart or brain. Echocardiogram. ECG (electrocardiogram). How is this treated? In some cases, sarcoidosis does not require a specific treatment because it causes no symptoms or only mild symptoms. If your symptoms bother you or are severe, you may be prescribed medicines to reduce inflammation or relieve symptoms. These medicines may include: Prednisone. This is a steroid that reduces inflammation related to sarcoidosis. Hydroxychloroquine. This may be used to treat sarcoidosis that affects the skin, eyes, or brain. Certain medicines that affect the immune system. These can help with sarcoidosis in the joints, eyes, skin, or lungs. Medicines that you breathe in (inhalers). Inhalers can help you breathe if sarcoidosis affects your lungs. Follow these instructions at home:  Do not use any products that contain nicotine or tobacco. These products include cigarettes, chewing tobacco, and vaping devices, such as e-cigarettes. If you need help quitting, ask your health care provider. Avoid secondhand smoke and irritating dust or chemicals. Stay indoors on days when air quality is poor in your area. Return to your normal activities as told by your health care provider.  Ask your health care provider what activities are safe for you. Take or use  over-the-counter and prescription medicines only as told by your health care provider. Keep all follow-up visits. This is important. Where to find more information National Heart, Lung, and [employer] Institute: https://wilson-eaton.com/ Contact a health care provider if: You have vision problems. You have a dry cough that does not go away. You have an irregular heartbeat. You have pain or aches in your joints, hands, or feet. You have an unexplained rash. Get help right away if: You have chest pain. You have trouble breathing. These symptoms may represent a serious problem that is an emergency. Do not wait to see if the symptoms will go away. Get medical help right away. Call your local emergency services (911 in the [place] Do not drive yourself to the hospital. Summary Sarcoidosis is a disease that can cause inflammation in many body areas of the body. It most often affects the lungs (pulmonary sarcoidosis). It can also affect the lymph nodes, liver, eyes, skin, heart, or any other body tissue. When you have sarcoidosis, cells from your immune system form small lumps (granulomas) in the affected area of your body. Sarcoidosis sometimes does not require a specific treatment because it causes no symptoms or only mild symptoms. If your symptoms bother you or are severe, you may be prescribed medicines to reduce inflammation or relieve symptoms. This information is not intended to replace advice given to you by your health care provider. Make sure you discuss any questions you have with your health care provider. Document Revised: 12/02/2019 Document Reviewed: 12/02/2019 Elsevier Patient Education  [place].

## 2021-07-13 NOTE — Progress Notes (Signed)
_0  ID: Nathan [name], male    DOB: 1970/07/27, 51 y.o.   MRN: [id number]  Chief Complaint  Patient presents with   Acute Visit    C/o sarcoid flare x 1 mth., itching around eyes and [name], sob occass.    Referring provider: [facility], [facility]  HPI: 51 year old male, former smoker quit in 2012 (20-pack-year history).  Past medical history significant for asthma, sarcoidosis, COVID-19 infection.  Patient of Dr. Lamonte Sakai.  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  Patient had CT chest on 11/29/2020 that showed stable pulmonary mediastinal manifestations of sarcoid including substantial adenopathy along with scattered non-progressive scarring and nodularity. He experiences dyspnea symptoms and chest tightness with moderate-heavy exertion. He has an occasional productive cough with purulent mucus and chest discomfort. No active chest pain today in office. Currently dealing with flare up of his skin lesions/rash. He has been using steriod cream without significant improvement. He has chronic back d/t spondylosis. He does Biomedical scientist for work. He is compliant with Symbicort 158mg two puffs twice daily. He uses albuterol hfa 1-2 times a week.   07/13/2021- Interim hx  Patient presents today for acute OV. He reports symptoms of itching around his eyes and [name].  He has a rare cough  and occasional chest tightness. He cuts grass at work. Not currently taking daily antihistamine or Symbicort. He was receiving patient assistance for maintenance inhaler and needs to renew through his PCP. Denies eye pain, vision changes, active chest pain or shortness of breath.   Allergies  Allergen Reactions   Percocet [Oxycodone-Acetaminophen] Itching    Able to take tylenol   Vicodin [Hydrocodone-Acetaminophen] Itching    Immunization History  Administered Date(s) Administered   Influenza,inj,Quad PF,6+ Mos 11/10/2020   PFIZER(Purple Top)SARS-COV-2 Vaccination 10/27/2020    Past Medical History:  Diagnosis Date   Arthritis    Asthma    'I think I might have asthma"   Chronic back pain    Dyspnea    chronic   Hyperlipidemia    Sarcoidosis     Tobacco History: Social History   Tobacco Use  Smoking Status Former   Packs/day: 1.00   Years: 20.00   Pack years: 20.00   Types: Cigarettes   Quit date: 01/13/2011   Years since quitting: 10.5  Smokeless Tobacco Never   Counseling given: Not Answered   Outpatient Medications Prior to Visit  Medication Sig Dispense Refill   albuterol (VENTOLIN HFA) 108 (90 Base) MCG/ACT inhaler Inhale 2 puffs into the lungs every 6 (six) hours as needed for wheezing or shortness of breath. 18 g 5   aspirin EC 81 MG tablet Take 1 tablet (81 mg total) by mouth daily. Swallow whole. 90 tablet  3   betamethasone dipropionate 0.05 % cream Apply topically 2 (two) times daily. 30 g 0   cetirizine (ZYRTEC) 10 MG tablet Take 10 mg by mouth daily.     fluticasone (FLONASE) 50 MCG/ACT nasal spray Place 1 spray into both nostrils 2 (two) times daily. 16 g 5   ipratropium (ATROVENT) 0.06 % nasal spray Place 2 sprays into both nostrils 4 (four) times daily as needed for rhinitis. 15 mL 5   ipratropium-albuterol (DUONEB) 0.5-2.5 (3) MG/3ML SOLN      pantoprazole (PROTONIX) 40 MG tablet Take 1 tablet (40 mg total) by mouth daily. 30 tablet 0    acetaminophen (TYLENOL) 500 MG tablet Take 1 tablet (500 mg total) by mouth every 6 (six) hours as needed. (Patient not taking: Reported on 07/13/2021) 30 tablet 0   budesonide-formoterol (SYMBICORT) 160-4.5 MCG/ACT inhaler Inhale 2 puffs into the lungs 2 (two) times daily. (Patient not taking: Reported on 07/13/2021)     celecoxib (CELEBREX) 100 MG capsule Take 100 mg by mouth 2 (two) times daily. (Patient not taking: Reported on 07/13/2021)     Cholecalciferol (VITAMIN D-3) 125 MCG (5000 UT) TABS Take 1 tablet by mouth daily. (Patient not taking: Reported on 07/13/2021) 90 tablet 3   cyclobenzaprine (FLEXERIL) 10 MG tablet Take 1 tablet (10 mg total) by mouth 3 (three) times daily as needed for muscle spasms. (Patient not taking: Reported on 02/16/2021) 15 tablet 0   meloxicam (MOBIC) 7.5 MG tablet Take 1 tablet (7.5 mg total) by mouth daily. (Patient not taking: Reported on 02/16/2021) 10 tablet 0   methocarbamol (ROBAXIN) 500 MG tablet Take 1 tablet (500 mg total) by mouth 2 (two) times daily. (Patient not taking: Reported on 07/13/2021) 20 tablet 0   pregabalin (LYRICA) 50 MG capsule 1 PO q HS, may increase to 1 PO BID if tolerated (Patient not taking: Reported on 02/16/2021) 60 capsule 5   rosuvastatin (CRESTOR) 5 MG tablet Take 1 tablet (5 mg total) by mouth daily. To lower cholesterol (Patient not taking: Reported on 07/13/2021) 30 tablet 5   predniSONE (DELTASONE) 20 MG tablet Take 1.5 tablets (30 mg total) by mouth daily with breakfast. (Patient not taking: Reported on 07/13/2021) 45 tablet 0   No facility-administered medications prior to visit.    Review of Systems  Review of Systems  Constitutional: Negative.   HENT: Negative.    Eyes:  Positive for itching. Negative for photophobia, pain, discharge, redness and visual disturbance.  Respiratory:  Positive for cough and shortness of breath. Negative for chest tightness and wheezing.   Cardiovascular:  Negative for chest pain.  Skin:  Positive  for rash.  Neurological: Negative.     Physical Exam  BP 118/70 (BP Location: Right Arm, Cuff Size: Large)   Pulse 80   Temp 98.6 F (37 C) (Temporal)   Ht _0  (1.702 m)   Wt 237 lb 9.6 oz (107.8 kg)   SpO2 99%   BMI 37.21 kg/m  Physical Exam Constitutional:      Appearance: Normal appearance.  HENT:     Head: Normocephalic and atraumatic.  Cardiovascular:     Rate and Rhythm: Normal rate and regular rhythm.  Pulmonary:     Effort: Pulmonary effort is normal.     Breath sounds: Normal breath sounds. No wheezing.  Musculoskeletal:        General: Normal range of motion.     Cervical back: Normal range of motion and neck supple.  Skin:  General: Skin is warm and dry.     Comments: Two areas of dry skin/rash to inner right eye and right nostril, approx 5-56m  Neurological:     General: No focal deficit present.     Mental Status: He is alert and oriented to person, place, and time. Mental status is at baseline.  Psychiatric:        Mood and Affect: Mood normal.        Behavior: Behavior normal.        Thought Content: Thought content normal.        Judgment: Judgment normal.     Lab Results:  CBC    Component Value Date/Time   WBC 3.8 (L) 07/13/2021 1136   RBC 4.67 07/13/2021 1136   HGB 13.1 07/13/2021 1136   HCT 40.1 07/13/2021 1136   PLT 285.0 07/13/2021 1136   MCV 85.9 07/13/2021 1136   MCH 27.8 04/18/2021 0624   MCHC 32.8 07/13/2021 1136   RDW 13.4 07/13/2021 1136   LYMPHSABS 1.1 07/13/2021 1136   MONOABS 0.4 07/13/2021 1136   EOSABS 0.2 07/13/2021 1136   BASOSABS 0.0 07/13/2021 1136    BMET    Component Value Date/Time   NA 135 04/18/2021 0624   K 3.5 04/18/2021 0624   CL 98 04/18/2021 0624   CO2 28 04/18/2021 0624   GLUCOSE 282 (H) 04/18/2021 0624   BUN 17 04/18/2021 0624   CREATININE 0.66 04/18/2021 0624   CALCIUM 8.6 (L) 04/18/2021 0624   GFRNONAA >60 04/18/2021 0624   GFRAA >60 09/17/2019 1502    BNP    Component Value Date/Time    BNP 18.2 04/10/2014 0811    ProBNP No results found for: PROBNP  Imaging: No results found.   Assessment & Plan:   Sarcoidosis - Possible sarcoid flare +/- allergic dermatitis. Sarcoid in the past as presented as skin lesions. Skin Bx R nasal 03/14/17 >> Pos  Granulomatous dermatitis. ACE level in February 2019 was 33. He developed pruritic rash right eye and right nostril after cutting grass outdoors. Symptoms have been present for 1 month. Checking ACE level and CBC with diff. Recommend starting Xyzal. Sending in RX prednisone 265mx 2 weeks. FU if symptoms do not improve or worsen.  Asthma - Advised he resume Symbicort 16038mwo puffs twice daily, applying for patient assistance through PCP office   EliMartyn EhrichP 07/19/2021

## 2021-07-17 LAB — ANGIOTENSIN CONVERTING ENZYME: Angiotensin-Converting Enzyme: 17 U/L (ref 9–67)

## 2021-07-19 NOTE — Progress Notes (Signed)
Ace level was normal. I do not suspect active sarcoid flare, likely related to allergies. Make sure he is taking OTC antihistamine. Finish pred as directed. Notify if ot better

## 2021-07-19 NOTE — Assessment & Plan Note (Signed)
-   Advised he resume Symbicort '160mg'$  two puffs twice daily, applying for patient assistance through PCP office

## 2021-07-19 NOTE — Assessment & Plan Note (Addendum)
-   Possible sarcoid flare +/- allergic dermatitis. Sarcoid in the past as presented as skin lesions. Skin Bx R nasal 03/14/17 >> Pos  Granulomatous dermatitis. ACE level in February 2019 was 33. He developed pruritic rash right eye and right nostril after cutting grass outdoors. Symptoms have been present for 1 month. Checking ACE level and CBC with diff. Recommend starting Xyzal. Sending in RX prednisone '20mg'$  x 2 weeks. FU if symptoms do not improve or worsen.

## 2021-08-11 ENCOUNTER — Emergency Department (HOSPITAL_BASED_OUTPATIENT_CLINIC_OR_DEPARTMENT_OTHER)
Admission: EM | Admit: 2021-08-11 | Discharge: 2021-08-11 | Disposition: A | Discharge status: Home / Self Care | Payer: Self-pay | Attending: Emergency Medicine | Admitting: Emergency Medicine

## 2021-08-11 ENCOUNTER — Other Ambulatory Visit (HOSPITAL_BASED_OUTPATIENT_CLINIC_OR_DEPARTMENT_OTHER): Payer: Self-pay

## 2021-08-11 ENCOUNTER — Other Ambulatory Visit: Payer: Self-pay

## 2021-08-11 ENCOUNTER — Encounter (HOSPITAL_BASED_OUTPATIENT_CLINIC_OR_DEPARTMENT_OTHER): Payer: Self-pay | Admitting: Emergency Medicine

## 2021-08-11 DIAGNOSIS — M25561 Pain in right knee: Secondary | ICD-10-CM | POA: Insufficient documentation

## 2021-08-11 DIAGNOSIS — Z7982 Long term (current) use of aspirin: Secondary | ICD-10-CM | POA: Insufficient documentation

## 2021-08-11 DIAGNOSIS — M545 Low back pain, unspecified: Secondary | ICD-10-CM | POA: Insufficient documentation

## 2021-08-11 DIAGNOSIS — M25562 Pain in left knee: Secondary | ICD-10-CM | POA: Insufficient documentation

## 2021-08-11 MED ORDER — METHYLPREDNISOLONE 4 MG PO TBPK
ORAL_TABLET | ORAL | 0 refills | Status: DC
  Filled 2021-08-11: qty 21, 6d supply, fill #0

## 2021-08-11 MED ORDER — CYCLOBENZAPRINE HCL 10 MG PO TABS
10.0000 mg | ORAL_TABLET | Freq: Two times a day (BID) | ORAL | 0 refills | Status: DC | PRN
  Filled 2021-08-11: qty 20, 10d supply, fill #0

## 2021-08-11 NOTE — Discharge Instructions (Signed)
Recommend taking 600 mg ibuprofen every 8 hours as needed for pain.  Recommend taking 1000 mg of Tylenol every 6 hours as needed for pain.  Take Flexeril as needed for muscle spasm.  This medication is sedating so please be careful with its use.  I have also prescribed you steroids to help with this discomfort.

## 2021-08-11 NOTE — ED Triage Notes (Signed)
Bilateral knee pain andback pain for a couple weeks.

## 2021-08-11 NOTE — ED Provider Notes (Signed)
[facility] DEPT Provider Note   CSN: [id number] Arrival date & time: 08/11/21  1159     History  Chief Complaint  Patient presents with   Leg Pain    Nathan [name] is a 51 y.o. male.  Patient here with back pain, bilateral knee pain.  Works a Retail buyer job.  Feeling pain and tightness in this area for the last several weeks on and off.  Nothing makes it worse or better.  Denies any fevers or chills.  Denies any chest pain or shortness of breath.  No joint swelling.  No weakness or numbness.  No loss of bowel or bladder.  Denies any specific trauma.  The history is provided by the patient.       Home Medications Prior to Admission medications   Medication Sig Start Date End Date Taking? Authorizing Provider  cyclobenzaprine (FLEXERIL) 10 MG tablet Take 1 tablet (10 mg total) by mouth 2 (two) times daily as needed for muscle spasms. 08/11/21  Yes Emilyn Ruble, DO  methylPREDNISolone (MEDROL DOSEPAK) 4 MG TBPK tablet Follow package insert 08/11/21  Yes Myleigh Amara, DO  acetaminophen (TYLENOL) 500 MG tablet Take 1 tablet (500 mg total) by mouth every 6 (six) hours as needed. Patient not taking: Reported on 07/13/2021 04/01/21   Varney Biles, MD  albuterol (VENTOLIN HFA) 108 (90 Base) MCG/ACT inhaler Inhale 2 puffs into the lungs every 6 (six) hours as needed for wheezing or shortness of breath. 02/16/21   Martyn Ehrich, NP  aspirin EC 81 MG tablet Take 1 tablet (81 mg total) by mouth daily. Swallow whole. 02/28/21   Early Osmond, MD  betamethasone dipropionate 0.05 % cream Apply topically 2 (two) times daily. 11/10/20   Collene Gobble, MD  budesonide-formoterol (SYMBICORT) 160-4.5 MCG/ACT inhaler Inhale 2 puffs into the lungs 2 (two) times daily. Patient not taking: Reported on 07/13/2021    [provider]  celecoxib (CELEBREX) 100 MG capsule Take 100 mg by mouth 2 (two) times daily. Patient not taking: Reported on 07/13/2021 03/18/21    [provider]  cetirizine (ZYRTEC) 10 MG tablet Take 10 mg by mouth daily. 01/15/20   [provider]  Cholecalciferol (VITAMIN D-3) 125 MCG (5000 UT) TABS Take 1 tablet by mouth daily. Patient not taking: Reported on 07/13/2021 01/21/20   Hilts, Legrand Como, MD  fluticasone [facility]) 50 MCG/ACT nasal spray Place 1 spray into both nostrils 2 (two) times daily. 12/26/19   Fulp, Cammie, MD  ipratropium (ATROVENT) 0.06 % nasal spray Place 2 sprays into both nostrils 4 (four) times daily as needed for rhinitis. 12/26/19   Fulp, Cammie, MD  ipratropium-albuterol (DUONEB) 0.5-2.5 (3) MG/3ML SOLN     [provider]  meloxicam (MOBIC) 7.5 MG tablet Take 1 tablet (7.5 mg total) by mouth daily. Patient not taking: Reported on 02/16/2021 12/15/20   Carlisle Cater, PA-C  methocarbamol (ROBAXIN) 500 MG tablet Take 1 tablet (500 mg total) by mouth 2 (two) times daily. Patient not taking: Reported on 07/13/2021 04/01/21   Varney Biles, MD  pantoprazole (PROTONIX) 40 MG tablet Take 1 tablet (40 mg total) by mouth daily. 04/09/20   Sherwood Gambler, MD  predniSONE (DELTASONE) 20 MG tablet Take '20mg'$  daily x 2 weeks 07/13/21   Martyn Ehrich, NP  pregabalin (LYRICA) 50 MG capsule 1 PO q HS, may increase to 1 PO BID if tolerated Patient not taking: Reported on 02/16/2021 02/25/20   Hilts, Legrand Como, MD  rosuvastatin (CRESTOR) 5  MG tablet Take 1 tablet (5 mg total) by mouth daily. To lower cholesterol Patient not taking: Reported on 07/13/2021 12/26/19   Antony Blackbird, MD      Allergies    Percocet [oxycodone-acetaminophen] and Vicodin [hydrocodone-acetaminophen]    Review of Systems   Review of Systems  Physical Exam Updated Vital Signs BP 128/89 (BP Location: Right Arm)   Pulse 65   Temp 98.4 F (36.9 C)   Resp 18   SpO2 98%  Physical Exam Vitals and nursing note reviewed.  Constitutional:      General: He is not in acute distress.    Appearance: He is well-developed. He is not  ill-appearing.  HENT:     Head: Normocephalic and atraumatic.  Eyes:     Extraocular Movements: Extraocular movements intact.     Conjunctiva/sclera: Conjunctivae normal.     Pupils: Pupils are equal, round, and reactive to light.  Cardiovascular:     Rate and Rhythm: Normal rate and regular rhythm.     Pulses: Normal pulses.     Heart sounds: Normal heart sounds. No murmur heard. Pulmonary:     Effort: Pulmonary effort is normal. No respiratory distress.     Breath sounds: Normal breath sounds.  Abdominal:     Palpations: Abdomen is soft.     Tenderness: There is no abdominal tenderness.  Musculoskeletal:        General: No swelling, tenderness or deformity. Normal range of motion.     Cervical back: Normal range of motion and neck supple.     Comments: No midline spinal pain, tenderness to the paraspinal muscles bilaterally in the lumbar spine  Skin:    General: Skin is warm and dry.     Capillary Refill: Capillary refill takes less than 2 seconds.  Neurological:     General: No focal deficit present.     Mental Status: He is alert and oriented to person, place, and time.     Cranial Nerves: No cranial nerve deficit.     Sensory: No sensory deficit.     Motor: No weakness.     Coordination: Coordination normal.     Comments: 5+ out of 5 strength throughout, normal sensation, no drift, normal finger-[name]-finger  Psychiatric:        Mood and Affect: Mood normal.     ED Results / Procedures / Treatments   Labs (all labs ordered are listed, but only abnormal results are displayed) Labs Reviewed - No data to display  EKG None  Radiology No results found.  Procedures Procedures    Medications Ordered in ED Medications - No data to display  ED Course/ Medical Decision Making/ A&P                           Medical Decision Making Risk Prescription drug management.   Nathan [name] is here with low back pain, bilateral knee pain.  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  Denies any weakness or numbness.  Denies any trauma.  Denies any fevers or chills.  Differential diagnosis likely muscle spasm, arthritis.  I have no concern for cauda equina or other acute process at this time.  No concern for fracture.  Will prescribe Flexeril and Medrol Dosepak.  Recommend Tylenol and ibuprofen and rest.  Discharged in good condition.  Neurovascular neuromuscular intact.  Understands return precautions.  This chart was dictated using voice recognition software.  Despite  best efforts to proofread,  errors can occur which can change the documentation meaning.         Final Clinical Impression(s) / ED Diagnoses Final diagnoses:  Acute low back pain without sciatica, unspecified back pain laterality    Rx / DC Orders ED Discharge Orders          Ordered    methylPREDNISolone (MEDROL DOSEPAK) 4 MG TBPK tablet        08/11/21 1405    cyclobenzaprine (FLEXERIL) 10 MG tablet  2 times daily PRN        08/11/21 1405              Mozes Sagar, DO 08/11/21 1408

## 2021-08-22 NOTE — Progress Notes (Unsigned)
Cardiology Office Note:    Date:  08/25/2021   ID:  Nathan [name], DOB 08-18-1970, MRN [id number]  PCP:  [facility]   [facility] Providers Cardiologist:  Lenna Sciara, MD Referring MD: [facility]   Chief Complaint/Reason for Referral: Chest tightness and dyspnea  ASSESSMENT:    Precordial pain - Plan: CT CORONARY MORPH W/CTA COR W/SCORE W/CA W/CM &/OR WO/CM  Sarcoidosis - Plan: MR CARDIAC MORPHOLOGY WO CONTRAST  Aortic atherosclerosis ([place])  Hyperlipidemia, unspecified hyperlipidemia type - Plan: Lipid panel, Lipoprotein A (LPA)    PLAN:    In order of problems listed above:  1.  Chest pain  We will obtain a coronary CTA and echocardiogram to evaluate further.  If the patient has mild obstructive coronary artery disease, they will require a statin (with goal LDL < 70) and aspirin, if they have high-grade disease we will need to consider optimal medical therapy and if symptoms are refractory to medical therapy, then a cardiac catheterization with possible PCI will be pursued to alleviate symptoms.  If they have high risk disease we will proceed directly to cardiac catheterization.  Follow up 6 months. 2.  Sarcoidosis: We will obtain cardiac MRI to evaluate for cardiac involvement.  He has had no EKG abnormalities or rhythm disturbances suggestive of active cardiac sarcoid.  Additionally scar burden will help prognosticate if he does carry a diagnosis of sarcoid. 3.  Aortic atherosclerosis: Continue aspirin, Zetia, and strict blood pressure control 4.  Hyperlipidemia: Continue Zetia.  We will check lipid panel and LP(a).  If his LDL is not less than 70 I will refer him to pharmacy for further recommendations.    Dispo:  Return in about 6 months (around 02/25/2022).     Medication Adjustments/Labs and Tests Ordered: Current medicines are reviewed at length with the patient today.  Concerns regarding medicines are outlined above.    Tests Ordered: Orders Placed This Encounter  Procedures   CT CORONARY MORPH W/CTA COR W/SCORE W/CA W/CM &/OR WO/CM   MR CARDIAC MORPHOLOGY WO CONTRAST   Lipid panel   Lipoprotein A (LPA)    Medication Changes: Meds ordered this encounter  Medications   metoprolol tartrate (LOPRESSOR) 100 MG tablet    Sig: Take 1 tablet two hours before ct    Dispense:  1 tablet    Refill:  0    History of Present Illness:    FOCUSED CARDIOVASCULAR PROBLEM LIST:   1.  Pulmonary sarcoidosis 2.  Asthma and emphysema 3.  Hyperlipidemia; intolerant of atorvastatin 4.  Aortic atherosclerosis on CT 2022  January 2023: Patient was seen for initial consultation regarding chest pain.  He is referred for coronary CTA and echocardiogram.  He was started on aspirin 81 mg and his Crestor was continued due to the presence of aortic atherosclerosis on a previous CT scan.   Today: In the interim the patient had an echocardiogram which was reassuring.  His coronary CTA was not performed.  He presented in February and in March with myalgias of his lower extremities.  The patient still gets occasional fleeting chest pains.  He is not sure if this relates to his back pain that he has been dealing with.  He denies any shortness of breath, palpitations, presyncope, or syncope.  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  He is required no emergency room visits or hospitalizations.  Because of his intolerance of atorvastatin he was started on Zetia  about a month and a half ago.            Current Medications: Current Meds  Medication Sig   acetaminophen (TYLENOL) 500 MG tablet Take 1 tablet (500 mg total) by mouth every 6 (six) hours as needed.   albuterol (VENTOLIN HFA) 108 (90 Base) MCG/ACT inhaler Inhale 2 puffs into the lungs every 6 (six) hours as needed for wheezing or shortness of breath.   aspirin EC 81 MG tablet Take 1 tablet (81 mg total) by mouth daily. Swallow whole.    betamethasone dipropionate 0.05 % cream Apply topically 2 (two) times daily.   budesonide-formoterol (SYMBICORT) 160-4.5 MCG/ACT inhaler Inhale 2 puffs into the lungs 2 (two) times daily.   celecoxib (CELEBREX) 100 MG capsule Take 100 mg by mouth 2 (two) times daily.   cetirizine (ZYRTEC) 10 MG tablet Take 10 mg by mouth daily.   ezetimibe (ZETIA) 10 MG tablet Take 10 mg by mouth daily.   fluticasone (FLONASE) 50 MCG/ACT nasal spray Place 1 spray into both nostrils 2 (two) times daily.   gabapentin (NEURONTIN) 100 MG capsule Take 100 mg by mouth 3 (three) times daily.   ipratropium (ATROVENT) 0.06 % nasal spray Place 2 sprays into both nostrils 4 (four) times daily as needed for rhinitis.   ipratropium-albuterol (DUONEB) 0.5-2.5 (3) MG/3ML SOLN    metFORMIN (GLUCOPHAGE-XR) 500 MG 24 hr tablet Take 1,000 mg by mouth 2 (two) times daily.   metoprolol tartrate (LOPRESSOR) 100 MG tablet Take 1 tablet two hours before ct   pantoprazole (PROTONIX) 40 MG tablet Take 1 tablet (40 mg total) by mouth daily.   [DISCONTINUED] predniSONE (DELTASONE) 20 MG tablet Take '20mg'$  daily x 2 weeks     Allergies:    Percocet [oxycodone-acetaminophen] and Vicodin [hydrocodone-acetaminophen]   Social History:   Social History   Tobacco Use   Smoking status: Former    Packs/day: 1.00    Years: 20.00    Total pack years: 20.00    Types: Cigarettes    Quit date: 01/13/2011    Years since quitting: 10.6   Smokeless tobacco: Never  Vaping Use   Vaping Use: Never used  Substance Use Topics   Alcohol use: No    Alcohol/week: 0.0 standard drinks of alcohol   Drug use: No     Family Hx: Family History  Problem Relation Age of Onset   Heart disease Maternal Grandfather    Heart disease Paternal Uncle    Prostate cancer Paternal Uncle    Hypertension Mother    Diabetes Mother    Heart attack Father    Diabetes Father    Hypertension Father    Colon polyps Sister    Colon cancer Neg Hx    Esophageal  cancer Neg Hx    Rectal cancer Neg Hx    Stomach cancer Neg Hx      Review of Systems:   Please see the history of present illness.    All other systems reviewed and are negative.     EKGs/Labs/Other Test Reviewed:    EKG:  EKG today: Sinus rhythm; prior EKG: Sinus rhythm  Prior CV studies:  TTE 2023: Ejection fraction of 55 to 60% without left ventricular hypertrophy and no significant valvular abnormalities.   Imaging studies that I have independently reviewed today:  CT scan 2022: Emphysema, sarcoid, and aortic atherosclerosis   Recent Labs: 04/01/2021: ALT 45; Magnesium 1.9 04/18/2021: BUN 17; Creatinine, Ser 0.66; Potassium 3.5; Sodium 135 07/13/2021: Hemoglobin 13.1;  Platelets 285.0   Recent Lipid Panel Lab Results  Component Value Date/Time   CHOL 172 03/09/2020 02:38 PM   TRIG 80 03/09/2020 02:38 PM   HDL 41 03/09/2020 02:38 PM   LDLCALC 116 (H) 03/09/2020 02:38 PM    Risk Assessment/Calculations:          Physical Exam:    VS:  BP 122/80   Pulse 79   Ht '5\' 7"'$  (1.702 m)   Wt 233 lb 6.4 oz (105.9 kg)   SpO2 98%   BMI 36.56 kg/m    Wt Readings from Last 3 Encounters:  08/25/21 233 lb 6.4 oz (105.9 kg)  07/13/21 237 lb 9.6 oz (107.8 kg)  04/18/21 245 lb (111.1 kg)    GENERAL:  No apparent distress, AOx3 HEENT:  No carotid bruits, +2 carotid impulses, no scleral icterus CAR: RRR no murmurs, gallops, rubs, or thrills RES:  Clear to auscultation bilaterally ABD:  Soft, nontender, nondistended, positive bowel sounds x 4 VASC:  +2 radial pulses, +2 carotid pulses, palpable pedal pulses NEURO:  CN 2-12 grossly intact; motor and sensory grossly intact PSYCH:  No active depression or anxiety EXT:  No edema, ecchymosis, or cyanosis  Signed, Early Osmond, MD  08/25/2021 [address], [place], [place]  [address] Phone: [phone]; Fax: [phone]   Note:  This document was prepared using Dragon  voice recognition software and may include unintentional dictation errors.

## 2021-08-25 ENCOUNTER — Encounter: Payer: Self-pay | Admitting: Internal Medicine

## 2021-08-25 ENCOUNTER — Encounter: Payer: Self-pay | Admitting: Emergency Medicine

## 2021-08-25 ENCOUNTER — Ambulatory Visit (INDEPENDENT_AMBULATORY_CARE_PROVIDER_SITE_OTHER): Payer: Commercial Managed Care - HMO | Admitting: Internal Medicine

## 2021-08-25 ENCOUNTER — Ambulatory Visit (INDEPENDENT_AMBULATORY_CARE_PROVIDER_SITE_OTHER): Payer: Commercial Managed Care - HMO | Admitting: Emergency Medicine

## 2021-08-25 VITALS — BP 122/80 | HR 79 | Ht 67.0 in | Wt 233.4 lb

## 2021-08-25 DIAGNOSIS — I700 Atherosclerosis of aorta: Secondary | ICD-10-CM

## 2021-08-25 DIAGNOSIS — R072 Precordial pain: Secondary | ICD-10-CM

## 2021-08-25 DIAGNOSIS — E785 Hyperlipidemia, unspecified: Secondary | ICD-10-CM | POA: Diagnosis not present

## 2021-08-25 DIAGNOSIS — D869 Sarcoidosis, unspecified: Secondary | ICD-10-CM | POA: Diagnosis not present

## 2021-08-25 MED ORDER — METOPROLOL TARTRATE 100 MG PO TABS: ORAL_TABLET | ORAL | 0 refills | Status: DC

## 2021-08-25 NOTE — Patient Instructions (Addendum)
Medication Instructions:  No changes *If you need a refill on your cardiac medications before your next appointment, please call your pharmacy*   Lab Work: Today: lipids/Lp(a)  If you have labs (blood work) drawn today and your tests are completely normal, you will receive your results only by: [place] (if you have MyChart) OR A paper copy in the mail If you have any lab test that is abnormal or we need to change your treatment, we will call you to review the results.   Testing/Procedures: Cardiac MRI Cardiac CTA See instructions below   Follow-Up: At [facility], you and your health needs are our priority.  As part of our continuing mission to provide you with exceptional heart care, we have created designated Provider Care Teams.  These Care Teams include your primary Cardiologist (physician) and Advanced Practice Providers (APPs -  Physician Assistants and Nurse Practitioners) who all work together to provide you with the care you need, when you need it.  Your next appointment:   6 month(s)  The format for your next appointment:   In Person  Provider:   Early Osmond, MD        Your cardiac CT will be scheduled at :   [facility] [address] [place], [place] [address] [phone]   Please arrive at the [facility] and Children's Entrance (Entrance C2) of [facility] 30 minutes prior to test start time. You can use the FREE valet parking offered at entrance C (encouraged to control the heart rate for the test)  Proceed to the [facility] Radiology Department (first floor) to check-in and test prep.  All radiology patients and guests should use entrance C2 at [facility], accessed from [facility], even though the hospital's physical address listed is [address].    Please follow these instructions carefully (unless otherwise directed):  Hold all erectile dysfunction medications at least 3 days  (72 hrs) prior to test.  On the Night Before the Test: Be sure to Drink plenty of water. Do not consume any caffeinated/decaffeinated beverages or chocolate 12 hours prior to your test. Do not take any antihistamines 12 hours prior to your test.  On the Day of the Test: Drink plenty of water until 1 hour prior to the test. Do not eat any food 4 hours prior to the test. You may take your regular medications prior to the test.  Take metoprolol (Lopressor) two hours prior to test.  After the Test: Drink plenty of water. After receiving IV contrast, you may experience a mild flushed feeling. This is normal. On occasion, you may experience a mild rash up to 24 hours after the test. This is not dangerous. If this occurs, you can take Benadryl 25 mg and increase your fluid intake. If you experience trouble breathing, this can be serious. If it is severe call 911 IMMEDIATELY. If it is mild, please call our office. If you take any of these medications: Glipizide/Metformin, Avandament, Glucavance, please do not take 48 hours after completing test unless otherwise instructed.  We will call to schedule your test 2-4 weeks out understanding that some insurance companies will need an authorization prior to the service being performed.   For non-scheduling related questions, please contact the cardiac imaging nurse navigator should you have any questions/concerns: Marchia Bond, Cardiac Imaging Nurse Navigator Gordy Clement, Cardiac Imaging Nurse Navigator [facility] Direct Office Dial: [phone]   For scheduling needs,  including cancellations and rescheduling, please call [place], [phone].   ________________________________________________________________________________________________________________    Dennis Bast are scheduled for Cardiac MRI on ______________. Please arrive for your appointment at ______________ ( arrive 30-45 minutes prior to test start time).  ?  [facility] [address] [place], [place] [address] [phone] Please take advantage of the free valet parking available at the MAIN entrance (A entrance).  Proceed to the [facility] Radiology Department (First Floor) for check-in.   Magnetic resonance imaging (MRI) is a painless test that produces images of the inside of the body without using Xrays.  During an MRI, strong magnets and radio waves work together in a Research officer, political party to form detailed images.   MRI images may provide more details about a medical condition than X-rays, CT scans, and ultrasounds can provide.  You may be given earphones to listen for instructions.  You may eat a light breakfast and take medications as ordered. Please avoid stimulants for 12 hr prior to test. (Ie. Caffeine, nicotine, chocolate, or antihistamine medications)  If a contrast material will be used, an IV will be inserted into one of your veins. Contrast material will be injected into your IV. It will leave your body through your urine within a day. You may be told to drink plenty of fluids to help flush the contrast material out of your system.  You will be asked to remove all metal, including: Watch, jewelry, and other metal objects including hearing aids, hair pieces and dentures. Also wearable glucose monitoring systems (ie. Freestyle Libre and Omnipods) (Braces and fillings normally are not a problem.)   TEST WILL TAKE APPROXIMATELY 1 HOUR  PLEASE NOTIFY SCHEDULING AT LEAST 24 HOURS IN ADVANCE IF YOU ARE UNABLE TO KEEP YOUR APPOINTMENT. [phone]  Please call Marchia Bond, cardiac imaging nurse navigator with any questions/concerns. Marchia Bond RN Navigator Cardiac Imaging Gordy Clement RN Navigator Cardiac Imaging [facility] [phone] Office

## 2021-08-25 NOTE — Addendum Note (Signed)
Addended by: Valerie Salts on: 08/25/2021 05:10 PM   Modules accepted: Orders

## 2021-08-25 NOTE — Patient Instructions (Signed)
Agree with restarting Symbicort when we are able to obtain this.  Continue to work with your primary physician for financial assistance. Keep albuterol available to use 2 puffs if needed for shortness of breath, chest tightness, wheezing. We will repeat your CT scan of the chest without contrast in October 2023 to follow your sarcoidosis. Agree with following with dermatology Follow with Dr Lamonte Sakai in October after your CT or sooner if you have any problems.

## 2021-08-25 NOTE — Progress Notes (Signed)
   Subjective:    Patient ID: Nathan Nathan [name], male    DOB: 1970/06/24, 51 y.o.   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  He has been on Symbicort in the past but has had difficulty staying on this medication due to cost and insurance coverage.  He was seen in late May with skin changes, pruritic rash around his right eye and right nostril that seem to occur after cutting the grass.  He was started on Xyzal and treated with prednisone 20 mg daily for 2 weeks. Feels better, rash is better.  Remains on Protonix, fluticasone nasal spray twice a day, Atrovent nasal spray as needed, Zyrtec.  He is trying to get back on assistance program for symbicort.  He has been having exertional SOB w hills, heavier work.    Review of Systems As per HPI     Objective:   Physical Exam Vitals:   08/25/21 1638  BP: 116/80  Pulse: 68  SpO2: 99%  Weight: 234 lb (106.1 kg)  Height: '5\' 7"'$  (1.702 m)   Gen: Pleasant, well-nourished, in no distress,  normal affect  ENT: No lesions, subtle dark resolved rash around his Nathan [name] and medial aspect of his eyes  Neck: No JVD, no stridor  Lungs: No use of accessory muscles, no crackles or wheezing on normal respiration, no wheeze on forced expiration  Cardiovascular: RRR, heart sounds normal, no murmur or gallops, no peripheral edema  Musculoskeletal: No deformities, no cyanosis or clubbing  Neuro: alert, awake, non focal  Skin: Old resolved dark patchy rash right upper extremity      Assessment & Plan:  Sarcoidosis With associated obstruction and restriction.  He is trying to get back on Symbicort but unclear to me whether he is going to be able to obtain patient assistance.  If not then will need to find an alternative.  Given the degree of his obstruction he would probably benefit from an  ICS/LABA/LAMA.  Cost will be a significant issue.  Right now he is using albuterol as needed.  I will plan to repeat his surveillance CT scan of the chest in October to follow his sarcoid.  He follows with dermatology appropriately  Baltazar Apo, MD, PhD 08/25/2021, 5:04 PM [facility] [phone] or if no answer [phone]

## 2021-08-25 NOTE — Assessment & Plan Note (Signed)
With associated obstruction and restriction.  He is trying to get back on Symbicort but unclear to me whether he is going to be able to obtain patient assistance.  If not then will need to find an alternative.  Given the degree of his obstruction he would probably benefit from an ICS/LABA/LAMA.  Cost will be a significant issue.  Right now he is using albuterol as needed.  I will plan to repeat his surveillance CT scan of the chest in October to follow his sarcoid.  He follows with dermatology appropriately

## 2021-08-26 ENCOUNTER — Telehealth: Payer: Self-pay | Admitting: Internal Medicine

## 2021-08-26 DIAGNOSIS — E785 Hyperlipidemia, unspecified: Secondary | ICD-10-CM

## 2021-08-26 LAB — LIPID PANEL
Chol/HDL Ratio: 5.5 ratio — ABNORMAL HIGH (ref 0.0–5.0)
Cholesterol, Total: 215 mg/dL — ABNORMAL HIGH (ref 100–199)
HDL: 39 mg/dL — ABNORMAL LOW (ref >39)
LDL Chol Calc (NIH): 151 mg/dL — ABNORMAL HIGH (ref 0–99)
Triglycerides: 135 mg/dL (ref 0–149)
VLDL Cholesterol Cal: 25 mg/dL (ref 5–40)

## 2021-08-26 LAB — LIPOPROTEIN A (LPA): Lipoprotein (a): 97.9 nmol/L — ABNORMAL HIGH (ref <75.0)

## 2021-08-26 NOTE — Telephone Encounter (Signed)
Patient called to check on lab results.

## 2021-08-26 NOTE — Telephone Encounter (Signed)
Left message for patient to call back. Placed referral to PharmD for lipid management and sent results to him in [place].

## 2021-08-26 NOTE — Telephone Encounter (Signed)
-----   Message from Early Osmond, MD sent at 08/26/2021 12:52 PM EDT ----- Please refer to pharmacy re: lipid management with statin intolerance

## 2021-08-29 ENCOUNTER — Telehealth: Payer: Self-pay | Admitting: Internal Medicine

## 2021-08-29 NOTE — Telephone Encounter (Signed)
Patient is wanting to know if Dr. Ali Lowe has any recommendations for his lab results he can do up until he sees the pharmacist regarding it in August. Please advise.

## 2021-08-29 NOTE — Telephone Encounter (Signed)
Reviewed PharmD appointment is 09/13/21.  Discussed his physical activity.  He works as a Development worker, international aid for [employer].  Gave diet recommendations to avoid/decrease refined sugars/carbs and choose low fat foods.  Pt does not know which statins he has tried in the past.  He is taking Zetia.  I asked him to check with his PCP if possible before his appointment because our pharmacist will want to review those medication intolerances.

## 2021-09-07 ENCOUNTER — Ambulatory Visit: Payer: Commercial Managed Care - HMO | Admitting: Dermatology

## 2021-09-07 ENCOUNTER — Encounter: Payer: Self-pay | Admitting: Dermatology

## 2021-09-07 DIAGNOSIS — D869 Sarcoidosis, unspecified: Secondary | ICD-10-CM

## 2021-09-07 MED ORDER — TACROLIMUS 0.1 % EX OINT: TOPICAL_OINTMENT | Freq: Two times a day (BID) | CUTANEOUS | 11 refills | Status: DC

## 2021-09-09 ENCOUNTER — Telehealth (HOSPITAL_COMMUNITY): Payer: Self-pay | Admitting: *Deleted

## 2021-09-09 NOTE — Telephone Encounter (Signed)
Reaching out to patient to offer assistance regarding upcoming cardiac imaging study; pt verbalizes understanding of appt date/time, parking situation and where to check in, pre-test NPO status and medications ordered, and verified current allergies; name and call back number provided for further questions should they arise  Cissy Galbreath RN Navigator Cardiac Imaging [facility] [phone] office [phone] cell  Patient to take 100mg metoprolol tartrate two hours prior to his cardiac CT scan. He is aware to arrive at 2pm. 

## 2021-09-12 ENCOUNTER — Ambulatory Visit (HOSPITAL_COMMUNITY)
Admission: RE | Admit: 2021-09-12 | Discharge: 2021-09-12 | Disposition: A | Discharge status: Home / Self Care | Payer: Commercial Managed Care - HMO | Source: Ambulatory Visit | Attending: Internal Medicine | Admitting: Internal Medicine

## 2021-09-12 DIAGNOSIS — R072 Precordial pain: Secondary | ICD-10-CM | POA: Insufficient documentation

## 2021-09-12 MED ORDER — IOHEXOL 350 MG/ML SOLN
100.0000 mL | Freq: Once | INTRAVENOUS | Status: AC | PRN
  Administered 2021-09-12: 100 mL via INTRAVENOUS

## 2021-09-12 MED ORDER — NITROGLYCERIN 0.4 MG SL SUBL
0.8000 mg | SUBLINGUAL_TABLET | Freq: Once | SUBLINGUAL | Status: AC
  Administered 2021-09-12: 0.8 mg via SUBLINGUAL

## 2021-09-12 MED ORDER — NITROGLYCERIN 0.4 MG SL SUBL
SUBLINGUAL_TABLET | SUBLINGUAL | Status: AC
  Filled 2021-09-12: qty 2

## 2021-09-12 NOTE — Progress Notes (Signed)
Pt tolerated exam without incident; pt denies lightheadedness or dizziness; pt ambulatory to lobby steady gait noted  

## 2021-09-13 ENCOUNTER — Ambulatory Visit: Payer: Self-pay | Admitting: Dermatology

## 2021-09-13 ENCOUNTER — Ambulatory Visit (INDEPENDENT_AMBULATORY_CARE_PROVIDER_SITE_OTHER): Payer: Commercial Managed Care - HMO | Admitting: Pharmacist

## 2021-09-13 DIAGNOSIS — E785 Hyperlipidemia, unspecified: Secondary | ICD-10-CM

## 2021-09-13 MED ORDER — ATORVASTATIN CALCIUM 10 MG PO TABS
10.0000 mg | ORAL_TABLET | Freq: Every day | ORAL | 3 refills | Status: DC
Start: 2021-09-13 — End: 2022-02-27

## 2021-09-13 NOTE — Progress Notes (Signed)
Patient ID: Nathan [name]                 DOB: 1970/08/21                    MRN: [id number]     HPI: Nathan [name] is a 51 y.o. male patient referred to lipid clinic by Dr. Ali Lowe. PMH is significant for DM, HLD, chronic joint pain, sarcoidosis, neuropathy and a coronary calcium score of 0, minimal atherosclerotic calcification of the aortic arch seen on CT. LPa 97.9.   Patient presents today to lipid clinic. He had a coronary calcium score done yesterday. Unaware of results. I reviewed with him. Patient reports that he has only taken 1 statin, rosuvastatin. States he was having cramping in his hands. Was seen in the ER for this and they suggested it might be his cholesterol medications. His PCP changed his medication to ezetimibe. He has done well on this. Most recently A1C 6.7. LDL-C 151 on ezetimibe '10mg'$  daily. LPa is slightly elevated. Reviewed these labs with patient. Patient has never eaten vegetables, states his parents never made him.   Current Medications: ezetimibe '10mg'$  daily Intolerances: rosuvastatin '5mg'$   Risk Factors: DM, Minimal atherosclerotic calcification of the aortic arch seen on CT LDL goal: <100, could consider <70 due to minimal atherosclerotic calcification of the aortic arch.  Diet: doesn't like vegetables, occasional soda, some juice.  Exercise: Active job as Development worker, international aid  Family History:  Family History  Problem Relation Age of Onset   Heart disease Maternal Grandfather    Heart disease Paternal Uncle    Prostate cancer Paternal Uncle    Hypertension Mother    Diabetes Mother    Heart attack Father    Diabetes Father    Hypertension Father    Colon polyps Sister    Colon cancer Neg Hx    Esophageal cancer Neg Hx    Rectal cancer Neg Hx    Stomach cancer Neg Hx     Social History: quit smoking in 2012  Labs: 08/25/21 TC 215 TG 135 HDL 39 LDL-C 151 LPa 97.9 (ezetimibe '10mg'$  daily)  Past Medical History:  Diagnosis Date   Arthritis     Asthma    'I think I might have asthma"   Chronic back pain    Dyspnea    chronic   Hyperlipidemia    Sarcoidosis     Current Outpatient Medications on File Prior to Visit  Medication Sig Dispense Refill   acetaminophen (TYLENOL) 500 MG tablet Take 1 tablet (500 mg total) by mouth every 6 (six) hours as needed. 30 tablet 0   albuterol (VENTOLIN HFA) 108 (90 Base) MCG/ACT inhaler Inhale 2 puffs into the lungs every 6 (six) hours as needed for wheezing or shortness of breath. 18 g 5   aspirin EC 81 MG tablet Take 1 tablet (81 mg total) by mouth daily. Swallow whole. 90 tablet 3   betamethasone dipropionate 0.05 % cream Apply topically 2 (two) times daily. 30 g 0   budesonide-formoterol (SYMBICORT) 160-4.5 MCG/ACT inhaler Inhale 2 puffs into the lungs 2 (two) times daily.     celecoxib (CELEBREX) 100 MG capsule Take 100 mg by mouth 2 (two) times daily.     cetirizine (ZYRTEC) 10 MG tablet Take 10 mg by mouth daily.     ezetimibe (ZETIA) 10 MG tablet Take 10 mg by mouth daily.     fluticasone (FLONASE) 50 MCG/ACT nasal spray Place 1 spray into both  nostrils 2 (two) times daily. 16 g 5   gabapentin (NEURONTIN) 100 MG capsule Take 100 mg by mouth 3 (three) times daily.     ipratropium (ATROVENT) 0.06 % nasal spray Place 2 sprays into both nostrils 4 (four) times daily as needed for rhinitis. 15 mL 5   ipratropium-albuterol (DUONEB) 0.5-2.5 (3) MG/3ML SOLN      metFORMIN (GLUCOPHAGE-XR) 500 MG 24 hr tablet Take 1,000 mg by mouth 2 (two) times daily.     metoprolol tartrate (LOPRESSOR) 100 MG tablet Take 1 tablet two hours before ct 1 tablet 0   pantoprazole (PROTONIX) 40 MG tablet Take 1 tablet (40 mg total) by mouth daily. 30 tablet 0   tacrolimus (PROTOPIC) 0.1 % ointment Apply topically 2 (two) times daily. 100 g 11   No current facility-administered medications on file prior to visit.    Allergies  Allergen Reactions   Percocet [Oxycodone-Acetaminophen] Itching    Able to take tylenol    Vicodin [Hydrocodone-Acetaminophen] Itching    Assessment/Plan:  1. Hyperlipidemia - LDL-C is above goal of <70. We discussed his CAC score of 0, the plaque seen on aortic arch, time to exposure risk of LDL-C and the meaning of his LPa score. Patient is willing to try another statin. Will add atorvastatin '10mg'$  daily. Continue ezetimibe '10mg'$  daily. Recheck labs in 2 months.  We discussed the 5 modifiable risk factors for CVD (lipids, BP, Tobacco use, diet and exercise) BP is well controlled, patient does not use tobacco, quit many years ago. Lipids discussed above. I have encouraged patient to eat a diet of real food and watch for added sugars and false advertisements. Discussed adding vegetables into diet. I encouraged him to try 1 new vegetable per week. No sugary beverages (soda, sweet tea- very minimal juice). I have encouraged patient to increase exercise even if its just walking at home.   Thank you,   Ramond Dial, Pharm.D, BCPS, CPP [place]  [address], [place], [place] [address]  Phone: [phone]; Fax: [phone]

## 2021-09-13 NOTE — Patient Instructions (Addendum)
Please start taking atorvastatin '10mg'$  daily. Continue taking ezetimibe '10mg'$  daily. Please call me at [phone] with any issues Try eating 1 new vegetable a week Cut out soda and keep juice to very small glass Fasting labs on 9/26. You can come anytime after 7:15 AM  Tips for living a healthier life     Building a Healthy and Balanced Diet Make most of your meal vegetables and fruits -  of your plate. Aim for color and variety, and remember that potatoes don't count as vegetables on the Healthy Eating Plate because of their negative impact on blood sugar.  Go for whole grains -  of your plate. Whole and intact grains--whole wheat, barley, wheat berries, quinoa, oats, brown rice, and foods made with them, such as whole wheat pasta--have a milder effect on blood sugar and insulin than white bread, white rice, and other refined grains.  Protein power -  of your plate. Fish, poultry, beans, and nuts are all healthy, versatile protein sources--they can be mixed into salads, and pair well with vegetables on a plate. Limit red meat, and avoid processed meats such as bacon and sausage.  Healthy plant oils - in moderation. Choose healthy vegetable oils like olive, canola, soy, corn, sunflower, peanut, and others, and avoid partially hydrogenated oils, which contain unhealthy trans fats. Remember that low-fat does not mean "healthy."  Drink water, coffee, or tea. Skip sugary drinks, limit milk and dairy products to one to two servings per day, and limit juice to a small glass per day.  Stay active. The red figure running across the [place] is a reminder that staying active is also important in weight control.  The main message of the Healthy Eating Plate is to focus on diet quality:  The type of carbohydrate in the diet is more important than the amount of carbohydrate in the diet, because some sources of carbohydrate--like vegetables (other than potatoes), fruits,  whole grains, and beans--are healthier than others. The Healthy Eating Plate also advises consumers to avoid sugary beverages, a major source of calories--usually with little nutritional value--in the American diet. The Healthy Eating Plate encourages consumers to use healthy oils, and it does not set a maximum on the percentage of calories people should get each day from healthy sources of fat. In this way, the Healthy Eating Plate recommends the opposite of the low-fat message promoted for decades by the USDA.  [email or web address]  SUGAR  Sugar is a huge problem in the modern day diet. Sugar is a big contributor to heart disease, diabetes, high triglyceride levels, fatty liver disease and obesity. Sugar is hidden in almost all packaged foods/beverages. Added sugar is extra sugar that is added beyond what is naturally found and has no nutritional benefit for your body. The American Heart Association recommends limiting added sugars to no more than 25g for women and 36 grams for men per day. There are many names for sugar including maltose, sucrose (names ending in "ose"), high fructose corn syrup, molasses, cane sugar, corn sweetener, raw sugar, syrup, honey or fruit juice concentrate.   One of the best ways to limit your added sugars is to stop drinking sweetened beverages such as soda, sweet tea, and fruit juice.  There is 65g of added sugars in one 20oz bottle of Coke! That is equal to 7.5 donuts.   Pay attention and read all nutrition facts labels. Below is an examples of a nutrition facts label. The #1 is showing you the total sugars  where the # 2 is showing you the added sugars. This one serving has almost the max amount of added sugars per day!     20 oz Soda 65g Sugar = 7.5 Glazed Donuts  16oz Energy  Drink 54g Sugar = 6.5 Glazed Donuts  Large Sweet  Tea 38g Sugar = 4 Glazed Donuts  20oz Sports  Drink 34g Sugar = 3.5 Glazed  Donuts  8oz Chocolate Milk 24g Sugar =2.5 Glazed Donuts  8oz Orange  Juice 21g Sugar = 2 Glazed Donuts  1 Juice Box 14g Sugar = 1.5 Glazed Donuts  16oz Water= NO SUGAR!!  EXERCISE  Exercise is good. We've all heard that. In an ideal world, we would all have time and resources to get plenty of it. When you are active, your heart pumps more efficiently and you will feel better.  Multiple studies show that even walking regularly has benefits that include living a longer life. The American Heart Association recommends 150 minutes per week of exercise (30 minutes per day most days of the week). You can do this in any increment you wish. Nine or more 10-minute walks count. So does an hour-long exercise class. Break the time apart into what will work in your life. Some of the best things you can do include walking briskly, jogging, cycling or swimming laps. Not everyone is ready to "exercise." Sometimes we need to start with just getting active. Here are some easy ways to be more active throughout the day:  Take the stairs instead of the elevator  Go for a 10-15 minute walk during your lunch break (find a friend to make it more enjoyable)  When shopping, park at the back of the parking lot  If you take public transportation, get off one stop early and walk the extra distance  Pace around while making phone calls  Check with your doctor if you aren't sure what your limitations may be. Always remember to drink plenty of water when doing any type of exercise. Don't feel like a failure if you're not getting the 90-150 minutes per week. If you started by being a couch potato, then just a 10-minute walk each day is a huge improvement. Start with little victories and work your way up.   HEALTHY EATING TIPS  When looking to improve your eating habits, whether to lose weight, lower blood pressure or just be healthier, it helps to know what a serving size is.   Grains 1 slice of bread,  bagel,  cup  pasta or rice  Vegetables 1 cup fresh or raw vegetables,  cup cooked or canned Fruits 1 piece of medium sized fruit,  cup canned,   Meats/Proteins  cup dried       1 oz meat, 1 egg,  cup cooked beans, nuts or seeds  Dairy        Fats Individual yogurt container, 1 cup (8oz)    1 teaspoon margarine/butter or vegetable  milk or milk alternative, 1 slice of cheese          oil; 1 tablespoon mayonnaise or salad dressing                  Plan ahead: make a menu of the meals for a week then create a grocery list to go with that menu. Consider meals that easily stretch into a night of leftovers, such as stews or casseroles. Or consider making two of your favorite meal and put one in the freezer for another night.  Try a night or two each week that is "meatless" or "no cook" such as salads. When you get home from the grocery store wash and prepare your vegetables and fruits. Then when you need them they are ready to go.   Tips for going to the grocery store:  [place] store or generic brands  Check the weekly ad from your store on-line or in their in-store flyer  Look at the unit price on the shelf tag to compare/contrast the costs of different items  Buy fruits/vegetables in season  Carrots, bananas and apples are low-cost, naturally healthy items  If meats or frozen vegetables are on sale, buy some extras and put in your freezer  Limit buying prepared or "ready to eat" items, even if they are pre-made salads or fruit snacks  Do not shop when you're hungry  Foods at eye level tend to be more expensive. Look on the high and low shelves for deals.  Consider shopping at the farmer's market for fresh foods in season.  Avoid the cookie and chip aisles (these are expensive, high in calories and low in nutritional value). Shop on the outside of the grocery store.  Healthy food preparations:  If you can't get lean hamburger, be sure to drain the fat when cooking  Steam, saut (in olive oil), grill or bake  foods  Experiment with different seasonings to avoid adding salt to your foods. Kosher salt, sea salt and Himalayan salt are all still salt and should be avoided. Try seasoning food with onion, garlic, thyme, rosemary, basil ect. Onion powder or garlic powder is ok. Avoid if it says salt (ie garlic salt).

## 2021-10-01 ENCOUNTER — Encounter: Payer: Self-pay | Admitting: Dermatology

## 2021-10-01 NOTE — Progress Notes (Signed)
   New Patient   Subjective  Nathan [name] is a 51 y.o. male who presents for the following: Rash (Rash like lesions that turn dark and itch some times face & arms. Patient  sees Dr Lamonte Sakai at pulmonology for Sarcoidosis).  Sarcoidosis, discuss options for cutaneous involvement. Location:  Duration:  Quality:  Associated Signs/Symptoms: Modifying Factors:  Severity:  Timing: Context:    The following portions of the chart were reviewed this encounter and updated as appropriate:  Tobacco  Allergies  Meds  Problems  Med Hx  Surg Hx  Fam Hx      Objective  Well appearing patient in no apparent distress; mood and affect are within normal limits. Head - Anterior (Face) All medical records related to patient's sarcoidosis reviewed before his visit. Granulomatous 67m pink brown dermal papules nostrils, ears; more dermatitic changes periorbital. No enlargement cervical nodes, lacrimal/parotid glands. No intra oral pathology. No synovitis hands. I discussed what is known and much that is unknown relating to sarcoidosis in some detail.  Multiple treatment options reviewed with emphasis that any systemic signs of sarcoidosis will always have priority over his cutaneous changes.  Because of the periorbital involvement we will initiate therapy with the noncortisone tacrolimus ointment which she will apply nightly for minimal of 10 to 12 weeks.  If the more papular lesions do not respond we may consider trying diluted intralesional triamcinolone.    A focused examination was performed including head, neck, intraoral, arms, lymph nodes.. Relevant physical exam findings are noted in the Assessment and Plan.   Assessment & Plan  Sarcoidosis Head - Anterior (Face)  Tacrolimus ointment nightly.  Follow-up 3 months.  tacrolimus (PROTOPIC) 0.1 % ointment - Head - Anterior (Face) Apply topically 2 (two) times daily.  Related Medications albuterol (VENTOLIN HFA) 108 (90 Base) MCG/ACT  inhaler Inhale 2 puffs into the lungs every 6 (six) hours as needed for wheezing or shortness of breath.

## 2021-11-08 ENCOUNTER — Ambulatory Visit: Payer: Commercial Managed Care - HMO | Attending: Internal Medicine

## 2021-11-08 DIAGNOSIS — E785 Hyperlipidemia, unspecified: Secondary | ICD-10-CM

## 2021-11-09 LAB — LIPID PANEL
Chol/HDL Ratio: 3.4 ratio (ref 0.0–5.0)
Cholesterol, Total: 126 mg/dL (ref 100–199)
HDL: 37 mg/dL — ABNORMAL LOW (ref >39)
LDL Chol Calc (NIH): 74 mg/dL (ref 0–99)
Triglycerides: 73 mg/dL (ref 0–149)
VLDL Cholesterol Cal: 15 mg/dL (ref 5–40)

## 2021-11-09 LAB — HEPATIC FUNCTION PANEL
ALT: 29 IU/L (ref 0–44)
AST: 35 IU/L (ref 0–40)
Albumin: 4.7 g/dL (ref 4.1–5.1)
Alkaline Phosphatase: 81 IU/L (ref 44–121)
Bilirubin Total: 1 mg/dL (ref 0.0–1.2)
Bilirubin, Direct: 0.24 mg/dL (ref 0.00–0.40)
Total Protein: 7 g/dL (ref 6.0–8.5)

## 2021-11-09 LAB — APOLIPOPROTEIN B: Apolipoprotein B: 73 mg/dL (ref <90)

## 2021-11-10 ENCOUNTER — Telehealth: Payer: Self-pay | Admitting: Pharmacist

## 2021-11-10 ENCOUNTER — Telehealth: Payer: Self-pay | Admitting: Internal Medicine

## 2021-11-10 NOTE — Telephone Encounter (Signed)
Called patient back and reviewed lab results.  He is pleased to hear results.  Will continue atorvastatin and zetia.  Scheduled his 71mov with Dr. TAli Lowe

## 2021-11-10 NOTE — Telephone Encounter (Signed)
Patient is calling for his lab results. °

## 2021-11-10 NOTE — Telephone Encounter (Signed)
Patient advised of labs and to continue atorvastatin and ezetimibe.

## 2021-11-10 NOTE — Telephone Encounter (Signed)
Called pt and LVM for him to call back. Calling to let patient know lipids are at goal and continue current medications.

## 2021-11-10 NOTE — Telephone Encounter (Signed)
Pt returning Pharmacist call. Call transferred

## 2021-11-14 NOTE — Progress Notes (Signed)
Office Visit Note  Patient: Nathan [name]             Date of Birth: Jun 25, 1970           MRN: [id number]             PCP: [facility] Referring: [facility] Visit Date: 11/15/2021 Occupation: Former landscaper  Subjective:  New Patient (Initial Visit) (Referred to figure out what to do about joint pain, inflammation, swelling--especially back and base of neck.)   History of Present Illness: Nathan [name] is a 51 y.o. male here for joint pains with history of pulmonary and cutaneous sacoidosis.  His worst affected area is pretty constant pain in the base of the neck area frequently with radiation up the neck and posterior headaches associated with this.  He has had problems going on for years but seems to be progressively getting somewhat worse.  He does get some pain in extremities elsewhere but by far the most limiting for him is in the base of the neck.  He has tried multiple NSAIDs including meloxicam and Celebrex is also tried muscle relaxant medications none of which were very significantly beneficial.  He is tried physical therapy completing courses and had local injections that did not greatly relieve symptoms.  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  He had most recent neck MRI in 2022 based on findings had discussed surgery options but is looking to avoid this due to concern about complications or long-term needing additional revision. Original diagnosis of sarcoidosis was based on pulmonary imaging finding and treated with very prolonged prednisone taper.  Initially only had respiratory symptoms with some mild dyspnea and coughing that improved with the steroid treatments.  More recently this year he had an episode of similar symptoms coming back and was treated with a round of steroids from pulmonology clinic in noticed a increase in blood glucose up to 400s.  He is also developed some new skin rashes on the face  previously had involvement on extremities but no facial rashes with original symptoms.  He is prescribed topical medication by Dr. Denna Haggard but has not seen any difference so far.   Activities of Daily Living:  Patient reports morning stiffness for 30-60 minutes.   Patient Reports nocturnal pain.  Difficulty dressing/grooming: Denies Difficulty climbing stairs: Denies Difficulty getting out of chair: Reports Difficulty using hands for taps, buttons, cutlery, and/or writing: Reports  Review of Systems  Constitutional:  Positive for fatigue.  HENT:  Negative for mouth sores and mouth dryness.   Eyes:  Positive for dryness.  Respiratory:  Positive for shortness of breath.   Cardiovascular:  Positive for chest pain and palpitations.  Gastrointestinal:  Positive for constipation and diarrhea. Negative for blood in stool.  Endocrine: Negative for increased urination.  Genitourinary:  Negative for involuntary urination.  Musculoskeletal:  Positive for joint pain, joint pain, joint swelling, myalgias, muscle weakness, morning stiffness, muscle tenderness and myalgias. Negative for gait problem.  Skin:  Positive for rash and sensitivity to sunlight. Negative for color change and hair loss.  Allergic/Immunologic: Negative for susceptible to infections.  Neurological:  Positive for dizziness and headaches.  Hematological:  Negative for swollen glands.  Psychiatric/Behavioral:  Positive for depressed mood and sleep disturbance. The patient is nervous/anxious.     PMFS History:  Patient Active Problem List   Diagnosis Date Noted   High risk medication use 11/15/2021   HLD (hyperlipidemia) 09/13/2021  Asthma 11/10/2020   COVID-19 virus infection 10/21/2019   Shoulder pain, left 10/23/2016   Other chest pain 08/22/2014   Upper airway cough syndrome 07/23/2014   Sarcoidosis 03/06/2011    Past Medical History:  Diagnosis Date   Arthritis    Asthma    'I think I might have asthma"   Chronic  back pain    Dyspnea    chronic   Hyperlipidemia    Sarcoidosis     Family History  Problem Relation Age of Onset   Hypertension Mother    Diabetes Mother    Heart attack Father    Diabetes Father    Hypertension Father    Gout Father    Colon polyps Sister    Heart disease Paternal Uncle    Prostate cancer Paternal Uncle    Heart disease Maternal Grandfather    Anemia Daughter    Anemia Daughter    Colon cancer Neg Hx    Esophageal cancer Neg Hx    Rectal cancer Neg Hx    Stomach cancer Neg Hx    Past Surgical History:  Procedure Laterality Date   NO PAST SURGERIES     Social History   Social History Narrative   Not on file   Immunization History  Administered Date(s) Administered   Influenza,inj,Quad PF,6+ Mos 11/10/2020   PFIZER(Purple Top)SARS-COV-2 Vaccination 10/27/2020     Objective: Vital Signs: BP 131/82 (BP Location: Right Arm, Patient Position: Sitting, Cuff Size: Normal)   Pulse 76   Resp 14   Ht 5' 8.5" (1.74 m)   Wt 241 lb 3.2 oz (109.4 kg)   BMI 36.14 kg/m    Physical Exam Constitutional:      Appearance: He is obese.  HENT:     Mouth/Throat:     Mouth: Mucous membranes are moist.     Pharynx: Oropharynx is clear.  Eyes:     Conjunctiva/sclera: Conjunctivae normal.  Cardiovascular:     Rate and Rhythm: Normal rate and regular rhythm.  Pulmonary:     Effort: Pulmonary effort is normal.     Breath sounds: Normal breath sounds.  Musculoskeletal:     Right lower leg: No edema.     Left lower leg: No edema.  Skin:    Findings: Rash present.     Comments: Faintly hyperpigmented papules on central face and sides of nose Flat old hyperpigmented macules on right arm flexor surface  Neurological:     Mental Status: He is alert.  Psychiatric:        Mood and Affect: Mood normal.      Musculoskeletal Exam:  Neck full ROM stiffness with flexion and lateral rotation Very tense muscle tone in neck paraspinal muscles, upper back above and  between scapulae Shoulders full ROM no tenderness or swelling Elbows full ROM no tenderness or swelling Wrists full ROM no tenderness or swelling Fingers full ROM no tenderness or swelling Knees full ROM no tenderness or swelling Ankles full ROM no tenderness or swelling Pes planus bilaterally MTPs full ROM no tenderness or swelling   Investigation: No additional findings.  Imaging: MR CARDIAC VELOCITY FLOW MAP  Result Date: 11/16/2021 CLINICAL DATA:  Evaluate for cardiac sarcoid EXAM: CARDIAC MRI TECHNIQUE: The patient was scanned on a 1.5 Tesla Siemens magnet. A dedicated cardiac coil was used. Functional imaging was done using Fiesta sequences. 2,3, and 4 chamber views were done to assess for RWMA's. Modified Simpson's rule using a short axis stack was used to calculate  an ejection fraction on a dedicated work Conservation officer, nature. The patient received 12 cc of Gadavist. After 10 minutes inversion recovery sequences were used to assess for infiltration and scar tissue. Phase contrast velocity mapping was performed. CONTRAST:  12 cc  of Gadavist FINDINGS: Left ventricle: -Normal wall thickness -Normal size -Normal systolic function -Elevated ECV (33%) -Basal lateral subendocardial LGE LV EF: 61% (Normal 56-78%) Absolute volumes: LV EDV: 141m (Normal 77-195 mL) LV ESV: 75m(Normal 19-72 mL) LV SV: 20857mNormal 51-133 mL) CO: 8.8L/min (Normal 2.8-8.8 L/min) Indexed volumes: LV EDV: 27m62m-m (Normal 47-92 mL/sq-m) LV ESV: 31mL50mm (Normal 13-30 mL/sq-m) LV SV: 47mL/41m (Normal 32-62 mL/sq-m) CI: 3.8L/min/sq-m (Normal 1.7-4.2 L/min/sq-m) Right ventricle: Normal size and systolic function RV EF:  50% (Normal 47-74%) Absolute volumes: RV EDV: 210mL (58mal 88-227 mL) RV ESV: 104mL (N33ml 23-103 mL) RV SV: 106mL (No72m 52-138 mL) CO: 8.7L/min (Normal 2.8-8.8 L/min) Indexed volumes: RV EDV: 91mL/sq-m35mrmal 55-105 mL/sq-m) RV ESV: 45mL/sq-m 80mmal 15-43 mL/sq-m) RV SV: 46mL/sq-m (40mal  32-64 mL/sq-m) CI: 3.8L/min/sq-m (Normal 1.7-4.2 L/min/sq-m) Left atrium: Mild enlargement Right atrium: Normal size Mitral valve: Trivial regurgitation Aortic valve: Trciuspid. No regurgitation Tricuspid valve: Trivial regurgitation Pulmonic valve: No regurgitation Aorta: Normal proximal ascending aorta Pericardium: Normal IMPRESSION: 1. Small area of basal lateral subendocardial LGE. Given known extracardiac sarcoid, cardiac sarcoid is on differential though tends to present with multifocal LGE. Prior myocarditis also on differential. Recommend cardiac PET to evaluate for cardiac sarcoid 2.  Normal LV size, wall thickness, and systolic function (EF 61%) 3.  Nor82% RV size and systolic function (EF 50%) Electro50%ally Signed   By: Khyree Carillo Oswaldo Milian10/05/2021 21:35   MR CARDIAC MORPHOLOGY W WO CONTRAST  Result Date: 11/16/2021 CLINICAL DATA:  Evaluate for cardiac sarcoid EXAM: CARDIAC MRI TECHNIQUE: The patient was scanned on a 1.5 Tesla Siemens magnet. A dedicated cardiac coil was used. Functional imaging was done using Fiesta sequences. 2,3, and 4 chamber views were done to assess for RWMA's. Modified Simpson's rule using a short axis stack was used to calculate an ejection fraction on a dedicated work station usinConservation officer, naturet received 12 cc of Gadavist. After 10 minutes inversion recovery sequences were used to assess for infiltration and scar tissue. Phase contrast velocity mapping was performed. CONTRAST:  12 cc  of Gadavist FINDINGS: Left ventricle: -Normal wall thickness -Normal size -Normal systolic function -Elevated ECV (33%) -Basal lateral subendocardial LGE LV EF: 61% (Normal 56-78%) Absolute volumes: LV EDV: 127mL (Normal10m195 mL) LV ESV: 70mL (Normal 47m2 mL) LV SV: 208mL (Normal 586m3 mL) CO: 8.8L/min (Normal 2.8-8.8 L/min) Indexed volumes: LV EDV: 27mL/sq-m (Norm70m7-92 mL/sq-m) LV ESV: 31mL/sq-m (Norma81m-30 mL/sq-m) LV SV: 47mL/sq-m (Normal27m62 mL/sq-m)  CI: 3.8L/min/sq-m (Normal 1.7-4.2 L/min/sq-m) Right ventricle: Normal size and systolic function RV EF:  50% (Normal 47-74%) Absolute volumes: RV EDV: 210mL (Normal 88-222m) RV ESV: 104mL (Normal 23-10352m RV SV: 106mL (Normal 52-138 56mCO: 8.7L/min (Normal 2.8-8.8 L/min) Indexed volumes: RV EDV: 91mL/sq-m (Normal 55-27mmL/sq-m) RV ESV: 45mL/sq-m (Normal 15-44m/sq-m) RV SV: 46mL/sq-m (Normal 32-6481msq-m) CI: 3.8L/min/sq-m (Normal 1.7-4.2 L/min/sq-m) Left atrium: Mild enlargement Right atrium: Normal size Mitral valve: Trivial regurgitation Aortic valve: Trciuspid.  No regurgitation Tricuspid valve: Trivial regurgitation Pulmonic valve: No regurgitation Aorta: Normal proximal ascending aorta Pericardium: Normal IMPRESSION: 1. Small area of basal lateral subendocardial LGE. Given known extracardiac sarcoid, cardiac sarcoid is on differential though tends to present with multifocal LGE. Prior  myocarditis also on differential. Recommend cardiac PET to evaluate for cardiac sarcoid 2.  Normal LV size, wall thickness, and systolic function (EF 49%) 3.  Normal RV size and systolic function (EF 20%) Electronically Signed   By: Oswaldo Milian M.D.   On: 11/16/2021 21:34    Recent Labs: Lab Results  Component Value Date   WBC 4.3 11/15/2021   HGB 12.6 (L) 11/15/2021   PLT 239 11/15/2021   NA 143 11/15/2021   K 4.3 11/15/2021   CL 107 11/15/2021   CO2 30 11/15/2021   GLUCOSE 113 (H) 11/15/2021   BUN 12 11/15/2021   CREATININE 0.69 11/15/2021   BILITOT 0.8 11/15/2021   ALKPHOS 81 11/08/2021   AST 30 11/15/2021   ALT 28 11/15/2021   PROT 6.9 11/15/2021   ALBUMIN 4.7 11/08/2021   CALCIUM 9.1 11/15/2021   GFRAA >60 09/17/2019    Speciality Comments: No specialty comments available.  Procedures:  No procedures performed Allergies: Tizanidine, Hydrocodone-acetaminophen, Oxycodone-acetaminophen, Percocet [oxycodone-acetaminophen], and Vicodin [hydrocodone-acetaminophen]   Assessment /  Plan:     Visit Diagnoses: Sarcoidosis - Plan: Sedimentation rate, C-reactive protein  Appears to have some disease activity if only because of the active skin rashes on his face that are highly characteristic.  Previous serologic markers such as ACE level were normal earlier this year even prior to steroid treatment.  We will recheck sedimentation rate and CRP at this time.  It is not clear how much neck pains are directly related to his inflammatory disease the persistence of symptoms despite disease remission suggest against there being much relationship.  However I do think there is enough evidence to probably recommend trying systemic steroid sparing medication may be able to see if he notices much benefit or not.  High risk medication use - Plan: CBC with Differential/Platelet, COMPLETE METABOLIC PANEL WITH GFR, Hepatitis B core antibody, IgM, Hepatitis B surface antigen, Hepatitis C antibody  Discussed potential indications for steroid sparing DMARD treatment such as methotrexate.  Checking baseline labs CBC CMP and hepatitis screening.  Discussed risks of medications including GI intolerance, cytopenias and hepatotoxicity, and need for frequent lab monitoring and avoiding significant alcohol consumption.  Orders: Orders Placed This Encounter  Procedures   Sedimentation rate   C-reactive protein   CBC with Differential/Platelet   COMPLETE METABOLIC PANEL WITH GFR   Hepatitis B core antibody, IgM   Hepatitis B surface antigen   Hepatitis C antibody   No orders of the defined types were placed in this encounter.    Follow-Up Instructions: Return in about 4 weeks (around 12/13/2021) for New pt sarcoidosis ?MTX start f/u ~37mo   CCollier Salina MD  Note - This record has been created using [employer]  Chart creation errors have been sought, but may not always  have been located. Such creation errors do not reflect on  the standard of medical care.

## 2021-11-15 ENCOUNTER — Telehealth (HOSPITAL_COMMUNITY): Payer: Self-pay | Admitting: *Deleted

## 2021-11-15 ENCOUNTER — Encounter: Payer: Self-pay | Admitting: Internal Medicine

## 2021-11-15 ENCOUNTER — Ambulatory Visit (INDEPENDENT_AMBULATORY_CARE_PROVIDER_SITE_OTHER): Payer: Commercial Managed Care - HMO | Admitting: Internal Medicine

## 2021-11-15 VITALS — BP 131/82 | HR 76 | Resp 14 | Ht 68.5 in | Wt 241.2 lb

## 2021-11-15 DIAGNOSIS — D869 Sarcoidosis, unspecified: Secondary | ICD-10-CM

## 2021-11-15 DIAGNOSIS — Z79899 Other long term (current) drug therapy: Secondary | ICD-10-CM

## 2021-11-15 NOTE — Telephone Encounter (Signed)
Attempted to call patient regarding upcoming cardiac MRI appointment. Left message on voicemail with name and callback number  Jayin Derousse RN Navigator Cardiac Imaging [facility] [phone] Office [phone] Cell  

## 2021-11-15 NOTE — Telephone Encounter (Signed)
Patient returning call regarding upcoming cardiac imaging study; pt verbalizes understanding of appt date/time, parking situation and where to check in, and verified current allergies; name and call back number provided for further questions should they arise  Gordy Clement RN Navigator Cardiac [place] [phone] office [phone] cell  Patient denies claustrophobia or metal.

## 2021-11-16 ENCOUNTER — Other Ambulatory Visit: Payer: Self-pay | Admitting: Internal Medicine

## 2021-11-16 ENCOUNTER — Ambulatory Visit (HOSPITAL_COMMUNITY)
Admission: RE | Admit: 2021-11-16 | Discharge: 2021-11-16 | Disposition: A | Discharge status: Home / Self Care | Payer: Commercial Managed Care - HMO | Source: Ambulatory Visit | Attending: Internal Medicine | Admitting: Internal Medicine

## 2021-11-16 DIAGNOSIS — D869 Sarcoidosis, unspecified: Secondary | ICD-10-CM | POA: Diagnosis present

## 2021-11-16 DIAGNOSIS — Z79899 Other long term (current) drug therapy: Secondary | ICD-10-CM | POA: Diagnosis present

## 2021-11-16 LAB — COMPLETE METABOLIC PANEL WITH GFR
AG Ratio: 1.6 (calc) (ref 1.0–2.5)
ALT: 28 U/L (ref 9–46)
AST: 30 U/L (ref 10–40)
Albumin: 4.2 g/dL (ref 3.6–5.1)
Alkaline phosphatase (APISO): 69 U/L (ref 36–130)
BUN: 12 mg/dL (ref 7–25)
CO2: 30 mmol/L (ref 20–32)
Calcium: 9.1 mg/dL (ref 8.6–10.3)
Chloride: 107 mmol/L (ref 98–110)
Creat: 0.69 mg/dL (ref 0.60–1.29)
Globulin: 2.7 g/dL (calc) (ref 1.9–3.7)
Glucose, Bld: 113 mg/dL — ABNORMAL HIGH (ref 65–99)
Potassium: 4.3 mmol/L (ref 3.5–5.3)
Sodium: 143 mmol/L (ref 135–146)
Total Bilirubin: 0.8 mg/dL (ref 0.2–1.2)
Total Protein: 6.9 g/dL (ref 6.1–8.1)
eGFR: 113 mL/min/{1.73_m2} (ref >=60)

## 2021-11-16 LAB — CBC WITH DIFFERENTIAL/PLATELET
Absolute Monocytes: 503 cells/uL (ref 200–950)
Basophils Absolute: 9 cells/uL (ref 0–200)
Basophils Relative: 0.2 %
Eosinophils Absolute: 241 cells/uL (ref 15–500)
Eosinophils Relative: 5.6 %
HCT: 38.3 % — ABNORMAL LOW (ref 38.5–50.0)
Hemoglobin: 12.6 g/dL — ABNORMAL LOW (ref 13.2–17.1)
Lymphs Abs: 989 cells/uL (ref 850–3900)
MCH: 27.6 pg (ref 27.0–33.0)
MCHC: 32.9 g/dL (ref 32.0–36.0)
MCV: 84 fL (ref 80.0–100.0)
MPV: 10.2 fL (ref 7.5–12.5)
Monocytes Relative: 11.7 %
Neutro Abs: 2559 cells/uL (ref 1500–7800)
Neutrophils Relative %: 59.5 %
Platelets: 239 10*3/uL (ref 140–400)
RBC: 4.56 10*6/uL (ref 4.20–5.80)
RDW: 13.9 % (ref 11.0–15.0)
Total Lymphocyte: 23 %
WBC: 4.3 10*3/uL (ref 3.8–10.8)

## 2021-11-16 LAB — HEPATITIS B SURFACE ANTIGEN: Hepatitis B Surface Ag: NONREACTIVE

## 2021-11-16 LAB — HEPATITIS B CORE ANTIBODY, IGM: Hep B C IgM: NONREACTIVE

## 2021-11-16 LAB — SEDIMENTATION RATE: Sed Rate: 6 mm/h (ref 0–15)

## 2021-11-16 LAB — HEPATITIS C ANTIBODY: Hepatitis C Ab: NONREACTIVE

## 2021-11-16 LAB — C-REACTIVE PROTEIN: CRP: 3.1 mg/L (ref <8.0)

## 2021-11-16 MED ORDER — GADOPICLENOL 0.5 MMOL/ML IV SOLN
12.0000 mL | Freq: Once | INTRAVENOUS | Status: AC | PRN
  Administered 2021-11-16: 12 mL via INTRAVENOUS

## 2021-11-21 ENCOUNTER — Telehealth: Payer: Self-pay | Admitting: *Deleted

## 2021-11-21 DIAGNOSIS — D869 Sarcoidosis, unspecified: Secondary | ICD-10-CM

## 2021-11-21 MED ORDER — FOLIC ACID 1 MG PO TABS: 1.0000 mg | ORAL_TABLET | Freq: Every day | ORAL | 0 refills | Status: DC

## 2021-11-21 MED ORDER — METHOTREXATE 2.5 MG PO TABS: 15.0000 mg | ORAL_TABLET | ORAL | 0 refills | Status: DC

## 2021-11-21 NOTE — Telephone Encounter (Signed)
Prescription sent to pharmacy today, see associated result note for details.

## 2021-11-21 NOTE — Addendum Note (Signed)
Addended by: Collier Salina on: 11/21/2021 10:18 AM   Modules accepted: Orders

## 2021-11-21 NOTE — Telephone Encounter (Signed)
Patient contacted the office requesting his lab results. Patient advised they have not been reviewed by the provider yet and we will call him to discuss once results have been reviewed.  Please review results and advise. Thanks!

## 2021-11-22 ENCOUNTER — Telehealth: Payer: Self-pay | Admitting: Internal Medicine

## 2021-11-22 ENCOUNTER — Ambulatory Visit
Admission: RE | Admit: 2021-11-22 | Discharge: 2021-11-22 | Disposition: A | Discharge status: Home / Self Care | Payer: Commercial Managed Care - HMO | Source: Ambulatory Visit | Attending: Emergency Medicine | Admitting: Emergency Medicine

## 2021-11-22 ENCOUNTER — Ambulatory Visit (HOSPITAL_COMMUNITY): Payer: Commercial Managed Care - HMO

## 2021-11-22 DIAGNOSIS — D869 Sarcoidosis, unspecified: Secondary | ICD-10-CM

## 2021-11-22 NOTE — Telephone Encounter (Signed)
There is a weak interaction between methotrexate and celebrex this can increase the concentration of methotrexate in the body by a small amount. We are starting him at a low dose and these medications are commonly used together without problems so I do not recommend any change needed.

## 2021-11-22 NOTE — Telephone Encounter (Signed)
Patient called stating when he picked up his prescription of Methotrexate the pharmacist wanted him to contact Dr. Benjamine [name] to let him know he takes Celebrex.  Patient requested a return call.

## 2021-11-22 NOTE — Telephone Encounter (Signed)
Pt is requesting call back in regards to results.  

## 2021-11-22 NOTE — Telephone Encounter (Signed)
Left message that the cardiac MRI results are still in Dr. Dara Hoyer work and have not been sent to me yet.  Adv that the doctor may forward his notes to the patient directly once he reviews fully. Otherwise I will call him with the results.

## 2021-11-22 NOTE — Telephone Encounter (Signed)
Called patient and informed him that Dr. Benjamine Mola states there is a weak interaction between methotrexate and celebrex this can increase the concentration of methotrexate in the body by a small amount. We are starting him at a low dose and these medications are commonly used together without problems so Dr. Benjamine Mola does not recommend any change needed.   Patient expressed verbal understanding of message.

## 2021-11-25 ENCOUNTER — Telehealth: Payer: Self-pay | Admitting: Internal Medicine

## 2021-11-25 ENCOUNTER — Telehealth: Payer: Self-pay | Admitting: Emergency Medicine

## 2021-11-25 DIAGNOSIS — M4802 Spinal stenosis, cervical region: Secondary | ICD-10-CM | POA: Insufficient documentation

## 2021-11-25 NOTE — Telephone Encounter (Signed)
Please let the patient know that I reviewed his CT scan of the chest.  There are stable signs of sarcoidosis including some evidence of old lung inflammation, enlarged lymph nodes.  There has been no progression.  This is good news.

## 2021-11-25 NOTE — Telephone Encounter (Signed)
Called and spoke with pt who is requesting to know the results of recent CT. Dr. Lamonte Sakai, please advise.

## 2021-11-25 NOTE — Telephone Encounter (Signed)
Patient was calling for results from his MRI. Please advise

## 2021-11-25 NOTE — Telephone Encounter (Signed)
Called and spoke with pt letting him know the results of the CT and he verbalized understanding. Nothing further needed.

## 2021-11-28 NOTE — Telephone Encounter (Signed)
I spoke with the patient and updated him w recommendation.  Will work to arrange PET CT for sarcoid at [facility].

## 2021-11-28 NOTE — Telephone Encounter (Signed)
Pt is calling again to get results of his MRI. Requesting call back.

## 2021-11-28 NOTE — Telephone Encounter (Signed)
-----   Message from Early Osmond, MD sent at 11/28/2021  8:59 AM EDT ----- Yes, sorry it has taken me a bit to review this.  It is not definitive for sarcoid, let's send him to [facility] for a PET CT for sarcoid.

## 2021-12-06 NOTE — Telephone Encounter (Signed)
Called PET CT Dept [facility]  [phone] and left VM to call back to arrange.

## 2021-12-07 ENCOUNTER — Encounter: Payer: Self-pay | Admitting: Emergency Medicine

## 2021-12-07 ENCOUNTER — Telehealth: Payer: Self-pay

## 2021-12-07 ENCOUNTER — Ambulatory Visit (INDEPENDENT_AMBULATORY_CARE_PROVIDER_SITE_OTHER): Payer: Commercial Managed Care - HMO | Admitting: Emergency Medicine

## 2021-12-07 VITALS — BP 124/80 | HR 76 | Temp 97.9°F | Ht 67.0 in | Wt 241.8 lb

## 2021-12-07 DIAGNOSIS — D869 Sarcoidosis, unspecified: Secondary | ICD-10-CM

## 2021-12-07 DIAGNOSIS — J4520 Mild intermittent asthma, uncomplicated: Secondary | ICD-10-CM | POA: Diagnosis not present

## 2021-12-07 MED ORDER — STIOLTO RESPIMAT 2.5-2.5 MCG/ACT IN AERS: 2.0000 | INHALATION_SPRAY | Freq: Every day | RESPIRATORY_TRACT | 0 refills | Status: DC

## 2021-12-07 NOTE — Patient Instructions (Addendum)
We reviewed your CT scan of the chest today.  This is stable compared with your priors. We will try starting Stiolto 2 puffs once daily.  If you benefit and if we can get good insurance coverage then we will try to continue this medication.  If this medication is not covered we will look for similar alternative Keep your albuterol available to use after 4 hours if needed for shortness of breath, chest tightness, wheezing. Continue methotrexate and follow-up with Dr. Benjamine Mola as planned Agree with cardiac PET at [facility] as planned Follow with Dr Lamonte Sakai in 6 months or sooner if you have any problems

## 2021-12-07 NOTE — Assessment & Plan Note (Signed)
Reviewed CT chest with him today, stable, reassuring.  He does still have a faint rash and is under evaluation for possible cardiac involvement.  Cardiac MRI was equivocal but a cardiac PET has been planned at [facility].  He does have joint pain, question whether this is related to his sarcoid.  He has been started on methotrexate and is following with Dr. Benjamine Mola with rheumatology.  We reviewed your CT scan of the chest today.  This is stable compared with your priors. Continue methotrexate and follow-up with Dr. Benjamine Mola as planned Agree with cardiac PET at [facility] as planned Follow with Dr Lamonte Sakai in 6 months or sooner if you have any problems

## 2021-12-07 NOTE — Progress Notes (Unsigned)
Office Visit Note  Patient: Nathan [name]             Date of Birth: July 17, 1970           MRN: [id number]             PCP: [facility] Referring: [facility] Visit Date: 12/19/2021   Subjective:  Follow-up (Feeling the same as last visit.)   History of Present Illness: Nathan [name] is a 51 y.o. 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 He has not noticed any problems with the medication but also no improvement in symptoms so far. Still pain worst in the neck and upper back with radiation into back and with headaches. Asthma symptoms doing somewhat worse this year. He had cardiac MRI but is now recommended for cardiac PET scan to evaluate but not scheduled yet.  Previous HPI 11/15/2021 Nathan [name] is a 51 y.o. male here for joint pains with history of pulmonary and cutaneous sacoidosis.  His worst affected area is pretty constant pain in the base of the neck area frequently with radiation up the neck and posterior headaches associated with this.  He has had problems going on for years but seems to be progressively getting somewhat worse.  He does get some pain in extremities elsewhere but by far the most limiting for him is in the base of the neck.  He has tried multiple NSAIDs including meloxicam and Celebrex is also tried muscle relaxant medications none of which were very significantly beneficial.  He is tried physical therapy completing courses and had local injections that did not greatly relieve symptoms.  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  He had most recent neck MRI in 2022 based on findings had discussed surgery options but is looking to avoid this due to concern about complications or long-term needing additional revision. Original diagnosis of sarcoidosis was based on pulmonary imaging finding and  treated with very prolonged prednisone taper.  Initially only had respiratory symptoms with some mild dyspnea and coughing that improved with the steroid treatments.  More recently this year he had an episode of similar symptoms coming back and was treated with a round of steroids from pulmonology clinic in noticed a increase in blood glucose up to 400s.  He is also developed some new skin rashes on the face previously had involvement on extremities but no facial rashes with original symptoms.  He is prescribed topical medication by Dr. Denna Haggard but has not seen any difference so far.     Activities of Daily Living:  Patient reports morning stiffness for 30-60 minutes.   Patient Reports nocturnal pain.  Difficulty dressing/grooming: Denies Difficulty climbing stairs: Denies Difficulty getting out of chair: Reports Difficulty using hands for taps, buttons, cutlery, and/or writing: Reports   Review of Systems  Constitutional:  Positive for fatigue.  HENT:  Negative for mouth sores and mouth dryness.   Eyes:  Positive for dryness.  Respiratory:  Positive for shortness of breath.   Cardiovascular:  Positive for chest pain.  Gastrointestinal:  Positive for diarrhea. Negative for blood in stool and constipation.  Endocrine: Negative for increased urination.  Genitourinary:  Negative for involuntary urination.  Musculoskeletal:  Positive for joint pain, gait problem, joint pain, joint swelling, myalgias, muscle weakness, morning stiffness, muscle tenderness and myalgias.  Skin:  Positive for sensitivity to sunlight. Negative for color change, rash and hair loss.  Allergic/Immunologic: Negative for susceptible to infections.  Neurological:  Positive for headaches. Negative for dizziness.  Hematological:  Negative for swollen glands.  Psychiatric/Behavioral:  Positive for depressed mood and sleep disturbance. The patient is nervous/anxious.     PMFS History:  Patient Active Problem List    Diagnosis Date Noted   High risk medication use 11/15/2021   HLD (hyperlipidemia) 09/13/2021   Asthma 11/10/2020   COVID-19 virus infection 10/21/2019   Shoulder pain, left 10/23/2016   Other chest pain 08/22/2014   Upper airway cough syndrome 07/23/2014   Sarcoidosis 03/06/2011    Past Medical History:  Diagnosis Date   Arthritis    Asthma    'I think I might have asthma"   Chronic back pain    Dyspnea    chronic   Hyperlipidemia    Sarcoidosis     Family History  Problem Relation Age of Onset   Hypertension Mother    Diabetes Mother    Heart attack Father    Diabetes Father    Hypertension Father    Gout Father    Colon polyps Sister    Heart disease Paternal Uncle    Prostate cancer Paternal Uncle    Heart disease Maternal Grandfather    Anemia Daughter    Anemia Daughter    Colon cancer Neg Hx    Esophageal cancer Neg Hx    Rectal cancer Neg Hx    Stomach cancer Neg Hx    Past Surgical History:  Procedure Laterality Date   NO PAST SURGERIES     Social History   Social History Narrative   Not on file   Immunization History  Administered Date(s) Administered   Influenza,inj,Quad PF,6+ Mos 11/10/2020   PFIZER(Purple Top)SARS-COV-2 Vaccination 10/27/2020     Objective: Vital Signs: BP 109/74 (BP Location: Left Arm, Patient Position: Sitting, Cuff Size: Normal)   Pulse 72   Resp 14   Ht 5' 7.5" (1.715 m)   Wt 243 lb (110.2 kg)   BMI 37.50 kg/m    Physical Exam Constitutional:      Appearance: He is obese.  HENT:     Mouth/Throat:     Mouth: Mucous membranes are moist.     Pharynx: Oropharynx is clear.  Eyes:     Conjunctiva/sclera: Conjunctivae normal.  Cardiovascular:     Rate and Rhythm: Normal rate and regular rhythm.  Pulmonary:     Effort: Pulmonary effort is normal.     Breath sounds: Normal breath sounds.  Musculoskeletal:     Right lower leg: No edema.     Left lower leg: No edema.  Skin:    General: Skin is warm and dry.      Findings: Rash present.     Comments: Hyperpigmented skin rashes at medial border of both eyes and on bridge of nose, minimally raised  Neurological:     Mental Status: He is alert.  Psychiatric:        Mood and Affect: Mood normal.      Musculoskeletal Exam:  Shoulders full ROM no tenderness or swelling Elbows full ROM no tenderness or swelling Wrists full ROM no tenderness or swelling Fingers full ROM no tenderness or swelling Tenderness to pressure paraspinal muscles worst around the base of neck, no palpable swelling, no radiation of pain provoked with palpable Knees full ROM no tenderness or swelling   Investigation: No additional findings.  Imaging:  CT Chest Wo Contrast  Result Date: 11/24/2021 CLINICAL DATA:  Follow-up sarcoidosis. Chronic shortness of breath, cough, and chest pain. History of asthma. EXAM: CT CHEST WITHOUT CONTRAST TECHNIQUE: Multidetector CT imaging of the chest was performed following the standard protocol without IV contrast. RADIATION DOSE REDUCTION: This exam was performed according to the departmental dose-optimization program which includes automated exposure control, adjustment of the mA and/or kV according to patient size and/or use of iterative reconstruction technique. COMPARISON:  11/26/2020. FINDINGS: Cardiovascular: The heart is normal in size and there is no pericardial effusion. The aorta and pulmonary trunk are normal in caliber. Mediastinum/Nodes: Multiple enlarged lymph nodes are present in the mediastinum and likely hilar regions bilaterally. Calcified lymph nodes are present at the right hilum. No axillary lymphadenopathy. The thyroid gland, trachea, and esophagus are within normal limits. Lungs/Pleura: Paraseptal emphysematous changes are present in the lungs bilaterally. There is stable strandy opacities or scarring in the lungs bilaterally. A few scattered pulmonary nodules are noted in the right upper lobe measuring up to 4 mm, unchanged.  There is a nodular opacity in the left upper lobe measuring 1.1 x 0.8 cm, slightly decreased in size from the prior exam, axial image 40. There is a stable pleural-based 9 mm nodule in the left lower lobe, axial image 8. No new nodule is identified. Upper Abdomen: Stable prominent lymph nodes are noted in the porta hepatis. Thickening of the medial limb of the left adrenal gland is unchanged. No discrete nodule is identified. No acute abnormality. Musculoskeletal: Mild degenerative changes in the thoracic spine. IMPRESSION: 1. Stable mediastinal and hilar lymphadenopathy with bandlike and nodular opacities in the lungs bilaterally, compatible with history of sarcoidosis. 2. Emphysema. Electronically Signed   By: Brett Fairy M.D.   On: 11/24/2021 02:54    Recent Labs: Lab Results  Component Value Date   WBC 4.3 11/15/2021   HGB 12.6 (L) 11/15/2021   PLT 239 11/15/2021   NA 143 11/15/2021   K 4.3 11/15/2021   CL 107 11/15/2021   CO2 30 11/15/2021   GLUCOSE 113 (H) 11/15/2021   BUN 12 11/15/2021   CREATININE 0.69 11/15/2021   BILITOT 0.8 11/15/2021   ALKPHOS 81 11/08/2021   AST 30 11/15/2021   ALT 28 11/15/2021   PROT 6.9 11/15/2021   ALBUMIN 4.7 11/08/2021   CALCIUM 9.1 11/15/2021   GFRAA >60 09/17/2019    Speciality Comments: No specialty comments available.  Procedures:  No procedures performed Allergies: Tizanidine, Hydrocodone-acetaminophen, Oxycodone-acetaminophen, Percocet [oxycodone-acetaminophen], and Vicodin [hydrocodone-acetaminophen]   Assessment / Plan:     Visit Diagnoses: Sarcoidosis  So far I am not sure whether there has been a significant change in any of his symptoms but is only recently on the methotrexate.  Possible partial flattening of the papular facial rash but definitely still active and hyperpigmented.  No improvement in neck and upper back pains so far.  He is getting planned for cardiac PET scan certainly if this indicates active disease may need more  aggressive treatment options discussed.  Plan to continue the methotrexate 15 mg p.o. weekly and folic acid 1 mg daily at this time.  High risk medication use - Plan: CBC with Differential/Platelet, COMPLETE METABOLIC PANEL WITH GFR  Checking CBC and CMP for methotrexate toxicity monitoring now after first few weeks of treatment.  He has not noticed any significant intolerance or issues taking the medication.  Orders: Orders Placed This Encounter  Procedures   CBC with Differential/Platelet   COMPLETE METABOLIC  PANEL WITH GFR   No orders of the defined types were placed in this encounter.    Follow-Up Instructions: No follow-ups on file.   Collier Salina, MD  Note - This record has been created using [employer].  Chart creation errors have been sought, but may not always  have been located. Such creation errors do not reflect on  the standard of medical care.

## 2021-12-07 NOTE — Telephone Encounter (Signed)
Pharmacy please run test claims on laba/ lama inhalers at Dr. Lamonte Sakai request

## 2021-12-07 NOTE — Assessment & Plan Note (Signed)
Associated with his sarcoid.  I try to get him back on Symbicort but he was unable to obtain it.  There is no financial assistance for this.  I think it would be reasonable to do a trial of Stiolto to see if he gets benefit.  If so we will try to continue it.  We may have to have him on an alternative.  We will ask clinical pharmacist to do a test case for LABA/LAMA.  We will try starting Stiolto 2 puffs once daily.  If you benefit and if we can get good insurance coverage then we will try to continue this medication.  If this medication is not covered we will look for similar alternative Keep your albuterol available to use after 4 hours if needed for shortness of breath, chest tightness, wheezing.

## 2021-12-07 NOTE — Telephone Encounter (Signed)
Error

## 2021-12-07 NOTE — Progress Notes (Signed)
Subjective:    Patient ID: Nathan Nathan [name], male    DOB: 04-23-1970, 51 y.o.   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  He has been on Symbicort in the past but has had difficulty staying on this medication due to cost and insurance coverage.  He was seen in late May with skin changes, pruritic rash around his right eye and right nostril that seem to occur after cutting the grass.  He was started on Xyzal and treated with prednisone 20 mg daily for 2 weeks. Feels better, rash is better.  Remains on Protonix, fluticasone nasal spray twice a day, Atrovent nasal spray as needed, Zyrtec.  He is trying to get back on assistance program for symbicort.  He has been having exertional SOB w hills, heavier work.   ROV 12/07/21 --51 year old man with history of former tobacco use and sarcoidosis with pulmonary and cutaneous manifestations.  He has severe obstructive lung disease.  Also with GERD, chronic rhinitis.  Last seen in July. He was evaluated by cards and cardiac MRI questioned whether there could be cardiac sarcoid - he is planning for cardiac PET at [facility], not yet arranged.  He is dealing with a lot of joint pain. No new rash or breathing trouble. He was just started on MTX by Dr Nathan Nathan [name] w Rheumatology.  We had attempted to restart his Symbicort last time, but he was unable to get get assistance so he is not on it. He has fairly good functional capacity, but can be limited by dyspnea. Minimal cough or wheeze. Uses albuterol  afew times a week.   CT scan of the chest performed 11/22/2021 reviewed by me shows stable mediastinal and hilar adenopathy, bilateral bandlike opacities and some scattered pulmonary nodules are all stable.  1.1 x 0.8 cm left upper lobe nodular opacity is smaller compared with prior.  No evidence of active  sarcoidosis   Review of Systems As per HPI     Objective:   Physical Exam Vitals:   12/07/21 0828  BP: 124/80  Pulse: 76  Temp: 97.9 F (36.6 C)  SpO2: 96%  Weight: 241 lb 12.8 oz (109.7 kg)  Height: '5\' 7"'$  (1.702 m)   Gen: Pleasant, well-nourished, in no distress,  normal affect  ENT: No lesions, subtle dark resolved rash around his Nathan [name] and medial aspect of his eyes  Neck: No JVD, no stridor  Lungs: No use of accessory muscles, no crackles or wheezing on normal respiration, no wheeze on forced expiration  Cardiovascular: RRR, heart sounds normal, no murmur or gallops, no peripheral edema  Musculoskeletal: No deformities, no cyanosis or clubbing  Neuro: alert, awake, non focal  Skin: Old resolved dark patchy rash right upper extremity      Assessment & Plan:  Sarcoidosis Reviewed CT chest with him today, stable, reassuring.  He does still have a faint rash and is under evaluation for possible cardiac involvement.  Cardiac MRI was equivocal but a cardiac PET has been planned at [facility].  He does have joint pain, question whether this is related to his sarcoid.  He has been started on methotrexate and is following with Dr. Benjamine [name] with rheumatology.  We reviewed your CT scan of the chest today.  This is stable compared with your priors. Continue methotrexate and follow-up with Dr. Benjamine [name] as planned Agree with cardiac PET at [facility] as  planned Follow with Dr Nathan Nathan [name] in 6 months or sooner if you have any problems  Asthma Associated with his sarcoid.  I try to get him back on Symbicort but he was unable to obtain it.  There is no financial assistance for this.  I think it would be reasonable to do a trial of Stiolto to see if he gets benefit.  If so we will try to continue it.  We may have to have him on an alternative.  We will ask clinical pharmacist to do a test case for LABA/LAMA.  We will try starting Stiolto 2 puffs once daily.  If you benefit and if we can get good insurance  coverage then we will try to continue this medication.  If this medication is not covered we will look for similar alternative Keep your albuterol available to use after 4 hours if needed for shortness of breath, chest tightness, wheezing.  Nathan Apo, MD, PhD 12/07/2021, 11:56 AM [facility] [phone] or if no answer [phone]

## 2021-12-08 ENCOUNTER — Other Ambulatory Visit (HOSPITAL_COMMUNITY): Payer: Self-pay

## 2021-12-08 NOTE — Telephone Encounter (Signed)
Test claims for LABA+LAMA return the following:   Anoro $15.00 with a 30 ay maximum fill Stiolto non-formulary Bevespi non-formulary

## 2021-12-09 NOTE — Telephone Encounter (Signed)
Thank you.  I started him on Stiolto, but we should probably switch him over to Anoro if he is going to stay on therpy

## 2021-12-16 NOTE — Telephone Encounter (Signed)
Faxed orders to [facility].  Will await response for patient appointment.

## 2021-12-19 ENCOUNTER — Encounter: Payer: Self-pay | Admitting: Internal Medicine

## 2021-12-19 ENCOUNTER — Ambulatory Visit: Payer: Commercial Managed Care - HMO | Attending: Internal Medicine | Admitting: Internal Medicine

## 2021-12-19 VITALS — BP 109/74 | HR 72 | Resp 14 | Ht 67.5 in | Wt 243.0 lb

## 2021-12-19 DIAGNOSIS — Z79899 Other long term (current) drug therapy: Secondary | ICD-10-CM | POA: Diagnosis not present

## 2021-12-19 DIAGNOSIS — D869 Sarcoidosis, unspecified: Secondary | ICD-10-CM | POA: Diagnosis not present

## 2021-12-20 ENCOUNTER — Telehealth: Payer: Self-pay | Admitting: Internal Medicine

## 2021-12-20 LAB — CBC WITH DIFFERENTIAL/PLATELET
Absolute Monocytes: 694 cells/uL (ref 200–950)
Basophils Absolute: 21 cells/uL (ref 0–200)
Basophils Relative: 0.4 %
Eosinophils Absolute: 307 cells/uL (ref 15–500)
Eosinophils Relative: 5.8 %
HCT: 40.3 % (ref 38.5–50.0)
Hemoglobin: 13.3 g/dL (ref 13.2–17.1)
Lymphs Abs: 1330 cells/uL (ref 850–3900)
MCH: 28.2 pg (ref 27.0–33.0)
MCHC: 33 g/dL (ref 32.0–36.0)
MCV: 85.4 fL (ref 80.0–100.0)
MPV: 9.8 fL (ref 7.5–12.5)
Monocytes Relative: 13.1 %
Neutro Abs: 2947 cells/uL (ref 1500–7800)
Neutrophils Relative %: 55.6 %
Platelets: 270 10*3/uL (ref 140–400)
RBC: 4.72 10*6/uL (ref 4.20–5.80)
RDW: 13.9 % (ref 11.0–15.0)
Total Lymphocyte: 25.1 %
WBC: 5.3 10*3/uL (ref 3.8–10.8)

## 2021-12-20 LAB — COMPLETE METABOLIC PANEL WITH GFR
AG Ratio: 1.6 (calc) (ref 1.0–2.5)
ALT: 20 U/L (ref 9–46)
AST: 22 U/L (ref 10–40)
Albumin: 4.3 g/dL (ref 3.6–5.1)
Alkaline phosphatase (APISO): 79 U/L (ref 36–130)
BUN: 8 mg/dL (ref 7–25)
CO2: 33 mmol/L — ABNORMAL HIGH (ref 20–32)
Calcium: 8.9 mg/dL (ref 8.6–10.3)
Chloride: 103 mmol/L (ref 98–110)
Creat: 0.62 mg/dL (ref 0.60–1.29)
Globulin: 2.7 g/dL (calc) (ref 1.9–3.7)
Glucose, Bld: 87 mg/dL (ref 65–99)
Potassium: 3.9 mmol/L (ref 3.5–5.3)
Sodium: 142 mmol/L (ref 135–146)
Total Bilirubin: 0.8 mg/dL (ref 0.2–1.2)
Total Protein: 7 g/dL (ref 6.1–8.1)
eGFR: 117 mL/min/{1.73_m2} (ref >=60)

## 2021-12-20 MED ORDER — METHOTREXATE SODIUM 2.5 MG PO TABS: 15.0000 mg | ORAL_TABLET | ORAL | 2 refills | Status: DC

## 2021-12-20 NOTE — Progress Notes (Signed)
Lab results look fine there is no change in his blood count or with metabolic panel since starting the methotrexate so it is definitely safe to continue. New Rx sent to pharmacy as planned.

## 2021-12-20 NOTE — Telephone Encounter (Signed)
Called for updated from [place] dept.  They received the order but the appointment has not been scheduled yet. They will call or fax back information once it is scheduled.  I called the patient and made him aware of this update.

## 2021-12-20 NOTE — Addendum Note (Signed)
Addended by: Collier Salina on: 12/20/2021 08:23 AM   Modules accepted: Orders

## 2021-12-20 NOTE — Telephone Encounter (Signed)
Patient called the office stating he has a new number and would like to speak with someone regarding his lab results. [phone]

## 2021-12-20 NOTE — Telephone Encounter (Signed)
See telephone encounter dated 11/25/21.

## 2021-12-20 NOTE — Telephone Encounter (Signed)
Patient calling about the referral that was suppose to be send to [facility] for him. Please advise

## 2021-12-23 ENCOUNTER — Telehealth: Payer: Self-pay | Admitting: Emergency Medicine

## 2021-12-23 NOTE — Telephone Encounter (Signed)
ATC LVMTCB X1  

## 2021-12-26 NOTE — Telephone Encounter (Signed)
See telephone encounter from 12/26/21

## 2021-12-26 NOTE — Telephone Encounter (Signed)
Spoke with pt who states he is ok with switching to the Anoro and verified pharmacy as [facility] in [place] station. Pt states he is not currently using any maintenance  inhalers as his sample of Stiolto was messed up and not delivering medication correctly. Dr. Lamonte Sakai would like [place] to switch pt to Anoro?

## 2021-12-28 MED ORDER — ANORO ELLIPTA 62.5-25 MCG/ACT IN AEPB: 1.0000 | INHALATION_SPRAY | Freq: Every day | RESPIRATORY_TRACT | 0 refills | Status: DC

## 2021-12-28 NOTE — Telephone Encounter (Signed)
Yes please do. Then he needs to let us know how he responds to the [facility]

## 2021-12-28 NOTE — Telephone Encounter (Signed)
Called and spoke with patient. Patient verbalized understanding. Patient verified pharmacy. Rx has been sent to patients pharmacy.   Nothing further needed.

## 2021-12-29 ENCOUNTER — Telehealth: Payer: Self-pay | Admitting: Internal Medicine

## 2021-12-29 NOTE — Telephone Encounter (Signed)
Left a message to call back.  RN called [facility] PET scheduling pt is scheduled for 02/10/22 at 10:45 am.  Was told a supervisor will call to go over prep instructions 2 days prior to testing.

## 2021-12-29 NOTE — Telephone Encounter (Signed)
Advised pt of PET scan appointment on 02/10/22 at 10:45 and to expect a call 2 days before for instructions.  Pt had no further questions or concerns.

## 2021-12-29 NOTE — Telephone Encounter (Signed)
Pt calling to f/u on PET CT that is to be done at [facility]. Pt states that he has not heard anything as far as scheduling. Please advise

## 2021-12-29 NOTE — Telephone Encounter (Signed)
Pt is retuning call. Transferred to [facility], Therapist, sports.

## 2021-12-30 NOTE — Telephone Encounter (Signed)
Per telephone encounter 12/29/21, pt has been scheduled for cardiac PET sarcoid on 02/10/22 and he has been informed.

## 2022-01-27 ENCOUNTER — Telehealth: Payer: Self-pay | Admitting: Internal Medicine

## 2022-01-27 NOTE — Telephone Encounter (Signed)
Sent message to precert (A. Hepler) for assistance.

## 2022-01-27 NOTE — Telephone Encounter (Signed)
Caller stated a new request for prior authorization for [id number] would need to be completed for this patient.

## 2022-01-30 NOTE — Telephone Encounter (Signed)
Clarise Cruz from [place] is calling to get update on the prior auth.

## 2022-01-31 ENCOUNTER — Other Ambulatory Visit: Payer: Self-pay | Admitting: Emergency Medicine

## 2022-01-31 ENCOUNTER — Telehealth: Payer: Self-pay | Admitting: Internal Medicine

## 2022-01-31 NOTE — Telephone Encounter (Signed)
Calling in regards to Authorization Code. Please advise

## 2022-01-31 NOTE — Telephone Encounter (Signed)
Attempted to call back to to [place] at [facility].  Mailbox is full.       Per Pre cert:  Ciro Backer  to Me  C[facility] Div [facility] Pre Cert/Auth  [facility]   25/63/89 11:44 AM Calling [facility] to update auth status. Josem Kaufmann is approved. [id number] Exp: 07/26/2022.

## 2022-02-02 NOTE — Telephone Encounter (Signed)
Called x2 to give authorization number Sandra's mailbox is full.

## 2022-02-02 NOTE — Telephone Encounter (Signed)
Katharine Look with [facility] is returning call.

## 2022-02-02 NOTE — Telephone Encounter (Signed)
Called the main number for [place] [phone].  Was transferred to [employer] Counselor left a voice mail with pt information and authorization code [id number] Exp: 07/26/2022  Advised to call our office with any questions or concerns.

## 2022-02-14 ENCOUNTER — Telehealth: Payer: Self-pay | Admitting: Internal Medicine

## 2022-02-14 NOTE — Telephone Encounter (Signed)
Spoke with patient, scan done at [facility], MD has not reviewed yet.

## 2022-02-14 NOTE — Telephone Encounter (Signed)
Patient calling to see if his PET scan results have been received.

## 2022-02-15 ENCOUNTER — Telehealth: Payer: Self-pay | Admitting: Emergency Medicine

## 2022-02-15 NOTE — Telephone Encounter (Signed)
Called and left voicemail for patient to call office back in regards to Anoro inhaler.

## 2022-02-15 NOTE — Telephone Encounter (Signed)
Would recommend that we try Breztri 2 puffs twice a day if he is okay making this change.

## 2022-02-15 NOTE — Telephone Encounter (Signed)
PT ret call from Triage. Please try again @ [phone]

## 2022-02-15 NOTE — Telephone Encounter (Signed)
Called and spoke with pt who states when he uses his Anoro inhaler, he is unsure if it is working for him as he says after he uses it, he still feels like he is more SOB.  Pt wants to know what might be recommended for him due to this if there is another inhaler that he might be able to be prescribed. Pt said he was on Symbicort prior to the Anoro which worked well for him but does not know if he will be able to get it again.  Dr. Lamonte Sakai, please advise on this for pt.

## 2022-02-16 NOTE — Telephone Encounter (Signed)
Called and left voicemail for patient to call office back to go over recommendations from Dr Lamonte Sakai on [place].

## 2022-02-17 MED ORDER — BREZTRI AEROSPHERE 160-9-4.8 MCG/ACT IN AERO: 2.0000 | INHALATION_SPRAY | Freq: Two times a day (BID) | RESPIRATORY_TRACT | 1 refills | Status: DC

## 2022-02-17 NOTE — Telephone Encounter (Signed)
Called and spoke with patient. Advised of Dr. Sudie Bailey recommendation. Sent in prescription of Breztri to pharmacy. Nothing further needed.

## 2022-02-17 NOTE — Telephone Encounter (Signed)
Patient is returning phone call. Patient phone number is [phone].

## 2022-02-17 NOTE — Telephone Encounter (Signed)
Patient is following up. Again, he would like to know if PET scan results have been reviewed. Please advise.

## 2022-02-17 NOTE — Telephone Encounter (Signed)
Left message to call back  

## 2022-02-17 NOTE — Telephone Encounter (Signed)
Cardiac PET sarcoid completed at [facility] 02/10/22.  Result available in [place].  Will route to MD for review.  Pt has appointment with Dr. Ali Lowe on 02/27/22.

## 2022-02-20 ENCOUNTER — Emergency Department (HOSPITAL_COMMUNITY)
Admission: EM | Admit: 2022-02-20 | Discharge: 2022-02-20 | Disposition: A | Discharge status: Home / Self Care | Payer: Commercial Managed Care - HMO | Attending: Emergency Medicine | Admitting: Emergency Medicine

## 2022-02-20 ENCOUNTER — Other Ambulatory Visit: Payer: Self-pay

## 2022-02-20 ENCOUNTER — Telehealth: Payer: Self-pay | Admitting: Internal Medicine

## 2022-02-20 ENCOUNTER — Encounter (HOSPITAL_COMMUNITY): Payer: Self-pay

## 2022-02-20 DIAGNOSIS — K0889 Other specified disorders of teeth and supporting structures: Secondary | ICD-10-CM | POA: Diagnosis present

## 2022-02-20 DIAGNOSIS — K047 Periapical abscess without sinus: Secondary | ICD-10-CM | POA: Insufficient documentation

## 2022-02-20 MED ORDER — AMOXICILLIN-POT CLAVULANATE 875-125 MG PO TABS: 1.0000 | ORAL_TABLET | Freq: Two times a day (BID) | ORAL | 0 refills | Status: DC

## 2022-02-20 NOTE — Telephone Encounter (Signed)
Left message for patient or his spouse to call back.

## 2022-02-20 NOTE — ED Triage Notes (Signed)
Patient c/o left lower dental pain and left facial swelling x 2 days.

## 2022-02-20 NOTE — Telephone Encounter (Signed)
Patient's wife called back.  Results provided.  Reminded of appointment next Monday with Dr. Ali Lowe.

## 2022-02-20 NOTE — Discharge Instructions (Addendum)
Antibiotics as prescribed and complete the full course. See a dentist as soon as possible, see referral and resource list.

## 2022-02-20 NOTE — ED Provider Notes (Signed)
[facility] DEPT Provider Note   CSN: [id number] Arrival date & time: 02/20/22  1255     History  Chief Complaint  Patient presents with   Facial Swelling   Dental Pain    Nathan [name] is a 52 y.o. male.  52 year old male with left lower jaw pain and swelling, similar to prior, history of dental problems. Has been applying topical gel without relief. Swelling started yesterday, pain radiates up to ear. No trauma, fever, drainage.        Home Medications Prior to Admission medications   Medication Sig Start Date End Date Taking? Authorizing Provider  amoxicillin-clavulanate (AUGMENTIN) 875-125 MG tablet Take 1 tablet by mouth every 12 (twelve) hours. 02/20/22  Yes Tacy Learn, PA-C  acetaminophen (TYLENOL) 500 MG tablet Take 1 tablet (500 mg total) by mouth every 6 (six) hours as needed. 04/01/21   Varney Biles, MD  albuterol (VENTOLIN HFA) 108 (90 Base) MCG/ACT inhaler Inhale 2 puffs into the lungs every 6 (six) hours as needed for wheezing or shortness of breath. 02/16/21   Martyn Ehrich, NP  Jearl Klinefelter ELLIPTA 62.5-25 MCG/ACT AEPB INHALE ONE PUFF BY MOUTH ONE TIME DAILY 01/31/22   Collene Gobble, MD  aspirin EC 81 MG tablet Take 1 tablet (81 mg total) by mouth daily. Swallow whole. 02/28/21   Early Osmond, MD  atorvastatin (LIPITOR) 10 MG tablet Take 1 tablet (10 mg total) by mouth daily. 09/13/21   Early Osmond, MD  Budeson-Glycopyrrol-Formoterol (BREZTRI AEROSPHERE) 160-9-4.8 MCG/ACT AERO Inhale 2 puffs into the lungs in the morning and at bedtime. 02/17/22   Collene Gobble, MD  budesonide-formoterol (SYMBICORT) 160-4.5 MCG/ACT inhaler Inhale 2 puffs into the lungs 2 (two) times daily.    [provider]  celecoxib (CELEBREX) 100 MG capsule Take 100 mg by mouth daily. 03/18/21   [provider]  ezetimibe (ZETIA) 10 MG tablet Take 10 mg by mouth daily. 05/23/21   [provider]  fluticasone (FLONASE) 50  MCG/ACT nasal spray Place 1 spray into both nostrils 2 (two) times daily. 12/26/19   Fulp, Cammie, MD  folic acid (FOLVITE) 1 MG tablet Take 1 tablet (1 mg total) by mouth daily. 11/21/21   Rice, Resa Miner, MD  gabapentin (NEURONTIN) 100 MG capsule Take 100 mg by mouth 3 (three) times daily. 08/24/21   [provider]  ipratropium (ATROVENT) 0.06 % nasal spray Place 2 sprays into both nostrils 4 (four) times daily as needed for rhinitis. 12/26/19   Fulp, Cammie, MD  ipratropium-albuterol (DUONEB) 0.5-2.5 (3) MG/3ML SOLN     [provider]  levocetirizine (XYZAL) 5 MG tablet Take 5 mg by mouth every evening.    [provider]  metFORMIN (GLUCOPHAGE-XR) 500 MG 24 hr tablet Take 500 mg by mouth 2 (two) times daily. 08/24/21   [provider]  methotrexate (RHEUMATREX) 2.5 MG tablet Take 6 tablets (15 mg total) by mouth once a week. Caution:Chemotherapy. Protect from light. 12/20/21   Collier Salina, MD  pantoprazole (PROTONIX) 40 MG tablet Take 1 tablet (40 mg total) by mouth daily. Patient not taking: Reported on 12/19/2021 04/09/20   Sherwood Gambler, MD  tacrolimus (PROTOPIC) 0.1 % ointment Apply topically 2 (two) times daily. 09/07/21   Lavonna Monarch, MD  Tiotropium Bromide-Olodaterol (STIOLTO RESPIMAT) 2.5-2.5 MCG/ACT AERS Inhale 2 puffs into the lungs daily. 12/07/21   Collene Gobble, MD  triamcinolone ointment (KENALOG) 0.1 % Apply 1 Application topically  2 (two) times daily.    [provider]      Allergies    Tizanidine, Hydrocodone-acetaminophen, Oxycodone-acetaminophen, Percocet [oxycodone-acetaminophen], and Vicodin [hydrocodone-acetaminophen]    Review of Systems   Review of Systems Negative except as per HPI Physical Exam Updated Vital Signs BP (!) 149/102 (BP Location: Right Arm)   Pulse 90   Temp 98.4 F (36.9 C) (Oral)   Resp 16   Ht '5\' 8"'$  (1.727 m)   Wt 111.6 kg   SpO2 97%   BMI 37.40 kg/m  Physical Exam Vitals and  nursing note reviewed.  Constitutional:      General: He is not in acute distress.    Appearance: He is well-developed. He is not diaphoretic.  HENT:     Head: Normocephalic and atraumatic.     Jaw: No trismus.     Mouth/Throat:     Comments: Multiple missing teeth, significant left lower tooth decay to the gumline. Pulmonary:     Effort: Pulmonary effort is normal.  Neurological:     Mental Status: He is alert and oriented to person, place, and time.  Psychiatric:        Behavior: Behavior normal.     ED Results / Procedures / Treatments   Labs (all labs ordered are listed, but only abnormal results are displayed) Labs Reviewed - No data to display  EKG None  Radiology No results found.  Procedures Procedures    Medications Ordered in ED Medications - No data to display  ED Course/ Medical Decision Making/ A&P                           Medical Decision Making  52 year old male presents with recurrent left lower dental infection, swelling onset yesterday.  No trauma, fever, drainage.  No trismus on exam.  Does have significant tooth decay with mild swelling of the gingiva.  No submandibular side lingual or swelling, no drainage.  Patient is discharged with antibiotics, encouraged to follow-up with his dentist, provided with referral and resource guide as well.  Patient is on methotrexate, 1 tablet weekly, followed by rheumatology.        Final Clinical Impression(s) / ED Diagnoses Final diagnoses:  Dental abscess    Rx / DC Orders ED Discharge Orders          Ordered    amoxicillin-clavulanate (AUGMENTIN) 875-125 MG tablet  Every 12 hours        02/20/22 1440              Roque Lias 02/20/22 1443    Valarie Merino, MD 02/20/22 1501

## 2022-02-20 NOTE — Telephone Encounter (Signed)
Patient returned call for his Cardiac PET scan results.  He said he was getting ready to go into the emergency room.  If the results could be given to his wife, she can be reached at [phone].

## 2022-02-21 NOTE — Progress Notes (Signed)
Cardiology Office Note:    Date:  02/27/2022   ID:  Nathan [name], DOB 09-05-1970, MRN [id number]  PCP:  [facility]   [facility] Providers Cardiologist:  Lenna Sciara, MD Referring MD: [facility]   Chief Complaint/Reason for Referral: Chest tightness and dyspnea  ASSESSMENT:    Precordial pain  Sarcoidosis  Aortic atherosclerosis ([place]) - Plan: EKG 12-Lead, Lipid panel, Hepatic function panel  Elevated lipoprotein(a) - Plan: EKG 12-Lead, Lipid panel, Hepatic function panel  Dyspnea on exertion    PLAN:    In order of problems listed above: 1.  Chest pain:  Reassuring coronary CTA, CMR, and PET scan. 2.  Sarcoidosis: No cardiac sarcoid per CMR and PET scans. 3.  Aortic atherosclerosis: Continue aspirin, Zetia, and strict blood pressure control 4.  Hyperlipidemia:  LDL in December was 69; goal is less than 55 given elevated Lp(a).  Continue Zetia.  Increase atorvastatin to '20mg'$  and check lipid panel and LFTs in 2 months. 5.  Dyspnea: I think this is mostly pulmonary in nature.  However we will trial him on low-dose diuretic to see if this helps.  Will prescribe Lasix 10 mg daily.  The patient will call us back if this has been helpful we will prescribe this then long-term.     Dispo:  Return in about 9 months (around 11/28/2022).     Medication Adjustments/Labs and Tests Ordered: Current medicines are reviewed at length with the patient today.  Concerns regarding medicines are outlined above.   Tests Ordered: Orders Placed This Encounter  Procedures   Lipid panel   Hepatic function panel   EKG 12-Lead    Medication Changes: Meds ordered this encounter  Medications   atorvastatin (LIPITOR) 20 MG tablet    Sig: Take 1 tablet (20 mg total) by mouth daily.    Dispense:  90 tablet    Refill:  3    Dose increase   furosemide (LASIX) 20 MG tablet    Sig: Take 0.5 tablets (10 mg total) by mouth daily.    Dispense:   5 tablet    Refill:  0    History of Present Illness:    FOCUSED CARDIOVASCULAR PROBLEM LIST:   1.  Pulmonary sarcoidosis 2.  Asthma and emphysema 3.  Hyperlipidemia; intolerant of atorvastatin; Lp(a) 98; goal LDL < 55 4.  Aortic atherosclerosis on CT 2022  January 2023: Patient was seen for initial consultation regarding chest pain.  He is referred for coronary CTA and echocardiogram.  He was started on aspirin 81 mg and his Crestor was continued due to the presence of aortic atherosclerosis on a previous CT scan.   July 2023: In the interim the patient had an echocardiogram which was reassuring.  His coronary CTA was not performed.  He presented in February and in March with myalgias of his lower extremities.  The patient still gets occasional fleeting chest pains.  He is not sure if this relates to his back pain that he has been dealing with.  He denies any shortness of breath, palpitations, presyncope, or syncope.  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  He is required no emergency room visits or hospitalizations.  Because of his intolerance of atorvastatin he was started on Zetia about a month and a half ago.  Plan:  Refer for CMR for sarcoid; coronary CTA for chest pain; check lipid panel, Lp(a).  Today:  The patient had a CMR and  PET scan demonstrating no active cardiac sarcoid.  A coronary CTA was also reassuring.  He is seeing pulmonary rheumatology regarding his pulmonary sarcoidosis.  He still gets short of breath at times and his inhalers are being managed actively.  Sometimes he develops orthopnea but this is not a routine issue.  He denies any significant swelling in his legs.  He gets short of breath at times.  He fortunately has not required any emergency room visits or hospitalizations.  He has had no chest pain.  He denies any presyncope or syncope.  Due to his breathing issues he is out of work from the Designer, industrial/product at [employer].  He is  applying for disability.  He is hopeful he can get back to work however once his breathing feels better.          Current Medications: Current Meds  Medication Sig   acetaminophen (TYLENOL) 500 MG tablet Take 1 tablet (500 mg total) by mouth every 6 (six) hours as needed.   albuterol (VENTOLIN HFA) 108 (90 Base) MCG/ACT inhaler Inhale 2 puffs into the lungs every 6 (six) hours as needed for wheezing or shortness of breath.   amoxicillin-clavulanate (AUGMENTIN) 875-125 MG tablet Take 1 tablet by mouth every 12 (twelve) hours.   ANORO ELLIPTA 62.5-25 MCG/ACT AEPB INHALE ONE PUFF BY MOUTH ONE TIME DAILY   aspirin EC 81 MG tablet Take 1 tablet (81 mg total) by mouth daily. Swallow whole.   atorvastatin (LIPITOR) 20 MG tablet Take 1 tablet (20 mg total) by mouth daily.   Budeson-Glycopyrrol-Formoterol (BREZTRI AEROSPHERE) 160-9-4.8 MCG/ACT AERO Inhale 2 puffs into the lungs in the morning and at bedtime.   budesonide-formoterol (SYMBICORT) 160-4.5 MCG/ACT inhaler Inhale 2 puffs into the lungs 2 (two) times daily.   celecoxib (CELEBREX) 100 MG capsule Take 100 mg by mouth daily.   ezetimibe (ZETIA) 10 MG tablet Take 10 mg by mouth daily.   fluticasone (FLONASE) 50 MCG/ACT nasal spray Place 1 spray into both nostrils 2 (two) times daily.   folic acid (FOLVITE) 1 MG tablet Take 1 tablet (1 mg total) by mouth daily.   furosemide (LASIX) 20 MG tablet Take 0.5 tablets (10 mg total) by mouth daily.   gabapentin (NEURONTIN) 100 MG capsule Take 100 mg by mouth 3 (three) times daily.   ipratropium (ATROVENT) 0.06 % nasal spray Place 2 sprays into both nostrils 4 (four) times daily as needed for rhinitis.   ipratropium-albuterol (DUONEB) 0.5-2.5 (3) MG/3ML SOLN    levocetirizine (XYZAL) 5 MG tablet Take 5 mg by mouth every evening.   metFORMIN (GLUCOPHAGE-XR) 500 MG 24 hr tablet Take 500 mg by mouth 2 (two) times daily.   methotrexate (RHEUMATREX) 2.5 MG tablet Take 6 tablets (15 mg total) by mouth once a  week. Caution:Chemotherapy. Protect from light.   pantoprazole (PROTONIX) 40 MG tablet Take 1 tablet (40 mg total) by mouth daily.   tacrolimus (PROTOPIC) 0.1 % ointment Apply topically 2 (two) times daily.   Tiotropium Bromide-Olodaterol (STIOLTO RESPIMAT) 2.5-2.5 MCG/ACT AERS Inhale 2 puffs into the lungs daily.   triamcinolone ointment (KENALOG) 0.1 % Apply 1 Application topically 2 (two) times daily.   [DISCONTINUED] atorvastatin (LIPITOR) 10 MG tablet Take 1 tablet (10 mg total) by mouth daily.     Allergies:    Tizanidine, Hydrocodone-acetaminophen, Oxycodone-acetaminophen, Percocet [oxycodone-acetaminophen], and Vicodin [hydrocodone-acetaminophen]   Social History:   Social History   Tobacco Use   Smoking status: Former    Packs/day: 1.00  Years: 20.00    Total pack years: 20.00    Types: Cigarettes    Quit date: 01/13/2011    Years since quitting: 11.1   Smokeless tobacco: Never  Vaping Use   Vaping Use: Never used  Substance Use Topics   Alcohol use: No    Alcohol/week: 0.0 standard drinks of alcohol   Drug use: No     Family Hx: Family History  Problem Relation Age of Onset   Hypertension Mother    Diabetes Mother    Heart attack Father    Diabetes Father    Hypertension Father    Gout Father    Colon polyps Sister    Heart disease Paternal Uncle    Prostate cancer Paternal Uncle    Heart disease Maternal Grandfather    Anemia Daughter    Anemia Daughter    Colon cancer Neg Hx    Esophageal cancer Neg Hx    Rectal cancer Neg Hx    Stomach cancer Neg Hx      Review of Systems:   Please see the history of present illness.    All other systems reviewed and are negative.     EKGs/Labs/Other Test Reviewed:    EKG:  EKG done today that I personally reviewed demonstrates sinus rhythm; prior EKG: Sinus rhythm  Prior CV studies:  PET ([facility]) 2023: There is no abnormal metabolism to indicate the presence of an active inflammatory process in the left  ventricular myocardium. There is no abnormal metabolism in the right ventricle. There is no evidence to suggest active cardiac sarcoidosis.   CMR 2023: 1. Small area of basal lateral subendocardial LGE. Given known extracardiac sarcoid, cardiac sarcoid is on differential though tends to present with multifocal LGE. Prior myocarditis also on differential. Recommend cardiac PET to evaluate for cardiac sarcoid 2.  Normal LV size, wall thickness, and systolic function (EF 56%) 3.  Normal RV size and systolic function (EF 43%)  Coronary CTA 2023: 1. Coronary calcium score of 0. This was 0 percentile for age and sex matched control. 2. Normal coronary origin with right dominance. 3. No evidence of CAD. CAD-RADS 0. No evidence of CAD (0%). Consider non-atherosclerotic causes of chest pain.  TTE 2023: Ejection fraction of 55 to 60% without left ventricular hypertrophy and no significant valvular abnormalities.   Imaging studies that I have independently reviewed today:  CT scan 2022: Emphysema, sarcoid, and aortic atherosclerosis   Recent Labs: 04/01/2021: Magnesium 1.9 12/19/2021: ALT 20; BUN 8; Creat 0.62; Hemoglobin 13.3; Platelets 270; Potassium 3.9; Sodium 142   Recent Lipid Panel Lab Results  Component Value Date/Time   CHOL 126 11/08/2021 12:56 PM   TRIG 73 11/08/2021 12:56 PM   HDL 37 (L) 11/08/2021 12:56 PM   LDLCALC 74 11/08/2021 12:56 PM    Risk Assessment/Calculations:          Physical Exam:    VS:  BP 126/78   Pulse 90   Ht '5\' 8"'$  (1.727 m)   Wt 240 lb 6.4 oz (109 kg)   SpO2 98%   BMI 36.55 kg/m    Wt Readings from Last 3 Encounters:  02/27/22 240 lb 6.4 oz (109 kg)  02/20/22 246 lb (111.6 kg)  12/19/21 243 lb (110.2 kg)    GENERAL:  No apparent distress, AOx3 HEENT:  No carotid bruits, +2 carotid impulses, no scleral icterus CAR: RRR no murmurs, gallops, rubs, or thrills RES:  Clear to auscultation bilaterally ABD:  Soft, nontender, nondistended,  positive bowel sounds x 4 VASC:  +2 radial pulses, +2 carotid pulses, palpable pedal pulses NEURO:  CN 2-12 grossly intact; motor and sensory grossly intact PSYCH:  No active depression or anxiety EXT:  No edema, ecchymosis, or cyanosis  Signed, Early Osmond, MD  02/27/2022 10:14 AM    [facility] [place], [place], [place]  [address] Phone: [phone]; Fax: [phone]   Note:  This document was prepared using Dragon voice recognition software and may include unintentional dictation errors.

## 2022-02-24 NOTE — Telephone Encounter (Signed)
Pt called the office stating that he found out the [facility] inhaler was not covered by his insurance and that it was going to cost him around $600. Pt wants to know if there is an alternative inhaler that is preferred with his insurance. Routing to prior auth team for review prior to routing to Dr. Lamonte Sakai. Please advise.

## 2022-02-27 ENCOUNTER — Encounter: Payer: Self-pay | Admitting: Internal Medicine

## 2022-02-27 ENCOUNTER — Ambulatory Visit: Payer: Commercial Managed Care - HMO | Attending: Internal Medicine | Admitting: Internal Medicine

## 2022-02-27 VITALS — BP 126/78 | HR 90 | Ht 68.0 in | Wt 240.4 lb

## 2022-02-27 DIAGNOSIS — D869 Sarcoidosis, unspecified: Secondary | ICD-10-CM

## 2022-02-27 DIAGNOSIS — I700 Atherosclerosis of aorta: Secondary | ICD-10-CM

## 2022-02-27 DIAGNOSIS — R072 Precordial pain: Secondary | ICD-10-CM

## 2022-02-27 DIAGNOSIS — R0609 Other forms of dyspnea: Secondary | ICD-10-CM

## 2022-02-27 DIAGNOSIS — E7841 Elevated Lipoprotein(a): Secondary | ICD-10-CM | POA: Diagnosis not present

## 2022-02-27 MED ORDER — FUROSEMIDE 20 MG PO TABS: 10.0000 mg | ORAL_TABLET | Freq: Every day | ORAL | 0 refills | Status: DC

## 2022-02-27 MED ORDER — ATORVASTATIN CALCIUM 20 MG PO TABS: 20.0000 mg | ORAL_TABLET | Freq: Every day | ORAL | 3 refills | Status: DC

## 2022-02-27 NOTE — Patient Instructions (Signed)
Medication Instructions:  Your physician has recommended you make the following change in your medication:  1.) increase atorvastatin to 20 mg daily 2.) start furosemide (Lasix) 10 mg daily - take for 5 days and let Dr. Ali Lowe know if this helps your breathing so that he may prescribe long term. (This is a 20 mg tablet - take half tablet)  *If you need a refill on your cardiac medications before your next appointment, please call your pharmacy*   Lab Work: Please return in 2 months for blood work (lipids, liver)   Testing/Procedures: none   Follow-Up: At [facility], you and your health needs are our priority.  As part of our continuing mission to provide you with exceptional heart care, we have created designated Provider Care Teams.  These Care Teams include your primary Cardiologist (physician) and Advanced Practice Providers (APPs -  Physician Assistants and Nurse Practitioners) who all work together to provide you with the care you need, when you need it.   Your next appointment:   9 month(s)  Provider:   Advanced Practice Provider (NP or PA-C)

## 2022-02-27 NOTE — Progress Notes (Signed)
Office Visit Note  Patient: Nathan [name]             Date of Birth: [birth year]           MRN: [id number]             PCP: [facility] Referring: [facility] Visit Date: 02/28/2022   Subjective:  Follow-up (No improvement)   History of Present Illness: Nathan [name] is a 52 y.o. male here for follow up for sarcoidosis with pulmonary and cutaneous involvement on methotrexate 15 mg p.o. weekly and folic acid 1 mg daily.  Since her last visit he had PET scan that looked good no evidence of cardiac sarcoidosis activity.  CT scan in October with stable lymphadenopathy and bandlike and nodular opacities in the lungs, although the associated nondiagnostic CT in December was consistent with hypermetabolic mediastinal and hilar lymphadenopathy and bilateral pulmonary parenchymal opacities consistent with inflammatory process and active granulomatous disease.  He is tolerating the methotrexate without any difficulty.  Still been noticing some ongoing skin rash with minimally raised hyperpigmented spots along the medial border of the eyes.  He has not felt a significant improvement with his neck pain on the medication.  Still feels restriction in his range of motion especially looking towards the right and gets pain painful experiences towards the left side.   Previous HPI 12/19/21 Nathan [name] is a 52 y.o. 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 He has not noticed any problems with the medication but also no improvement in symptoms so far. Still pain worst in the neck and upper back with radiation into back and with headaches. Asthma symptoms doing somewhat worse this year. He had cardiac MRI but is now recommended for cardiac PET scan to evaluate but not scheduled yet.   Previous HPI 11/15/2021 Nathan [name] is a 52 y.o. male  here for joint pains with history of pulmonary and cutaneous sacoidosis.  His worst affected area is pretty constant pain in the base of the neck area frequently with radiation up the neck and posterior headaches associated with this.  He has had problems going on for years but seems to be progressively getting somewhat worse.  He does get some pain in extremities elsewhere but by far the most limiting for him is in the base of the neck.  He has tried multiple NSAIDs including meloxicam and Celebrex is also tried muscle relaxant medications none of which were very significantly beneficial.  He is tried physical therapy completing courses and had local injections that did not greatly relieve symptoms.  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  He had most recent neck MRI in 2022 based on findings had discussed surgery options but is looking to avoid this due to concern about complications or long-term needing additional revision. Original diagnosis of sarcoidosis was based on pulmonary imaging finding and treated with very prolonged prednisone taper.  Initially only had respiratory symptoms with some mild dyspnea and coughing that improved with the steroid treatments.  More recently this year he had an episode of similar symptoms coming back and was treated with a round of steroids from pulmonology clinic in noticed a increase in blood glucose up to 400s.  He is also developed some new skin rashes on the face previously had involvement on extremities but no facial  rashes with original symptoms.  He is prescribed topical medication by Dr. Denna Haggard but has not seen any difference so far.   Review of Systems  Constitutional:  Positive for fatigue.  HENT:  Positive for mouth dryness. Negative for mouth sores.   Eyes:  Positive for dryness.  Respiratory:  Positive for shortness of breath.   Cardiovascular:  Positive for chest pain and palpitations.  Gastrointestinal:  Positive for diarrhea.  Negative for blood in stool and constipation.  Endocrine: Negative for increased urination.  Genitourinary:  Negative for involuntary urination.  Musculoskeletal:  Positive for joint pain, gait problem, joint pain, joint swelling, myalgias, muscle weakness, morning stiffness, muscle tenderness and myalgias.  Skin:  Positive for rash and sensitivity to sunlight. Negative for color change and hair loss.  Allergic/Immunologic: Negative for susceptible to infections.  Neurological:  Positive for dizziness and headaches.  Hematological:  Negative for swollen glands.  Psychiatric/Behavioral:  Positive for depressed mood and sleep disturbance. The patient is nervous/anxious.     PMFS History:  Patient Active Problem List   Diagnosis Date Noted   High risk medication use 11/15/2021   HLD (hyperlipidemia) 09/13/2021   Asthma 11/10/2020   COVID-19 virus infection 10/21/2019   Shoulder pain, left 10/23/2016   Other chest pain 08/22/2014   Upper airway cough syndrome 07/23/2014   Sarcoidosis 03/06/2011    Past Medical History:  Diagnosis Date   Arthritis    Asthma    'I think I might have asthma"   Chronic back pain    Dyspnea    chronic   Hyperlipidemia    Sarcoidosis     Family History  Problem Relation Age of Onset   Hypertension Mother    Diabetes Mother    Heart attack Father    Diabetes Father    Hypertension Father    Gout Father    Colon polyps Sister    Heart disease Paternal Uncle    Prostate cancer Paternal Uncle    Heart disease Maternal Grandfather    Anemia Daughter    Anemia Daughter    Colon cancer Neg Hx    Esophageal cancer Neg Hx    Rectal cancer Neg Hx    Stomach cancer Neg Hx    Past Surgical History:  Procedure Laterality Date   NO PAST SURGERIES     Social History   Social History Narrative   Not on file   Immunization History  Administered Date(s) Administered   Influenza,inj,Quad PF,6+ Mos 11/10/2020   PFIZER(Purple Top)SARS-COV-2  Vaccination 10/27/2020     Objective: Vital Signs: BP 134/89 (BP Location: Right Arm, Patient Position: Sitting, Cuff Size: Normal)   Pulse 73   Resp 14   Ht '5\' 8"'$  (1.727 m)   Wt 245 lb (111.1 kg)   BMI 37.25 kg/m    Physical Exam Eyes:     Conjunctiva/sclera: Conjunctivae normal.  Cardiovascular:     Rate and Rhythm: Normal rate and regular rhythm.  Pulmonary:     Effort: Pulmonary effort is normal.     Breath sounds: Normal breath sounds.  Skin:    General: Skin is warm and dry.     Findings: Rash present.     Comments: Hyperpigmented papular rash along medial border of both eyes  Neurological:     Mental Status: He is alert.  Psychiatric:        Mood and Affect: Mood normal.      Musculoskeletal Exam:  Neck rightward rotation range of motion slightly  restricted with pain radiating to left side, some stiffness with flexion but appears intact Increased muscle tone along base of the neck paraspinal muscles and trapezius muscles with some tenderness to pressure without radiation Wrists full ROM no tenderness or swelling Fingers full ROM no tenderness or swelling Knees full ROM no tenderness or swelling   Investigation: No additional findings.  Imaging: DG Foot Complete Right  Result Date: 03/08/2022 CLINICAL DATA:  Right third toe pain.  Injury yesterday. EXAM: RIGHT FOOT COMPLETE - 3+ VIEW COMPARISON:  Right foot radiographs 08/06/2017 FINDINGS: No acute fracture or dislocation is identified. Mild hallux valgus deformity is again noted with mild degenerative changes at the first MTP joint. There is a small posterior calcaneal enthesophyte. The soft tissues are unremarkable. IMPRESSION: No acute osseous abnormality. Electronically Signed   By: Logan Bores M.D.   On: 03/08/2022 19:42    Recent Labs: Lab Results  Component Value Date   WBC 5.3 12/19/2021   HGB 13.3 12/19/2021   PLT 270 12/19/2021   NA 142 12/19/2021   K 3.9 12/19/2021   CL 103 12/19/2021   CO2 33  (H) 12/19/2021   GLUCOSE 87 12/19/2021   BUN 8 12/19/2021   CREATININE 0.62 12/19/2021   BILITOT 0.8 12/19/2021   ALKPHOS 81 11/08/2021   AST 22 12/19/2021   ALT 20 12/19/2021   PROT 7.0 12/19/2021   ALBUMIN 4.7 11/08/2021   CALCIUM 8.9 12/19/2021   GFRAA >60 09/17/2019    Speciality Comments: No specialty comments available.  Procedures:  No procedures performed Allergies: Tizanidine, Hydrocodone-acetaminophen, Oxycodone-acetaminophen, Percocet [oxycodone-acetaminophen], and Vicodin [hydrocodone-acetaminophen]   Assessment / Plan:     Visit Diagnoses: Sarcoidosis - Plan: methotrexate (RHEUMATREX) 2.5 MG tablet, folic acid (FOLVITE) 1 MG tablet  Workup is reassuring about cardiac involvement but unfortunately suggestive for active disease with his increase in skin rashes and on his CT findings.  Will plan to increase the methotrexate to 20 mg p.o. weekly and continue folic acid 1 mg daily.  If he notes more complete improvement may need to consider second agent or resuming low-dose steroid at least in the short-term.  Neck pain - Plan: DULoxetine (CYMBALTA) 30 MG capsule  So far no difference in neck pain with change in sarcoidosis treatment I suspect this is probably more degenerative arthritis and may have some myofascial pain problem in the upper back too.  He has not had great improvement with injections in does not want to pursue surgical treatment.  Will try starting duloxetine 30 mg daily for symptom treatment.  High risk medication use  Recent labs obtained on 12/27 were reviewed including CMP which is normal QuantiFERON was negative lipid panel was mostly normal with very slightly low HDL of 34.  He is a fairly recent with no problem for methotrexate toxicity monitoring  Orders: No orders of the defined types were placed in this encounter.  Meds ordered this encounter  Medications   methotrexate (RHEUMATREX) 2.5 MG tablet    Sig: Take 8 tablets (20 mg total) by mouth  once a week. Caution:Chemotherapy. Protect from light.    Dispense:  40 tablet    Refill:  2   folic acid (FOLVITE) 1 MG tablet    Sig: Take 1 tablet (1 mg total) by mouth daily.    Dispense:  90 tablet    Refill:  3   DULoxetine (CYMBALTA) 30 MG capsule    Sig: Take 1 capsule (30 mg total) by mouth daily.  Dispense:  30 capsule    Refill:  2     Follow-Up Instructions: Return in about 3 months (around 05/30/2022) for Sarcoidosis on MTX f/u 81mo.   CCollier Salina MD  Note - This record has been created using [employer]  Chart creation errors have been sought, but may not always  have been located. Such creation errors do not reflect on  the standard of medical care.

## 2022-02-28 ENCOUNTER — Other Ambulatory Visit (HOSPITAL_COMMUNITY): Payer: Self-pay

## 2022-02-28 ENCOUNTER — Encounter: Payer: Self-pay | Admitting: Internal Medicine

## 2022-02-28 ENCOUNTER — Ambulatory Visit: Payer: Commercial Managed Care - HMO | Attending: Internal Medicine | Admitting: Internal Medicine

## 2022-02-28 ENCOUNTER — Telehealth: Payer: Self-pay

## 2022-02-28 VITALS — BP 134/89 | HR 73 | Resp 14 | Ht 68.0 in | Wt 245.0 lb

## 2022-02-28 DIAGNOSIS — D869 Sarcoidosis, unspecified: Secondary | ICD-10-CM

## 2022-02-28 DIAGNOSIS — M542 Cervicalgia: Secondary | ICD-10-CM

## 2022-02-28 MED ORDER — DULOXETINE HCL 30 MG PO CPEP: 30.0000 mg | ORAL_CAPSULE | Freq: Every day | ORAL | 2 refills | Status: DC

## 2022-02-28 MED ORDER — FOLIC ACID 1 MG PO TABS: 1.0000 mg | ORAL_TABLET | Freq: Every day | ORAL | 3 refills | Status: DC

## 2022-02-28 MED ORDER — METHOTREXATE SODIUM 2.5 MG PO TABS: 20.0000 mg | ORAL_TABLET | ORAL | 2 refills | Status: DC

## 2022-02-28 NOTE — Telephone Encounter (Signed)
PA has been submitted and will be updated in additional encounter.

## 2022-02-28 NOTE — Telephone Encounter (Signed)
PA request received via providers office for [facility] Aerosphere 160-9-4.8MCG/ACT aerosol  PA has been submitted to [employer] and is pending determination.   Key: [id number]

## 2022-03-01 MED ORDER — MOMETASONE FURO-FORMOTEROL FUM 100-5 MCG/ACT IN AERO: 2.0000 | INHALATION_SPRAY | Freq: Two times a day (BID) | RESPIRATORY_TRACT | 0 refills | Status: DC

## 2022-03-01 NOTE — Telephone Encounter (Signed)
Have him try Dulera 100

## 2022-03-01 NOTE — Telephone Encounter (Signed)
PA has been DENIED due to :   Per your health plan's criteria, this drug is covered if you meet the following: All of the following: (A) One of the following: (i) You have failed at least one preferred drugs as confirmed by claims history or submission of medical records. The preferred drug: Advair Diskus, Advair HFA, and Dulera. (ii) You cannot use one preferred drug (please specify contraindication or intolerance). (B) The use of this drug is supported (for a Food and Drug Administration-approved indication or by an appropriate compendia of current literature): chronic bronchitis, chronic obstructive pulmonary disease (COPD), and emphysema.

## 2022-03-01 NOTE — Telephone Encounter (Signed)
Dr. Lamonte Sakai, Please advise if your okay with [place] sending one of the preferred inhalers below  Advair Diskus, Advair HFA, and Dulera.

## 2022-03-01 NOTE — Telephone Encounter (Signed)
Called Pt to let him know that we sent in a new inhaler called Dulera for him per his insurance. Nothing further needed.

## 2022-03-01 NOTE — Addendum Note (Signed)
Addended by: Irine Seal B on: 03/01/2022 02:15 PM   Modules accepted: Orders

## 2022-03-02 ENCOUNTER — Telehealth: Payer: Self-pay | Admitting: Emergency Medicine

## 2022-03-02 NOTE — Telephone Encounter (Signed)
Spoke with the pt  Nathan [name] was sent yesterday I made him aware that this was sent to [facility] since the [facility] not preferred  He verbalized understanding  Nothing further needed

## 2022-03-08 ENCOUNTER — Emergency Department (HOSPITAL_COMMUNITY): Payer: Commercial Managed Care - HMO

## 2022-03-08 ENCOUNTER — Other Ambulatory Visit (HOSPITAL_COMMUNITY): Payer: Self-pay

## 2022-03-08 ENCOUNTER — Other Ambulatory Visit: Payer: Self-pay

## 2022-03-08 ENCOUNTER — Encounter (HOSPITAL_COMMUNITY): Payer: Self-pay | Admitting: Emergency Medicine

## 2022-03-08 ENCOUNTER — Ambulatory Visit (INDEPENDENT_AMBULATORY_CARE_PROVIDER_SITE_OTHER): Payer: Commercial Managed Care - HMO | Admitting: Acute Care

## 2022-03-08 ENCOUNTER — Emergency Department (HOSPITAL_COMMUNITY)
Admission: EM | Admit: 2022-03-08 | Discharge: 2022-03-08 | Disposition: A | Discharge status: Home / Self Care | Payer: Commercial Managed Care - HMO | Attending: Emergency Medicine | Admitting: Emergency Medicine

## 2022-03-08 ENCOUNTER — Encounter: Payer: Self-pay | Admitting: Acute Care

## 2022-03-08 VITALS — BP 132/86 | HR 85 | Temp 98.5°F | Ht 68.0 in | Wt 240.0 lb

## 2022-03-08 DIAGNOSIS — S99921A Unspecified injury of right foot, initial encounter: Secondary | ICD-10-CM | POA: Insufficient documentation

## 2022-03-08 DIAGNOSIS — Z7982 Long term (current) use of aspirin: Secondary | ICD-10-CM | POA: Diagnosis not present

## 2022-03-08 DIAGNOSIS — J984 Other disorders of lung: Secondary | ICD-10-CM

## 2022-03-08 DIAGNOSIS — D869 Sarcoidosis, unspecified: Secondary | ICD-10-CM

## 2022-03-08 DIAGNOSIS — W2209XA Striking against other stationary object, initial encounter: Secondary | ICD-10-CM | POA: Diagnosis not present

## 2022-03-08 DIAGNOSIS — J439 Emphysema, unspecified: Secondary | ICD-10-CM

## 2022-03-08 MED ORDER — ALBUTEROL SULFATE HFA 108 (90 BASE) MCG/ACT IN AERS: 2.0000 | INHALATION_SPRAY | Freq: Four times a day (QID) | RESPIRATORY_TRACT | 6 refills | Status: DC | PRN

## 2022-03-08 MED ORDER — BREZTRI AEROSPHERE 160-9-4.8 MCG/ACT IN AERO: 2.0000 | INHALATION_SPRAY | Freq: Two times a day (BID) | RESPIRATORY_TRACT | 0 refills | Status: DC

## 2022-03-08 NOTE — ED Provider Notes (Signed)
[facility] Provider Note   CSN: [id number] Arrival date & time: 03/08/22  1623     History  Chief Complaint  Patient presents with   Toe Injury    Kellyn Mansfield is a 52 y.o. male.  Patient with noncontributory past medical history presents today with complaints of right toe injury. He states that yesterday he accidentally hit his right third toe against a brick.  Endorses pain since then that has been persistent in nature.  He has been able to walk with some pain.  Denies any other injuries or complaints.  The history is provided by the patient. No language interpreter was used.       Home Medications Prior to Admission medications   Medication Sig Start Date End Date Taking? Authorizing Provider  acetaminophen (TYLENOL) 500 MG tablet Take 1 tablet (500 mg total) by mouth every 6 (six) hours as needed. 04/01/21   Varney Biles, MD  albuterol (VENTOLIN HFA) 108 (90 Base) MCG/ACT inhaler Inhale 2 puffs into the lungs every 6 (six) hours as needed for wheezing or shortness of breath. 03/08/22   Magdalen Spatz, NP  amoxicillin-clavulanate (AUGMENTIN) 875-125 MG tablet Take 1 tablet by mouth every 12 (twelve) hours. Patient not taking: Reported on 02/28/2022 02/20/22   Tacy Learn, PA-C  ANORO ELLIPTA 62.5-25 MCG/ACT AEPB INHALE ONE PUFF BY MOUTH ONE TIME DAILY Patient not taking: Reported on 02/28/2022 01/31/22   Collene Gobble, MD  aspirin EC 81 MG tablet Take 1 tablet (81 mg total) by mouth daily. Swallow whole. 02/28/21   Early Osmond, MD  atorvastatin (LIPITOR) 20 MG tablet Take 1 tablet (20 mg total) by mouth daily. 02/27/22   Early Osmond, MD  Budeson-Glycopyrrol-Formoterol (BREZTRI AEROSPHERE) 160-9-4.8 MCG/ACT AERO Inhale 2 puffs into the lungs in the morning and at bedtime. 02/17/22   Collene Gobble, MD  Budeson-Glycopyrrol-Formoterol (BREZTRI AEROSPHERE) 160-9-4.8 MCG/ACT AERO Inhale 2 puffs into the lungs in the  morning and at bedtime. 03/08/22   Magdalen Spatz, NP  budesonide-formoterol (SYMBICORT) 160-4.5 MCG/ACT inhaler Inhale 2 puffs into the lungs 2 (two) times daily. Patient not taking: Reported on 03/08/2022    [provider]  celecoxib (CELEBREX) 100 MG capsule Take 100 mg by mouth daily. 03/18/21   [provider]  DULoxetine (CYMBALTA) 30 MG capsule Take 1 capsule (30 mg total) by mouth daily. 02/28/22   Rice, Resa Miner, MD  ezetimibe (ZETIA) 10 MG tablet Take 10 mg by mouth daily. 05/23/21   [provider]  fluticasone (FLONASE) 50 MCG/ACT nasal spray Place 1 spray into both nostrils 2 (two) times daily. 12/26/19   Fulp, Cammie, MD  folic acid (FOLVITE) 1 MG tablet Take 1 tablet (1 mg total) by mouth daily. 02/28/22   Rice, Resa Miner, MD  furosemide (LASIX) 20 MG tablet Take 0.5 tablets (10 mg total) by mouth daily. 02/27/22   Early Osmond, MD  gabapentin (NEURONTIN) 100 MG capsule Take 100 mg by mouth 3 (three) times daily. 08/24/21   [provider]  ipratropium (ATROVENT) 0.06 % nasal spray Place 2 sprays into both nostrils 4 (four) times daily as needed for rhinitis. 12/26/19   Fulp, Cammie, MD  ipratropium-albuterol (DUONEB) 0.5-2.5 (3) MG/3ML SOLN     [provider]  levocetirizine (XYZAL) 5 MG tablet Take 5 mg by mouth every evening.    [provider]  metFORMIN (GLUCOPHAGE-XR) 500 MG 24 hr tablet Take 500  mg by mouth 2 (two) times daily. 08/24/21   [provider]  methotrexate (RHEUMATREX) 2.5 MG tablet Take 8 tablets (20 mg total) by mouth once a week. Caution:Chemotherapy. Protect from light. 02/28/22   Collier Salina, MD  mometasone-formoterol (DULERA) 100-5 MCG/ACT AERO Inhale 2 puffs into the lungs 2 (two) times daily. Patient not taking: Reported on 03/08/2022 03/01/22   Collene Gobble, MD  pantoprazole (PROTONIX) 40 MG tablet Take 1 tablet (40 mg total) by mouth daily. 04/09/20   Sherwood Gambler, MD   tacrolimus (PROTOPIC) 0.1 % ointment Apply topically 2 (two) times daily. 09/07/21   Lavonna Monarch, MD  Tiotropium Bromide-Olodaterol (STIOLTO RESPIMAT) 2.5-2.5 MCG/ACT AERS Inhale 2 puffs into the lungs daily. Patient not taking: Reported on 03/08/2022 12/07/21   Collene Gobble, MD  triamcinolone ointment (KENALOG) 0.1 % Apply 1 Application topically 2 (two) times daily.    [provider]      Allergies    Tizanidine, Hydrocodone-acetaminophen, Oxycodone-acetaminophen, Percocet [oxycodone-acetaminophen], and Vicodin [hydrocodone-acetaminophen]    Review of Systems   Review of Systems  Musculoskeletal:  Positive for arthralgias.  All other systems reviewed and are negative.   Physical Exam Updated Vital Signs BP 132/87 (BP Location: Left Arm)   Pulse 89   Temp 98.8 F (37.1 C) (Oral)   Resp 20   Ht '5\' 8"'$  (1.727 m)   Wt 108.9 kg   SpO2 95%   BMI 36.49 kg/m  Physical Exam Vitals and nursing note reviewed.  Constitutional:      General: He is not in acute distress.    Appearance: Normal appearance. He is normal weight. He is not ill-appearing, toxic-appearing or diaphoretic.  HENT:     Head: Normocephalic and atraumatic.  Cardiovascular:     Rate and Rhythm: Normal rate.  Pulmonary:     Effort: Pulmonary effort is normal. No respiratory distress.  Musculoskeletal:        General: Normal range of motion.     Cervical back: Normal range of motion.     Comments: Tenderness to palpation of the third toe of the right foot.  No obvious deformity, bruising, swelling, or overlying skin changes.  Capillary refill less than 2 seconds.  Able to move his toes with some discomfort  Skin:    General: Skin is warm and dry.  Neurological:     General: No focal deficit present.     Mental Status: He is alert.  Psychiatric:        Mood and Affect: Mood normal.        Behavior: Behavior normal.     ED Results / Procedures / Treatments   Labs (all labs ordered are listed,  but only abnormal results are displayed) Labs Reviewed - No data to display  EKG None  Radiology DG Foot Complete Right  Result Date: 03/08/2022 CLINICAL DATA:  Right third toe pain.  Injury yesterday. EXAM: RIGHT FOOT COMPLETE - 3+ VIEW COMPARISON:  Right foot radiographs 08/06/2017 FINDINGS: No acute fracture or dislocation is identified. Mild hallux valgus deformity is again noted with mild degenerative changes at the first MTP joint. There is a small posterior calcaneal enthesophyte. The soft tissues are unremarkable. IMPRESSION: No acute osseous abnormality. Electronically Signed   By: Logan Bores M.D.   On: 03/08/2022 19:42    Procedures Procedures    Medications Ordered in ED Medications - No data to display  ED Course/ Medical Decision Making/ A&P  Medical Decision Making  Patient presents today with complaints of toe injury yesterday.  He is afebrile, nontoxic-appearing, and in no acute distress with reassuring vital signs.  Physical exam reveals tenderness to palpation of the third toe without any crepitus, deformity, or overlying skin changes.  X-ray imaging obtained of same which reveals no acute findings.  I have personally reviewed and interpreted this imaging and agree with radiology interpretation.  Patient given postop shoe per his request.  Also buddy tape and given instructions for use.  Recommend RICE with Tylenol/ibuprofen as needed for pain.  Follow-up with PCP as needed.  Patient is understanding and amenable with plan, educated on red flag symptoms that would prompt immediate return.  Patient discharged in stable condition.   Final Clinical Impression(s) / ED Diagnoses Final diagnoses:  Injury of toe on right foot, initial encounter    Rx / DC Orders ED Discharge Orders     None     An After Visit Summary was printed and given to the patient.     Nestor Lewandowsky 03/08/22 2135    Lacretia Leigh, MD 03/09/22  2135

## 2022-03-08 NOTE — ED Notes (Signed)
Post op boot applied to pts R foot.

## 2022-03-08 NOTE — Progress Notes (Signed)
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  He has been on Symbicort in the past but has had difficulty staying on this medication due to cost and insurance coverage.  He was seen  with skin changes, pruritic rash around his right eye and right nostril he was referred to dermatology in 2023.  Pt. Remains on Protonix, fluticasone nasal spray twice a day, Atrovent nasal spray as needed, Zyrtec. He is followed by Dr. Lamonte Sakai    03/08/2022 Pt. Presents for  follow up. He is here for worsening symptoms around his eyes. He saw the dermatologist last year and they treated him with an eye cream. He has some left, and he has used it with no significant improvement his symptoms.He says that the dermatologist he had seen in the past , Dr. Denna Haggard, is no longer with the practice he was referred to, so he does not currently have a dermatologist.  He was seen for suspected sarcoid his flare by Dr. Vernelle Emerald, his rheumatologist. He increased his methotrexate on 02/27/2022 to 20 mg per week on Wednesdays. I considered adding prednisone, but I will not add prednisone as he has just had an increase in his methotrexate, and it can make side effects of methotrexate worse, and it affects his blood sugars so significantly. I have encouraged him to follow up with his rheumatologist. We will check an ACE level today.  He does  he has an eye appointment 06/2022. I have asked him to see if he can get the eye appointment moved up , as he is having issues with the area around his eyes.He verbalized understanding and stated he would get the appointment moved up.   He has been followed by cardiology. He had a MR cardiac velocity flow map done 11/16/2021. There was a small area of basal lateral subendocardial LGE noted. Cardiac sarcoid is on the patient's differential.  Cardiac PET scan was recommended by radiology, per cardiolgy note by Dr. Ali Lowe 02/27/2022 there is no evidence of cardiac sarcoid on his PET scan.He has started the patient on a low dose of Lasix 10 mg daily to help with his dyspnea.     Pt. Does endorse some shortness of breath with exertion, and some chest heaviness. He denies a productive cough.He  has been unable to afford his maintenance inhaler medication., so he is not using a maintenance medication.He has a very old rescue inhaler, which is expired.. He has mixed obstructive/restrictive disease per PFT. I will have him complete financial assistance paperwork for [place], and we will see if he qualifies. If he does, he will get medication at a reduced cost. We will give him samples today to get him through until we can determine his eligibility for reduced cost medication. Last CT Chest was 11/2021. Dr.Byrum checks these annually. It showed stable mediastinal and hilar lymphadenopathy with bandlike and nodular opacities in the lungs bilaterally, compatible with history of sarcoidosis. Ace level today will help to confirm a flare.   Test Results: 02/10/2022 Cardiac PET Metabolism: Cardiac PET/CT metabolic study with K-09 FDG and scanned on PET Discovery MI. There is no abnormal   metabolism to indicate the presence of an active inflammatory process in the left ventricular myocardium.   There is no abnormal metabolism in the right ventricle.   There is no evidence to suggest active cardiac sarcoidosis.   Perfusion/Function: Gated  cardiac PET/CT rest myocardial perfusion study with [id number] demonstrates: 1. Normal myocardial   perfusion. 2. Normal left ventricular systolic function.   Non-diagnostic CT obtained for attenuation correction: 1. Hypermetabolic mediastinal and hilar lymphadenopathy in   appropriate clinical context is compatible with active granulomatous disease. There are bilateral pulmonary parenchymal   opacities also consistent  with infectious/inflammatory etiology.       Latest Ref Rng & Units 12/19/2021    3:36 PM 11/15/2021    9:50 AM 07/13/2021   11:36 AM  CBC  WBC 3.8 - 10.8 Thousand/uL 5.3  4.3  3.8   Hemoglobin 13.2 - 17.1 g/dL 13.3  12.6  13.1   Hematocrit 38.5 - 50.0 % 40.3  38.3  40.1   Platelets 140 - 400 Thousand/uL 270  239  285.0        Latest Ref Rng & Units 12/19/2021    3:36 PM 11/15/2021    9:50 AM 04/18/2021    6:24 AM  BMP  Glucose 65 - 99 mg/dL 87  113  282   BUN 7 - 25 mg/dL '8  12  17   '$ Creatinine 0.60 - 1.29 mg/dL 0.62  0.69  0.66   BUN/Creat Ratio 6 - 22 (calc) SEE NOTE:  SEE NOTE:    Sodium 135 - 146 mmol/L 142  143  135   Potassium 3.5 - 5.3 mmol/L 3.9  4.3  3.5   Chloride 98 - 110 mmol/L 103  107  98   CO2 20 - 32 mmol/L 33  30  28   Calcium 8.6 - 10.3 mg/dL 8.9  9.1  8.6     BNP    Component Value Date/Time   BNP 18.2 04/10/2014 0811    ProBNP No results found for: "PROBNP"  PFT    Component Value Date/Time   FEV1PRE 1.81 03/15/2020 1452   FEV1POST 1.86 03/15/2020 1452   FVCPRE 2.26 03/15/2020 1452   FVCPOST 2.24 03/15/2020 1452   TLC 4.12 03/15/2020 1452   DLCOUNC 20.05 03/15/2020 1452   PREFEV1FVCRT 80 03/15/2020 1452   PSTFEV1FVCRT 83 03/15/2020 1452    No results found.   Past medical hx Past Medical History:  Diagnosis Date   Arthritis    Asthma    'I think I might have asthma"   Chronic back pain    Dyspnea    chronic   Hyperlipidemia    Sarcoidosis      Social History   Tobacco Use   Smoking status: Former    Packs/day: 1.00    Years: 20.00    Total pack years: 20.00    Types: Cigarettes    Quit date: 01/13/2011    Years since quitting: 11.1    Passive exposure: Past   Smokeless tobacco: Never  Vaping Use   Vaping Use: Never used  Substance Use Topics   Alcohol use: No    Alcohol/week: 0.0 standard drinks of alcohol   Drug use: No    [name] reports that he quit smoking about 11 years ago. His smoking use included  cigarettes. He has a 20.00 pack-year smoking history. He has been exposed to tobacco smoke. He has never used smokeless tobacco. He reports that he does not drink alcohol and does not use drugs.  Tobacco Cessation: Former smoker with a 20 pack year smoking history, quit 2012   Past surgical hx, Family hx, Social hx all reviewed.  Current Outpatient Medications on File Prior to Visit  Medication Sig   acetaminophen (  TYLENOL) 500 MG tablet Take 1 tablet (500 mg total) by mouth every 6 (six) hours as needed.   albuterol (VENTOLIN HFA) 108 (90 Base) MCG/ACT inhaler Inhale 2 puffs into the lungs every 6 (six) hours as needed for wheezing or shortness of breath.   aspirin EC 81 MG tablet Take 1 tablet (81 mg total) by mouth daily. Swallow whole.   atorvastatin (LIPITOR) 20 MG tablet Take 1 tablet (20 mg total) by mouth daily.   Budeson-Glycopyrrol-Formoterol (BREZTRI AEROSPHERE) 160-9-4.8 MCG/ACT AERO Inhale 2 puffs into the lungs in the morning and at bedtime.   celecoxib (CELEBREX) 100 MG capsule Take 100 mg by mouth daily.   DULoxetine (CYMBALTA) 30 MG capsule Take 1 capsule (30 mg total) by mouth daily.   ezetimibe (ZETIA) 10 MG tablet Take 10 mg by mouth daily.   fluticasone (FLONASE) 50 MCG/ACT nasal spray Place 1 spray into both nostrils 2 (two) times daily.   folic acid (FOLVITE) 1 MG tablet Take 1 tablet (1 mg total) by mouth daily.   furosemide (LASIX) 20 MG tablet Take 0.5 tablets (10 mg total) by mouth daily.   gabapentin (NEURONTIN) 100 MG capsule Take 100 mg by mouth 3 (three) times daily.   ipratropium (ATROVENT) 0.06 % nasal spray Place 2 sprays into both nostrils 4 (four) times daily as needed for rhinitis.   ipratropium-albuterol (DUONEB) 0.5-2.5 (3) MG/3ML SOLN    levocetirizine (XYZAL) 5 MG tablet Take 5 mg by mouth every evening.   metFORMIN (GLUCOPHAGE-XR) 500 MG 24 hr tablet Take 500 mg by mouth 2 (two) times daily.   methotrexate (RHEUMATREX) 2.5 MG tablet Take 8 tablets  (20 mg total) by mouth once a week. Caution:Chemotherapy. Protect from light.   pantoprazole (PROTONIX) 40 MG tablet Take 1 tablet (40 mg total) by mouth daily.   tacrolimus (PROTOPIC) 0.1 % ointment Apply topically 2 (two) times daily.   triamcinolone ointment (KENALOG) 0.1 % Apply 1 Application topically 2 (two) times daily.   amoxicillin-clavulanate (AUGMENTIN) 875-125 MG tablet Take 1 tablet by mouth every 12 (twelve) hours. (Patient not taking: Reported on 02/28/2022)   ANORO ELLIPTA 62.5-25 MCG/ACT AEPB INHALE ONE PUFF BY MOUTH ONE TIME DAILY (Patient not taking: Reported on 02/28/2022)   budesonide-formoterol (SYMBICORT) 160-4.5 MCG/ACT inhaler Inhale 2 puffs into the lungs 2 (two) times daily. (Patient not taking: Reported on 03/08/2022)   mometasone-formoterol (DULERA) 100-5 MCG/ACT AERO Inhale 2 puffs into the lungs 2 (two) times daily. (Patient not taking: Reported on 03/08/2022)   Tiotropium Bromide-Olodaterol (STIOLTO RESPIMAT) 2.5-2.5 MCG/ACT AERS Inhale 2 puffs into the lungs daily. (Patient not taking: Reported on 03/08/2022)   No current facility-administered medications on file prior to visit.     Allergies  Allergen Reactions   Tizanidine Itching   Hydrocodone-Acetaminophen Itching   Oxycodone-Acetaminophen Itching   Percocet [Oxycodone-Acetaminophen] Itching    Able to take tylenol   Vicodin [Hydrocodone-Acetaminophen] Itching    Review Of Systems:  Constitutional:   No  weight loss, night sweats,  Fevers, chills, fatigue, or  lassitude.  HEENT:   No headaches,  Difficulty swallowing,  Tooth/dental problems, or  Sore throat,                No sneezing, itching, ear ache, nasal congestion, post nasal drip,   CV:  No chest pain, but chest tightness, No Orthopnea, PND, swelling in lower extremities, anasarca, dizziness, palpitations, syncope.   GI  + heartburn, indigestion, No abdominal pain, nausea, vomiting, diarrhea, change in bowel habits,  loss of appetite, bloody  stools.   Resp: + shortness of breath with exertion less at rest.  No excess mucus, no productive cough,  No non-productive cough,  No coughing up of blood.  No change in color of mucus.  No wheezing.  No chest wall deformity  Skin: + rash around his eyes bilaterally, sarcoid lesions   GU: no dysuria, change in color of urine, no urgency or frequency.  No flank pain, no hematuria   MS:  No joint pain or swelling.  No decreased range of motion.  + back pain.  Psych:  No change in mood or affect. No depression or anxiety.  No memory loss.   Vital Signs BP 132/86 (BP Location: Left Arm, Patient Position: Sitting, Cuff Size: Large)   Pulse 85   Temp 98.5 F (36.9 C) (Oral)   Ht '5\' 8"'$  (1.727 m)   Wt 240 lb (108.9 kg)   SpO2 97%   BMI 36.49 kg/m    Physical Exam:  General- No distress,  A&O x3, pleasant ENT: No sinus tenderness, TM clear, pale nasal mucosa, no oral exudate,no post nasal drip, no LAN Cardiac: S1, S2, regular rate and rhythm, no murmur Chest: No wheeze/ rales/ dullness; no accessory muscle use, no nasal flaring, no sternal retractions Abd.: Soft Non-tender, ND, BS +, Body mass index is 36.49 kg/m.  Ext: No clubbing cyanosis, edema Neuro:  normal strength, MAE x 4, A&O x 3 Skin: + sarcoid  rash around eyes and nose, other derm lesions , warm and dry, intact Psych: normal mood and behavior   Assessment/Plan Sarcoid, pulmonary and derm Plan We will get an ACE level today. If this is elevated, we can consider a prednisone taper Follow up with Dr. Benjamine Mola for flare as he has recently increased your Methotrexate.  Please follow up with your eye doctor, see if they can see you sooner.  I will place a referral for dermatology.  You should get a call to get an appointment scheduled.  Follow up with Dr. Lamonte Sakai or Judson Roch NP in 2 months. Next CT Chest due 11/2022 Please contact office for sooner follow up if symptoms do not improve or worsen or seek emergency care    Mixed  obstructive / restrictive lung disease Currently not on maintenance inhaler 2/2 cost Plan We will re-order your albuterol inhaler. We will give you some samples of Breztri, and we will give you paperwork to complete for financial assistance to see if we can get you medication at a reduced cost.  Fill this out and return to the office so we can fax this. Take breztri 2 puffs in the morning and 2 puffs in the evening Rinse mouth after use.  Follow up with Dr. Lamonte Sakai or Judson Roch NP in 2 months.  I spent 40 minutes dedicated to the care of this patient on the date of this encounter to include pre-visit review of records, face-to-face time with the patient discussing conditions above, post visit ordering of testing, clinical documentation with the electronic health record, making appropriate referrals as documented, and communicating necessary information to the patient's healthcare team.   Magdalen Spatz, NP 03/08/2022  2:40 PM

## 2022-03-08 NOTE — ED Triage Notes (Addendum)
Pt states he had injury to right 3rd toe yesterday where he smashed in against a brick. Pt currently able to ambulate.

## 2022-03-08 NOTE — Patient Instructions (Addendum)
It is good to see you today We will re-order your albuterol inhaler. We will give you some samples of Breztri, and we will give you paperwork to complete for financial assistance to see if we can get you medication at a reduced cost.  Fill this out and return to the office so we can fax this. Take breztri 2 puffs in the morning and 2 puffs in the evening Rinse mouth after use.  We will get an ACE level today. Follow up with Dr. Benjamine Mola for flare.  Please follow up with your eye doctor, see if they can see you sooner.  I will place a referral for dermatology.  You should get a call to get an appointment scheduled.  Follow up with Dr. Lamonte Sakai or Judson Roch NP in 2 months. Next CT Chest due 11/2022 Please contact office for sooner follow up if symptoms do not improve or worsen or seek emergency care

## 2022-03-08 NOTE — ED Provider Triage Note (Signed)
Emergency Medicine Provider Triage Evaluation Note  Indalecio Malmstrom , a 52 y.o. male  was evaluated in triage.  Pt complains of pain to the right third toe after an injury yesterday.  Reports he accidentally smashed his foot against a brick and injured that toe.  With acute pain.  Denies numbness or tingling.  Review of Systems  Positive:  Negative: See above  Physical Exam  BP 125/88   Pulse 74   Temp 98.2 F (36.8 C)   Resp 16   Ht '5\' 8"'$  (1.727 m)   Wt 108.9 kg   SpO2 96%   BMI 36.49 kg/m  Gen:   Awake, no distress   Resp:  Normal effort  MSK:   Moves extremities without difficulty  Other:  Mild tenderness of right third toe.  No significant bony deformity, discoloration or malrotation appreciated.  Able to move toes.  Medical Decision Making  Medically screening exam initiated at 7:05 PM.  Appropriate orders placed.  [name] was informed that the remainder of the evaluation will be completed by another provider, this initial triage assessment does not replace that evaluation, and the importance of remaining in the ED until their evaluation is complete.  Imaging ordered.   Prince Rome, PA-C 34/74/25 [id number]

## 2022-03-08 NOTE — Discharge Instructions (Signed)
As we discussed, your workup in the ER today was reassuring for acute findings.  X-ray imaging of your foot did not reveal any fracture or dislocation.  I have given you a postop shoe for you to wear as needed for management of your pain.  I also recommend that you buddy tape your toes together for additional support.  Please rest, ice, compress, and elevate your foot and take Tylenol/ibuprofen as needed.  Follow-up with your primary care doctor as needed.  Return if development of any new or worsening symptoms.

## 2022-03-09 ENCOUNTER — Telehealth: Payer: Self-pay | Admitting: Acute Care

## 2022-03-09 DIAGNOSIS — D869 Sarcoidosis, unspecified: Secondary | ICD-10-CM

## 2022-03-09 NOTE — Telephone Encounter (Signed)
PT was told to make appt w/eye Dr. By Ms. Elie Confer. But they say they need a referral from her.  Eye Dr. Is [address] [place], [place]  [facility] [phone]

## 2022-03-09 NOTE — Telephone Encounter (Signed)
Referral to ophthalmology has been placed. Nothing further needed.

## 2022-03-10 ENCOUNTER — Telehealth: Payer: Self-pay | Admitting: Internal Medicine

## 2022-03-10 LAB — ANGIOTENSIN CONVERTING ENZYME: Angiotensin-Converting Enzyme: 17 U/L (ref 9–67)

## 2022-03-10 NOTE — Telephone Encounter (Signed)
I don't believe repeating a course of prednisone would be a major problem to take while on his current methotrexate. Did pulmonology recommend or plan on a particular prescription? If taking 20 mg or higher daily would limit to 5 days or less before stopping or tapering dose.

## 2022-03-10 NOTE — Telephone Encounter (Signed)
Per pulmonary office note on 03/08/2022:  He was seen for suspected sarcoid his flare by Dr. Vernelle Emerald, his rheumatologist. He increased his methotrexate on 02/27/2022 to 20 mg per week on Wednesdays. I considered adding prednisone, but I will not add prednisone as he has just had an increase in his methotrexate, and it can make side effects of methotrexate worse, and it affects his blood sugars so significantly. I have encouraged him to follow up with his rheumatologist.

## 2022-03-10 NOTE — Telephone Encounter (Signed)
Pt requesting to speak with Dr. Benjamine Mola. Musab stated he currently has a rash around his eyes and nose and his Pulmonary doctor recommended Prednisol for treatment. The doctor would like to confirm with Dr. Benjamine Mola if this medication will cause a reaction with his other. He can be reached at [phone].

## 2022-03-13 ENCOUNTER — Emergency Department (HOSPITAL_COMMUNITY)
Admission: EM | Admit: 2022-03-13 | Discharge: 2022-03-14 | Discharge status: Against Medical Advice | Payer: Commercial Managed Care - HMO | Attending: Emergency Medicine | Admitting: Emergency Medicine

## 2022-03-13 ENCOUNTER — Telehealth: Payer: Self-pay | Admitting: Internal Medicine

## 2022-03-13 ENCOUNTER — Other Ambulatory Visit: Payer: Self-pay

## 2022-03-13 DIAGNOSIS — Z5321 Procedure and treatment not carried out due to patient leaving prior to being seen by health care provider: Secondary | ICD-10-CM | POA: Insufficient documentation

## 2022-03-13 DIAGNOSIS — K0889 Other specified disorders of teeth and supporting structures: Secondary | ICD-10-CM | POA: Diagnosis not present

## 2022-03-13 DIAGNOSIS — D869 Sarcoidosis, unspecified: Secondary | ICD-10-CM

## 2022-03-13 MED ORDER — FUROSEMIDE 20 MG PO TABS: 10.0000 mg | ORAL_TABLET | Freq: Every day | ORAL | 2 refills | Status: DC

## 2022-03-13 MED ORDER — PREDNISONE 5 MG PO TABS: ORAL_TABLET | ORAL | 0 refills | Status: AC

## 2022-03-13 NOTE — Telephone Encounter (Signed)
Per last ov with Dr. Ali Lowe 02/27/22:  5. Dyspnea: I think this is mostly pulmonary in nature. However we will trial him on low-dose diuretic to see if this helps. Will prescribe Lasix 10 mg daily. The patient will call us back if this has been helpful we will prescribe this then long-term.    Called the patient and confirmed he has been taking 10 mg daily and it is helping his breathing. Refilled lasix 10 mg daily per notes.

## 2022-03-13 NOTE — Telephone Encounter (Signed)
Patient called stating he left a message on Friday, 03/10/22 because his pulmonologist wanted him to follow-up with Dr. Benjamine Mola and he hasn't received a return call.  Patient states he is in a lot of pain.

## 2022-03-13 NOTE — Telephone Encounter (Signed)
  Pt c/o medication issue:  1. Name of Medication: furosemide (LASIX) 20 MG tablet   2. How are you currently taking this medication (dosage and times per day)?   Take 0.5 tablets (10 mg total) by mouth daily.    3. Are you having a reaction (difficulty breathing--STAT)? No   4. What is your medication issue? Pt said, he was advise by Dr. Ali Lowe to give an update about taking this medication. He said, it is working well and want to know if he needs to continue taking this medication

## 2022-03-13 NOTE — ED Triage Notes (Signed)
Patient reports persistent left lower molar pain with swelling for several days .

## 2022-03-13 NOTE — Telephone Encounter (Signed)
I spoke with Nathan [name] I recommended it is okay to take limited duration of oral prednisone along with methotrexate. Sounds like some increase in cutaneous sarcoid rash, based on last exam had mildly raised papular rash noted. Will send Rx for prednisone taper 12 days starting 20 mg daily. Agree with him trying to see dermatology as well for topical management recommendation.

## 2022-03-13 NOTE — Telephone Encounter (Signed)
Patient calling to follow up from phone call on 03/10/2022. Please advise.

## 2022-03-13 NOTE — ED Provider Triage Note (Signed)
Emergency Medicine Provider Triage Evaluation Note  Nathan [name] , a 52 y.o. male  was evaluated in triage.  Pt complains of possible dental abscess.  Was seen in [facility] ED on 02/20/2022, was given antibiotic, noted improvement with the antibiotic, however finished it.  Over the last 24 hours, has noted increased pain and swelling of the area.  Has been unable to get in touch with a dentist regarding this.  Denies fever, chest pain, difficulty swallowing.  Review of Systems  Positive:  Negative: See above  Physical Exam  There were no vitals taken for this visit. Gen:   Awake, no distress   Resp:  Normal effort  MSK:   Moves extremities without difficulty  Other:  Mild tenderness to the left mid mandible with minimal swelling.  No clear dental abscess or significant gingivitis.  Airway patent.  Uvula midline.  Tongue without swelling.  No cardiac murmur.  Medical Decision Making  Medically screening exam initiated at 7:31 PM.  Appropriate orders placed.  [name] was informed that the remainder of the evaluation will be completed by another provider, this initial triage assessment does not replace that evaluation, and the importance of remaining in the ED until their evaluation is complete.     Prince Rome, [place] 83/35/82 [id number]

## 2022-03-14 ENCOUNTER — Other Ambulatory Visit (HOSPITAL_COMMUNITY): Payer: Self-pay

## 2022-03-14 ENCOUNTER — Telehealth: Payer: Self-pay | Admitting: Acute Care

## 2022-03-14 ENCOUNTER — Telehealth: Payer: Self-pay | Admitting: *Deleted

## 2022-03-14 ENCOUNTER — Other Ambulatory Visit: Payer: Self-pay

## 2022-03-14 MED ORDER — BREZTRI AEROSPHERE 160-9-4.8 MCG/ACT IN AERO: 2.0000 | INHALATION_SPRAY | Freq: Two times a day (BID) | RESPIRATORY_TRACT | 1 refills | Status: DC

## 2022-03-14 NOTE — Telephone Encounter (Signed)
Please call patient and let him know his ACE level was WNL at 17, so this does not appear to be a sarcoid flare. Have him follow up as scheduled. Thanks so much

## 2022-03-14 NOTE — Telephone Encounter (Signed)
I am okay with that. Celebrex can slightly increase the effective dose of methotrexate but does not need any extra monitoring than we are already doing. Thanks.

## 2022-03-14 NOTE — Telephone Encounter (Signed)
I red PT Ms. Gershon Cull comments on his lab work. He would still ike a nurse to call him back @ [phone]. Thank you.

## 2022-03-14 NOTE — Telephone Encounter (Signed)
Patient contacted the office and left message stating he saw his PCP. Patient states his PCP wants to put him back on Celebrex. Patient states her PCP wants to know if it would be okay with the MTX. Call back number [phone]

## 2022-03-14 NOTE — Telephone Encounter (Signed)
Called patient and advised patient that per Dr. Benjamine Mola,  I am okay with that. Celebrex can slightly increase the effective dose of methotrexate but does not need any extra monitoring than we are already doing.  Patient verbalized understanding.

## 2022-03-14 NOTE — Telephone Encounter (Signed)
Spoke with pt who raised the question that if this isn't sarcoid flare then what could it be? Pt is not yet established with dermatology. Pt states he was given prednisone by rheumatologist but has not started it yet. [name] please advise.

## 2022-03-14 NOTE — Telephone Encounter (Signed)
ATC x1.  No answer.  LVM to return call.

## 2022-03-15 NOTE — Telephone Encounter (Signed)
Spoke with pt and reviewed [employer]. Referral was sent on 03/09/22 to [facility] for pt. Pt states understanding. Nothing further needed at this time.

## 2022-03-16 ENCOUNTER — Other Ambulatory Visit (HOSPITAL_COMMUNITY): Payer: Self-pay

## 2022-03-16 ENCOUNTER — Telehealth: Payer: Self-pay | Admitting: Acute Care

## 2022-03-16 NOTE — Telephone Encounter (Signed)
Error. Closing encounter.

## 2022-03-24 ENCOUNTER — Telehealth: Payer: Self-pay | Admitting: Acute Care

## 2022-03-24 NOTE — Telephone Encounter (Signed)
When I spoke with the patient. He states he brought it in about 2 weeks ago, he stated he handed it to the front desk. I will attach Devki to this to see if she had received the paperwork

## 2022-03-24 NOTE — Telephone Encounter (Signed)
[name] did you sign patient assistance paperwork for Nathan [name]?

## 2022-03-29 ENCOUNTER — Telehealth: Payer: Self-pay | Admitting: Acute Care

## 2022-03-29 NOTE — Telephone Encounter (Signed)
Application had been signed by SG 03/14/22 and was faxed by nurse but it was faxed without having the physician's form with the prescription information fully filled out (this can be viewed in the media tab of pt's chart at date 03/22/22).   I have printed out the prior application and filled out the prescription information of the prescriber's form and have refaxed it to [employer]. Fax confirmation received.   Called and spoke with pt letting him know what happened but let him know that I had taken care of filling out the application and got it refaxed for him and he verbalized understanding. Nothing further needed.

## 2022-03-30 ENCOUNTER — Telehealth: Payer: Self-pay

## 2022-03-30 NOTE — Telephone Encounter (Signed)
Received a call from patient stating that the prednisone 5 mg is working for him.

## 2022-03-31 ENCOUNTER — Telehealth: Payer: Self-pay | Admitting: Internal Medicine

## 2022-03-31 DIAGNOSIS — D869 Sarcoidosis, unspecified: Secondary | ICD-10-CM

## 2022-03-31 NOTE — Telephone Encounter (Unsigned)
Patient called requesting a return call to let him know if he should continue taking Prednisone.  Patient states he finished his prescription yesterday and requested the refill be sent to [facility] at [facility].

## 2022-03-31 NOTE — Telephone Encounter (Signed)
Patient states you discussed with him at his last office visit putting him on a  low dose prednisone daily, patient is doing well. Please advise.

## 2022-04-03 ENCOUNTER — Telehealth: Payer: Self-pay | Admitting: Emergency Medicine

## 2022-04-03 MED ORDER — PREDNISONE 5 MG PO TABS: 5.0000 mg | ORAL_TABLET | Freq: Every day | ORAL | 2 refills | Status: DC

## 2022-04-03 NOTE — Telephone Encounter (Signed)
I called patient, patient verbalized understanding. 

## 2022-04-03 NOTE — Telephone Encounter (Signed)
Sent new Rx for prednisone 5 mg daily. We can recheck how he is doing at next follow up in April about continuing or dose adjustment.

## 2022-04-04 MED ORDER — BREZTRI AEROSPHERE 160-9-4.8 MCG/ACT IN AERO: 2.0000 | INHALATION_SPRAY | Freq: Two times a day (BID) | RESPIRATORY_TRACT | 0 refills | Status: DC

## 2022-04-04 NOTE — Telephone Encounter (Signed)
I called and spoke with the pt and notified 2 samples of Breztri up front for pick up  Nothing further needed

## 2022-04-21 ENCOUNTER — Ambulatory Visit (INDEPENDENT_AMBULATORY_CARE_PROVIDER_SITE_OTHER): Payer: Medicaid Other | Admitting: Emergency Medicine

## 2022-04-21 ENCOUNTER — Encounter: Payer: Self-pay | Admitting: Emergency Medicine

## 2022-04-21 VITALS — BP 142/82 | HR 94 | Temp 99.3°F | Ht 68.0 in | Wt 237.8 lb

## 2022-04-21 DIAGNOSIS — D869 Sarcoidosis, unspecified: Secondary | ICD-10-CM | POA: Diagnosis not present

## 2022-04-21 DIAGNOSIS — J4520 Mild intermittent asthma, uncomplicated: Secondary | ICD-10-CM | POA: Diagnosis not present

## 2022-04-21 MED ORDER — SULFAMETHOXAZOLE-TRIMETHOPRIM 800-160 MG PO TABS: 1.0000 | ORAL_TABLET | ORAL | 2 refills | Status: DC

## 2022-04-21 NOTE — Assessment & Plan Note (Signed)
He has cutaneous manifestations, joint manifestations.  His PET scan did not show any evidence of cardiac sarcoid.  Most recent ACE level was 17.  Needs a repeat CT chest in October to follow for any interval change.  Currently managed on methotrexate 20 mg, prednisone 5 mg.  I will add prophylactic Bactrim.   Continue methotrexate 20 mg weekly as managed by Dr. Benjamine Mola Continue prednisone 5 mg daily Please start Bactrim DS 160 mg on Monday Wednesday Friday.  This is a preventative antibiotic. Agree with follow-up with dermatology Continue to follow with cardiology, ophthalmology and rheumatology as you have been doing Follow with S Groce in 3 months to assess your status.  Follow Dr. Lamonte Sakai in October after your CT chest so we can review the results together.  Call sooner if you have any problems

## 2022-04-21 NOTE — Progress Notes (Signed)
Subjective:    Patient ID: Nathan [name], male    DOB: 12/12/1970, 52 y.o.   MRN: [id number]  HPI  ROV 12/07/21 --52 year old man with history of former tobacco use and sarcoidosis with pulmonary and cutaneous manifestations.  He has severe obstructive lung disease.  Also with GERD, chronic rhinitis.  Last seen in July. He was evaluated by cards and cardiac MRI questioned whether there could be cardiac sarcoid - he is planning for cardiac PET at [facility], not yet arranged.  He is dealing with a lot of joint pain. No new rash or breathing trouble. He was just started on MTX by Dr Benjamine Mola w Rheumatology.  We had attempted to restart his Symbicort last time, but he was unable to get get assistance so he is not on it. He has fairly good functional capacity, but can be limited by dyspnea. Minimal cough or wheeze. Uses albuterol  afew times a week.   CT scan of the chest performed 11/22/2021 reviewed by me shows stable mediastinal and hilar adenopathy, bilateral bandlike opacities and some scattered pulmonary nodules are all stable.  1.1 x 0.8 cm left upper lobe nodular opacity is smaller compared with prior.  No evidence of active sarcoidosis  ROV 04/21/22 --Nathan [name] is 52, former tobacco use, history of sarcoidosis with pulmonary and cutaneous manifestations.  He has associated severe obstructive lung disease.  Has been managed by Dr. Benjamine Mola with rheumatology on methotrexate, has been dealing with skin changes around the eyes, joint disease.  There was some question of cardiac sarcoidosis on MRI 11/16/2021 prompting a cardiac PET scan 02/10/2022 that did not show any abnormal metabolism to indicate an active inflammatory process in the myocardium, no evidence of cardiac sarcoid.  He has been having progressive shortness of breath and some chest heaviness and was started on Breztri 03/08/2022, has required patient's assistance paperwork which is all still pending.  He was also referred to dermatology at that time  - hasn't seen them yet.  ACE level was 17.  Given the more active skin disease, joint disease his methotrexate is now up to 20 mg weekly, now on prednisone 5 mg daily.  He reports that the Judithann Sauger has been beneficial, he wants to continue it.     Review of Systems As per HPI     Objective:   Physical Exam Vitals:   04/21/22 0851  BP: (!) 142/82  Pulse: 94  Temp: 99.3 F (37.4 C)  TempSrc: Oral  SpO2: 96%  Weight: 237 lb 12.8 oz (107.9 kg)  Height: '5\' 8"'$  (1.727 m)   Gen: Pleasant, well-nourished, in no distress,  normal affect  ENT: No lesions, few raised areas in the periorbital region bilaterally, single raised area on the back of his neck.  No abnormalities oropharynx, pupils equal  Neck: No JVD, no stridor  Lungs: No use of accessory muscles, no crackles or wheezing on normal respiration, no wheeze on forced expiration  Cardiovascular: RRR, heart sounds normal, no murmur or gallops, no peripheral edema  Musculoskeletal: No deformities, no cyanosis or clubbing  Neuro: alert, awake, non focal  Skin: Skin involvement as above      Assessment & Plan:  Sarcoidosis He has cutaneous manifestations, joint manifestations.  His PET scan did not show any evidence of cardiac sarcoid.  Most recent ACE level was 17.  Needs a repeat CT chest in October to follow for any interval change.  Currently managed on methotrexate 20 mg, prednisone 5 mg.  I will  add prophylactic Bactrim.   Continue methotrexate 20 mg weekly as managed by Dr. Benjamine Mola Continue prednisone 5 mg daily Please start Bactrim DS 160 mg on Monday Wednesday Friday.  This is a preventative antibiotic. Agree with follow-up with dermatology Continue to follow with cardiology, ophthalmology and rheumatology as you have been doing Follow with S Groce in 3 months to assess your status.  Follow Dr. Lamonte Sakai in October after your CT chest so we can review the results together.  Call sooner if you have any  problems  Asthma Associated with his sarcoidosis.  He has benefited from the changed to [place] but we are working to try to get it for him reliably.  He is waiting for patient assistance information from the company.  We will repeat your CT scan of the chest without contrast in October 2024 to follow your sarcoidosis Please continue Breztri 2 puffs twice a day.  Rinse and gargle after using.  We will complete the patient assistance program to try to get financial assistance to cover this medication Keep albuterol available to use 2 puffs up to every 4 hours if needed for shortness of breath, chest tightness, wheezing.  Follow with S Groce in 3 months to assess your status.  Follow Dr. Lamonte Sakai in October after your CT chest so we can review the results together.  Call sooner if you have any problems  Baltazar Apo, MD, PhD 04/21/2022, 9:10 AM [facility] [phone] or if no answer [phone]

## 2022-04-21 NOTE — Assessment & Plan Note (Signed)
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  He has benefited from the changed to [place] but we are working to try to get it for him reliably.  He is waiting for patient assistance information from the company.  We will repeat your CT scan of the chest without contrast in October 2024 to follow your sarcoidosis Please continue Breztri 2 puffs twice a day.  Rinse and gargle after using.  We will complete the patient assistance program to try to get financial assistance to cover this medication Keep albuterol available to use 2 puffs up to every 4 hours if needed for shortness of breath, chest tightness, wheezing.  Follow with S Groce in 3 months to assess your status.  Follow Dr. Lamonte Sakai in October after your CT chest so we can review the results together.  Call sooner if you have any problems

## 2022-04-21 NOTE — Addendum Note (Signed)
Addended by: Gavin Potters R on: 04/21/2022 09:26 AM   Modules accepted: Orders

## 2022-04-21 NOTE — Patient Instructions (Addendum)
We will repeat your CT scan of the chest without contrast in October 2024 to follow your sarcoidosis Please continue Breztri 2 puffs twice a day.  Rinse and gargle after using.  We will complete the patient assistance program to try to get financial assistance to cover this medication Keep albuterol available to use 2 puffs up to every 4 hours if needed for shortness of breath, chest tightness, wheezing.  Continue methotrexate 20 mg weekly as managed by Dr. Benjamine Mola Continue prednisone 5 mg daily Please start Bactrim DS 160 mg on Monday Wednesday Friday.  This is a preventative antibiotic. Agree with follow-up with dermatology Continue to follow with cardiology, ophthalmology and rheumatology as you have been doing Follow with S Groce in 3 months to assess your status.  Follow Dr. Lamonte Sakai in October after your CT chest so we can review the results together.  Call sooner if you have any problems

## 2022-04-28 ENCOUNTER — Ambulatory Visit: Payer: Medicaid Other | Attending: Internal Medicine

## 2022-04-28 DIAGNOSIS — E7841 Elevated Lipoprotein(a): Secondary | ICD-10-CM

## 2022-04-28 DIAGNOSIS — I700 Atherosclerosis of aorta: Secondary | ICD-10-CM

## 2022-04-29 LAB — HEPATIC FUNCTION PANEL
ALT: 38 IU/L (ref 0–44)
AST: 35 IU/L (ref 0–40)
Albumin: 4.6 g/dL (ref 4.1–5.1)
Alkaline Phosphatase: 92 IU/L (ref 44–121)
Bilirubin Total: 0.7 mg/dL (ref 0.0–1.2)
Bilirubin, Direct: 0.21 mg/dL (ref 0.00–0.40)
Total Protein: 7.1 g/dL (ref 6.0–8.5)

## 2022-04-29 LAB — LIPID PANEL
Chol/HDL Ratio: 2.8 ratio (ref 0.0–5.0)
Cholesterol, Total: 95 mg/dL — ABNORMAL LOW (ref 100–199)
HDL: 34 mg/dL — ABNORMAL LOW (ref >39)
LDL Chol Calc (NIH): 46 mg/dL (ref 0–99)
Triglycerides: 66 mg/dL (ref 0–149)
VLDL Cholesterol Cal: 15 mg/dL (ref 5–40)

## 2022-05-01 ENCOUNTER — Telehealth: Payer: Self-pay | Admitting: Internal Medicine

## 2022-05-01 NOTE — Telephone Encounter (Signed)
Patient is calling back for his results 

## 2022-05-01 NOTE — Telephone Encounter (Signed)
Pt is requesting call back in regards to labs.  

## 2022-05-01 NOTE — Telephone Encounter (Signed)
Reviewed recent lab results with patient.  Adv to continue current medications.

## 2022-05-17 IMAGING — CR DG LUMBAR SPINE COMPLETE 4+V
5 series · 5 of 5 positions shown · non-contrast
Comparison: 07/10/2006

CLINICAL DATA: Chronic low back pain

EXAM:
LUMBAR SPINE - COMPLETE 4+ VIEW

[t lumbar spine ap]
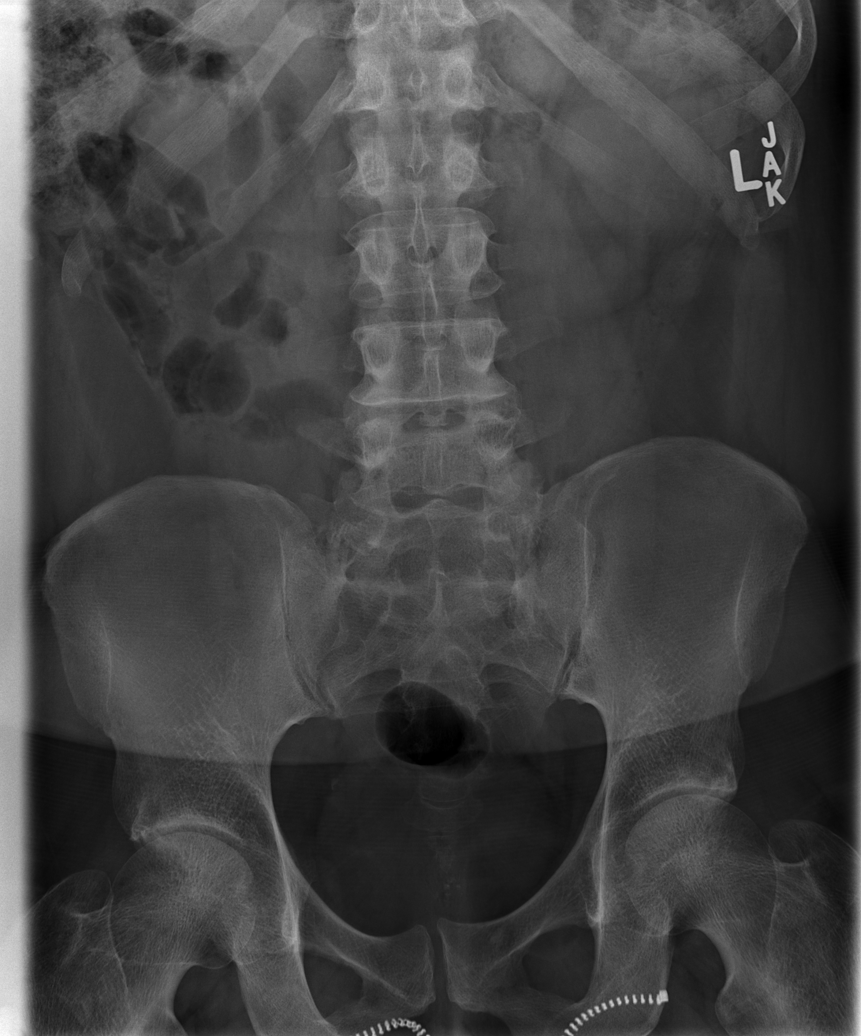

[t lumbar spine obl (1 of 2)]
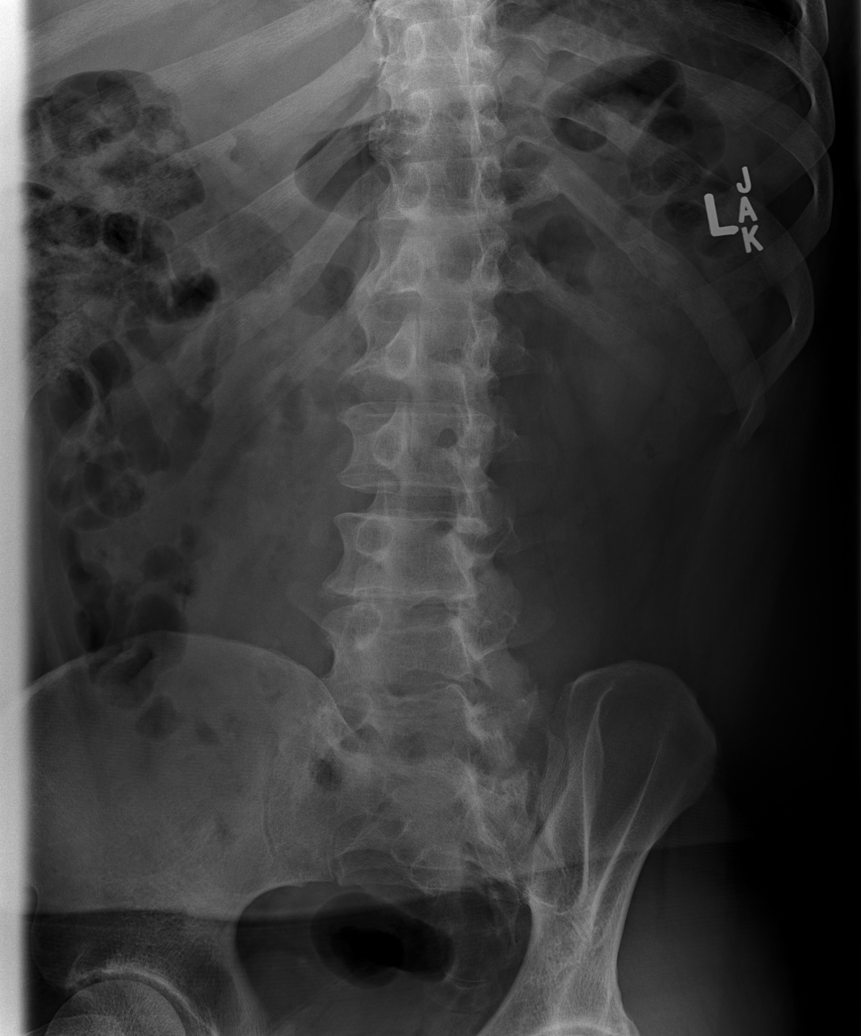

[t lumbar spine obl (2 of 2)]
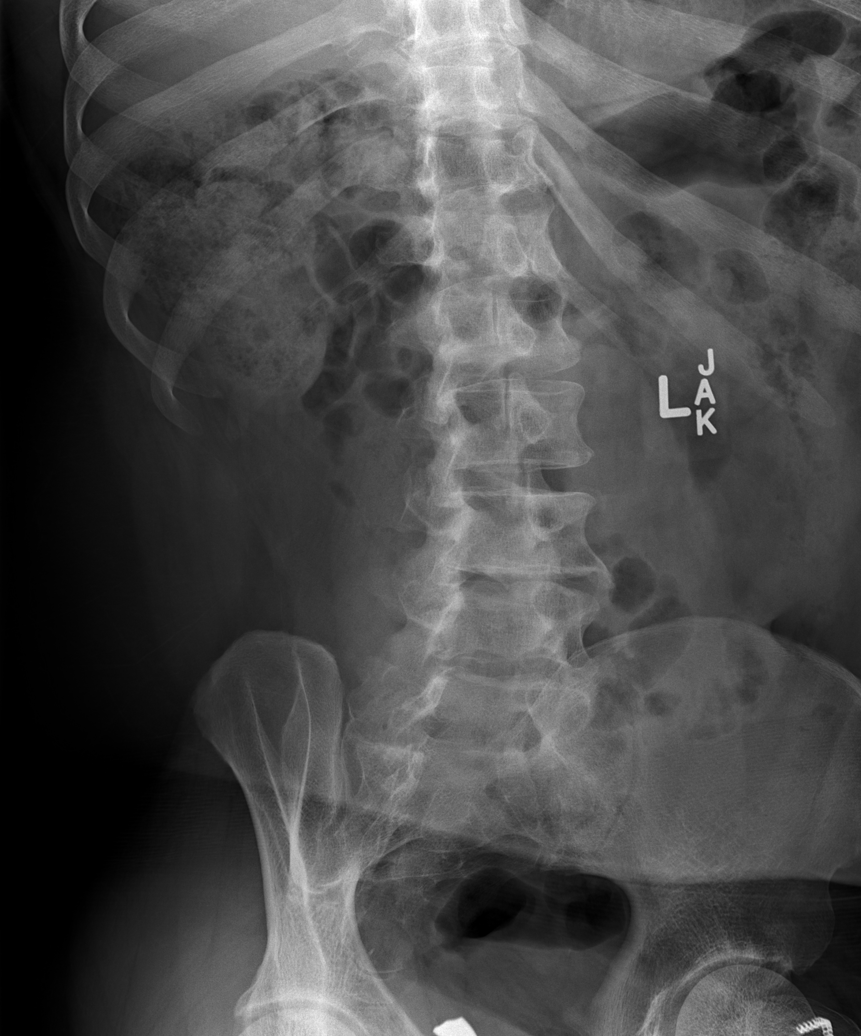

[t lumbar spine lat]
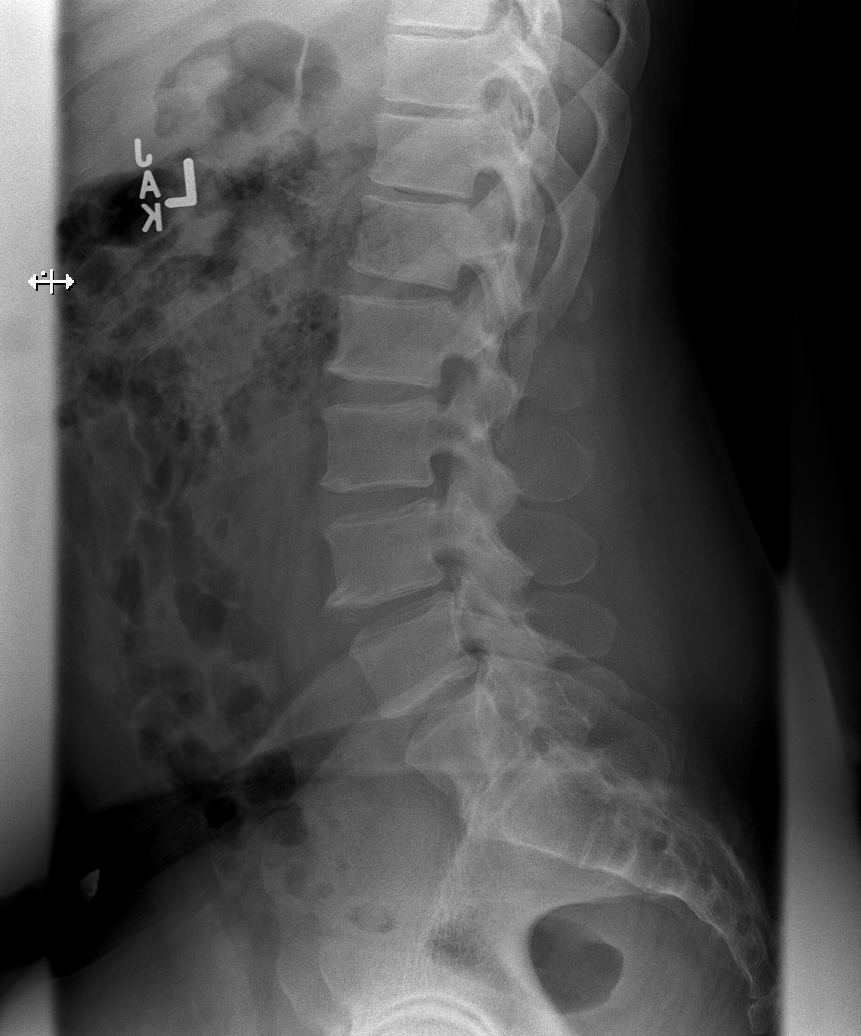

[t lumbar l-5 s-1 spot]
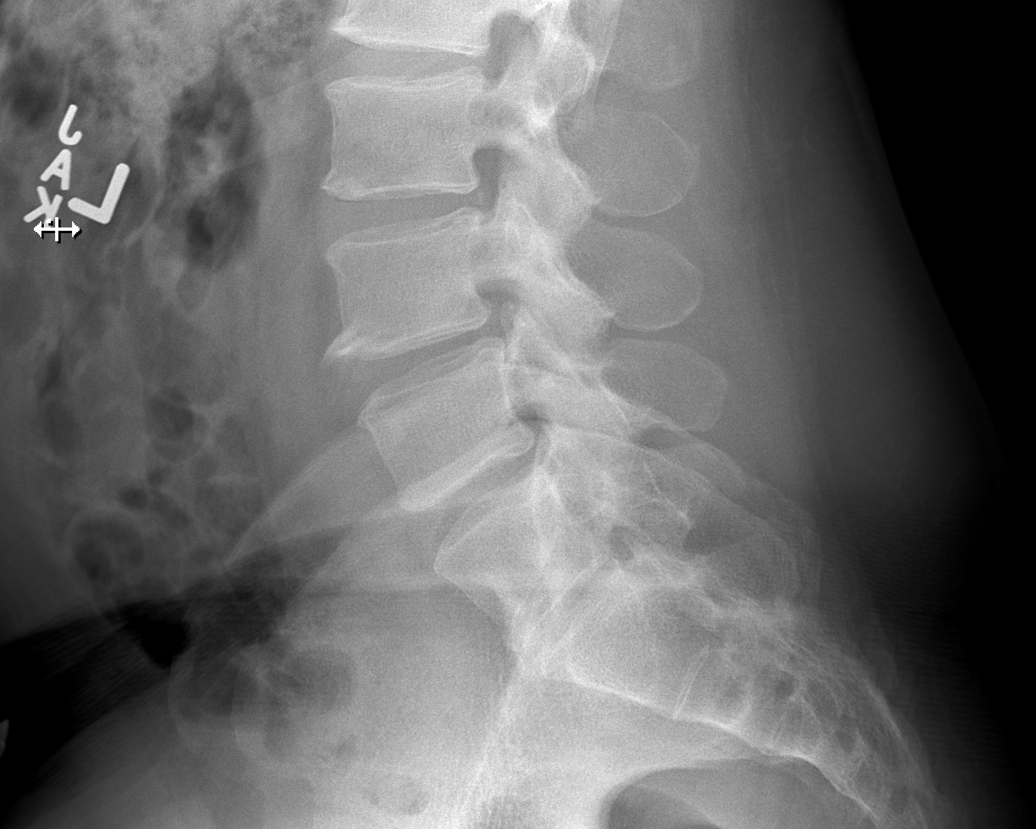

[5 of 5 positions shown; findings below may reference images not displayed]

FINDINGS: Lumbar alignment within normal limits. Similar disc space narrowing
at L5-S1 multiple level degenerative osteophytes with otherwise
maintained disc space. Vertebral body heights are maintained.
IMPRESSION: Degenerative changes, greatest at L5-S1.

## 2022-05-23 ENCOUNTER — Telehealth: Payer: Self-pay | Admitting: Emergency Medicine

## 2022-05-23 NOTE — Telephone Encounter (Signed)
Pt called the office wanting to speak to someone about his insurances. Pt has been trying to get approved for pt assistance for the [facility] but [employer] told pt that he was not approved as they were still showing that he has [employer] when pt said that he only has medicaid.   Pt wants to know how his insurances can be fixed.  Nathan [name], please advise if you are able to help out with this.

## 2022-05-23 NOTE — Telephone Encounter (Signed)
Pt called the office again stating that he has spoken to his insurance and they said that if a prior auth was done on his medicaid since that is the only insurance that he has, this should hopefully be approved so pt can get the [place] from the pharmacy.  Pt said if we need any help with getting this fixed for pt, we can call [phone].  Routing to prior [place] team.

## 2022-05-23 NOTE — Telephone Encounter (Signed)
PA has previously been denied by Medicaid, please see previous encounter for this medication.

## 2022-05-24 NOTE — Telephone Encounter (Signed)
PA has been DENIED due to :    Per your health plan's criteria, this drug is covered if you meet the following: All of the following: (A) One of the following: (i) You have failed at least one preferred drugs as confirmed by claims history or submission of medical records. The preferred drug: Advair Diskus, Advair HFA, and Dulera. (ii) You cannot use one preferred drug (please specify contraindication or intolerance). (B) The use of this drug is supported (for a Food and Drug Administration-approved indication or by an appropriate compendia of current literature): chronic bronchitis, chronic obstructive pulmonary disease (COPD), and emphysema.     Called and spoke with the pt and notified that PA was tried and denied before He states that he called them yesterday- [employer]Med[employer] and they told him it should be able to be approved  I called and initiated a new PA for Nathan [name]- case ID [id number]  Will await response   Pt aware will call him once determination has been made

## 2022-05-29 DIAGNOSIS — R1314 Dysphagia, pharyngoesophageal phase: Secondary | ICD-10-CM | POA: Insufficient documentation

## 2022-05-29 DIAGNOSIS — K219 Gastro-esophageal reflux disease without esophagitis: Secondary | ICD-10-CM | POA: Insufficient documentation

## 2022-05-30 ENCOUNTER — Ambulatory Visit: Payer: Medicaid Other | Attending: Internal Medicine | Admitting: Internal Medicine

## 2022-05-30 ENCOUNTER — Encounter: Payer: Self-pay | Admitting: Internal Medicine

## 2022-05-30 VITALS — BP 133/87 | HR 85 | Resp 16 | Ht 68.0 in | Wt 243.0 lb

## 2022-05-30 DIAGNOSIS — R058 Other specified cough: Secondary | ICD-10-CM | POA: Diagnosis not present

## 2022-05-30 DIAGNOSIS — D869 Sarcoidosis, unspecified: Secondary | ICD-10-CM

## 2022-05-30 DIAGNOSIS — Z79899 Other long term (current) drug therapy: Secondary | ICD-10-CM | POA: Diagnosis not present

## 2022-05-30 MED ORDER — PREDNISONE 5 MG PO TABS: 5.0000 mg | ORAL_TABLET | Freq: Every day | ORAL | 0 refills | Status: DC

## 2022-05-30 MED ORDER — METHOTREXATE SODIUM 2.5 MG PO TABS: 20.0000 mg | ORAL_TABLET | ORAL | 0 refills | Status: DC

## 2022-05-30 NOTE — Progress Notes (Unsigned)
Office Visit Note  Patient: Nathan [name]             Date of Birth: 07/12/1970           MRN: [id number]             PCP: [facility] Referring: [facility] Visit Date: 05/30/2022   Subjective:  Follow-up   History of Present Illness: Nathan [name] is a 52 y.o. male here for follow up for sarcoidosis with pulmonary articular and cutaneous disease involvement currently on methotrexate 20 mg p.o. weekly folic acid 1 mg daily and prednisone 5 mg daily.  Rash is somewhat improved more since her last visit.  Follow-up with pulmonology clinic not clear whether there is active disease plan for follow-up this summer with repeat lung study to assess this.  He saw the ENT doctor noticed excessive mucus on exam suspicious for reflux for his upper airway cough syndrome issue.  He saw spine specialist for his neck and upper back pain initially started low-dose Lyrica and tramadol while getting outside records and workup and has follow-up scheduled later this week.   Previous HPI 02/28/22 Nathan [name] is a 52 y.o. male here for follow up for sarcoidosis with pulmonary and cutaneous involvement on methotrexate 15 mg p.o. weekly and folic acid 1 mg daily.  Since her last visit he had PET scan that looked good no evidence of cardiac sarcoidosis activity.  CT scan in October with stable lymphadenopathy and bandlike and nodular opacities in the lungs, although the associated nondiagnostic CT in December was consistent with hypermetabolic mediastinal and hilar lymphadenopathy and bilateral pulmonary parenchymal opacities consistent with inflammatory process and active granulomatous disease.  He is tolerating the methotrexate without any difficulty.  Still been noticing some ongoing skin rash with minimally raised hyperpigmented spots along the medial border of the eyes.  He has not felt a significant improvement with his neck pain on the medication.  Still  feels restriction in his range of motion especially looking towards the right and gets pain painful experiences towards the left side.     Previous HPI 12/19/21 Nathan [name] is a 52 y.o. 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 He has not noticed any problems with the medication but also no improvement in symptoms so far. Still pain worst in the neck and upper back with radiation into back and with headaches. Asthma symptoms doing somewhat worse this year. He had cardiac MRI but is now recommended for cardiac PET scan to evaluate but not scheduled yet.   Previous HPI 11/15/2021 Nathan [name] is a 52 y.o. male here for joint pains with history of pulmonary and cutaneous sacoidosis.  His worst affected area is pretty constant pain in the base of the neck area frequently with radiation up the neck and posterior headaches associated with this.  He has had problems going on for years but seems to be progressively getting somewhat worse.  He does get some pain in extremities elsewhere but by far the most limiting for him is in the base of the neck.  He has tried multiple NSAIDs including meloxicam and Celebrex is also tried muscle relaxant medications none of which were very significantly beneficial.  He is tried physical therapy completing courses and had local injections that did not greatly relieve symptoms.  He  does have known multilevel degenerative back changes.  He had most recent neck MRI in 2022 based on findings had discussed surgery options but is looking to avoid this due to concern about complications or long-term needing additional revision. Original diagnosis of sarcoidosis was based on pulmonary imaging finding and treated with very prolonged prednisone taper.  Initially only had respiratory symptoms with some mild dyspnea and coughing that improved with the steroid  treatments.  More recently this year he had an episode of similar symptoms coming back and was treated with a round of steroids from pulmonology clinic in noticed a increase in blood glucose up to 400s.  He is also developed some new skin rashes on the face previously had involvement on extremities but no facial rashes with original symptoms.  He is prescribed topical medication by Dr. Jorja Loa but has not seen any difference so far.     Review of Systems  Constitutional:  Positive for fatigue.  HENT:  Positive for mouth dryness. Negative for mouth sores.   Eyes:  Positive for dryness.  Respiratory:  Positive for shortness of breath.   Cardiovascular:  Positive for chest pain and palpitations.  Gastrointestinal:  Negative for blood in stool, constipation and diarrhea.  Endocrine: Negative for increased urination.  Genitourinary:  Negative for involuntary urination.  Musculoskeletal:  Positive for joint pain, gait problem, joint pain, joint swelling, myalgias, muscle weakness, morning stiffness, muscle tenderness and myalgias.  Skin:  Positive for color change and sensitivity to sunlight. Negative for rash and hair loss.  Allergic/Immunologic: Negative for susceptible to infections.  Neurological:  Positive for dizziness and headaches.  Hematological:  Negative for swollen glands.  Psychiatric/Behavioral:  Positive for depressed mood and sleep disturbance. The patient is nervous/anxious.     PMFS History:  Patient Active Problem List   Diagnosis Date Noted   High risk medication use 11/15/2021   HLD (hyperlipidemia) 09/13/2021   Asthma 11/10/2020   COVID-19 virus infection 10/21/2019   Shoulder pain, left 10/23/2016   Other chest pain 08/22/2014   Upper airway cough syndrome 07/23/2014   Sarcoidosis 03/06/2011    Past Medical History:  Diagnosis Date   Arthritis    Asthma    'I think I might have asthma"   Chronic back pain    Dyspnea    chronic   Hyperlipidemia    Sarcoidosis      Family History  Problem Relation Age of Onset   Hypertension Mother    Diabetes Mother    Heart attack Father    Diabetes Father    Hypertension Father    Gout Father    Colon polyps Sister    Heart disease Paternal Uncle    Prostate cancer Paternal Uncle    Heart disease Maternal Grandfather    Anemia Daughter    Anemia Daughter    Colon cancer Neg Hx    Esophageal cancer Neg Hx    Rectal cancer Neg Hx    Stomach cancer Neg Hx    Past Surgical History:  Procedure Laterality Date   NO PAST SURGERIES     Social History   Social History Narrative   Not on file   Immunization History  Administered Date(s) Administered   Dtap, Unspecified 11/09/1972, 01/07/1973, 03/01/1973, 03/24/1974, 12/06/1977   Influenza,inj,Quad PF,6+ Mos 11/10/2020, 02/16/2022   Measles 09/23/1973   Mumps 03/24/1974   PFIZER Comirnaty(Gray Top)Covid-19 Tri-[place] Vaccine 10/28/2020   PFIZER(Purple Top)SARS-COV-2 Vaccination 10/27/2020   PNEUMOCOCCAL CONJUGATE-20 02/16/2022   Polio, Unspecified 11/09/1972,  01/07/1973, 03/11/1973, 03/24/1974, 12/06/1977   Rubella 09/23/1973   Tdap 02/16/2022     Objective: Vital Signs: BP 133/87 (BP Location: Left Arm, Patient Position: Sitting, Cuff Size: Normal)   Pulse 85   Resp 16   Ht 5\' 8"  (1.727 m)   Wt 243 lb (110.2 kg)   BMI 36.95 kg/m    Physical Exam Eyes:     Conjunctiva/sclera: Conjunctivae normal.  Cardiovascular:     Rate and Rhythm: Normal rate and regular rhythm.  Pulmonary:     Effort: Pulmonary effort is normal.     Breath sounds: Normal breath sounds.  Musculoskeletal:     Right lower leg: No edema.     Left lower leg: No edema.  Skin:    Findings: Rash present.     Comments: Hyperpigmented skin patches along medial border of both eyes and on right side of the nose, no raised papules  Neurological:     Mental Status: He is alert.  Psychiatric:        Mood and Affect: Mood normal.      Musculoskeletal Exam:  Neck  tenderness to pressure midline and paraspinal muscles near base of neck and some with lateral rotation either side Shoulders full ROM no tenderness or swelling Elbows full ROM no tenderness or swelling Wrists full ROM no tenderness or swelling Fingers full ROM no tenderness or swelling Knees full ROM no tenderness or swelling    Investigation: No additional findings.  Imaging: No results found.  Recent Labs: Lab Results  Component Value Date   WBC 7.6 05/30/2022   HGB 13.7 05/30/2022   PLT 273 05/30/2022   NA 141 05/30/2022   K 4.6 05/30/2022   CL 101 05/30/2022   CO2 28 05/30/2022   GLUCOSE 104 05/30/2022   BUN 19 05/30/2022   CREATININE 0.82 05/30/2022   BILITOT 0.9 05/30/2022   ALKPHOS 92 04/28/2022   AST 26 05/30/2022   ALT 29 05/30/2022   PROT 7.7 05/30/2022   ALBUMIN 4.6 04/28/2022   CALCIUM 9.6 05/30/2022   GFRAA >60 09/17/2019    Speciality Comments: No specialty comments available.  Procedures:  No procedures performed Allergies: Tizanidine, Hydrocodone-acetaminophen, Oxycodone-acetaminophen, Percocet [oxycodone-acetaminophen], and Vicodin [hydrocodone-acetaminophen]   Assessment / Plan:     Visit Diagnoses: Sarcoidosis - Plan: Sedimentation rate, predniSONE (DELTASONE) 5 MG tablet, methotrexate (RHEUMATREX) 2.5 MG tablet  Skin disease looks clinically improved on the current treatment.  Not having any clear symptoms for pulmonary disease has ongoing follow-up planned with repeat imaging I recommend we plan to avoid any medication tapering until seeing this result.  Rechecking sedimentation rate for inflammatory disease activity assessment.  Plan to continue methotrexate 20 mg p.o. weekly folic acid 1 mg daily and prednisone 5 mg daily.  High risk medication use - Plan: CBC with Differential/Platelet, COMPLETE METABOLIC PANEL WITH GFR  Checking CBC and CMP for medication monitoring with treatment on methotrexate.  No significant interval infections or  medication intolerance.  Upper airway cough syndrome    Orders: Orders Placed This Encounter  Procedures   Sedimentation rate   CBC with Differential/Platelet   COMPLETE METABOLIC PANEL WITH GFR   Meds ordered this encounter  Medications   predniSONE (DELTASONE) 5 MG tablet    Sig: Take 1 tablet (5 mg total) by mouth daily with breakfast.    Dispense:  90 tablet    Refill:  0   methotrexate (RHEUMATREX) 2.5 MG tablet    Sig: Take 8 tablets (20 mg total)  by mouth once a week. Caution:Chemotherapy. Protect from light.    Dispense:  104 tablet    Refill:  0     Follow-Up Instructions: Return in about 3 months (around 08/29/2022) for Sarcoid on MTX/GC f/u 3mos.   Fuller Plan, MD  Note - This record has been created using [employer].  Chart creation errors have been sought, but may not always  have been located. Such creation errors do not reflect on  the standard of medical care.

## 2022-05-31 LAB — COMPLETE METABOLIC PANEL WITH GFR
AG Ratio: 1.8 (calc) (ref 1.0–2.5)
ALT: 29 U/L (ref 9–46)
AST: 26 U/L (ref 10–40)
Albumin: 4.9 g/dL (ref 3.6–5.1)
Alkaline phosphatase (APISO): 73 U/L (ref 36–130)
BUN: 19 mg/dL (ref 7–25)
CO2: 28 mmol/L (ref 20–32)
Calcium: 9.6 mg/dL (ref 8.6–10.3)
Chloride: 101 mmol/L (ref 98–110)
Creat: 0.82 mg/dL (ref 0.60–1.29)
Globulin: 2.8 g/dL (calc) (ref 1.9–3.7)
Glucose, Bld: 104 mg/dL (ref 65–139)
Potassium: 4.6 mmol/L (ref 3.5–5.3)
Sodium: 141 mmol/L (ref 135–146)
Total Bilirubin: 0.9 mg/dL (ref 0.2–1.2)
Total Protein: 7.7 g/dL (ref 6.1–8.1)
eGFR: 108 mL/min/{1.73_m2} (ref >=60)

## 2022-05-31 LAB — CBC WITH DIFFERENTIAL/PLATELET
Absolute Monocytes: 494 cells/uL (ref 200–950)
Basophils Absolute: 23 cells/uL (ref 0–200)
Basophils Relative: 0.3 %
Eosinophils Absolute: 53 cells/uL (ref 15–500)
Eosinophils Relative: 0.7 %
HCT: 41.7 % (ref 38.5–50.0)
Hemoglobin: 13.7 g/dL (ref 13.2–17.1)
Lymphs Abs: 912 cells/uL (ref 850–3900)
MCH: 28.3 pg (ref 27.0–33.0)
MCHC: 32.9 g/dL (ref 32.0–36.0)
MCV: 86.2 fL (ref 80.0–100.0)
MPV: 10.3 fL (ref 7.5–12.5)
Monocytes Relative: 6.5 %
Neutro Abs: 6118 cells/uL (ref 1500–7800)
Neutrophils Relative %: 80.5 %
Platelets: 273 10*3/uL (ref 140–400)
RBC: 4.84 10*6/uL (ref 4.20–5.80)
RDW: 14.5 % (ref 11.0–15.0)
Total Lymphocyte: 12 %
WBC: 7.6 10*3/uL (ref 3.8–10.8)

## 2022-05-31 LAB — SEDIMENTATION RATE: Sed Rate: 6 mm/h (ref 0–15)

## 2022-05-31 NOTE — Telephone Encounter (Signed)
Has there been a determination received on the PA I started on 05/24/22? I repeated it bc pt insisted that we try and re-submit. Thank you!

## 2022-06-02 NOTE — Telephone Encounter (Signed)
Provider can contact plan for an appeal, we cannot resubmit when there is already a denial on file.

## 2022-06-05 NOTE — Progress Notes (Signed)
Sedimentation rate is normal at 6. Blood count and metabolic panel are normal no problems for continuing methotrexate.

## 2022-06-09 ENCOUNTER — Other Ambulatory Visit: Payer: Self-pay | Admitting: Otolaryngology

## 2022-06-09 DIAGNOSIS — R1319 Other dysphagia: Secondary | ICD-10-CM

## 2022-06-19 ENCOUNTER — Ambulatory Visit
Admission: RE | Admit: 2022-06-19 | Discharge: 2022-06-19 | Disposition: A | Discharge status: Home / Self Care | Payer: Medicaid Other | Source: Ambulatory Visit | Attending: Otolaryngology | Admitting: Otolaryngology

## 2022-06-19 ENCOUNTER — Other Ambulatory Visit: Payer: Self-pay | Admitting: Otolaryngology

## 2022-06-19 DIAGNOSIS — R1319 Other dysphagia: Secondary | ICD-10-CM

## 2022-06-21 ENCOUNTER — Telehealth: Payer: Self-pay | Admitting: Internal Medicine

## 2022-06-21 NOTE — Telephone Encounter (Signed)
Pt would like a callback regarding questioning if he should continue to take his current medications advised since his last office visit in January. Please advise

## 2022-06-21 NOTE — Telephone Encounter (Signed)
Returned call to patient.  He called to see if he had an appointment scheduled and when he did not, he wanted to know if he should continue the meds Dr. Lynnette Caffey prescribed (lasix, atorvastatin and ezetimibe).    Adv to continue medications and scheduled his follow up per his recall with APP in September.

## 2022-06-26 ENCOUNTER — Other Ambulatory Visit: Payer: Self-pay | Admitting: *Deleted

## 2022-06-26 ENCOUNTER — Telehealth: Payer: Self-pay | Admitting: Internal Medicine

## 2022-06-26 DIAGNOSIS — D869 Sarcoidosis, unspecified: Secondary | ICD-10-CM

## 2022-06-26 NOTE — Telephone Encounter (Signed)
RX sent for 90 day 05/30/2022, I called patient.

## 2022-06-26 NOTE — Telephone Encounter (Signed)
Nathan [name] called requesting a refill for Prednisone to the pharmacy on file. He can be reached at [phone].

## 2022-07-11 ENCOUNTER — Other Ambulatory Visit: Payer: Self-pay | Admitting: Emergency Medicine

## 2022-08-27 ENCOUNTER — Ambulatory Visit (HOSPITAL_COMMUNITY)
Admission: EM | Admit: 2022-08-27 | Discharge: 2022-08-27 | Disposition: A | Discharge status: Home / Self Care | Payer: Medicaid Other | Attending: Emergency Medicine | Admitting: Emergency Medicine

## 2022-08-27 ENCOUNTER — Other Ambulatory Visit: Payer: Self-pay

## 2022-08-27 ENCOUNTER — Encounter (HOSPITAL_COMMUNITY): Payer: Self-pay | Admitting: Emergency Medicine

## 2022-08-27 DIAGNOSIS — K0889 Other specified disorders of teeth and supporting structures: Secondary | ICD-10-CM | POA: Diagnosis not present

## 2022-08-27 DIAGNOSIS — K047 Periapical abscess without sinus: Secondary | ICD-10-CM

## 2022-08-27 MED ORDER — AMOXICILLIN-POT CLAVULANATE 875-125 MG PO TABS: 1.0000 | ORAL_TABLET | Freq: Two times a day (BID) | ORAL | 0 refills | Status: AC

## 2022-08-27 NOTE — Progress Notes (Signed)
Office Visit Note  Patient: Nathan [name]             Date of Birth: 1970-03-02           MRN: [id number]             PCP: [facility] Referring: [facility] Visit Date: 08/29/2022   Subjective:  Follow-up   History of Present Illness: Nathan [name] is a 52 y.o. male here for follow up for sarcoidosis with pulmonary, articular, and cutaneous disease involvement currently on methotrexate 20 mg p.o. weekly folic acid 1 mg daily and prednisone 5 mg daily.  Skin rashes are doing well since her last visit.  [facility] for ongoing neck and upper back pain with trial of steroid injection to the neck with partial benefit.  He is also been recommended if effectiveness is low for possible trigger point injection at multiple sites in the neck and upper back.  He had increase in cholesterol medication with his PCP onto a atorvastatin and ezetimibe noticing some increased muscle pain and cramping so discontinued the ezetimibe for about 1 week.  At that visit on July 8 had an elevated CK level of 662.  Outside of these areas joint pain is doing well and not noticed any significant swelling in his arms or legs.  Still has some persistent cough swallowing difficulty and shortness of breath with exertion plans for upcoming repeat CT scan and pulmonology follow-up.  He is also been referred for seeing gastroenterology based on previous findings of laryngeal pharyngeal reflux disease.    Previous HPI 05/30/22 Nathan [name] is a 52 y.o. male here for follow up for sarcoidosis with pulmonary articular and cutaneous disease involvement currently on methotrexate 20 mg p.o. weekly folic acid 1 mg daily and prednisone 5 mg daily.  Rash is somewhat improved more since her last visit.  Follow-up with pulmonology clinic not clear whether there is active disease plan for follow-up this summer with repeat lung study to assess this.  He  saw the ENT doctor noticed excessive mucus on exam suspicious for reflux for his upper airway cough syndrome issue.  He saw spine specialist for his neck and upper back pain initially started low-dose Lyrica and tramadol while getting outside records and workup and has follow-up scheduled later this week.     Previous HPI 02/28/22 Nathan [name] is a 52 y.o. male here for follow up for sarcoidosis with pulmonary and cutaneous involvement on methotrexate 15 mg p.o. weekly and folic acid 1 mg daily.  Since her last visit he had PET scan that looked good no evidence of cardiac sarcoidosis activity.  CT scan in October with stable lymphadenopathy and bandlike and nodular opacities in the lungs, although the associated nondiagnostic CT in December was consistent with hypermetabolic mediastinal and hilar lymphadenopathy and bilateral pulmonary parenchymal opacities consistent with inflammatory process and active granulomatous disease.  He is tolerating the methotrexate without any difficulty.  Still been noticing some ongoing skin rash with minimally raised hyperpigmented spots along the medial border of the eyes.  He has not felt a significant improvement with his neck pain on the medication.  Still feels restriction in his range of motion especially looking towards the right and gets pain painful experiences towards the left side.     Previous HPI 12/19/21 Nathan [name] is a 52 y.o. male here for follow up for joint pains with  history of pulmonary and cutaneous sacoidosis now on methotrexate 15 mg PO weekly and folic acid 1 mg daily. He has not noticed any problems with the medication but also no improvement in symptoms so far. Still pain worst in the neck and upper back with radiation into back and with headaches. Asthma symptoms doing somewhat worse this year. He had cardiac MRI but is now recommended for cardiac PET scan to evaluate but not scheduled yet.   Previous HPI 11/15/2021 Nathan [name] is a 52 y.o. male here for joint pains with history of pulmonary and cutaneous sacoidosis.  His worst affected area is pretty constant pain in the base of the neck area frequently with radiation up the neck and posterior headaches associated with this.  He has had problems going on for years but seems to be progressively getting somewhat worse.  He does get some pain in extremities elsewhere but by far the most limiting for him is in the base of the neck.  He has tried multiple NSAIDs including meloxicam and Celebrex is also tried muscle relaxant medications none of which were very significantly beneficial.  He is tried physical therapy completing courses and had local injections that did not greatly relieve symptoms.  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  He had most recent neck MRI in 2022 based on findings had discussed surgery options but is looking to avoid this due to concern about complications or long-term needing additional revision. Original diagnosis of sarcoidosis was based on pulmonary imaging finding and treated with very prolonged prednisone taper.  Initially only had respiratory symptoms with some mild dyspnea and coughing that improved with the steroid treatments.  More recently this year he had an episode of similar symptoms coming back and was treated with a round of steroids from pulmonology clinic in noticed a increase in blood glucose up to 400s.  He is also developed some new skin rashes on the face previously had involvement on extremities but no facial rashes with original symptoms.  He is prescribed topical medication by Dr. Jorja Loa but has not seen any difference so far.   Review of Systems  Constitutional:  Positive for fatigue.  HENT:  Positive for mouth dryness. Negative for mouth sores.   Eyes:  Positive for dryness.  Respiratory:  Positive for shortness of breath.   Cardiovascular:  Positive for chest pain. Negative for palpitations.   Gastrointestinal:  Positive for constipation. Negative for blood in stool and diarrhea.  Endocrine: Negative for increased urination.  Genitourinary:  Negative for involuntary urination.  Musculoskeletal:  Positive for joint pain, gait problem, joint pain, myalgias, muscle weakness, morning stiffness, muscle tenderness and myalgias. Negative for joint swelling.  Skin:  Positive for color change and sensitivity to sunlight. Negative for rash and hair loss.  Allergic/Immunologic: Negative for susceptible to infections.  Neurological:  Positive for headaches. Negative for dizziness.  Hematological:  Negative for swollen glands.  Psychiatric/Behavioral:  Positive for depressed mood and sleep disturbance. The patient is nervous/anxious.     PMFS History:  Patient Active Problem List   Diagnosis Date Noted   Cervical spine pain 08/29/2022   Myalgia 08/29/2022   High risk medication use 11/15/2021   HLD (hyperlipidemia) 09/13/2021   Asthma 11/10/2020   COVID-19 virus infection 10/21/2019   Shoulder pain, left 10/23/2016   Other chest pain 08/22/2014   Upper airway cough syndrome 07/23/2014   Sarcoidosis 03/06/2011    Past Medical History:  Diagnosis Date   Acid  reflux disease    Arthritis    Asthma    'I think I might have asthma"   Chronic back pain    Dyspnea    chronic   Hyperlipidemia    Sarcoidosis     Family History  Problem Relation Age of Onset   Hypertension Mother    Diabetes Mother    Heart attack Father    Diabetes Father    Hypertension Father    Gout Father    Colon polyps Sister    Heart disease Paternal Uncle    Prostate cancer Paternal Uncle    Heart disease Maternal Grandfather    Anemia Daughter    Anemia Daughter    Colon cancer Neg Hx    Esophageal cancer Neg Hx    Rectal cancer Neg Hx    Stomach cancer Neg Hx    Past Surgical History:  Procedure Laterality Date   NO PAST SURGERIES     Social History   Social History Narrative   Not on  file   Immunization History  Administered Date(s) Administered   Dtap, Unspecified 11/09/1972, 01/07/1973, 03/01/1973, 03/24/1974, 12/06/1977   Influenza,inj,Quad PF,6+ Mos 11/10/2020, 02/16/2022   Measles 09/23/1973   Mumps 03/24/1974   PFIZER Comirnaty(Gray Top)Covid-19 Tri-Sucrose Vaccine 10/28/2020   PFIZER(Purple Top)SARS-COV-2 Vaccination 10/27/2020   PNEUMOCOCCAL CONJUGATE-20 02/16/2022   Polio, Unspecified 11/09/1972, 01/07/1973, 03/11/1973, 03/24/1974, 12/06/1977   Rubella 09/23/1973   Tdap 02/16/2022     Objective: Vital Signs: BP 121/79 (BP Location: Left Arm, Patient Position: Sitting, Cuff Size: Normal)   Pulse 99   Resp 14   Ht 5\' 8"  (1.727 m)   Wt 238 lb (108 kg)   BMI 36.19 kg/m    Physical Exam Constitutional:      Appearance: He is obese.  Eyes:     Conjunctiva/sclera: Conjunctivae normal.  Cardiovascular:     Rate and Rhythm: Normal rate and regular rhythm.  Pulmonary:     Effort: Pulmonary effort is normal.     Breath sounds: Normal breath sounds.  Musculoskeletal:     Right lower leg: No edema.     Left lower leg: No edema.  Lymphadenopathy:     Cervical: No cervical adenopathy.  Skin:    General: Skin is warm and dry.     Findings: Rash present.     Comments: Flat mildly hyperpigmented patch along medial border of eye No rash on extremities  Neurological:     Mental Status: He is alert.     Motor: No weakness.  Psychiatric:        Mood and Affect: Mood normal.      Musculoskeletal Exam:  Neck full ROM, mild tenderness to pressure at cervical paraspinal muscles, no palpable knots or swelling in scalene or trapezius muscles Shoulders full ROM no tenderness or swelling Elbows full ROM no tenderness or swelling Wrists full ROM no tenderness or swelling Fingers full ROM no tenderness or swelling Knees full ROM no tenderness or swelling   Investigation: No additional findings.  Imaging: No results found.  Recent Labs: Lab Results   Component Value Date   WBC 7.6 05/30/2022   HGB 13.7 05/30/2022   PLT 273 05/30/2022   NA 141 05/30/2022   K 4.6 05/30/2022   CL 101 05/30/2022   CO2 28 05/30/2022   GLUCOSE 104 05/30/2022   BUN 19 05/30/2022   CREATININE 0.82 05/30/2022   BILITOT 0.9 05/30/2022   ALKPHOS 92 04/28/2022   AST 26 05/30/2022  ALT 29 05/30/2022   PROT 7.7 05/30/2022   ALBUMIN 4.6 04/28/2022   CALCIUM 9.6 05/30/2022   GFRAA >60 09/17/2019    Speciality Comments: No specialty comments available.  Procedures:  No procedures performed Allergies: Tizanidine, Hydrocodone-acetaminophen, Oxycodone-acetaminophen, Percocet [oxycodone-acetaminophen], and Vicodin [hydrocodone-acetaminophen]   Assessment / Plan:     Visit Diagnoses: Sarcoidosis - Plan: Sedimentation rate  Inflammation appears well-controlled from skin or articular disease.  Has upcoming follow-up for CT in pulmonology clinic.  If there is ongoing disease activity consider addition of TNF inhibitor if this is thought to be affecting the mild dyspnea on exertion.  Checking sed rate for disease activity monitoring.  Plan to continue methotrexate 20 mg p.o. weekly folic acid 1 mg daily and prednisone 5 mg daily.  High risk medication use - Plan: CBC with Differential/Platelet, COMPLETE METABOLIC PANEL WITH GFR  Checking CBC and CMP for medication monitoring on long-term use of methotrexate.  No serious interval infections.  Cervical spine pain  [facility] with [facility].  Agree with the current treatment plan doing injections, looks like upcoming plans including possible trigger point injections.  Myalgia - Plan: CK  Muscle pain and cramping with elevated CK after going back onto 2 cholesterol medications.  He stopped the ezetimibe and thinks he has been partial benefit so far.  This is less commonly associated with muscle inflammation versus statin but agree with keeping the more effective medication if  tolerable.  Will recheck CK level today.  Orders: Orders Placed This Encounter  Procedures   Sedimentation rate   CBC with Differential/Platelet   COMPLETE METABOLIC PANEL WITH GFR   CK   No orders of the defined types were placed in this encounter.    Follow-Up Instructions: Return in about 3 months (around 11/29/2022) for Sarcoid on MTX/GC f/u 3mos.   Fuller Plan, MD  Note - This record has been created using [employer].  Chart creation errors have been sought, but may not always  have been located. Such creation errors do not reflect on  the standard of medical care.

## 2022-08-27 NOTE — Discharge Instructions (Addendum)
Please take medication as prescribed. Take with food to avoid upset stomach. Finish the full course!  Use ibuprofen and/or tylenol for pain in the meantime Try salt water gargles  Please keep appointment with dentist!

## 2022-08-27 NOTE — ED Provider Notes (Signed)
[facility]    CSN: [id number] Arrival date & time: 08/27/22  1425      History   Chief Complaint Chief Complaint  Patient presents with   Dental Pain    HPI Reg Nathan [name] is a 52 y.o. male.  2-day history of left lower dental pain Rates 10/10. No fevers or trouble breathing. Denies facial swelling Has dentist appointment scheduled on 7/24 (in 10 days).  Reports he needs to have some teeth pulled. He tried Orajel that made the area burn.  No other meds  Past Medical History:  Diagnosis Date   Arthritis    Asthma    'I think I might have asthma"   Chronic back pain    Dyspnea    chronic   Hyperlipidemia    Sarcoidosis     Patient Active Problem List   Diagnosis Date Noted   High risk medication use 11/15/2021   HLD (hyperlipidemia) 09/13/2021   Asthma 11/10/2020   COVID-19 virus infection 10/21/2019   Shoulder pain, left 10/23/2016   Other chest pain 08/22/2014   Upper airway cough syndrome 07/23/2014   Sarcoidosis 03/06/2011    Past Surgical History:  Procedure Laterality Date   NO PAST SURGERIES         Home Medications    Prior to Admission medications   Medication Sig Start Date End Date Taking? Authorizing Provider  amoxicillin-clavulanate (AUGMENTIN) 875-125 MG tablet Take 1 tablet by mouth every 12 (twelve) hours for 5 days. 08/27/22 09/01/22 Yes Harland Aguiniga, Lurena Joiner, PA-C  albuterol (VENTOLIN HFA) 108 (90 Base) MCG/ACT inhaler Inhale 2 puffs into the lungs every 6 (six) hours as needed for wheezing or shortness of breath. 03/08/22   Bevelyn Ngo, NP  aspirin EC 81 MG tablet Take 1 tablet (81 mg total) by mouth daily. Swallow whole. 02/28/21   Orbie Pyo, MD  atorvastatin (LIPITOR) 20 MG tablet Take 1 tablet (20 mg total) by mouth daily. 02/27/22   Orbie Pyo, MD  Budeson-Glycopyrrol-Formoterol (BREZTRI AEROSPHERE) 160-9-4.8 MCG/ACT AERO Inhale 2 puffs into the lungs in the morning and at bedtime. 03/14/22   Bevelyn Ngo, NP   cyclobenzaprine (FLEXERIL) 10 MG tablet TAKE ONE TABLET BY MOUTH THREE TIMES A DAY AS NEEDED FOR MUSCLE SPASMS FOR 7 DAYS 05/18/22   [provider]  ezetimibe (ZETIA) 10 MG tablet Take 10 mg by mouth daily. 05/23/21   [provider]  folic acid (FOLVITE) 1 MG tablet Take 1 tablet (1 mg total) by mouth daily. 02/28/22   Rice, Jamesetta Orleans, MD  furosemide (LASIX) 20 MG tablet Take 0.5 tablets (10 mg total) by mouth daily. 03/13/22   Orbie Pyo, MD  JANUVIA 50 MG tablet Take 1 tablet by mouth daily. 04/25/22 07/24/22  [provider]  loratadine (CLARITIN) 10 MG tablet Take 1 tablet by mouth daily. 04/20/22 07/19/22  [provider]  metFORMIN (GLUCOPHAGE-XR) 500 MG 24 hr tablet Take 500 mg by mouth 2 (two) times daily. 08/24/21   [provider]  methotrexate (RHEUMATREX) 2.5 MG tablet Take 8 tablets (20 mg total) by mouth once a week. Caution:Chemotherapy. Protect from light. 05/30/22   Fuller Plan, MD  omeprazole (PRILOSEC) 20 MG capsule Take by mouth. 05/29/22 07/28/22  [provider]  pantoprazole (PROTONIX) 40 MG tablet Take 1 tablet (40 mg total) by mouth daily. 04/09/20   Pricilla Loveless, MD  predniSONE (DELTASONE) 5 MG tablet Take 1 tablet (5 mg total) by mouth daily with  breakfast. 05/30/22   Fuller Plan, MD  pregabalin (LYRICA) 150 MG capsule  05/18/22   [provider]  sulfamethoxazole-trimethoprim (BACTRIM DS) 800-160 MG tablet TAKE ONE TABLET BY MOUTH THREE TIMES A WEEK (MONDAY, WEDNESDAY, FRIDAY) 07/11/22   Leslye Peer, MD  tacrolimus (PROTOPIC) 0.1 % ointment Apply topically 2 (two) times daily. 09/07/21   Janalyn Harder, MD  triamcinolone ointment (KENALOG) 0.1 % Apply 1 Application topically 2 (two) times daily.    [provider]    Family History Family History  Problem Relation Age of Onset   Hypertension Mother    Diabetes Mother    Heart attack Father    Diabetes Father    Hypertension  Father    Gout Father    Colon polyps Sister    Heart disease Paternal Uncle    Prostate cancer Paternal Uncle    Heart disease Maternal Grandfather    Anemia Daughter    Anemia Daughter    Colon cancer Neg Hx    Esophageal cancer Neg Hx    Rectal cancer Neg Hx    Stomach cancer Neg Hx     Social History Social History   Tobacco Use   Smoking status: Former    Current packs/day: 0.00    Average packs/day: 1 pack/day for 20.0 years (20.0 ttl pk-yrs)    Types: Cigarettes    Start date: 01/13/1991    Quit date: 01/13/2011    Years since quitting: 11.6    Passive exposure: Past   Smokeless tobacco: Never  Vaping Use   Vaping status: Never Used  Substance Use Topics   Alcohol use: No    Alcohol/week: 0.0 standard drinks of alcohol   Drug use: No     Allergies   Tizanidine, Hydrocodone-acetaminophen, Oxycodone-acetaminophen, Percocet [oxycodone-acetaminophen], and Vicodin [hydrocodone-acetaminophen]   Review of Systems Review of Systems As per HPI  Physical Exam Triage Vital Signs ED Triage Vitals  Encounter Vitals Group     BP 08/27/22 1527 125/88     Systolic BP Percentile --      Diastolic BP Percentile --      Pulse Rate 08/27/22 1527 82     Resp 08/27/22 1527 18     Temp 08/27/22 1527 98.6 F (37 C)     Temp Source 08/27/22 1527 Oral     SpO2 08/27/22 1527 96 %     Weight --      Height --      Head Circumference --      Peak Flow --      Pain Score 08/27/22 1524 10     Pain Loc --      Pain Education --      Exclude from Growth Chart --    No data found.  Updated Vital Signs BP 125/88 (BP Location: Right Arm)   Pulse 82   Temp 98.6 F (37 C) (Oral)   Resp 18   SpO2 96%   Physical Exam Vitals and nursing note reviewed.  Constitutional:      General: He is not in acute distress.    Appearance: Normal appearance. He is not ill-appearing.  HENT:     Mouth/Throat:     Mouth: Mucous membranes are moist.     Dentition: Abnormal dentition.  Dental caries present. No dental tenderness, gingival swelling or gum lesions.     Pharynx: Oropharynx is clear. No posterior oropharyngeal erythema.      Comments: Points to right lower molar as  area of pain. No dental tenderness here. The tooth just anterior to this is abnormal, rotted, mostly missing. Caries throughout. No swelling of gums Cardiovascular:     Rate and Rhythm: Normal rate and regular rhythm.     Heart sounds: Normal heart sounds.  Pulmonary:     Effort: Pulmonary effort is normal.     Breath sounds: Normal breath sounds.  Abdominal:     Palpations: Abdomen is soft.  Neurological:     Mental Status: He is alert and oriented to person, place, and time.     UC Treatments / Results  Labs (all labs ordered are listed, but only abnormal results are displayed) Labs Reviewed - No data to display  EKG  Radiology No results found.  Procedures Procedures (including critical care time)  Medications Ordered in UC Medications - No data to display  Initial Impression / Assessment and Plan / UC Course  I have reviewed the triage vital signs and the nursing notes.  Pertinent labs & imaging results that were available during my care of the patient were reviewed by me and considered in my medical decision making (see chart for details).  Afebrile, well appearing, no facial swelling or airway compromise Cover for dental infection with Augmentin twice daily for 5 days.  Discussed alternating between Tylenol and ibuprofen as needed for pain.  Can try salt water gargles.  Reassuring he has dental appointment in 10 days, advised to keep appointment.  Strict return ED precaution discussed.  Patient agreeable to plan.  Final Clinical Impressions(s) / UC Diagnoses   Final diagnoses:  Pain, dental  Dental infection     Discharge Instructions      Please take medication as prescribed. Take with food to avoid upset stomach. Finish the full course!  Use ibuprofen and/or  tylenol for pain in the meantime Try salt water gargles  Please keep appointment with dentist!    ED Prescriptions     Medication Sig Dispense Auth. Provider   amoxicillin-clavulanate (AUGMENTIN) 875-125 MG tablet Take 1 tablet by mouth every 12 (twelve) hours for 5 days. 10 tablet Rasheed Welty, Lurena Joiner, PA-C      PDMP not reviewed this encounter.   Kathrine Haddock 08/27/22 1630

## 2022-08-27 NOTE — ED Triage Notes (Signed)
Patient has dental pain.  Patient is scheduled to go to dentist on 7/24.  Patient has pain on left side of face, ear and into left side of head.  Patient reports the tooth is on the left bottom.  Patient reports the appt this month is to start the process of getting his teeth pulled  Patient has taken warm salt water gargles, numbing medicine

## 2022-08-29 ENCOUNTER — Ambulatory Visit: Payer: Medicaid Other | Attending: Internal Medicine | Admitting: Internal Medicine

## 2022-08-29 ENCOUNTER — Encounter: Payer: Self-pay | Admitting: Internal Medicine

## 2022-08-29 ENCOUNTER — Other Ambulatory Visit: Payer: Self-pay | Admitting: Internal Medicine

## 2022-08-29 VITALS — BP 121/79 | HR 99 | Resp 14 | Ht 68.0 in | Wt 238.0 lb

## 2022-08-29 DIAGNOSIS — M542 Cervicalgia: Secondary | ICD-10-CM | POA: Insufficient documentation

## 2022-08-29 DIAGNOSIS — D869 Sarcoidosis, unspecified: Secondary | ICD-10-CM

## 2022-08-29 DIAGNOSIS — Z79899 Other long term (current) drug therapy: Secondary | ICD-10-CM | POA: Diagnosis not present

## 2022-08-29 DIAGNOSIS — M791 Myalgia, unspecified site: Secondary | ICD-10-CM | POA: Insufficient documentation

## 2022-08-29 LAB — CBC WITH DIFFERENTIAL/PLATELET
Eosinophils Absolute: 130 cells/uL (ref 15–500)
HCT: 40.3 % (ref 38.5–50.0)

## 2022-08-30 LAB — CBC WITH DIFFERENTIAL/PLATELET
Absolute Monocytes: 706 cells/uL (ref 200–950)
Basophils Absolute: 29 cells/uL (ref 0–200)
Basophils Relative: 0.4 %
Eosinophils Relative: 1.8 %
Hemoglobin: 13.2 g/dL (ref 13.2–17.1)
Lymphs Abs: 734 cells/uL — ABNORMAL LOW (ref 850–3900)
MCH: 29.1 pg (ref 27.0–33.0)
MCHC: 32.8 g/dL (ref 32.0–36.0)
MCV: 88.8 fL (ref 80.0–100.0)
MPV: 9.8 fL (ref 7.5–12.5)
Monocytes Relative: 9.8 %
Neutro Abs: 5602 cells/uL (ref 1500–7800)
Neutrophils Relative %: 77.8 %
Platelets: 264 10*3/uL (ref 140–400)
RBC: 4.54 10*6/uL (ref 4.20–5.80)
RDW: 13 % (ref 11.0–15.0)
Total Lymphocyte: 10.2 %
WBC: 7.2 10*3/uL (ref 3.8–10.8)

## 2022-08-30 LAB — COMPLETE METABOLIC PANEL WITH GFR
AG Ratio: 1.8 (calc) (ref 1.0–2.5)
ALT: 35 U/L (ref 9–46)
AST: 27 U/L (ref 10–40)
Albumin: 4.4 g/dL (ref 3.6–5.1)
Alkaline phosphatase (APISO): 65 U/L (ref 36–130)
BUN: 14 mg/dL (ref 7–25)
CO2: 33 mmol/L — ABNORMAL HIGH (ref 20–32)
Calcium: 9.5 mg/dL (ref 8.6–10.3)
Chloride: 98 mmol/L (ref 98–110)
Creat: 0.79 mg/dL (ref 0.60–1.29)
Globulin: 2.4 g/dL (calc) (ref 1.9–3.7)
Glucose, Bld: 94 mg/dL (ref 65–99)
Potassium: 4.2 mmol/L (ref 3.5–5.3)
Sodium: 138 mmol/L (ref 135–146)
Total Bilirubin: 0.8 mg/dL (ref 0.2–1.2)
Total Protein: 6.8 g/dL (ref 6.1–8.1)
eGFR: 109 mL/min/{1.73_m2} (ref >=60)

## 2022-08-30 LAB — CK: Total CK: 282 U/L — ABNORMAL HIGH (ref 44–196)

## 2022-08-30 LAB — SEDIMENTATION RATE: Sed Rate: 6 mm/h (ref 0–15)

## 2022-09-04 ENCOUNTER — Telehealth: Payer: Self-pay | Admitting: *Deleted

## 2022-09-04 NOTE — Telephone Encounter (Signed)
Patient contacted the office stating he was in the office on 08/29/2022. Patient states he had labs drawn and would like to review the results. Please advise.

## 2022-09-04 NOTE — Telephone Encounter (Signed)
Now addressed in result note for this visit.

## 2022-09-04 NOTE — Progress Notes (Signed)
CK is slightly elevated at 282 but much better than 575 last year. This is consistent with drug related muscle inflammation but not bad. He should avoid resuming the ezetimibe at least not in combination with atorvastatin again. Blood count and kidney and liver function look fine no problems to continue his methotrexate. The lymphocyte count is slightly low at 734 this is one type of white blood cell the others are normal.  Sedimentation rate of 6 is still normal.

## 2022-09-05 ENCOUNTER — Other Ambulatory Visit: Payer: Self-pay | Admitting: *Deleted

## 2022-09-19 ENCOUNTER — Telehealth: Payer: Self-pay | Admitting: Gastroenterology

## 2022-09-19 NOTE — Telephone Encounter (Signed)
[name],    This patient is requesting to have recall colonoscopy and to also have a endoscopy due to choking on food. Patient saw [name] today 09/19/2022 for an initial consult for endoscopy. Patient stated Nathan [name] referred patient back to [place] since he was a previous patient and is also due for a colonoscopy and could have endoscopy preformed at the same time. Please review and advise on scheduling.    Thank you.

## 2022-09-19 NOTE — Telephone Encounter (Signed)
Patient confirmed that he was referred by ENT to [facility] and had a initial consult today 8/6. Advised he will need to stay with [facility] and continue with colonoscopy and endoscopy there.

## 2022-09-19 NOTE — Telephone Encounter (Signed)
If he had an actual clinic visit today and it was completed by one of the [facility] GI providers today, he will need to stay with their group. If he did not go through the clinic visit, then I will consider, but this information needs to be understood. We cannot have other groups just deferring their patients to our group. It is not acceptable. GM

## 2022-09-22 NOTE — Telephone Encounter (Signed)
Inbound call from [facility] on behalf of patient. Stated they were not aware that patient has been seeing [facility]. Advised since patient was seen in office with them he is now a patient with [facility].

## 2022-10-19 ENCOUNTER — Ambulatory Visit (HOSPITAL_COMMUNITY)
Admission: RE | Admit: 2022-10-19 | Discharge: 2022-10-19 | Disposition: A | Discharge status: Home / Self Care | Payer: Medicaid Other | Source: Ambulatory Visit | Attending: Emergency Medicine | Admitting: Emergency Medicine

## 2022-10-19 DIAGNOSIS — D869 Sarcoidosis, unspecified: Secondary | ICD-10-CM | POA: Insufficient documentation

## 2022-11-02 ENCOUNTER — Ambulatory Visit (INDEPENDENT_AMBULATORY_CARE_PROVIDER_SITE_OTHER): Payer: Medicaid Other | Admitting: Emergency Medicine

## 2022-11-02 ENCOUNTER — Encounter: Payer: Self-pay | Admitting: Emergency Medicine

## 2022-11-02 VITALS — BP 138/86 | HR 104 | Resp 16 | Ht 68.0 in | Wt 237.4 lb

## 2022-11-02 DIAGNOSIS — J4520 Mild intermittent asthma, uncomplicated: Secondary | ICD-10-CM

## 2022-11-02 DIAGNOSIS — D869 Sarcoidosis, unspecified: Secondary | ICD-10-CM

## 2022-11-02 NOTE — Patient Instructions (Signed)
We reviewed your CT scan of the chest today. We will plan to repeat your CT chest to follow your sarcoidosis in September 2025 We will probably repeat your pulmonary function testing in the next year.  We can decide the timing when you follow-up. Please continue your Breztri 2 puffs twice a day.  Rinse and gargle after using. Keep your albuterol available to use 2 puffs when needed for shortness of breath, chest tightness, wheezing. Continue methotrexate and prednisone as directed by Dr. Dimple Casey Continue the Bactrim 3 times a week Follow with dermatology as planned Get the flu shot this fall Consider getting the COVID-19 vaccine this fall Follow Dr. Delton Coombes in 6 months, sooner if you have any problems. We will work on helping you get access to your medical records.  You can have these sent to you and we can also try to get you on MyChart.

## 2022-11-02 NOTE — Assessment & Plan Note (Signed)
Associated with his sarcoid.  Plan to continue same regimen  Please continue your Breztri 2 puffs twice a day.  Rinse and gargle after using. Keep your albuterol available to use 2 puffs when needed for shortness of breath, chest tightness, wheezing.

## 2022-11-02 NOTE — Progress Notes (Signed)
Subjective:    Patient ID: Nathan [name], male    DOB: 14-Sep-1970, 52 y.o.   MRN: [id number]  HPI  ROV 12/07/21 --52 year old man with history of former tobacco use and sarcoidosis with pulmonary and cutaneous manifestations.  He has severe obstructive lung disease.  Also with GERD, chronic rhinitis.  Last seen in July. He was evaluated by cards and cardiac MRI questioned whether there could be cardiac sarcoid - he is planning for cardiac PET at [facility], not yet arranged.  He is dealing with a lot of joint pain. No new rash or breathing trouble. He was just started on MTX by Dr Dimple Casey w Rheumatology.  We had attempted to restart his Symbicort last time, but he was unable to get get assistance so he is not on it. He has fairly good functional capacity, but can be limited by dyspnea. Minimal cough or wheeze. Uses albuterol  afew times a week.   CT scan of the chest performed 11/22/2021 reviewed by me shows stable mediastinal and hilar adenopathy, bilateral bandlike opacities and some scattered pulmonary nodules are all stable.  1.1 x 0.8 cm left upper lobe nodular opacity is smaller compared with prior.  No evidence of active sarcoidosis  ROV 04/21/22 --Nathan [name] is 52, former tobacco use, history of sarcoidosis with pulmonary and cutaneous manifestations.  He has associated severe obstructive lung disease.  Has been managed by Dr. Dimple Casey with rheumatology on methotrexate, has been dealing with skin changes around the eyes, joint disease.  There was some question of cardiac sarcoidosis on MRI 11/16/2021 prompting a cardiac PET scan 02/10/2022 that did not show any abnormal metabolism to indicate an active inflammatory process in the myocardium, no evidence of cardiac sarcoid.  He has been having progressive shortness of breath and some chest heaviness and was started on Breztri 03/08/2022, has required patient's assistance paperwork which is all still pending.  He was also referred to dermatology at that time  - hasn't seen them yet.  ACE level was 17.  Given the more active skin disease, joint disease his methotrexate is now up to 20 mg weekly, now on prednisone 5 mg daily.  He reports that the Markus Daft has been beneficial, he wants to continue it.   11/02/2022 --13-month follow-up visit for 52 year old gentleman with a history of former tobacco use and sarcoidosis with pulmonary cutaneous manifestations.  No evidence of cardiac involvement on cardiac PET.  He has severe obstructive lung disease.  He is currently managed by Dr. Dimple Casey with rheumatology on methotrexate + pred 5mg .  We and Trelegy at his last visit.  We have been managing him on Breztri.  Today he reports that he has been dealing with neck and back pain, seeing pain specialists. His breathing is about the same. He is limited with some exertion, even walking a distance. He is using breztri reliably. He uses albuterol quite rarely, few times a month.   CT chest 10/19/2022 reviewed by me similar bulky hilar and mediastinal adenopathy cylindrical varicose bronchiectasis with peribronchovascular thickening similar to priors no new masses, nodules present.    Review of Systems As per HPI     Objective:   Physical Exam Vitals:   11/02/22 1519  BP: 138/86  Pulse: (!) 104  Resp: 16  SpO2: 97%  Weight: 237 lb 6.4 oz (107.7 kg)  Height: 5\' 8"  (1.727 m)   Gen: Pleasant, well-nourished, in no distress,  normal affect  ENT: No lesions, few raised areas in the periorbital  region bilaterally left greater than right, single raised area on the back of his neck unchanged.  No abnormalities oropharynx, pupils equal  Neck: No JVD, no stridor  Lungs: No use of accessory muscles, no crackles or wheezing on normal respiration, no wheeze on forced expiration  Cardiovascular: RRR, heart sounds normal, no murmur or gallops, no peripheral edema  Musculoskeletal: No deformities, no cyanosis or clubbing  Neuro: alert, awake, non focal  Skin: Skin  involvement as above      Assessment & Plan:  Sarcoidosis His CT chest is stable.  Overall he is doing fairly well, compensated on his current regimen.  He does still have a rash and needs to find a new dermatologist.  He is working on this.  We reviewed your CT scan of the chest today. We will plan to repeat your CT chest to follow your sarcoidosis in September 2025 We will probably repeat your pulmonary function testing in the next year.  We can decide the timing when you follow-up. Continue methotrexate and prednisone as directed by Dr. Dimple Casey Continue the Bactrim 3 times a week Follow with dermatology as planned Get the flu shot this fall Consider getting the COVID-19 vaccine this fall Follow Dr. Delton Coombes in 6 months, sooner if you have any problems. We will work on helping you get access to your medical records.  You can have these sent to you and we can also try to get you on MyChart.  Asthma Associated with his sarcoid.  Plan to continue same regimen  Please continue your Breztri 2 puffs twice a day.  Rinse and gargle after using. Keep your albuterol available to use 2 puffs when needed for shortness of breath, chest tightness, wheezing.   Levy Pupa, MD, PhD 11/02/2022, 4:32 PM [facility] [phone] or if no answer [phone]

## 2022-11-02 NOTE — Assessment & Plan Note (Signed)
His CT chest is stable.  Overall he is doing fairly well, compensated on his current regimen.  He does still have a rash and needs to find a new dermatologist.  He is working on this.  We reviewed your CT scan of the chest today. We will plan to repeat your CT chest to follow your sarcoidosis in September 2025 We will probably repeat your pulmonary function testing in the next year.  We can decide the timing when you follow-up. Continue methotrexate and prednisone as directed by Dr. Dimple Casey Continue the Bactrim 3 times a week Follow with dermatology as planned Get the flu shot this fall Consider getting the COVID-19 vaccine this fall Follow Dr. Delton Coombes in 6 months, sooner if you have any problems. We will work on helping you get access to your medical records.  You can have these sent to you and we can also try to get you on MyChart.

## 2022-11-07 ENCOUNTER — Other Ambulatory Visit: Payer: Self-pay | Admitting: Emergency Medicine

## 2022-11-07 ENCOUNTER — Other Ambulatory Visit: Payer: Self-pay | Admitting: Internal Medicine

## 2022-11-07 DIAGNOSIS — D869 Sarcoidosis, unspecified: Secondary | ICD-10-CM

## 2022-11-07 NOTE — Telephone Encounter (Signed)
Please schedule patient a follow up visit. Patient due 11/2022. Thanks!

## 2022-11-07 NOTE — Telephone Encounter (Signed)
Last Fill: 05/30/2022  Labs: 08/29/2022 CK is slightly elevated at 282 but much better than 575 last year. This is consistent with drug related muscle inflammation but not bad. He should avoid resuming the ezetimibe at least not in combination with atorvastatin again. Blood count and kidney and liver function look fine no problems to continue his methotrexate. The lymphocyte count is slightly low at 734 this is one type of white blood cell the others are normal. Sedimentation rate of 6 is still normal.  Next Visit: Due 11/29/2022. Message sent to the front to schedule.   Last Visit: 08/29/2022  DX: Sarcoidosis   Current Dose per office note 08/29/2022: methotrexate 20 mg p.o. weekly and prednisone 5 mg daily.   Okay to refill Methotrexate and Prednisone?

## 2022-11-09 ENCOUNTER — Ambulatory Visit: Payer: Medicaid Other | Attending: Nurse Practitioner | Admitting: Nurse Practitioner

## 2022-11-09 ENCOUNTER — Encounter: Payer: Self-pay | Admitting: Nurse Practitioner

## 2022-11-09 VITALS — BP 132/88 | HR 96 | Ht 68.0 in | Wt 230.0 lb

## 2022-11-09 DIAGNOSIS — I700 Atherosclerosis of aorta: Secondary | ICD-10-CM | POA: Diagnosis not present

## 2022-11-09 DIAGNOSIS — D869 Sarcoidosis, unspecified: Secondary | ICD-10-CM | POA: Diagnosis not present

## 2022-11-09 DIAGNOSIS — E785 Hyperlipidemia, unspecified: Secondary | ICD-10-CM

## 2022-11-09 DIAGNOSIS — I10 Essential (primary) hypertension: Secondary | ICD-10-CM

## 2022-11-09 DIAGNOSIS — E7841 Elevated Lipoprotein(a): Secondary | ICD-10-CM | POA: Diagnosis not present

## 2022-11-09 DIAGNOSIS — R0609 Other forms of dyspnea: Secondary | ICD-10-CM

## 2022-11-09 LAB — LIPID PANEL
Chol/HDL Ratio: 5.2 ratio — ABNORMAL HIGH (ref 0.0–5.0)
Cholesterol, Total: 217 mg/dL — ABNORMAL HIGH (ref 100–199)
HDL: 42 mg/dL (ref >39)
LDL Chol Calc (NIH): 159 mg/dL — ABNORMAL HIGH (ref 0–99)
Triglycerides: 89 mg/dL (ref 0–149)
VLDL Cholesterol Cal: 16 mg/dL (ref 5–40)

## 2022-11-09 MED ORDER — VALSARTAN 40 MG PO TABS: 40.0000 mg | ORAL_TABLET | Freq: Every day | ORAL | 3 refills | Status: DC

## 2022-11-09 NOTE — Patient Instructions (Addendum)
Medication Instructions:   START Valsartan one (1) tablet by mouth ( 40 mg) daily.   *If you need a refill on your cardiac medications before your next appointment, please call your pharmacy*   Lab Work:  TODAY!!!! LIPID  If you have labs (blood work) drawn today and your tests are completely normal, you will receive your results only by: MyChart Message (if you have MyChart) OR A paper copy in the mail If you have any lab test that is abnormal or we need to change your treatment, we will call you to review the results.   Testing/Procedures:  None ordered.   Follow-Up: At [facility], you and your health needs are our priority.  As part of our continuing mission to provide you with exceptional heart care, we have created designated Provider Care Teams.  These Care Teams include your primary Cardiologist (physician) and Advanced Practice Providers (APPs -  Physician Assistants and Nurse Practitioners) who all work together to provide you with the care you need, when you need it.  We recommend signing up for the patient portal called "MyChart".  Sign up information is provided on this After Visit Summary.  MyChart is used to connect with patients for Virtual Visits (Telemedicine).  Patients are able to view lab/test results, encounter notes, upcoming appointments, etc.  Non-urgent messages can be sent to your provider as well.   To learn more about what you can do with MyChart, go to [email or web address].    Your next appointment:   5 month(s)  Provider:   Orbie Pyo, MD     Other Instructions  Please get your Kidney Function checked with your PCP at your appointment on  Monday, October 7th.       Mediterranean Diet  Why follow it? Research shows. Those who follow the Mediterranean diet have a reduced risk of heart disease  The diet is associated with a reduced incidence of Parkinson's and Alzheimer's diseases People following the diet may have longer life  expectancies and lower rates of chronic diseases  The Dietary Guidelines for Americans recommends the Mediterranean diet as an eating plan to promote health and prevent disease  What Is the Mediterranean Diet?  Healthy eating plan based on typical foods and recipes of Mediterranean-style cooking The diet is primarily a plant based diet; these foods should make up a majority of meals   Starches - Plant based foods should make up a majority of meals - They are an important sources of vitamins, minerals, energy, antioxidants, and fiber - Choose whole grains, foods high in fiber and minimally processed items  - Typical grain sources include wheat, oats, barley, corn, brown rice, bulgar, farro, millet, polenta, couscous  - Various types of beans include chickpeas, lentils, fava beans, black beans, white beans   Fruits  Veggies - Large quantities of antioxidant rich fruits & veggies; 6 or more servings  - Vegetables can be eaten raw or lightly drizzled with oil and cooked  - Vegetables common to the traditional Mediterranean Diet include: artichokes, arugula, beets, broccoli, brussel sprouts, cabbage, carrots, celery, collard greens, cucumbers, eggplant, kale, leeks, lemons, lettuce, mushrooms, okra, onions, peas, peppers, potatoes, pumpkin, radishes, rutabaga, shallots, spinach, sweet potatoes, turnips, zucchini - Fruits common to the Mediterranean Diet include: apples, apricots, avocados, cherries, clementines, dates, figs, grapefruits, grapes, melons, nectarines, oranges, peaches, pears, pomegranates, strawberries, tangerines  Fats - Replace butter and margarine with healthy oils, such as olive oil, canola oil, and tahini  - Limit nuts to  no more than a handful a day  - Nuts include walnuts, almonds, pecans, pistachios, pine nuts  - Limit or avoid candied, honey roasted or heavily salted nuts - Olives are central to the Mediterranean diet - can be eaten whole or used in a variety of dishes   Meats  Protein - Limiting red meat: no more than a few times a month - When eating red meat: choose lean cuts and keep the portion to the size of deck of cards - Eggs: approx. 0 to 4 times a week  - Fish and lean poultry: at least 2 a week  - Healthy protein sources include, chicken, [place], lean beef, lamb - Increase intake of seafood such as tuna, salmon, trout, mackerel, shrimp, scallops - Avoid or limit high fat processed meats such as sausage and bacon  Dairy - Include moderate amounts of low fat dairy products  - Focus on healthy dairy such as fat free yogurt, skim milk, low or reduced fat cheese - Limit dairy products higher in fat such as whole or 2% milk, cheese, ice cream  Alcohol - Moderate amounts of red wine is ok  - No more than 5 oz daily for women (all ages) and men older than age 70  - No more than 10 oz of wine daily for men younger than 36  Other - Limit sweets and other desserts  - Use herbs and spices instead of salt to flavor foods  - Herbs and spices common to the traditional Mediterranean Diet include: basil, bay leaves, chives, cloves, cumin, fennel, garlic, lavender, marjoram, mint, oregano, parsley, pepper, rosemary, sage, savory, sumac, tarragon, thyme   It's not just a diet, it's a lifestyle:  The Mediterranean diet includes lifestyle factors typical of those in the region  Foods, drinks and meals are best eaten with others and savored Daily physical activity is important for overall good health This could be strenuous exercise like running and aerobics This could also be more leisurely activities such as walking, housework, yard-work, or taking the stairs Moderation is the key; a balanced and healthy diet accommodates most foods and drinks Consider portion sizes and frequency of consumption of certain foods   Meal Ideas & Options:  Breakfast:  Whole wheat toast or whole wheat English muffins with peanut butter & hard boiled egg Steel cut oats topped with apples &  cinnamon and skim milk  Fresh fruit: banana, strawberries, melon, berries, peaches  Smoothies: strawberries, bananas, greek yogurt, peanut butter Low fat greek yogurt with blueberries and granola  Egg white omelet with spinach and mushrooms Breakfast couscous: whole wheat couscous, apricots, skim milk, cranberries  Sandwiches:  Hummus and grilled vegetables (peppers, zucchini, squash) on whole wheat bread   Grilled chicken on whole wheat pita with lettuce, tomatoes, cucumbers or tzatziki  [place] salad on whole wheat bread: tuna salad made with greek yogurt, olives, red peppers, capers, green onions Garlic rosemary lamb pita: lamb sauted with garlic, rosemary, salt & pepper; add lettuce, cucumber, greek yogurt to pita - flavor with lemon juice and black pepper  Seafood:  Mediterranean grilled salmon, seasoned with garlic, basil, parsley, lemon juice and black pepper Shrimp, lemon, and spinach whole-grain pasta salad made with low fat greek yogurt  Seared scallops with lemon orzo  Seared tuna steaks seasoned salt, pepper, coriander topped with tomato mixture of olives, tomatoes, olive oil, minced garlic, parsley, green onions and cappers  Meats:  Herbed greek chicken salad with kalamata olives, cucumber, feta  Red bell  peppers stuffed with spinach, bulgur, lean ground beef (or lentils) & topped with feta   Kebabs: skewers of chicken, tomatoes, onions, zucchini, squash  [place] burgers: made with red onions, mint, dill, lemon juice, feta cheese topped with roasted red peppers Vegetarian Cucumber salad: cucumbers, artichoke hearts, celery, red onion, feta cheese, tossed in olive oil & lemon juice  Hummus and whole grain pita points with a greek salad (lettuce, tomato, feta, olives, cucumbers, red onion) Lentil soup with celery, carrots made with vegetable broth, garlic, salt and pepper  Tabouli salad: parsley, bulgur, mint, scallions, cucumbers, tomato, radishes, lemon juice, olive oil, salt and  pepper.  Adopting a Healthy Lifestyle.   Weight: Know what a healthy weight is for you (roughly BMI <25) and aim to maintain this. You can calculate your body mass index on your smart phone. Unfortunately, this is not the most accurate measure of healthy weight, but it is the simplest measurement to use. A more accurate measurement involves body scanning which measures lean muscle, fat tissue and bony density. We do not have this equipment at [facility].    Diet: Aim for 7+ servings of fruits and vegetables daily Limit animal fats in diet for cholesterol and heart health - choose grass fed whenever available Avoid highly processed foods (fast food burgers, tacos, fried chicken, pizza, hot dogs, french fries)  Saturated fat comes in the form of butter, lard, coconut oil, margarine, partially hydrogenated oils, and fat in meat. These increase your risk of cardiovascular disease.  Use healthy plant oils, such as olive, canola, soy, corn, sunflower and peanut.  Whole foods such as fruits, vegetables and whole grains have fiber  Men need > 38 grams of fiber per day Women need > 25 grams of fiber per day  Load up on vegetables and fruits - one-half of your plate: Aim for color and variety, and remember that potatoes dont count. Go for whole grains - one-quarter of your plate: Whole wheat, barley, wheat berries, quinoa, oats, brown rice, and foods made with them. If you want pasta, go with whole wheat pasta. Protein power - one-quarter of your plate: Fish, chicken, beans, and nuts are all healthy, versatile protein sources. Limit red meat. You need carbohydrates for energy! The type of carbohydrate is more important than the amount. Choose carbohydrates such as vegetables, fruits, whole grains, beans, and nuts in the place of white rice, white pasta, potatoes (baked or fried), macaroni and cheese, cakes, cookies, and donuts.  If youre thirsty, drink water. Coffee and tea are good in moderation, but skip  sugary drinks and limit milk and dairy products to one or two daily servings. Keep sugar intake at 6 teaspoons or 24 grams or LESS       Exercise: Aim for 150 min of moderate intensity exercise weekly for heart health, and weights twice weekly for bone health Stay active - any steps are better than no steps! Aim for 7-9 hours of sleep daily

## 2022-11-10 ENCOUNTER — Encounter: Payer: Self-pay | Admitting: Nurse Practitioner

## 2022-11-10 ENCOUNTER — Telehealth: Payer: Self-pay | Admitting: Pharmacist

## 2022-11-10 ENCOUNTER — Ambulatory Visit: Payer: Medicaid Other | Attending: Cardiovascular Disease | Admitting: Pharmacist

## 2022-11-10 DIAGNOSIS — E7841 Elevated Lipoprotein(a): Secondary | ICD-10-CM

## 2022-11-10 NOTE — Progress Notes (Signed)
Patient ID: Nathan [name]                 DOB: 1970/08/31                    MRN: [id number]     HPI: Nathan [name] is a 52 y.o. male patient of Dr Lynnette Caffey referred to lipid clinic by Nathan Bridegroom, NP. PMH is significant for sarcoidosis, chest pain, DM, aortic atherosclerosis although calcium score was 0 on coronary CTA 09/2021, and HLD. He was seen by PharmD last year, LDL improved from 151 to 74 after starting atorvastatin 10mg  daily in addition to ezetimibe 10mg  daily (prior cramping on rosuvastatin 5mg  daily). His atorvastatin was increased to 20mg  in Jan 2024, however this was removed from his med list at 11/02/22 pulm appt. Pravastatin 10mg  daily was added to his med list in August as a historic med, unclear by who. Ezetimibe was removed from his med list 09/05/22, also cannot see details.  Pt presents today for further management. Reports he stopped ezetimibe and atorvastatin secondary to hand cramps as well. His PCP started him on pravastatin last month which he has been tolerating well. Reports he does have nerve issues in his hands at baseline, but statins and ezetimibe made them worse. Motivated to stay healthy for his children and grandchildren. His father passed at age 3 from an MI.  Current Medications: pravastatin 10mg  daily Intolerances: rosuvastatin 5mg  daily, atorvastatin 10-20mg  daily, ezetimibe 10mg  daily - cramping in his hands Risk Factors: aortic atherosclerosis, mildly elevated Lp(a), DM LDL goal: 70mg /dL  Family History: Mother with HTN and DM, father with MI (passed at 48 from MI), DM, HTN, and gout.  Social History: Was a Administrator at [employer], now on disability. Mostly sedentary.  Labs: 11/09/22: TC 217, TG 89, HDL 42, LDL 159 - pravastatin 10mg  daily 11/08/21: TC 126, TG 73, HDL 37, LDL 74 - atorvastatin 10mg  daily, ezetimibe 10mg  daily 08/25/21: TC 215, TG 135, HDL 39, LDL 151, Lp(a) 97.9 - ezetimibe 10mg  daily  Past Medical History:  Diagnosis Date    Acid reflux disease    Arthritis    Asthma    'I think I might have asthma"   Chronic back pain    Dyspnea    chronic   Hyperlipidemia    Sarcoidosis     Current Outpatient Medications on File Prior to Visit  Medication Sig Dispense Refill   albuterol (VENTOLIN HFA) 108 (90 Base) MCG/ACT inhaler Inhale 2 puffs into the lungs every 6 (six) hours as needed for wheezing or shortness of breath. 18 g 6   aspirin EC 81 MG tablet Take 1 tablet (81 mg total) by mouth daily. Swallow whole. 90 tablet 3   BREZTRI AEROSPHERE 160-9-4.8 MCG/ACT AERO INHALE TWO PUFFS BY MOUTH TWICE A DAY (IN THE MORNING AND AT BEDTIME) 10.7 g 1   buprenorphine (BUTRANS) 15 MCG/HR 1 patch to skin Transdermal every week for 28 days     cyclobenzaprine (FLEXERIL) 10 MG tablet      Docusate Sodium (DSS) 100 MG CAPS Take by mouth.     folic acid (FOLVITE) 1 MG tablet Take 1 tablet (1 mg total) by mouth daily. 90 tablet 3   furosemide (LASIX) 20 MG tablet Take 0.5 tablets (10 mg total) by mouth daily. 45 tablet 2   JANUVIA 50 MG tablet Take 1 tablet by mouth daily.     loratadine (CLARITIN) 10 MG tablet Take 1 tablet by mouth  daily.     metFORMIN (GLUCOPHAGE-XR) 500 MG 24 hr tablet Take 500 mg by mouth 2 (two) times daily.     methotrexate (RHEUMATREX) 2.5 MG tablet TAKE EIGHT TABLETS BY MOUTH EVERY WEEK **CAUTION:CHEMOTHERAPY PROTET FROM LIGHT 104 tablet 0   omeprazole (PRILOSEC) 20 MG capsule Take by mouth.     ondansetron (ZOFRAN) 4 MG tablet Take 4 mg by mouth every 8 (eight) hours as needed.     polyethylene glycol powder (GLYCOLAX/MIRALAX) 17 GM/SCOOP powder Take by mouth.     pravastatin (PRAVACHOL) 10 MG tablet Take 10 mg by mouth daily.     predniSONE (DELTASONE) 5 MG tablet TAKE ONE TABLET BY MOUTH ONE TIME DAILY WITH BREAKFAST 90 tablet 0   pregabalin (LYRICA) 200 MG capsule 1 capsule Oral every 12 hours for 30 days     sulfamethoxazole-trimethoprim (BACTRIM DS) 800-160 MG tablet TAKE ONE TABLET BY MOUTH THREE  TIMES A WEEK (MON, WED, FRI) 12 tablet 2   tacrolimus (PROTOPIC) 0.1 % ointment Apply topically 2 (two) times daily. 100 g 11   triamcinolone ointment (KENALOG) 0.1 % Apply 1 Application topically 2 (two) times daily.     valsartan (DIOVAN) 40 MG tablet Take 1 tablet (40 mg total) by mouth daily. 90 tablet 3   No current facility-administered medications on file prior to visit.    Allergies  Allergen Reactions   Tizanidine Itching   Hydrocodone-Acetaminophen Itching   Oxycodone-Acetaminophen Itching   Percocet [Oxycodone-Acetaminophen] Itching    Able to take tylenol   Vicodin [Hydrocodone-Acetaminophen] Itching    Assessment/Plan:  1. Hyperlipidemia - LDL 159 on pravastatin 10mg  daily above goal < 70. Previously intolerant to rosuvastatin 5mg  daily, atorvastatin 10-20mg  daily, and ezetimibe 10mg  daily (hand cramping with all). Discussed addition of Repatha today which pt is agreeable to. Will continue pravastatin at same dose of 10mg  for now (want to avoid multiple med changes at once in case he has tolerability concerns). Will recheck labs in December. If further LDL lowering is needed at that time, can consider increasing pravastatin dose then. Ideally, addition of Repatha will bring his LDL to goal. Will submit PA and follow up with pt once approved. Has Medicaid insurance so copay will be low.  Yulisa Chirico E. Corinne Goucher, PharmD, BCACP, CPP [facility] [address] [place], [place] [address] Phone: [phone]; Fax: [phone] 11/10/2022 3:57 PM

## 2022-11-10 NOTE — Patient Instructions (Addendum)
Your LDL cholesterol is 159 and your goal is < 70  Continue taking pravastatin 10mg  daily  I will submit information to your insurance for Repatha and let you know when I hear back.    Repatha is a subcutaneous injection given once every 2 weeks in the fatty tissue of your stomach or upper outer thigh. Store the medication in the fridge. You can let your dose warm up to room temperature for 30 minutes before injecting if you prefer. Repatha will lower your LDL cholesterol by 60% and helps to lower your chance of having a heart attack or stroke.  Recheck fasting labs on Monday, October 2 any time after 7:30am   MyChart Username: [email or web address]  Password: [employer]

## 2022-11-10 NOTE — Telephone Encounter (Signed)
Repatha PA submitted, key [id number].

## 2022-11-13 MED ORDER — REPATHA SURECLICK 140 MG/ML ~~LOC~~ SOAJ: 140.0000 mg | SUBCUTANEOUS | 3 refills | Status: DC

## 2022-11-13 NOTE — Telephone Encounter (Signed)
PA approved through 11/10/23. Rx sent to pharmacy, pt notified. F/u labs already scheduled.

## 2022-11-22 NOTE — Progress Notes (Signed)
Office Visit Note  Patient: Nathan [name]             Date of Birth: 29-Apr-1970           MRN: [id number]             PCP: Pcp, No Referring: Medicine, [facility]* Visit Date: 12/06/2022   Subjective:  Follow-up   History of Present Illness: Nathan [name] is a 52 y.o. male here for follow up for sarcoidosis with pulmonary, articular, and cutaneous disease involvement currently on methotrexate 20 mg p.o. weekly folic acid 1 mg daily and prednisone 5 mg daily.  He has had persistent pain in his neck and some shooting radiating type pain more down the right arm than down the left arm.  He is experiencing stiffness and numbness in the hands more after prolonged use especially holding a tight grip.  A little bit of pain around the MCP joints of the hand otherwise more just trouble gripping than actual pain.  He had an MRI of the neck yesterday that was not yet read.  Has a follow-up planned with pain management for this.  He had recent labs checked that showed normal renal function.  Saw dermatology and was started with topical medication appears to be triamcinolone 0.1% for use on the ongoing facial rashes.  So far these have faded in color a little bit but not gone away.  He had follow-up with Dr. Delton Coombes no concern of worsening pulmonary status.  He did have to take antibiotics for dental abscess suspected at the right upper molars.  Previous HPI 08/29/2022 Nathan [name] is a 52 y.o. male here for follow up for sarcoidosis with pulmonary, articular, and cutaneous disease involvement currently on methotrexate 20 mg p.o. weekly folic acid 1 mg daily and prednisone 5 mg daily.  Skin rashes are doing well since her last visit.  [facility] for ongoing neck and upper back pain with trial of steroid injection to the neck with partial benefit.  He is also been recommended if effectiveness is low for possible trigger point injection at multiple sites in the  neck and upper back.  He had increase in cholesterol medication with his PCP onto a atorvastatin and ezetimibe noticing some increased muscle pain and cramping so discontinued the ezetimibe for about 1 week.  At that visit on July 8 had an elevated CK level of 662.  Outside of these areas joint pain is doing well and not noticed any significant swelling in his arms or legs.  Still has some persistent cough swallowing difficulty and shortness of breath with exertion plans for upcoming repeat CT scan and pulmonology follow-up.  He is also been referred for seeing gastroenterology based on previous findings of laryngeal pharyngeal reflux disease.   Previous HPI 05/30/22 Nathan [name] is a 52 y.o. male here for follow up for sarcoidosis with pulmonary articular and cutaneous disease involvement currently on methotrexate 20 mg p.o. weekly folic acid 1 mg daily and prednisone 5 mg daily.  Rash is somewhat improved more since her last visit.  Follow-up with pulmonology clinic not clear whether there is active disease plan for follow-up this summer with repeat lung study to assess this.  He saw the ENT doctor noticed excessive mucus on exam suspicious for reflux for his upper airway cough syndrome issue.  He saw spine specialist for his neck and upper back pain initially started low-dose Lyrica and tramadol  while getting outside records and workup and has follow-up scheduled later this week.     Previous HPI 02/28/22 Nathan [name] is a 52 y.o. male here for follow up for sarcoidosis with pulmonary and cutaneous involvement on methotrexate 15 mg p.o. weekly and folic acid 1 mg daily.  Since her last visit he had PET scan that looked good no evidence of cardiac sarcoidosis activity.  CT scan in October with stable lymphadenopathy and bandlike and nodular opacities in the lungs, although the associated nondiagnostic CT in December was consistent with hypermetabolic mediastinal and hilar lymphadenopathy and  bilateral pulmonary parenchymal opacities consistent with inflammatory process and active granulomatous disease.  He is tolerating the methotrexate without any difficulty.  Still been noticing some ongoing skin rash with minimally raised hyperpigmented spots along the medial border of the eyes.  He has not felt a significant improvement with his neck pain on the medication.  Still feels restriction in his range of motion especially looking towards the right and gets pain painful experiences towards the left side.     Previous HPI 12/19/21 Nathan [name] is a 52 y.o. 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 He has not noticed any problems with the medication but also no improvement in symptoms so far. Still pain worst in the neck and upper back with radiation into back and with headaches. Asthma symptoms doing somewhat worse this year. He had cardiac MRI but is now recommended for cardiac PET scan to evaluate but not scheduled yet.   Previous HPI 11/15/2021 Nathan [name] is a 52 y.o. male here for joint pains with history of pulmonary and cutaneous sacoidosis.  His worst affected area is pretty constant pain in the base of the neck area frequently with radiation up the neck and posterior headaches associated with this.  He has had problems going on for years but seems to be progressively getting somewhat worse.  He does get some pain in extremities elsewhere but by far the most limiting for him is in the base of the neck.  He has tried multiple NSAIDs including meloxicam and Celebrex is also tried muscle relaxant medications none of which were very significantly beneficial.  He is tried physical therapy completing courses and had local injections that did not greatly relieve symptoms.  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  He had most recent neck MRI in 2022 based on  findings had discussed surgery options but is looking to avoid this due to concern about complications or long-term needing additional revision. Original diagnosis of sarcoidosis was based on pulmonary imaging finding and treated with very prolonged prednisone taper.  Initially only had respiratory symptoms with some mild dyspnea and coughing that improved with the steroid treatments.  More recently this year he had an episode of similar symptoms coming back and was treated with a round of steroids from pulmonology clinic in noticed a increase in blood glucose up to 400s.  He is also developed some new skin rashes on the face previously had involvement on extremities but no facial rashes with original symptoms.  He is prescribed topical medication by Dr. Jorja Loa but has not seen any difference so far.   Review of Systems  Constitutional:  Positive for fatigue.  HENT:  Positive for mouth dryness. Negative for mouth sores.   Eyes:  Positive for dryness.  Respiratory:  Positive for shortness of breath.   Cardiovascular:  Positive for chest pain and palpitations.  Gastrointestinal:  Positive for constipation and diarrhea. Negative for blood in stool.  Endocrine: Negative for increased urination.  Genitourinary:  Negative for involuntary urination.  Musculoskeletal:  Positive for joint pain, joint pain, myalgias, muscle weakness, morning stiffness, muscle tenderness and myalgias. Negative for gait problem and joint swelling.  Skin:  Positive for sensitivity to sunlight. Negative for color change, rash and hair loss.  Allergic/Immunologic: Negative for susceptible to infections.  Neurological:  Positive for dizziness and headaches.  Hematological:  Negative for swollen glands.  Psychiatric/Behavioral:  Positive for depressed mood and sleep disturbance. The patient is nervous/anxious.     PMFS History:  Patient Active Problem List   Diagnosis Date Noted   Cervical spine pain 08/29/2022   Myalgia  08/29/2022   High risk medication use 11/15/2021   HLD (hyperlipidemia) 09/13/2021   Asthma 11/10/2020   COVID-19 virus infection 10/21/2019   Shoulder pain, left 10/23/2016   Other chest pain 08/22/2014   Upper airway cough syndrome 07/23/2014   Sarcoidosis 03/06/2011    Past Medical History:  Diagnosis Date   Acid reflux disease    Arthritis    Asthma    'I think I might have asthma"   Chronic back pain    Dyspnea    chronic   Hyperlipidemia    Hypertension    Sarcoidosis     Family History  Problem Relation Age of Onset   Hypertension Mother    Diabetes Mother    Heart attack Father    Diabetes Father    Hypertension Father    Gout Father    Colon polyps Sister    Heart disease Paternal Uncle    Prostate cancer Paternal Uncle    Heart disease Maternal Grandfather    Anemia Daughter    Anemia Daughter    Colon cancer Neg Hx    Esophageal cancer Neg Hx    Rectal cancer Neg Hx    Stomach cancer Neg Hx    Past Surgical History:  Procedure Laterality Date   NO PAST SURGERIES     Social History   Social History Narrative   Not on file   Immunization History  Administered Date(s) Administered   Dtap, Unspecified 11/09/1972, 01/07/1973, 03/01/1973, 03/24/1974, 12/06/1977   Influenza,inj,Quad PF,6+ Mos 11/10/2020, 02/16/2022   Measles 09/23/1973   Mumps 03/24/1974   PFIZER Comirnaty(Gray Top)Covid-19 Tri-Sucrose Vaccine 10/28/2020   PFIZER(Purple Top)SARS-COV-2 Vaccination 10/27/2020   PNEUMOCOCCAL CONJUGATE-20 02/16/2022   Polio, Unspecified 11/09/1972, 01/07/1973, 03/11/1973, 03/24/1974, 12/06/1977   Rubella 09/23/1973   Tdap 02/16/2022     Objective: Vital Signs: BP 113/76 (BP Location: Left Arm, Patient Position: Sitting, Cuff Size: Normal)   Pulse 94   Resp 14   Ht 5\' 7"  (1.702 m)   Wt 231 lb (104.8 kg)   BMI 36.18 kg/m    Physical Exam Eyes:     Conjunctiva/sclera: Conjunctivae normal.  Cardiovascular:     Rate and Rhythm: Normal rate and  regular rhythm.  Pulmonary:     Effort: Pulmonary effort is normal.     Breath sounds: Normal breath sounds.  Lymphadenopathy:     Cervical: No cervical adenopathy.  Skin:    General: Skin is warm and dry.     Findings: Rash present.     Comments: Faintly erythematous rashes along medial border of eyes, mild skin peeling along rash edges  Neurological:     Mental Status:  He is alert.  Psychiatric:        Mood and Affect: Mood normal.     Musculoskeletal Exam:  Neck ROM grossly normal, tenderness to pressure at paraspinal muscles base of neck, tenderness across trapezius muscles without palpable nodules or swelling Elbows full ROM no tenderness or swelling Wrists full ROM no tenderness or swelling Fingers full ROM, no swelling, mild tenderness to pressure on MCP joints b/l No paraspinal tenderness to palpation over upper and lower back Hip normal internal and external rotation without pain, no tenderness to lateral hip palpation Knees full ROM no tenderness or swelling   Investigation: No additional findings.  Imaging: No results found.  Recent Labs: Lab Results  Component Value Date   WBC 7.2 08/29/2022   HGB 13.2 08/29/2022   PLT 264 08/29/2022   NA 138 08/29/2022   K 4.2 08/29/2022   CL 98 08/29/2022   CO2 33 (H) 08/29/2022   GLUCOSE 94 08/29/2022   BUN 14 08/29/2022   CREATININE 0.79 08/29/2022   BILITOT 0.8 08/29/2022   ALKPHOS 92 04/28/2022   AST 27 08/29/2022   ALT 35 08/29/2022   PROT 6.8 08/29/2022   ALBUMIN 4.6 04/28/2022   CALCIUM 9.5 08/29/2022   GFRAA >60 09/17/2019    Speciality Comments: No specialty comments available.  Procedures:  No procedures performed Allergies: Atorvastatin, Rosuvastatin, Tizanidine, Zetia [ezetimibe], Hydrocodone-acetaminophen, Oxycodone-acetaminophen, Percocet [oxycodone-acetaminophen], and Vicodin [hydrocodone-acetaminophen]   Assessment / Plan:     Visit Diagnoses: Sarcoidosis  Disease activity appears  well-controlled stable pulmonary function no peripheral joint swelling and skin rashes are improving.  I think the skin peeling may be some postinflammatory desquamation as the raised papular component is decreased with just flat feeling erythematous patches remaining.  Plan to continue on methotrexate 20 mg p.o. weekly folic acid 1 mg daily and prednisone 5 mg daily.  High risk medication use - methotrexate 20 mg p.o. weekly folic acid 1 mg daily  Recent labs reviewed from earlier this month with renal function panel that was normal.  QuantiFERON was normal.  He did take a course of oral antibiotics for probable dental abscess on the right upper mouth but no other interval infections.  Long term (current) use of systemic steroids - prednisone 5 mg daily.  Cervical spine pain  Continued pain of the neck with radicular symptoms very suspicious for degenerative arthritis in the cervical spine as cause.  Just had MRI but is not yet read.  I definitely think he will need to follow-up with pain management or possibly neurosurgery specialist for this.  I think the low-dose prednisone is probably helping slightly for this but not directly related to his sarcoidosis.   Orders: No orders of the defined types were placed in this encounter.  No orders of the defined types were placed in this encounter.    Follow-Up Instructions: Return in about 3 months (around 03/08/2023) for Sarcoidosis on MTX/GC f/u 3mos.   Fuller Plan, MD  Note - This record has been created using [employer].  Chart creation errors have been sought, but may not always  have been located. Such creation errors do not reflect on  the standard of medical care.

## 2022-11-27 ENCOUNTER — Ambulatory Visit (HOSPITAL_COMMUNITY)
Admission: EM | Admit: 2022-11-27 | Discharge: 2022-11-27 | Disposition: A | Discharge status: Home / Self Care | Payer: Medicaid Other | Attending: Internal Medicine | Admitting: Internal Medicine

## 2022-11-27 ENCOUNTER — Encounter (HOSPITAL_COMMUNITY): Payer: Self-pay | Admitting: Emergency Medicine

## 2022-11-27 DIAGNOSIS — K047 Periapical abscess without sinus: Secondary | ICD-10-CM | POA: Diagnosis not present

## 2022-11-27 DIAGNOSIS — K0889 Other specified disorders of teeth and supporting structures: Secondary | ICD-10-CM

## 2022-11-27 MED ORDER — AMOXICILLIN-POT CLAVULANATE 875-125 MG PO TABS
1.0000 | ORAL_TABLET | Freq: Two times a day (BID) | ORAL | 0 refills | Status: AC
Start: 2022-11-27 — End: 2022-12-07

## 2022-11-27 NOTE — ED Triage Notes (Signed)
Pt has teeth that need to be pulled but has had to postpone due to family issues. Pt states pain on right side has gotten worse since yesterday and he is concerned about an abscess.

## 2022-11-27 NOTE — ED Provider Notes (Signed)
[facility]    CSN: [id number] Arrival date & time: 11/27/22  1308      History   Chief Complaint Chief Complaint  Patient presents with   Dental Pain    HPI Nathan [name] is a 52 y.o. male.   52 yr old male who presents to urgent care with complaints of right upper teeth pain. He has hx of having multiple teeth pulled due to infections. He reports that yesterday he started having right upper teeth pain. He was going to go to the walk in dentist to have several teeth pulled but was worried about trying to go with a possible infection. He denies any jaw swelling, fevers, chills, drainage.   Dental Pain Associated symptoms: no fever     Past Medical History:  Diagnosis Date   Acid reflux disease    Arthritis    Asthma    'I think I might have asthma"   Chronic back pain    Dyspnea    chronic   Hyperlipidemia    Sarcoidosis     Patient Active Problem List   Diagnosis Date Noted   Cervical spine pain 08/29/2022   Myalgia 08/29/2022   High risk medication use 11/15/2021   HLD (hyperlipidemia) 09/13/2021   Asthma 11/10/2020   COVID-19 virus infection 10/21/2019   Shoulder pain, left 10/23/2016   Other chest pain 08/22/2014   Upper airway cough syndrome 07/23/2014   Sarcoidosis 03/06/2011    Past Surgical History:  Procedure Laterality Date   NO PAST SURGERIES         Home Medications    Prior to Admission medications   Medication Sig Start Date End Date Taking? Authorizing Provider  albuterol (VENTOLIN HFA) 108 (90 Base) MCG/ACT inhaler Inhale 2 puffs into the lungs every 6 (six) hours as needed for wheezing or shortness of breath. 03/08/22   Bevelyn Ngo, NP  aspirin EC 81 MG tablet Take 1 tablet (81 mg total) by mouth daily. Swallow whole. 02/28/21   Orbie Pyo, MD  BREZTRI AEROSPHERE 160-9-4.8 MCG/ACT AERO INHALE TWO PUFFS BY MOUTH TWICE A DAY (IN THE MORNING AND AT BEDTIME) 11/08/22   Byrum, Les Pou, MD  buprenorphine (BUTRANS)  15 MCG/HR 1 patch to skin Transdermal every week for 28 days 07/17/22   [provider]  cyclobenzaprine (FLEXERIL) 10 MG tablet  05/18/22   [provider]  Evolocumab (REPATHA SURECLICK) 140 MG/ML SOAJ Inject 140 mg into the skin every 14 (fourteen) days. 11/13/22   Swinyer, Zachary George, NP  folic acid (FOLVITE) 1 MG tablet Take 1 tablet (1 mg total) by mouth daily. 02/28/22   Rice, Jamesetta Orleans, MD  furosemide (LASIX) 20 MG tablet Take 0.5 tablets (10 mg total) by mouth daily. 03/13/22   Orbie Pyo, MD  JANUVIA 50 MG tablet Take 1 tablet by mouth daily. 04/25/22 11/09/22  [provider]  loratadine (CLARITIN) 10 MG tablet Take 1 tablet by mouth daily. 04/20/22 11/09/22  [provider]  metFORMIN (GLUCOPHAGE-XR) 500 MG 24 hr tablet Take 500 mg by mouth 2 (two) times daily. 08/24/21   [provider]  methotrexate (RHEUMATREX) 2.5 MG tablet TAKE EIGHT TABLETS BY MOUTH EVERY WEEK **CAUTION:CHEMOTHERAPY PROTET FROM LIGHT 11/07/22   Rice, Jamesetta Orleans, MD  omeprazole (PRILOSEC) 20 MG capsule Take by mouth. 05/29/22 11/02/22  [provider]  ondansetron (ZOFRAN) 4 MG tablet Take 4 mg by mouth every 8 (eight) hours as needed. 07/27/22   [provider]  polyethylene glycol powder (GLYCOLAX/MIRALAX) 17 GM/SCOOP powder Take by mouth. 11/06/22 02/04/23  [provider]  pravastatin (PRAVACHOL) 10 MG tablet Take 10 mg by mouth daily. 09/14/22   [provider]  predniSONE (DELTASONE) 5 MG tablet TAKE ONE TABLET BY MOUTH ONE TIME DAILY WITH BREAKFAST 11/07/22   Rice, Jamesetta Orleans, MD  pregabalin (LYRICA) 200 MG capsule 1 capsule Oral every 12 hours for 30 days 07/17/22   [provider]  sulfamethoxazole-trimethoprim (BACTRIM DS) 800-160 MG tablet TAKE ONE TABLET BY MOUTH THREE TIMES A WEEK (MON, WED, FRI) 11/07/22   Byrum, Les Pou, MD  tacrolimus (PROTOPIC) 0.1 % ointment Apply topically 2 (two) times daily. 09/07/21   Janalyn Harder, MD  triamcinolone ointment (KENALOG) 0.1 % Apply 1 Application topically 2 (two) times daily.    [provider]  valsartan (DIOVAN) 40 MG tablet Take 1 tablet (40 mg total) by mouth daily. 11/09/22   Swinyer, Zachary George, NP    Family History Family History  Problem Relation Age of Onset   Hypertension Mother    Diabetes Mother    Heart attack Father    Diabetes Father    Hypertension Father    Gout Father    Colon polyps Sister    Heart disease Paternal Uncle    Prostate cancer Paternal Uncle    Heart disease Maternal Grandfather    Anemia Daughter    Anemia Daughter    Colon cancer Neg Hx    Esophageal cancer Neg Hx    Rectal cancer Neg Hx    Stomach cancer Neg Hx     Social History Social History   Tobacco Use   Smoking status: Former    Current packs/day: 0.00    Average packs/day: 1 pack/day for 20.0 years (20.0 ttl pk-yrs)    Types: Cigarettes    Start date: 01/13/1991    Quit date: 01/13/2011    Years since quitting: 11.8    Passive exposure: Past   Smokeless tobacco: Never  Vaping Use   Vaping status: Never Used  Substance Use Topics   Alcohol use: No    Alcohol/week: 0.0 standard drinks of alcohol   Drug use: No     Allergies   Atorvastatin, Rosuvastatin, Tizanidine, Zetia [ezetimibe], Hydrocodone-acetaminophen, Oxycodone-acetaminophen, Percocet [oxycodone-acetaminophen], and Vicodin [hydrocodone-acetaminophen]   Review of Systems Review of Systems  Constitutional:  Negative for chills and fever.  HENT:  Positive for dental problem. Negative for ear pain and sore throat.   Eyes:  Negative for pain and visual disturbance.  Respiratory:  Negative for cough and shortness of breath.   Cardiovascular:  Negative for chest pain and palpitations.  Gastrointestinal:  Negative for abdominal pain and vomiting.  Genitourinary:  Negative for dysuria and hematuria.  Musculoskeletal:  Negative for arthralgias and back pain.  Skin:  Negative for  color change and rash.  Neurological:  Negative for seizures and syncope.  All other systems reviewed and are negative.    Physical Exam Triage Vital Signs ED Triage Vitals  Encounter Vitals Group     BP 11/27/22 1009 123/85     Systolic BP Percentile --      Diastolic BP Percentile --      Pulse Rate 11/27/22 1009 89     Resp 11/27/22 1009 16     Temp 11/27/22 1009 98 F (36.7 C)     Temp src --      SpO2 11/27/22 1009 93 %     Weight --  Height --      Head Circumference --      Peak Flow --      Pain Score 11/27/22 1007 8     Pain Loc --      Pain Education --      Exclude from Growth Chart --    No data found.  Updated Vital Signs BP 123/85 (BP Location: Left Arm)   Pulse 89   Temp 98 F (36.7 C)   Resp 16   SpO2 93%   Visual Acuity Right Eye Distance:   Left Eye Distance:   Bilateral Distance:    Right Eye Near:   Left Eye Near:    Bilateral Near:     Physical Exam Vitals and nursing note reviewed.  Constitutional:      General: He is not in acute distress.    Appearance: He is well-developed.  HENT:     Head: Normocephalic and atraumatic.  Eyes:     Conjunctiva/sclera: Conjunctivae normal.  Cardiovascular:     Rate and Rhythm: Normal rate and regular rhythm.     Heart sounds: No murmur heard. Pulmonary:     Effort: Pulmonary effort is normal. No respiratory distress.     Breath sounds: Normal breath sounds.  Abdominal:     Palpations: Abdomen is soft.     Tenderness: There is no abdominal tenderness.  Musculoskeletal:        General: No swelling.     Cervical back: Neck supple.  Skin:    General: Skin is warm and dry.     Capillary Refill: Capillary refill takes less than 2 seconds.  Neurological:     Mental Status: He is alert.  Psychiatric:        Mood and Affect: Mood normal.      UC Treatments / Results  Labs (all labs ordered are listed, but only abnormal results are displayed) Labs Reviewed - No data to  display  EKG   Radiology No results found.  Procedures Procedures (including critical care time)  Medications Ordered in UC Medications - No data to display  Initial Impression / Assessment and Plan / UC Course  I have reviewed the triage vital signs and the nursing notes.  Pertinent labs & imaging results that were available during my care of the patient were reviewed by me and considered in my medical decision making (see chart for details).     Pain, dental  Dental abscess   Likely early infection of the right upper molars. Will treat with the following: Augmentin 1 tablet twice daily for 10 days Alternate tylenol and ibuprofen for pain Make appointment to follow up with the dental clinic next week.  Return to urgent care or PCP if symptoms worsen or fail to resolve.  Final Clinical Impressions(s) / UC Diagnoses   Final diagnoses:  None   Discharge Instructions   None    ED Prescriptions   None    PDMP not reviewed this encounter.   Landis Martins, [place] 11/27/22 1039

## 2022-11-27 NOTE — Discharge Instructions (Addendum)
Likely early infection of the right upper molars. Will treat with the following: Augmentin 1 tablet twice daily for 10 days Alternate tylenol and ibuprofen for pain Make appointment to follow up with the dental clinic next week.  Return to urgent care or PCP if symptoms worsen or fail to resolve.

## 2022-12-01 ENCOUNTER — Other Ambulatory Visit: Payer: Self-pay | Admitting: Physical Medicine & Rehabilitation

## 2022-12-01 DIAGNOSIS — M5412 Radiculopathy, cervical region: Secondary | ICD-10-CM

## 2022-12-05 ENCOUNTER — Inpatient Hospital Stay
Admission: RE | Admit: 2022-12-05 | Discharge: 2022-12-05 | Discharge status: Home / Self Care | Payer: Medicaid Other | Source: Ambulatory Visit | Attending: Physical Medicine & Rehabilitation | Admitting: Physical Medicine & Rehabilitation

## 2022-12-05 DIAGNOSIS — M5412 Radiculopathy, cervical region: Secondary | ICD-10-CM

## 2022-12-06 ENCOUNTER — Ambulatory Visit: Payer: Medicaid Other | Attending: Internal Medicine | Admitting: Internal Medicine

## 2022-12-06 ENCOUNTER — Encounter: Payer: Self-pay | Admitting: Internal Medicine

## 2022-12-06 VITALS — BP 113/76 | HR 94 | Resp 14 | Ht 67.0 in | Wt 231.0 lb

## 2022-12-06 DIAGNOSIS — Z7952 Long term (current) use of systemic steroids: Secondary | ICD-10-CM | POA: Diagnosis not present

## 2022-12-06 DIAGNOSIS — M791 Myalgia, unspecified site: Secondary | ICD-10-CM

## 2022-12-06 DIAGNOSIS — Z79899 Other long term (current) drug therapy: Secondary | ICD-10-CM

## 2022-12-06 DIAGNOSIS — D869 Sarcoidosis, unspecified: Secondary | ICD-10-CM

## 2022-12-06 DIAGNOSIS — M542 Cervicalgia: Secondary | ICD-10-CM

## 2022-12-12 ENCOUNTER — Other Ambulatory Visit: Payer: Self-pay | Admitting: Internal Medicine

## 2022-12-12 DIAGNOSIS — M542 Cervicalgia: Secondary | ICD-10-CM

## 2023-01-10 DIAGNOSIS — G8929 Other chronic pain: Secondary | ICD-10-CM | POA: Insufficient documentation

## 2023-01-15 ENCOUNTER — Other Ambulatory Visit: Payer: Self-pay | Admitting: *Deleted

## 2023-01-15 ENCOUNTER — Ambulatory Visit: Payer: Medicaid Other | Attending: Nurse Practitioner

## 2023-01-15 DIAGNOSIS — E785 Hyperlipidemia, unspecified: Secondary | ICD-10-CM

## 2023-01-15 DIAGNOSIS — I700 Atherosclerosis of aorta: Secondary | ICD-10-CM

## 2023-01-15 DIAGNOSIS — E7841 Elevated Lipoprotein(a): Secondary | ICD-10-CM

## 2023-01-16 ENCOUNTER — Telehealth: Payer: Self-pay | Admitting: Internal Medicine

## 2023-01-16 LAB — HEPATIC FUNCTION PANEL
ALT: 36 [IU]/L (ref 0–44)
AST: 40 [IU]/L (ref 0–40)
Albumin: 4.6 g/dL (ref 4.1–5.1)
Alkaline Phosphatase: 95 [IU]/L (ref 44–121)
Bilirubin Total: 0.7 mg/dL (ref 0.0–1.2)
Bilirubin, Direct: 0.26 mg/dL (ref 0.00–0.40)
Total Protein: 7.3 g/dL (ref 6.0–8.5)

## 2023-01-16 LAB — LIPID PANEL
Chol/HDL Ratio: 2.9 {ratio} (ref 0.0–5.0)
Cholesterol, Total: 112 mg/dL (ref 100–199)
HDL: 38 mg/dL — ABNORMAL LOW (ref >39)
LDL Chol Calc (NIH): 55 mg/dL (ref 0–99)
Triglycerides: 103 mg/dL (ref 0–149)
VLDL Cholesterol Cal: 19 mg/dL (ref 5–40)

## 2023-01-16 NOTE — Telephone Encounter (Signed)
Spoke with Nathan [name] regarding lab work completed yesterday (12/2). Went over Dr. Trula Ore result note with Nathan [name]. Nathan [name] verbalized understanding and had no further questions. Nathan [name] 6 month f/u appt with Dr. Lynnette Caffey made for 04/25/23 at 2:40 pm.

## 2023-01-16 NOTE — Telephone Encounter (Signed)
Patient is calling to follow up on lab results. Please advise 

## 2023-02-04 ENCOUNTER — Encounter (HOSPITAL_BASED_OUTPATIENT_CLINIC_OR_DEPARTMENT_OTHER): Payer: Self-pay

## 2023-02-04 ENCOUNTER — Emergency Department (HOSPITAL_BASED_OUTPATIENT_CLINIC_OR_DEPARTMENT_OTHER)
Admission: EM | Admit: 2023-02-04 | Discharge: 2023-02-04 | Disposition: A | Discharge status: Home / Self Care | Payer: Medicaid Other | Attending: Emergency Medicine | Admitting: Emergency Medicine

## 2023-02-04 ENCOUNTER — Other Ambulatory Visit: Payer: Self-pay

## 2023-02-04 ENCOUNTER — Emergency Department (HOSPITAL_BASED_OUTPATIENT_CLINIC_OR_DEPARTMENT_OTHER): Payer: Medicaid Other

## 2023-02-04 DIAGNOSIS — I10 Essential (primary) hypertension: Secondary | ICD-10-CM | POA: Diagnosis not present

## 2023-02-04 DIAGNOSIS — M5412 Radiculopathy, cervical region: Secondary | ICD-10-CM | POA: Diagnosis not present

## 2023-02-04 DIAGNOSIS — Z7984 Long term (current) use of oral hypoglycemic drugs: Secondary | ICD-10-CM | POA: Insufficient documentation

## 2023-02-04 DIAGNOSIS — J45909 Unspecified asthma, uncomplicated: Secondary | ICD-10-CM | POA: Insufficient documentation

## 2023-02-04 DIAGNOSIS — Z7951 Long term (current) use of inhaled steroids: Secondary | ICD-10-CM | POA: Insufficient documentation

## 2023-02-04 DIAGNOSIS — Z7982 Long term (current) use of aspirin: Secondary | ICD-10-CM | POA: Diagnosis not present

## 2023-02-04 DIAGNOSIS — Z79899 Other long term (current) drug therapy: Secondary | ICD-10-CM | POA: Insufficient documentation

## 2023-02-04 DIAGNOSIS — M79602 Pain in left arm: Secondary | ICD-10-CM | POA: Diagnosis present

## 2023-02-04 LAB — D-DIMER, QUANTITATIVE: D-Dimer, Quant: 0.32 ug{FEU}/mL (ref 0.00–0.50)

## 2023-02-04 LAB — CBC WITH DIFFERENTIAL/PLATELET
Abs Immature Granulocytes: 0.02 10*3/uL (ref 0.00–0.07)
Basophils Absolute: 0 10*3/uL (ref 0.0–0.1)
Basophils Relative: 1 %
Eosinophils Absolute: 0.3 10*3/uL (ref 0.0–0.5)
Eosinophils Relative: 5 %
HCT: 38.1 % — ABNORMAL LOW (ref 39.0–52.0)
Hemoglobin: 12.8 g/dL — ABNORMAL LOW (ref 13.0–17.0)
Immature Granulocytes: 0 %
Lymphocytes Relative: 18 %
Lymphs Abs: 1.1 10*3/uL (ref 0.7–4.0)
MCH: 28.5 pg (ref 26.0–34.0)
MCHC: 33.6 g/dL (ref 30.0–36.0)
MCV: 84.9 fL (ref 80.0–100.0)
Monocytes Absolute: 0.8 10*3/uL (ref 0.1–1.0)
Monocytes Relative: 12 %
Neutro Abs: 4 10*3/uL (ref 1.7–7.7)
Neutrophils Relative %: 64 %
Platelets: 276 10*3/uL (ref 150–400)
RBC: 4.49 MIL/uL (ref 4.22–5.81)
RDW: 14 % (ref 11.5–15.5)
WBC: 6.2 10*3/uL (ref 4.0–10.5)
nRBC: 0 % (ref 0.0–0.2)

## 2023-02-04 LAB — TROPONIN I (HIGH SENSITIVITY): Troponin I (High Sensitivity): 6 ng/L (ref <18)

## 2023-02-04 LAB — BASIC METABOLIC PANEL
Anion gap: 9 (ref 5–15)
BUN: 13 mg/dL (ref 6–20)
CO2: 28 mmol/L (ref 22–32)
Calcium: 9 mg/dL (ref 8.9–10.3)
Chloride: 104 mmol/L (ref 98–111)
Creatinine, Ser: 0.76 mg/dL (ref 0.61–1.24)
GFR, Estimated: >60 mL/min (ref >60)
Glucose, Bld: 135 mg/dL — ABNORMAL HIGH (ref 70–99)
Potassium: 3.3 mmol/L — ABNORMAL LOW (ref 3.5–5.1)
Sodium: 141 mmol/L (ref 135–145)

## 2023-02-04 MED ORDER — METHOCARBAMOL 500 MG PO TABS: 500.0000 mg | ORAL_TABLET | Freq: Three times a day (TID) | ORAL | 0 refills | Status: DC | PRN

## 2023-02-04 MED ORDER — HYDROMORPHONE HCL 1 MG/ML IJ SOLN
1.0000 mg | Freq: Once | INTRAMUSCULAR | Status: AC
  Administered 2023-02-04: 1 mg via INTRAVENOUS
  Filled 2023-02-04: qty 1

## 2023-02-04 MED ORDER — DIAZEPAM 2 MG PO TABS
2.0000 mg | ORAL_TABLET | Freq: Once | ORAL | Status: AC
  Administered 2023-02-04: 2 mg via ORAL
  Filled 2023-02-04: qty 1

## 2023-02-04 MED ORDER — METHOCARBAMOL 500 MG PO TABS
500.0000 mg | ORAL_TABLET | Freq: Once | ORAL | Status: AC
  Administered 2023-02-04: 500 mg via ORAL
  Filled 2023-02-04: qty 1

## 2023-02-04 MED ORDER — METHYLPREDNISOLONE 4 MG PO TBPK: ORAL_TABLET | ORAL | 0 refills | Status: DC

## 2023-02-04 MED ORDER — IOHEXOL 350 MG/ML SOLN
100.0000 mL | Freq: Once | INTRAVENOUS | Status: AC | PRN
  Administered 2023-02-04: 75 mL via INTRAVENOUS

## 2023-02-04 NOTE — Discharge Instructions (Signed)
Take the steroids as prescribed.  Go back to your normal dose of prednisone after finishing the methylprednisolone.  Follow-up with your pain clinic for further evaluation of your pain.  Return to the ED with new weakness, numbness, tingling, difficulty breathing, chest pain or other concerns

## 2023-02-04 NOTE — ED Provider Notes (Signed)
[place] EMERGENCY DEPARTMENT AT [facility] Provider Note   CSN: [id number] Arrival date & time: 02/04/23  0056     History  Chief Complaint  Patient presents with   Arm Pain    L    Nathan [name] is a 52 y.o. male.  Patient with a history of sarcoidosis on chronic prednisone, chronic back pain, asthma, arthritis and hypertension presents with 2 weeks of worsening "pinched nerve" to the left neck and upper back.  Reports ongoing pain to left neck and upper back.  Has been constant for many months.  He sees a pain specialist and had a epidural shot about a week ago.  After the shot resolved he started having increased pain to his left arm and neck and the side of his area pinched nerve.  Denies any new fall or injury.  Pain is to his left neck and upper back and radiates down his entire left arm.  He feels weak and numb and tingling in his left arm but not significantly changed from baseline.  No fevers, chills, nausea, vomiting.  No chest pain or shortness of breath.  Does feel some pain with breathing.  Does have a history of sarcoidosis. No headache or visual change He denies any new weakness, numbness or tingling.  No bowel or bladder incontinence.  No fever or vomiting.  No history of IV drug abuse or cancer.  He states at home for pain he has a buprenorphine patch as well as Celebrex and amitriptyline.  He cannot take hydrocodone oxycodone due to itching.  The history is provided by the patient.  Arm Pain Pertinent negatives include no chest pain, no abdominal pain, no headaches and no shortness of breath.       Home Medications Prior to Admission medications   Medication Sig Start Date End Date Taking? Authorizing Provider  albuterol (VENTOLIN HFA) 108 (90 Base) MCG/ACT inhaler Inhale 2 puffs into the lungs every 6 (six) hours as needed for wheezing or shortness of breath. 03/08/22   Bevelyn Ngo, NP  aspirin EC 81 MG tablet Take 1 tablet (81 mg total) by  mouth daily. Swallow whole. 02/28/21   Orbie Pyo, MD  BREZTRI AEROSPHERE 160-9-4.8 MCG/ACT AERO INHALE TWO PUFFS BY MOUTH TWICE A DAY (IN THE MORNING AND AT BEDTIME) 11/08/22   Byrum, Les Pou, MD  buprenorphine (BUTRANS) 15 MCG/HR 1 patch to skin Transdermal every week for 28 days 07/17/22   [provider]  cyclobenzaprine (FLEXERIL) 10 MG tablet  05/18/22   [provider]  Evolocumab (REPATHA SURECLICK) 140 MG/ML SOAJ Inject 140 mg into the skin every 14 (fourteen) days. 11/13/22   Swinyer, Zachary George, NP  folic acid (FOLVITE) 1 MG tablet Take 1 tablet (1 mg total) by mouth daily. 02/28/22   Rice, Jamesetta Orleans, MD  furosemide (LASIX) 20 MG tablet Take 0.5 tablets (10 mg total) by mouth daily. 03/13/22   Orbie Pyo, MD  hydrocortisone 2.5 % cream Apply topically. 11/10/22   [provider]  JANUVIA 50 MG tablet Take 1 tablet by mouth daily. 04/25/22 11/09/22  [provider]  loratadine (CLARITIN) 10 MG tablet Take 1 tablet by mouth daily. 04/20/22 11/09/22  [provider]  metFORMIN (GLUCOPHAGE-XR) 500 MG 24 hr tablet Take 500 mg by mouth 2 (two) times daily. 08/24/21   [provider]  methotrexate (RHEUMATREX) 2.5 MG tablet TAKE EIGHT TABLETS BY MOUTH EVERY WEEK **CAUTION:CHEMOTHERAPY PROTET FROM LIGHT 11/07/22   Rice,  Jamesetta Orleans, MD  omeprazole (PRILOSEC) 20 MG capsule Take by mouth. 05/29/22 11/02/22  [provider]  ondansetron (ZOFRAN) 4 MG tablet Take 4 mg by mouth every 8 (eight) hours as needed. 07/27/22   [provider]  polyethylene glycol powder (GLYCOLAX/MIRALAX) 17 GM/SCOOP powder Take by mouth. 11/06/22 02/04/23  [provider]  pravastatin (PRAVACHOL) 10 MG tablet Take 10 mg by mouth daily. 09/14/22   [provider]  predniSONE (DELTASONE) 5 MG tablet TAKE ONE TABLET BY MOUTH ONE TIME DAILY WITH BREAKFAST 11/07/22   Rice, Jamesetta Orleans, MD  pregabalin (LYRICA) 200 MG capsule 1 capsule Oral  every 12 hours for 30 days 07/17/22   [provider]  sulfamethoxazole-trimethoprim (BACTRIM DS) 800-160 MG tablet TAKE ONE TABLET BY MOUTH THREE TIMES A WEEK (MON, WED, FRI) 11/07/22   Byrum, Les Pou, MD  tacrolimus (PROTOPIC) 0.1 % ointment Apply topically 2 (two) times daily. 09/07/21   Janalyn Harder, MD  triamcinolone ointment (KENALOG) 0.1 % Apply 1 Application topically 2 (two) times daily.    [provider]  valsartan (DIOVAN) 40 MG tablet Take 1 tablet (40 mg total) by mouth daily. 11/09/22   Swinyer, Zachary George, NP      Allergies    Atorvastatin, Rosuvastatin, Tizanidine, Zetia [ezetimibe], Hydrocodone-acetaminophen, Oxycodone-acetaminophen, Percocet [oxycodone-acetaminophen], and Vicodin [hydrocodone-acetaminophen]    Review of Systems   Review of Systems  Constitutional:  Negative for activity change, appetite change and fever.  HENT:  Negative for congestion and rhinorrhea.   Respiratory:  Negative for cough, chest tightness and shortness of breath.   Cardiovascular:  Negative for chest pain.  Gastrointestinal:  Negative for abdominal pain, nausea and vomiting.  Musculoskeletal:  Positive for arthralgias, back pain, myalgias and neck pain.  Skin:  Negative for rash.  Neurological:  Positive for weakness and numbness. Negative for dizziness and headaches.   all other systems are negative except as noted in the HPI and PMH.    Physical Exam Updated Vital Signs BP (!) 137/93   Pulse 100   Temp 98.9 F (37.2 C)   Resp 16   SpO2 100%  Physical Exam Vitals and nursing note reviewed.  Constitutional:      General: He is not in acute distress.    Appearance: He is well-developed.  HENT:     Head: Normocephalic and atraumatic.     Mouth/Throat:     Pharynx: No oropharyngeal exudate.  Eyes:     Conjunctiva/sclera: Conjunctivae normal.     Pupils: Pupils are equal, round, and reactive to light.  Neck:     Comments: No meningismus. Paraspinal C-spine  tenderness, no midline tenderness Cardiovascular:     Rate and Rhythm: Normal rate and regular rhythm.     Heart sounds: Normal heart sounds. No murmur heard. Pulmonary:     Effort: Pulmonary effort is normal. No respiratory distress.     Breath sounds: Normal breath sounds.  Abdominal:     Palpations: Abdomen is soft.     Tenderness: There is no abdominal tenderness. There is no guarding or rebound.  Musculoskeletal:        General: No tenderness. Normal range of motion.     Cervical back: Normal range of motion and neck supple.     Comments: Equal grip strength and radial pulses bilaterally.  Equal sensation bilaterally   Skin:    General: Skin is warm.  Neurological:     Mental Status: He is alert and oriented to person, place, and  time.     Cranial Nerves: No cranial nerve deficit.     Motor: No abnormal muscle tone.     Coordination: Coordination normal.     Comments:  5/5 strength throughout. CN 2-12 intact.Equal grip strength.   Psychiatric:        Behavior: Behavior normal.     ED Results / Procedures / Treatments   Labs (all labs ordered are listed, but only abnormal results are displayed) Labs Reviewed  CBC WITH DIFFERENTIAL/PLATELET - Abnormal; Notable for the following components:      Result Value   Hemoglobin 12.8 (*)    HCT 38.1 (*)    All other components within normal limits  BASIC METABOLIC PANEL - Abnormal; Notable for the following components:   Potassium 3.3 (*)    Glucose, Bld 135 (*)    All other components within normal limits  D-DIMER, QUANTITATIVE  TROPONIN I (HIGH SENSITIVITY)  TROPONIN I (HIGH SENSITIVITY)    EKG EKG Interpretation Date/Time:  Sunday February 04 2023 01:47:38 EST Ventricular Rate:  88 PR Interval:  184 QRS Duration:  101 QT Interval:  361 QTC Calculation: 437 R Axis:   22  Text Interpretation: Sinus rhythm No significant change was found Confirmed by Glynn Octave [phone]) on 02/04/2023 1:56:33 AM  Radiology CT  Angio Chest PE W and/or Wo Contrast Result Date: 02/04/2023 CLINICAL DATA:  Pulmonary embolism suspected, high probability. EXAM: CT ANGIOGRAPHY CHEST WITH CONTRAST TECHNIQUE: Multidetector CT imaging of the chest was performed using the standard protocol during bolus administration of intravenous contrast. Multiplanar CT image reconstructions and MIPs were obtained to evaluate the vascular anatomy. RADIATION DOSE REDUCTION: This exam was performed according to the departmental dose-optimization program which includes automated exposure control, adjustment of the mA and/or kV according to patient size and/or use of iterative reconstruction technique. CONTRAST:  75mL OMNIPAQUE IOHEXOL 350 MG/ML SOLN COMPARISON:  10/19/2022 chest CT. FINDINGS: Cardiovascular: Satisfactory opacification of the pulmonary arteries to the segmental level. No evidence of pulmonary embolism. Normal heart size. No pericardial effusion. Mediastinum/Nodes: Enlarged mediastinal and hilar lymph nodes with granulomatous type calcification. Adenopathy is long-standing and pattern correlates well with history of sarcoid. Lungs/Pleura: Bands of opacity and lymphatic pattern (subpleural and peribronchovascular) nodularity in the bilateral lungs greatest in the apical lungs with some paraseptal fibrosis/emphysema at the apices. The dominant nodule in the subpleural left upper lobe on 8:52 has been seen since at least 2012 with mild growth, compatible with a generalized disease process. No superimposed pneumonia, edema, effusion, or air leak. Upper Abdomen: No acute finding Musculoskeletal: Generalized thoracic disc narrowing and gas containing fissures. No evidence of fracture or bone lesion. Review of the MIP images confirms the above findings. IMPRESSION: 1. Negative for pulmonary embolism. 2. Nodal and pulmonary sarcoid without acute superimposed finding. Electronically Signed   By: Tiburcio Pea M.D.   On: 02/04/2023 04:19   CT Cervical  Spine Wo Contrast Result Date: 02/04/2023 CLINICAL DATA:  Cervical radiculopathy. Pinched nerve in the left upper extremity for awhile EXAM: CT CERVICAL SPINE WITHOUT CONTRAST TECHNIQUE: Multidetector CT imaging of the cervical spine was performed without intravenous contrast. Multiplanar CT image reconstructions were also generated. RADIATION DOSE REDUCTION: This exam was performed according to the departmental dose-optimization program which includes automated exposure control, adjustment of the mA and/or kV according to patient size and/or use of iterative reconstruction technique. COMPARISON:  12/05/2022 cervical MRI. FINDINGS: Alignment: Straightening of cervical lordosis Skull base and vertebrae: No acute fracture. No primary bone lesion  or focal pathologic process. Soft tissues and spinal canal: No prevertebral fluid or swelling. No visible canal hematoma. Disc levels: Disc space narrowing with ridging and bulging especially at C5-6 to C7-T1, greatest at C5-6 where there is a small central protrusion and uncovertebral ridging encroaching on the bilateral foramina to a moderate degree. Underestimated stenosis at this level when compared to comparison MRI. Upper chest: Nodular and bandlike opacities in the apical lungs, there is pending chest CT. IMPRESSION: 1. No acute finding. 2. Cervical spine degeneration greatest at C5-6 where there is spinal canal and foraminal narrowing. No detected change from cervical MRI 12/05/2022. Electronically Signed   By: Tiburcio Pea M.D.   On: 02/04/2023 04:13    Procedures Procedures    Medications Ordered in ED Medications - No data to display  ED Course/ Medical Decision Making/ A&P                                 Medical Decision Making Amount and/or Complexity of Data Reviewed Labs: ordered. Decision-making details documented in ED Course. Radiology: ordered and independent interpretation performed. Decision-making details documented in ED  Course. ECG/medicine tests: ordered and independent interpretation performed. Decision-making details documented in ED Course.  Risk Prescription drug management.   Chronic neck and upper back pain.  History of cervical spinal stenosis as well as foraminal narrowing.  MRI from October 2024 reviewed.  No new trauma.  No new weakness, numbness or tingling.  Equal grip strength on exam.  EKG without acute ischemia.  Low suspicion for acute ACS or PE.  Low suspicion for cord compression or cauda equina.  MRI 12/05/22 IMPRESSION: 1. C5-C6 moderate to severe spinal canal stenosis and moderate bilateral neural foraminal narrowing. 2. C4-C5 mild-to-moderate spinal canal stenosis and mild left neural foraminal narrowing. 3. C6-C7 moderate to severe right neural foraminal narrowing. 4. C3-C4 mild spinal canal stenosis. 5. C7-T1 mild left neural foraminal narrowing.   CT today without acute findings.  No evidence of pulmonary embolism or aortic dissection.  Will give course of steroids and muscle relaxers and follow-up with pain clinic.  Low suspicion for cord compression or cauda equina or acute spinal cord emergency today.  No evidence of ACS or PE.  No evidence of acute spinal cord emergency.  Take the steroids and muscle relaxers as prescribed.  Then go back to his usual dose of prednisone.  Follow-up with the pain specialist.  Return to the ED with new or worsening symptoms including new weakness, numbness, tingling, other concerns.       Final Clinical Impression(s) / ED Diagnoses Final diagnoses:  Cervical radiculopathy    Rx / DC Orders ED Discharge Orders     None         Asaph Serena, Jeannett Senior, MD 02/04/23 [phone]

## 2023-02-04 NOTE — ED Triage Notes (Signed)
Pt c/o pinched nerve pain in LUE x "awhile." Sees pain specialist, steriod epidural last Thursday. Advises that immediately thereafter, "pain everywhere," since resolved but pinched nerve feeling is back. Advises that all prescribed medications "just aren't close to touching it."  Last MRI 10/22

## 2023-02-12 ENCOUNTER — Other Ambulatory Visit: Payer: Self-pay | Admitting: Emergency Medicine

## 2023-02-22 NOTE — Progress Notes (Deleted)
 Office Visit Note  Patient: Nathan [name]             Date of Birth: [birth year]           MRN: [id number]             PCP: Pcp, No Referring: No ref. provider found Visit Date: 03/08/2023   Subjective:  No chief complaint on file.   History of Present Illness: Nathan [name] is a 53 y.o. male here for follow up  for sarcoidosis with pulmonary, articular, and cutaneous disease involvement currently on methotrexate 20 mg p.o. weekly folic acid 1 mg daily and prednisone 5 mg daily.    Previous HPI 12/06/2022 Nathan [name] is a 53 y.o. male here for follow up for sarcoidosis with pulmonary, articular, and cutaneous disease involvement currently on methotrexate 20 mg p.o. weekly folic acid 1 mg daily and prednisone 5 mg daily.  He has had persistent pain in his neck and some shooting radiating type pain more down the right arm than down the left arm.  He is experiencing stiffness and numbness in the hands more after prolonged use especially holding a tight grip.  A little bit of pain around the MCP joints of the hand otherwise more just trouble gripping than actual pain.  He had an MRI of the neck yesterday that was not yet read.  Has a follow-up planned with pain management for this.  He had recent labs checked that showed normal renal function.  Saw dermatology and was started with topical medication appears to be triamcinolone 0.1% for use on the ongoing facial rashes.  So far these have faded in color a little bit but not gone away.  He had follow-up with Dr. Delton Coombes no concern of worsening pulmonary status.  He did have to take antibiotics for dental abscess suspected at the right upper molars.   Previous HPI 08/29/2022 Nathan [name] is a 53 y.o. male here for follow up for sarcoidosis with pulmonary, articular, and cutaneous disease involvement currently on methotrexate 20 mg p.o. weekly folic acid 1 mg daily and prednisone 5 mg daily.  Skin rashes are doing well since  her last visit.  [facility] for ongoing neck and upper back pain with trial of steroid injection to the neck with partial benefit.  He is also been recommended if effectiveness is low for possible trigger point injection at multiple sites in the neck and upper back.  He had increase in cholesterol medication with his PCP onto a atorvastatin and ezetimibe noticing some increased muscle pain and cramping so discontinued the ezetimibe for about 1 week.  At that visit on July 8 had an elevated CK level of 662.  Outside of these areas joint pain is doing well and not noticed any significant swelling in his arms or legs.  Still has some persistent cough swallowing difficulty and shortness of breath with exertion plans for upcoming repeat CT scan and pulmonology follow-up.  He is also been referred for seeing gastroenterology based on previous findings of laryngeal pharyngeal reflux disease.   Previous HPI 05/30/22 Nathan [name] is a 53 y.o. male here for follow up for sarcoidosis with pulmonary articular and cutaneous disease involvement currently on methotrexate 20 mg p.o. weekly folic acid 1 mg daily and prednisone 5 mg daily.  Rash is somewhat improved more since her last visit.  Follow-up with pulmonology clinic not clear whether there is active disease plan  for follow-up this summer with repeat lung study to assess this.  He saw the ENT doctor noticed excessive mucus on exam suspicious for reflux for his upper airway cough syndrome issue.  He saw spine specialist for his neck and upper back pain initially started low-dose Lyrica and tramadol while getting outside records and workup and has follow-up scheduled later this week.     Previous HPI 02/28/22 Nathan [name] is a 53 y.o. male here for follow up for sarcoidosis with pulmonary and cutaneous involvement on methotrexate 15 mg p.o. weekly and folic acid 1 mg daily.  Since her last visit he had PET scan that looked  good no evidence of cardiac sarcoidosis activity.  CT scan in October with stable lymphadenopathy and bandlike and nodular opacities in the lungs, although the associated nondiagnostic CT in December was consistent with hypermetabolic mediastinal and hilar lymphadenopathy and bilateral pulmonary parenchymal opacities consistent with inflammatory process and active granulomatous disease.  He is tolerating the methotrexate without any difficulty.  Still been noticing some ongoing skin rash with minimally raised hyperpigmented spots along the medial border of the eyes.  He has not felt a significant improvement with his neck pain on the medication.  Still feels restriction in his range of motion especially looking towards the right and gets pain painful experiences towards the left side.     Previous HPI 12/19/21 Nathan [name] is a 53 y.o. 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 He has not noticed any problems with the medication but also no improvement in symptoms so far. Still pain worst in the neck and upper back with radiation into back and with headaches. Asthma symptoms doing somewhat worse this year. He had cardiac MRI but is now recommended for cardiac PET scan to evaluate but not scheduled yet.   Previous HPI 11/15/2021 Nathan [name] is a 53 y.o. male here for joint pains with history of pulmonary and cutaneous sacoidosis.  His worst affected area is pretty constant pain in the base of the neck area frequently with radiation up the neck and posterior headaches associated with this.  He has had problems going on for years but seems to be progressively getting somewhat worse.  He does get some pain in extremities elsewhere but by far the most limiting for him is in the base of the neck.  He has tried multiple NSAIDs including meloxicam and Celebrex is also tried muscle relaxant  medications none of which were very significantly beneficial.  He is tried physical therapy completing courses and had local injections that did not greatly relieve symptoms.  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  He had most recent neck MRI in 2022 based on findings had discussed surgery options but is looking to avoid this due to concern about complications or long-term needing additional revision. Original diagnosis of sarcoidosis was based on pulmonary imaging finding and treated with very prolonged prednisone taper.  Initially only had respiratory symptoms with some mild dyspnea and coughing that improved with the steroid treatments.  More recently this year he had an episode of similar symptoms coming back and was treated with a round of steroids from pulmonology clinic in noticed a increase in blood glucose up to 400s.  He is also developed some new skin rashes on the face previously had involvement on extremities but no facial rashes with original  symptoms.  He is prescribed topical medication by Dr. Jorja Loa but has not seen any difference so far.   No Rheumatology ROS completed.   PMFS History:  Patient Active Problem List   Diagnosis Date Noted   Cervical spine pain 08/29/2022   Myalgia 08/29/2022   High risk medication use 11/15/2021   HLD (hyperlipidemia) 09/13/2021   Asthma 11/10/2020   COVID-19 virus infection 10/21/2019   Shoulder pain, left 10/23/2016   Other chest pain 08/22/2014   Upper airway cough syndrome 07/23/2014   Sarcoidosis 03/06/2011    Past Medical History:  Diagnosis Date   Acid reflux disease    Arthritis    Asthma    'I think I might have asthma"   Chronic back pain    Dyspnea    chronic   Hyperlipidemia    Hypertension    Sarcoidosis     Family History  Problem Relation Age of Onset   Hypertension Mother    Diabetes Mother    Heart attack Father    Diabetes Father    Hypertension Father    Gout Father    Colon polyps Sister     Heart disease Paternal Uncle    Prostate cancer Paternal Uncle    Heart disease Maternal Grandfather    Anemia Daughter    Anemia Daughter    Colon cancer Neg Hx    Esophageal cancer Neg Hx    Rectal cancer Neg Hx    Stomach cancer Neg Hx    Past Surgical History:  Procedure Laterality Date   NO PAST SURGERIES     Social History   Social History Narrative   Not on file   Immunization History  Administered Date(s) Administered   Dtap, Unspecified 11/09/1972, 01/07/1973, 03/01/1973, 03/24/1974, 12/06/1977   Influenza,inj,Quad PF,6+ Mos 11/10/2020, 02/16/2022   Measles 09/23/1973   Mumps 03/24/1974   PFIZER Comirnaty(Gray Top)Covid-19 Tri-Sucrose Vaccine 10/28/2020   PFIZER(Purple Top)SARS-COV-2 Vaccination 10/27/2020   PNEUMOCOCCAL CONJUGATE-20 02/16/2022   Polio, Unspecified 11/09/1972, 01/07/1973, 03/11/1973, 03/24/1974, 12/06/1977   Rubella 09/23/1973   Tdap 02/16/2022     Objective: Vital Signs: There were no vitals taken for this visit.   Physical Exam   Musculoskeletal Exam: ***  CDAI Exam: CDAI Score: -- Patient Global: --; Provider Global: -- Swollen: --; Tender: -- Joint Exam 03/08/2023   No joint exam has been documented for this visit   There is currently no information documented on the homunculus. Go to the Rheumatology activity and complete the homunculus joint exam.  Investigation: No additional findings.  Imaging: CT Angio Chest PE W and/or Wo Contrast Result Date: 02/04/2023 CLINICAL DATA:  Pulmonary embolism suspected, high probability. EXAM: CT ANGIOGRAPHY CHEST WITH CONTRAST TECHNIQUE: Multidetector CT imaging of the chest was performed using the standard protocol during bolus administration of intravenous contrast. Multiplanar CT image reconstructions and MIPs were obtained to evaluate the vascular anatomy. RADIATION DOSE REDUCTION: This exam was performed according to the departmental dose-optimization program which includes automated  exposure control, adjustment of the mA and/or kV according to patient size and/or use of iterative reconstruction technique. CONTRAST:  75mL OMNIPAQUE IOHEXOL 350 MG/ML SOLN COMPARISON:  10/19/2022 chest CT. FINDINGS: Cardiovascular: Satisfactory opacification of the pulmonary arteries to the segmental level. No evidence of pulmonary embolism. Normal heart size. No pericardial effusion. Mediastinum/Nodes: Enlarged mediastinal and hilar lymph nodes with granulomatous type calcification. Adenopathy is long-standing and pattern correlates well with history of sarcoid. Lungs/Pleura: Bands of opacity and lymphatic pattern (subpleural and peribronchovascular) nodularity in  the bilateral lungs greatest in the apical lungs with some paraseptal fibrosis/emphysema at the apices. The dominant nodule in the subpleural left upper lobe on 8:52 has been seen since at least 2012 with mild growth, compatible with a generalized disease process. No superimposed pneumonia, edema, effusion, or air leak. Upper Abdomen: No acute finding Musculoskeletal: Generalized thoracic disc narrowing and gas containing fissures. No evidence of fracture or bone lesion. Review of the MIP images confirms the above findings. IMPRESSION: 1. Negative for pulmonary embolism. 2. Nodal and pulmonary sarcoid without acute superimposed finding. Electronically Signed   By: Tiburcio Pea M.D.   On: 02/04/2023 04:19   CT Cervical Spine Wo Contrast Result Date: 02/04/2023 CLINICAL DATA:  Cervical radiculopathy. Pinched nerve in the left upper extremity for awhile EXAM: CT CERVICAL SPINE WITHOUT CONTRAST TECHNIQUE: Multidetector CT imaging of the cervical spine was performed without intravenous contrast. Multiplanar CT image reconstructions were also generated. RADIATION DOSE REDUCTION: This exam was performed according to the departmental dose-optimization program which includes automated exposure control, adjustment of the mA and/or kV according to patient  size and/or use of iterative reconstruction technique. COMPARISON:  12/05/2022 cervical MRI. FINDINGS: Alignment: Straightening of cervical lordosis Skull base and vertebrae: No acute fracture. No primary bone lesion or focal pathologic process. Soft tissues and spinal canal: No prevertebral fluid or swelling. No visible canal hematoma. Disc levels: Disc space narrowing with ridging and bulging especially at C5-6 to C7-T1, greatest at C5-6 where there is a small central protrusion and uncovertebral ridging encroaching on the bilateral foramina to a moderate degree. Underestimated stenosis at this level when compared to comparison MRI. Upper chest: Nodular and bandlike opacities in the apical lungs, there is pending chest CT. IMPRESSION: 1. No acute finding. 2. Cervical spine degeneration greatest at C5-6 where there is spinal canal and foraminal narrowing. No detected change from cervical MRI 12/05/2022. Electronically Signed   By: Tiburcio Pea M.D.   On: 02/04/2023 04:13    Recent Labs: Lab Results  Component Value Date   WBC 6.2 02/04/2023   HGB 12.8 (L) 02/04/2023   PLT 276 02/04/2023   NA 141 02/04/2023   K 3.3 (L) 02/04/2023   CL 104 02/04/2023   CO2 28 02/04/2023   GLUCOSE 135 (H) 02/04/2023   BUN 13 02/04/2023   CREATININE 0.76 02/04/2023   BILITOT 0.7 01/15/2023   ALKPHOS 95 01/15/2023   AST 40 01/15/2023   ALT 36 01/15/2023   PROT 7.3 01/15/2023   ALBUMIN 4.6 01/15/2023   CALCIUM 9.0 02/04/2023   GFRAA >60 09/17/2019    Speciality Comments: No specialty comments available.  Procedures:  No procedures performed Allergies: Atorvastatin, Rosuvastatin, Tizanidine, Zetia [ezetimibe], Hydrocodone-acetaminophen, Oxycodone-acetaminophen, Percocet [oxycodone-acetaminophen], and Vicodin [hydrocodone-acetaminophen]   Assessment / Plan:     Visit Diagnoses: No diagnosis found.  ***  Orders: No orders of the defined types were placed in this encounter.  No orders of the defined  types were placed in this encounter.    Follow-Up Instructions: No follow-ups on file.   Metta Clines, RT  Note - This record has been created using [employer].  Chart creation errors have been sought, but may not always  have been located. Such creation errors do not reflect on  the standard of medical care.

## 2023-03-05 ENCOUNTER — Other Ambulatory Visit: Payer: Self-pay | Admitting: Internal Medicine

## 2023-03-05 DIAGNOSIS — D869 Sarcoidosis, unspecified: Secondary | ICD-10-CM

## 2023-03-05 NOTE — Telephone Encounter (Signed)
Last Fill: 11/07/2022  Labs: 02/04/2023  Hemoglobin 12.8 HCT 38.1 Potassium 3.3 Glucose 135  Next Visit: 03/08/2023  Last Visit: 12/06/2022  DX: Sarcoidosis   Current Dose per office note 12/06/2022: methotrexate 20 mg p.o. weekly   Okay to refill Methotrexate?

## 2023-03-08 ENCOUNTER — Ambulatory Visit: Payer: Medicaid Other | Admitting: Internal Medicine

## 2023-03-08 DIAGNOSIS — D869 Sarcoidosis, unspecified: Secondary | ICD-10-CM

## 2023-03-08 DIAGNOSIS — Z7952 Long term (current) use of systemic steroids: Secondary | ICD-10-CM

## 2023-03-08 DIAGNOSIS — M542 Cervicalgia: Secondary | ICD-10-CM

## 2023-03-08 DIAGNOSIS — Z79899 Other long term (current) drug therapy: Secondary | ICD-10-CM

## 2023-03-08 NOTE — Progress Notes (Signed)
 Office Visit Note  Patient: Nathan [name]             Date of Birth: August 11, 1970           MRN: [id number]             PCP: Pcp, No Referring: No ref. provider found Visit Date: 03/21/2023   Subjective:  Follow-up   History of Present Illness:  Discussed the use of AI scribe software for clinical note transcription with the patient, who gave verbal consent to proceed.  History of Present Illness   Nathan [name] is a 53 y.o. male here for follow up for sarcoidosis with pulmonary, articular, and cutaneous disease involvement currently on methotrexate  20 mg p.o. weekly folic acid  1 mg daily and prednisone  5 mg daily.    He experiences ongoing neck and back pain, which he attributes to a pinched nerve. The pain radiates down his arm, affecting his ability to sit or stand for prolonged periods. He has undergone multiple procedures, most recently had spinal epidural injection about a month ago, which provided temporary relief for two days. This was after recent updated MRI showing what sounds like multilevel degenerative disease. He continues to take methotrexate  every Wednesday night and prednisone  daily. No leg swelling or glandular swelling. Pain is present in the neck and shoulders, with radiation down the arm.  He has a history of sarcoidosis, currently managed with methotrexate  and prednisone . The treatment has been ongoing, and he has experienced some improvement in joint symptoms with low-dose prednisone . He recalls a previous treatment with high-dose prednisone , which led to weight gain.  He reports a skin rash that has been intermittently dry, for which he has been using a prescribed cream. He has not noticed any other rashes besides the one mentioned.  He is concerned about his ability to work due to his symptoms and has filed for disability. He mentions having undergone a functional capacity exam to assess his physical limitations. He recalls an episode from December  where he visited the emergency room due to severe pain from a pinched nerve, during which he was prescribed a medrol  dose pack and muscle relaer. He has not been sick recently.   12/06/2022 Nathan [name] is a 53 y.o. male here for follow up for sarcoidosis with pulmonary, articular, and cutaneous disease involvement currently on methotrexate  20 mg p.o. weekly folic acid  1 mg daily and prednisone  5 mg daily.  He has had persistent pain in his neck and some shooting radiating type pain more down the right arm than down the left arm.  He is experiencing stiffness and numbness in the hands more after prolonged use especially holding a tight grip.  A little bit of pain around the MCP joints of the hand otherwise more just trouble gripping than actual pain.  He had an MRI of the neck yesterday that was not yet read.  Has a follow-up planned with pain management for this.  He had recent labs checked that showed normal renal function.  Saw dermatology and was started with topical medication appears to be triamcinolone 0.1% for use on the ongoing facial rashes.  So far these have faded in color a little bit but not gone away.  He had follow-up with Dr. Shelah no concern of worsening pulmonary status.  He did have to take antibiotics for dental abscess suspected at the right upper molars.   08/29/2022 Nathan [name] is a 53 y.o. male here for follow up  for sarcoidosis with pulmonary, articular, and cutaneous disease involvement currently on methotrexate  20 mg p.o. weekly folic acid  1 mg daily and prednisone  5 mg daily.  Skin rashes are doing well since her last visit.  [facility] for ongoing neck and upper back pain with trial of steroid injection to the neck with partial benefit.  He is also been recommended if effectiveness is low for possible trigger point injection at multiple sites in the neck and upper back.  He had increase in cholesterol medication with his PCP onto a  atorvastatin  and ezetimibe noticing some increased muscle pain and cramping so discontinued the ezetimibe for about 1 week.  At that visit on July 8 had an elevated CK level of 662.  Outside of these areas joint pain is doing well and not noticed any significant swelling in his arms or legs.  Still has some persistent cough swallowing difficulty and shortness of breath with exertion plans for upcoming repeat CT scan and pulmonology follow-up.  He is also been referred for seeing gastroenterology based on previous findings of laryngeal pharyngeal reflux disease.   05/30/22 Nathan [name] is a 53 y.o. male here for follow up for sarcoidosis with pulmonary articular and cutaneous disease involvement currently on methotrexate  20 mg p.o. weekly folic acid  1 mg daily and prednisone  5 mg daily.  Rash is somewhat improved more since her last visit.  Follow-up with pulmonology clinic not clear whether there is active disease plan for follow-up this summer with repeat lung study to assess this.  He saw the ENT doctor noticed excessive mucus on exam suspicious for reflux for his upper airway cough syndrome issue.  He saw spine specialist for his neck and upper back pain initially started low-dose Lyrica  and tramadol  while getting outside records and workup and has follow-up scheduled later this week.   02/28/22 Nathan [name] is a 53 y.o. male here for follow up for sarcoidosis with pulmonary and cutaneous involvement on methotrexate  15 mg p.o. weekly and folic acid  1 mg daily.  Since her last visit he had PET scan that looked good no evidence of cardiac sarcoidosis activity.  CT scan in October with stable lymphadenopathy and bandlike and nodular opacities in the lungs, although the associated nondiagnostic CT in December was consistent with hypermetabolic mediastinal and hilar lymphadenopathy and bilateral pulmonary parenchymal opacities consistent with inflammatory process and active granulomatous disease.  He  is tolerating the methotrexate  without any difficulty.  Still been noticing some ongoing skin rash with minimally raised hyperpigmented spots along the medial border of the eyes.  He has not felt a significant improvement with his neck pain on the medication.  Still feels restriction in his range of motion especially looking towards the right and gets pain painful experiences towards the left side.   12/19/21 Nathan [name] is a 53 y.o. male here for follow up for joint pains with history of pulmonary and cutaneous sacoidosis now on methotrexate  15 mg PO weekly and folic acid  1 mg daily. He has not noticed any problems with the medication but also no improvement in symptoms so far. Still pain worst in the neck and upper back with radiation into back and with headaches. Asthma symptoms doing somewhat worse this year. He had cardiac MRI but is now recommended for cardiac PET scan to evaluate but not scheduled yet.   Previous HPI 11/15/2021 Nathan [name] is a 53 y.o. male here for joint pains with history  of pulmonary and cutaneous sacoidosis.  His worst affected area is pretty constant pain in the base of the neck area frequently with radiation up the neck and posterior headaches associated with this.  He has had problems going on for years but seems to be progressively getting somewhat worse.  He does get some pain in extremities elsewhere but by far the most limiting for him is in the base of the neck.  He has tried multiple NSAIDs including meloxicam  and Celebrex is also tried muscle relaxant medications none of which were very significantly beneficial.  He is tried physical therapy completing courses and had local injections that did not greatly relieve symptoms.  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  He had most recent neck MRI in 2022 based on findings had discussed surgery options but is looking to avoid this due to concern about complications or long-term needing additional  revision. Original diagnosis of sarcoidosis was based on pulmonary imaging finding and treated with very prolonged prednisone  taper.  Initially only had respiratory symptoms with some mild dyspnea and coughing that improved with the steroid treatments.  More recently this year he had an episode of similar symptoms coming back and was treated with a round of steroids from pulmonology clinic in noticed a increase in blood glucose up to 400s.  He is also developed some new skin rashes on the face previously had involvement on extremities but no facial rashes with original symptoms.  He is prescribed topical medication by Dr. Livingston but has not seen any difference so far.   Review of Systems  Constitutional:  Positive for fatigue.  HENT:  Positive for mouth dryness. Negative for mouth sores.   Eyes:  Positive for dryness.  Respiratory:  Positive for shortness of breath.   Cardiovascular:  Positive for chest pain and palpitations.  Gastrointestinal:  Positive for constipation. Negative for blood in stool and diarrhea.  Endocrine: Negative for increased urination.  Genitourinary:  Negative for involuntary urination.  Musculoskeletal:  Positive for joint pain, gait problem, joint pain, joint swelling, myalgias, muscle weakness, morning stiffness, muscle tenderness and myalgias.  Skin:  Positive for color change, rash and sensitivity to sunlight. Negative for hair loss.  Allergic/Immunologic: Negative for susceptible to infections.  Neurological:  Positive for dizziness and headaches.  Hematological:  Negative for swollen glands.  Psychiatric/Behavioral:  Positive for depressed mood and sleep disturbance. The patient is nervous/anxious.     PMFS History:  Patient Active Problem List   Diagnosis Date Noted   Cervical spine pain 08/29/2022   Myalgia 08/29/2022   High risk medication use 11/15/2021   HLD (hyperlipidemia) 09/13/2021   Asthma 11/10/2020   COVID-19 virus infection 10/21/2019    Shoulder pain, left 10/23/2016   Other chest pain 08/22/2014   Upper airway cough syndrome 07/23/2014   Sarcoidosis 03/06/2011    Past Medical History:  Diagnosis Date   Acid reflux disease    Arthritis    Asthma    'I think I might have asthma   Chronic back pain    Dyspnea    chronic   Hyperlipidemia    Hypertension    Sarcoidosis     Family History  Problem Relation Age of Onset   Hypertension Mother    Diabetes Mother    Heart attack Father    Diabetes Father    Hypertension Father    Gout Father    Colon polyps Sister    Heart disease Paternal Uncle  Prostate cancer Paternal Uncle    Heart disease Maternal Grandfather    Anemia Daughter    Anemia Daughter    Colon cancer Neg Hx    Esophageal cancer Neg Hx    Rectal cancer Neg Hx    Stomach cancer Neg Hx    Past Surgical History:  Procedure Laterality Date   NO PAST SURGERIES     Social History   Social History Narrative   Not on file   Immunization History  Administered Date(s) Administered   Dtap, Unspecified 11/09/1972, 01/07/1973, 03/01/1973, 03/24/1974, 12/06/1977   Influenza,inj,Quad PF,6+ Mos 11/10/2020, 02/16/2022   Measles 09/23/1973   Mumps 03/24/1974   PFIZER Comirnaty(Gray Top)Covid-19 Tri-Sucrose Vaccine 10/28/2020   PFIZER(Purple Top)SARS-COV-2 Vaccination 10/27/2020   PNEUMOCOCCAL CONJUGATE-20 02/16/2022   Polio, Unspecified 11/09/1972, 01/07/1973, 03/11/1973, 03/24/1974, 12/06/1977   Rubella 09/23/1973   Tdap 02/16/2022     Objective: Vital Signs: BP 129/82 (BP Location: Left Arm, Patient Position: Sitting, Cuff Size: Large)   Pulse 80   Resp 14   Ht 5' 8 (1.727 m)   Wt 222 lb (100.7 kg)   BMI 33.75 kg/m    Physical Exam Eyes:     Conjunctiva/sclera: Conjunctivae normal.  Cardiovascular:     Rate and Rhythm: Normal rate and regular rhythm.  Pulmonary:     Effort: Pulmonary effort is normal.     Breath sounds: Normal breath sounds.  Musculoskeletal:     Right lower  leg: No edema.     Left lower leg: No edema.  Lymphadenopathy:     Cervical: No cervical adenopathy.  Skin:    General: Skin is warm and dry.     Findings: Rash present.     Comments: Faint hyperpigmentation in patches on medial border of both eyes, on right side of nose, no erythema no swelling no skin peeling  Neurological:     Mental Status: He is alert.  Psychiatric:        Mood and Affect: Mood normal.      Musculoskeletal Exam:  Neck tenderness to pressure midline and paraspinal muscles, extending over traps out to lateral shoulders Shoulders full ROM Elbows full ROM no tenderness or swelling Wrists full ROM no tenderness or swelling Fingers full ROM no tenderness or swelling Low back muscle tenderness to pressure, no radiation Knees full ROM no tenderness or swelling   Investigation: No additional findings.  Imaging: No results found.  Recent Labs: Lab Results  Component Value Date   WBC 6.2 02/04/2023   HGB 12.8 (L) 02/04/2023   PLT 276 02/04/2023   NA 141 02/04/2023   K 3.3 (L) 02/04/2023   CL 104 02/04/2023   CO2 28 02/04/2023   GLUCOSE 135 (H) 02/04/2023   BUN 13 02/04/2023   CREATININE 0.76 02/04/2023   BILITOT 0.7 01/15/2023   ALKPHOS 95 01/15/2023   AST 40 01/15/2023   ALT 36 01/15/2023   PROT 7.3 01/15/2023   ALBUMIN 4.6 01/15/2023   CALCIUM  9.0 02/04/2023   GFRAA >60 09/17/2019    Speciality Comments: No specialty comments available.  Procedures:  No procedures performed Allergies: Atorvastatin , Rosuvastatin , Tizanidine , Zetia [ezetimibe], Hydrocodone -acetaminophen , Oxycodone -acetaminophen , Percocet [oxycodone -acetaminophen ], and Vicodin [hydrocodone -acetaminophen ]   Assessment / Plan:     Visit Diagnoses: Sarcoidosis - Plan: Sedimentation rate, C-reactive protein, methotrexate  (RHEUMATREX) 2.5 MG tablet, predniSONE  (DELTASONE ) 5 MG tablet Stable on current treatment with low-dose prednisone  and methotrexate . Discussed the limitations of  further improvement with current treatment and the risks of increasing prednisone  dosage. -Continue  methotrexate  20 mg PO weekly and prednisone  5 mg daily -Continue folic acid  1 mg daily -Checking sedimentation rate and CRP for disease activity monitoring  High risk medication use - methotrexate  20 mg p.o. weekly folic acid  1 mg daily - Plan: CBC with Differential/Platelet, COMPLETE METABOLIC PANEL WITH GFR No serious interval infections.  Tolerating medication okay.  He remains on prophylactic Bactrim  prescribed through pulmonology office. Checking CBC and CMP for medication monitoring on continued long-term use of methotrexate  and steroids  Long term (current) use of systemic steroids - prednisone  5 mg daily.  Cervical spine pain Persistent pain despite recent epidural injection. Discussed the transient relief likely due to the short-acting anesthetic component of the injection. Awaiting further recommendations from pain specialist. -Continue current pain management regimen. -Follow-up with pain specialist for further management options.  Skin Rash Ongoing management by dermatology.  So far topical medications with limited benefit.  Overall looks less inflamed compared to at last visit. -Follow-up with dermatology for further management.  Disability Evaluation Discussed limitations in function due to cervical spine disease and sarcoidosis.  I believe his sarcoidosis is controlled to a level of low disease activity already and would not anticipate much further reduction in joint symptoms from our treatments here.  It is possible he was still make further progress if pursuing additional interventions whether therapy injections or surgeries through his spine specialist.  Recommended functional capacity exam or reviewing records of previous exam for more detailed evaluation if needed.   Orders: Orders Placed This Encounter  Procedures   Sedimentation rate   CBC with Differential/Platelet    COMPLETE METABOLIC PANEL WITH GFR   C-reactive protein   Meds ordered this encounter  Medications   methotrexate  (RHEUMATREX) 2.5 MG tablet    Sig: Take 8 tablets (20 mg total) by mouth once a week. Caution:Chemotherapy. Protect from light.    Dispense:  96 tablet    Refill:  0   predniSONE  (DELTASONE ) 5 MG tablet    Sig: Take 1 tablet (5 mg total) by mouth daily with breakfast.    Dispense:  90 tablet    Refill:  0     Follow-Up Instructions: Return in about 3 months (around 06/18/2023) for Sarcoidosis on MTX f/u 3mos.   Lonni LELON Ester, MD  Note - This record has been created using [employer].  Chart creation errors have been sought, but may not always  have been located. Such creation errors do not reflect on  the standard of medical care.

## 2023-03-21 ENCOUNTER — Encounter: Payer: Self-pay | Admitting: Internal Medicine

## 2023-03-21 ENCOUNTER — Ambulatory Visit: Payer: Medicaid Other | Attending: Internal Medicine | Admitting: Internal Medicine

## 2023-03-21 VITALS — BP 129/82 | HR 80 | Resp 14 | Ht 68.0 in | Wt 222.0 lb

## 2023-03-21 DIAGNOSIS — D869 Sarcoidosis, unspecified: Secondary | ICD-10-CM

## 2023-03-21 DIAGNOSIS — Z79899 Other long term (current) drug therapy: Secondary | ICD-10-CM | POA: Diagnosis not present

## 2023-03-21 DIAGNOSIS — Z7952 Long term (current) use of systemic steroids: Secondary | ICD-10-CM

## 2023-03-21 DIAGNOSIS — M542 Cervicalgia: Secondary | ICD-10-CM

## 2023-03-21 MED ORDER — METHOTREXATE SODIUM 2.5 MG PO TABS: 20.0000 mg | ORAL_TABLET | ORAL | 0 refills | Status: DC

## 2023-03-21 MED ORDER — PREDNISONE 5 MG PO TABS: 5.0000 mg | ORAL_TABLET | Freq: Every day | ORAL | 0 refills | Status: DC

## 2023-03-22 LAB — CBC WITH DIFFERENTIAL/PLATELET
Absolute Lymphocytes: 636 {cells}/uL — ABNORMAL LOW (ref 850–3900)
Absolute Monocytes: 398 {cells}/uL (ref 200–950)
Basophils Absolute: 12 {cells}/uL (ref 0–200)
Basophils Relative: 0.3 %
Eosinophils Absolute: 148 {cells}/uL (ref 15–500)
Eosinophils Relative: 3.8 %
HCT: 39.6 % (ref 38.5–50.0)
Hemoglobin: 13.1 g/dL — ABNORMAL LOW (ref 13.2–17.1)
MCH: 28.9 pg (ref 27.0–33.0)
MCHC: 33.1 g/dL (ref 32.0–36.0)
MCV: 87.4 fL (ref 80.0–100.0)
MPV: 10.2 fL (ref 7.5–12.5)
Monocytes Relative: 10.2 %
Neutro Abs: 2707 {cells}/uL (ref 1500–7800)
Neutrophils Relative %: 69.4 %
Platelets: 303 10*3/uL (ref 140–400)
RBC: 4.53 10*6/uL (ref 4.20–5.80)
RDW: 13.3 % (ref 11.0–15.0)
Total Lymphocyte: 16.3 %
WBC: 3.9 10*3/uL (ref 3.8–10.8)

## 2023-03-22 LAB — COMPLETE METABOLIC PANEL WITH GFR
AG Ratio: 2 (calc) (ref 1.0–2.5)
ALT: 30 U/L (ref 9–46)
AST: 31 U/L (ref 10–35)
Albumin: 4.4 g/dL (ref 3.6–5.1)
Alkaline phosphatase (APISO): 60 U/L (ref 35–144)
BUN/Creatinine Ratio: 13 (calc) (ref 6–22)
BUN: 8 mg/dL (ref 7–25)
CO2: 26 mmol/L (ref 20–32)
Calcium: 8.9 mg/dL (ref 8.6–10.3)
Chloride: 103 mmol/L (ref 98–110)
Creat: 0.6 mg/dL — ABNORMAL LOW (ref 0.70–1.30)
Globulin: 2.2 g/dL (ref 1.9–3.7)
Glucose, Bld: 108 mg/dL — ABNORMAL HIGH (ref 65–99)
Potassium: 3.7 mmol/L (ref 3.5–5.3)
Sodium: 141 mmol/L (ref 135–146)
Total Bilirubin: 1 mg/dL (ref 0.2–1.2)
Total Protein: 6.6 g/dL (ref 6.1–8.1)
eGFR: 118 mL/min/{1.73_m2} (ref >=60)

## 2023-03-22 LAB — SEDIMENTATION RATE: Sed Rate: 6 mm/h (ref 0–15)

## 2023-03-22 LAB — C-REACTIVE PROTEIN: CRP: 3.2 mg/L (ref <8.0)

## 2023-03-23 ENCOUNTER — Telehealth: Payer: Self-pay | Admitting: *Deleted

## 2023-03-23 NOTE — Telephone Encounter (Signed)
 Patient contacted the office and would like to have his lab results. Please review and advise.

## 2023-03-26 ENCOUNTER — Telehealth: Payer: Self-pay | Admitting: Internal Medicine

## 2023-03-26 NOTE — Telephone Encounter (Signed)
 Now addressed in associated result note

## 2023-03-26 NOTE — Telephone Encounter (Signed)
 See lab note for details.

## 2023-03-26 NOTE — Progress Notes (Signed)
 Sedimentation rate and CRP are normal.  Blood count shows very mild low hemoglobin 13.1 and lymphocytes 636.  These are not low enough to be significant.  Kidney and liver function test are normal.  Do not recommend any new changes to his medication.

## 2023-03-26 NOTE — Telephone Encounter (Signed)
 Pt called requesting his labs to be read to him. Pt states he called Friday as well.

## 2023-04-12 ENCOUNTER — Ambulatory Visit: Payer: Medicaid Other | Admitting: Internal Medicine

## 2023-04-19 NOTE — Progress Notes (Signed)
 Cardiology Office Note:   Date:  04/25/2023  ID:  Nathan [name], DOB August 17, 1970, MRN [id number] PCP:  Aviva Kluver  [facility] Providers Cardiologist:  Alverda Skeans, MD Referring MD: No ref. provider found  Chief Complaint/Reason for Referral: Follow-up for dyspnea, hyperlipidemia ASSESSMENT:    1. Dyspnea on exertion   2. Elevated lipoprotein(a)   3. Aortic atherosclerosis (HCC)   4. Type 2 diabetes mellitus without complication, without long-term current use of insulin (HCC)   5. Hyperlipidemia associated with type 2 diabetes mellitus (HCC)   6. Hypertension associated with diabetes (HCC)   7. BMI 34.0-34.9,adult   8. Nausea     PLAN:   In order of problems listed above: Dyspnea: Has derived some benefit from Lasix.  Echocardiogram did not demonstrate diastolic dysfunction.  Will continue Lasix for now.  My feeling is this is likely a pulmonary etiology given asthma, pulmonary sarcoidosis, and emphysema history.  He follows with pulmonary and rheumatology. Elevated LP(a): Continue Repatha 140 mg every 2 weeks Aortic atherosclerosis: Continue aspirin 81 mg and Repatha 140 mg every 2 weeks.  Strict blood pressure control. T2DM: Continue aspirin 81 mg, valsartan 40 mg, Repatha 140 mg every 2 weeks, and start Jardiance 10 mg daily. Hyperlipidemia: Intolerant of atorvastatin, Crestor, and Zetia.  Continue Repatha 140 mg every 2 weeks. Hypertension: Blood pressure above goal today; increase valsartan to 80 mg. Elevated BMI: Given history of type 2 diabetes will refer to pharmacy for recommendations regarding GLP-1 receptor agonist therapy. Nausea: I did an AI search of his medications and many of them do cause nausea and dizziness as the patient suspected.            Dispo:  Return in about 9 months (around 01/25/2024).      Medication Adjustments/Labs and Tests Ordered: Current medicines are reviewed at length with the patient today.  Concerns regarding medicines are  outlined above.  The following changes have been made:     Labs/tests ordered: Orders Placed This Encounter  Procedures   AMB Referral to [facility] Pharm-D    Medication Changes: Meds ordered this encounter  Medications   valsartan (DIOVAN) 80 MG tablet    Sig: Take 1 tablet (80 mg total) by mouth daily.    Dispense:  90 tablet    Refill:  3    Dose increase   empagliflozin (JARDIANCE) 10 MG TABS tablet    Sig: Take 1 tablet (10 mg total) by mouth daily before breakfast.    Dispense:  30 tablet    Refill:  5   empagliflozin (JARDIANCE) 10 MG TABS tablet    Sig: Take 1 tablet (10 mg total) by mouth daily before breakfast.    Dispense:  28 tablet    Refill:  0    Current medicines are reviewed at length with the patient today.  The patient does not have concerns regarding medicines.  I spent 38 minutes reviewing all clinical data during and prior to this visit including all relevant imaging studies, laboratories, clinical information from other health systems and prior notes from both Cardiology and other specialties, interviewing the patient, conducting a complete physical examination, and coordinating care in order to formulate a comprehensive and personalized evaluation and treatment plan.   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  He is referred for coronary CTA and echocardiogram.  He was started on aspirin 81 mg and his Crestor was continued due to the presence of aortic atherosclerosis on a previous CT scan.    July  2023: In the interim the patient had an echocardiogram which was reassuring.  His coronary CTA was not performed.  He presented in February and in March with myalgias of his lower extremities.  The patient still gets occasional fleeting chest pains.  He is not sure if this relates to his back pain that he has been dealing with.  He denies any shortness of breath, palpitations, presyncope, or syncope.  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  He is required no emergency room visits or hospitalizations.  Because of his intolerance of atorvastatin he was started on Zetia about a month and a half ago.  Plan:  Refer for CMR for sarcoid; coronary CTA for chest pain; check lipid panel, Lp(a).   January 2024:  The patient had a CMR and PET scan demonstrating no active cardiac sarcoid.  A coronary CTA was also reassuring.  He is seeing pulmonary rheumatology regarding his pulmonary sarcoidosis.  He still gets short of breath at times and his inhalers are being managed actively.  Sometimes he develops orthopnea but this is not a routine issue.  He denies any significant swelling in his legs.  He gets short of breath at times.  He fortunately has not required any emergency room visits or hospitalizations.  He has had no chest pain.  He denies any presyncope or syncope.  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  He is applying for disability.  He is hopeful he can get back to work however once his breathing feels better.  Plan: Increase atorvastatin to 20 mg and check lipid panel and LFTs in 2 months; trial Lasix for ongoing dyspnea.  March 2025:  Patient consents to use of AI scribe. In the interim the patient did derive benefit from Lasix and this was continued.  He was seen in September and due to an elevated blood pressure valsartan 40 mg was started.  He had issues with atorvastatin, Zetia, and Crestor.  He was ultimately started on  Repatha by pharmacy.  His LDL in December was 55.  He experiences dizziness and nausea, particularly when waking up at night to use the bathroom. He describes episodes where he stands up and feels like he might fall, sometimes needing to sit back down immediately. No wheezing or heart racing.  He takes approximately 20 different medications daily, which he believes may contribute to his symptoms of dizziness and nausea.  An AI search of his medications did demonstrate many of his medications were associate with nausea and dizziness.  He has a history of sarcoidosis, which is currently stable. He is under the care of a rheumatologist and a pulmonologist. He takes methotrexate (eight pills every Wednesday) and prednisone (5 or 10 mg daily) for sarcoidosis. Recent tests confirmed that the sarcoidosis is not affecting his heart or lungs.  He is diabetic, managed with metformin (500 mg twice daily) and Januvia (50 mg daily). He does not use insulin.  He recently experienced changes in  his pain management regimen. His pain specialist initially prescribed pain patches, which were ineffective, and then switched to hydrocodone 7.5/3.25 mg, which he is currently taking.         Current Medications: Current Meds  Medication Sig   albuterol (VENTOLIN HFA) 108 (90 Base) MCG/ACT inhaler Inhale 2 puffs into the lungs every 6 (six) hours as needed for wheezing or shortness of breath.   amitriptyline (ELAVIL) 10 MG tablet Take 10 mg by mouth at bedtime.   aspirin EC 81 MG tablet Take 1 tablet (81 mg total) by mouth daily. Swallow whole.   BREZTRI AEROSPHERE 160-9-4.8 MCG/ACT AERO INHALE TWO PUFFS BY MOUTH TWICE A DAY (IN THE MORNING AND AT BEDTIME)   BUTRANS 20 MCG/HR PTWK 1 patch once a week.   celecoxib (CELEBREX) 100 MG capsule Take 100 mg by mouth 2 (two) times daily.   cetirizine (ZYRTEC) 10 MG tablet Take 1 tablet by mouth daily.   cyclobenzaprine (FLEXERIL) 10 MG tablet    Docusate Sodium (DSS) 100 MG  CAPS Take by mouth.   empagliflozin (JARDIANCE) 10 MG TABS tablet Take 1 tablet (10 mg total) by mouth daily before breakfast.   empagliflozin (JARDIANCE) 10 MG TABS tablet Take 1 tablet (10 mg total) by mouth daily before breakfast.   Evolocumab (REPATHA SURECLICK) 140 MG/ML SOAJ Inject 140 mg into the skin every 14 (fourteen) days.   fluticasone (FLONASE) 50 MCG/ACT nasal spray Place into the nose.   folic acid (FOLVITE) 1 MG tablet Take 1 tablet (1 mg total) by mouth daily.   HYDROcodone-acetaminophen (NORCO) 7.5-325 MG tablet Take 1 tablet by mouth 3 (three) times daily as needed.   hydrocortisone 2.5 % cream Apply topically.   JANUVIA 50 MG tablet Take 1 tablet by mouth daily.   metFORMIN (GLUCOPHAGE-XR) 500 MG 24 hr tablet Take 500 mg by mouth 2 (two) times daily.   methocarbamol (ROBAXIN) 500 MG tablet Take 1 tablet (500 mg total) by mouth every 8 (eight) hours as needed for muscle spasms.   methotrexate (RHEUMATREX) 2.5 MG tablet Take 8 tablets (20 mg total) by mouth once a week. Caution:Chemotherapy. Protect from light.   omeprazole (PRILOSEC) 20 MG capsule Take by mouth.   ondansetron (ZOFRAN) 4 MG tablet Take 4 mg by mouth every 8 (eight) hours as needed.   pravastatin (PRAVACHOL) 10 MG tablet Take 10 mg by mouth daily.   predniSONE (DELTASONE) 5 MG tablet Take 1 tablet (5 mg total) by mouth daily with breakfast.   pregabalin (LYRICA) 200 MG capsule 1 capsule Oral every 12 hours for 30 days   sulfamethoxazole-trimethoprim (BACTRIM DS) 800-160 MG tablet TAKE ONE TABLET BY MOUTH THREE TIMES A WEEK (MONDAY, WEDNESDAY, FRIDAY)   tacrolimus (PROTOPIC) 0.1 % ointment Apply topically 2 (two) times daily.   triamcinolone ointment (KENALOG) 0.1 % Apply 1 Application topically 2 (two) times daily.   valsartan (DIOVAN) 80 MG tablet Take 1 tablet (80 mg total) by mouth daily.   [DISCONTINUED] valsartan (DIOVAN) 40 MG tablet Take 1 tablet (40 mg total) by mouth daily.     Review of Systems:    Please see the history of present illness.    All other systems reviewed and are negative.     EKGs/Labs/Other Test Reviewed:   EKG: EKG from December 2024 demonstrates sinus rhythm  EKG Interpretation Date/Time:    Ventricular Rate:    PR Interval:    QRS Duration:    QT Interval:    QTC Calculation:  R Axis:      Text Interpretation:           Risk Assessment/Calculations:          Physical Exam:   VS:  BP (!) 120/90   Pulse 97   Ht 5\' 8"  (1.727 m)   Wt 226 lb (102.5 kg)   SpO2 95%   BMI 34.36 kg/m    HYPERTENSION CONTROL Vitals:   04/25/23 1403 04/25/23 1416  BP: (!) 124/96 (!) 120/90    The patient's blood pressure is elevated above target today.  In order to address the patient's elevated BP: A current anti-hypertensive medication was adjusted today.      Wt Readings from Last 3 Encounters:  04/25/23 226 lb (102.5 kg)  03/21/23 222 lb (100.7 kg)  12/06/22 231 lb (104.8 kg)      GENERAL:  No apparent distress, AOx3 HEENT:  No carotid bruits, +2 carotid impulses, no scleral icterus CAR: RRR no murmurs, gallops, rubs, or thrills RES:  Clear to auscultation bilaterally ABD:  Soft, nontender, nondistended, positive bowel sounds x 4 VASC:  +2 radial pulses, +2 carotid pulses NEURO:  CN 2-12 grossly intact; motor and sensory grossly intact PSYCH:  No active depression or anxiety EXT:  No edema, ecchymosis, or cyanosis  Signed, Orbie Pyo, MD  04/25/2023 2:32 PM    [facility] [address] [place], [place], [place]  [address] Phone: [phone]; Fax: [phone]   Note:  This document was prepared using Dragon voice recognition software and may include unintentional dictation errors.

## 2023-04-25 ENCOUNTER — Ambulatory Visit: Payer: Medicaid Other | Attending: Internal Medicine | Admitting: Internal Medicine

## 2023-04-25 ENCOUNTER — Encounter: Payer: Self-pay | Admitting: Internal Medicine

## 2023-04-25 VITALS — BP 120/90 | HR 97 | Ht 68.0 in | Wt 226.0 lb

## 2023-04-25 DIAGNOSIS — E1159 Type 2 diabetes mellitus with other circulatory complications: Secondary | ICD-10-CM

## 2023-04-25 DIAGNOSIS — E119 Type 2 diabetes mellitus without complications: Secondary | ICD-10-CM | POA: Diagnosis not present

## 2023-04-25 DIAGNOSIS — I700 Atherosclerosis of aorta: Secondary | ICD-10-CM

## 2023-04-25 DIAGNOSIS — R0609 Other forms of dyspnea: Secondary | ICD-10-CM

## 2023-04-25 DIAGNOSIS — E1169 Type 2 diabetes mellitus with other specified complication: Secondary | ICD-10-CM

## 2023-04-25 DIAGNOSIS — R110 Nausea: Secondary | ICD-10-CM

## 2023-04-25 DIAGNOSIS — E7841 Elevated Lipoprotein(a): Secondary | ICD-10-CM

## 2023-04-25 DIAGNOSIS — I152 Hypertension secondary to endocrine disorders: Secondary | ICD-10-CM

## 2023-04-25 DIAGNOSIS — Z6834 Body mass index (BMI) 34.0-34.9, adult: Secondary | ICD-10-CM

## 2023-04-25 DIAGNOSIS — E785 Hyperlipidemia, unspecified: Secondary | ICD-10-CM

## 2023-04-25 MED ORDER — EMPAGLIFLOZIN 10 MG PO TABS: 10.0000 mg | ORAL_TABLET | Freq: Every day | ORAL | 0 refills | Status: DC

## 2023-04-25 MED ORDER — EMPAGLIFLOZIN 10 MG PO TABS: 10.0000 mg | ORAL_TABLET | Freq: Every day | ORAL | 5 refills | Status: DC

## 2023-04-25 MED ORDER — FUROSEMIDE 20 MG PO TABS: 10.0000 mg | ORAL_TABLET | Freq: Every day | ORAL | 3 refills | Status: AC

## 2023-04-25 MED ORDER — VALSARTAN 80 MG PO TABS: 80.0000 mg | ORAL_TABLET | Freq: Every day | ORAL | 3 refills | Status: DC

## 2023-04-25 NOTE — Addendum Note (Signed)
 Addended by: Lendon Ka on: 04/25/2023 02:44 PM   Modules accepted: Orders

## 2023-04-25 NOTE — Patient Instructions (Addendum)
 Medication Instructions:  Your physician has recommended you make the following change in your medication:  1.) increase valsartan to 80 mg - one tablet daily 2.) start Jardiance 10 mg - one tablet daily before breakfast  *If you need a refill on your cardiac medications before your next appointment, please call your pharmacy*   Lab Work: none   Testing/Procedures: none   Follow-Up: At [facility], you and your health needs are our priority.  As part of our continuing mission to provide you with exceptional heart care, we have created designated Provider Care Teams.  These Care Teams include your primary Cardiologist (physician) and Advanced Practice Providers (APPs -  Physician Assistants and Nurse Practitioners) who all work together to provide you with the care you need, when you need it.   Your next appointment:   9 month(s)  Provider:   Jari Favre, PA-C, Ronie Spies, PA-C, Robin Searing, NP, Jacolyn Reedy, PA-C, Eligha Bridegroom, NP, Tereso Newcomer, PA-C, or Perlie Gold, PA-C       Other Instructions You have been referred to clinical pharmacy team for elevated BMI

## 2023-04-26 ENCOUNTER — Telehealth: Payer: Self-pay | Admitting: Pharmacy Technician

## 2023-04-26 ENCOUNTER — Other Ambulatory Visit (HOSPITAL_COMMUNITY): Payer: Self-pay

## 2023-04-26 NOTE — Telephone Encounter (Signed)
 Pharmacy Patient Advocate Encounter   Received notification from Fax that prior authorization for jardiance is required/requested.   Insurance verification completed.   The patient is insured through [facility] MEDICAID .   Per test claim: PA required; PA submitted to above mentioned insurance via CoverMyMeds Key/confirmation #/EOC [id number] Status is pending

## 2023-04-27 ENCOUNTER — Other Ambulatory Visit (HOSPITAL_COMMUNITY): Payer: Self-pay

## 2023-04-27 NOTE — Telephone Encounter (Signed)
 Pharmacy Patient Advocate Encounter  Received notification from [facility] MEDICAID that Prior Authorization for jardiance has been APPROVED from 04/26/23 to 04/25/24. Spoke to pharmacy to process.Copay is $4.00.    PA #/Case ID/Reference #: [id number]

## 2023-04-28 ENCOUNTER — Encounter (HOSPITAL_BASED_OUTPATIENT_CLINIC_OR_DEPARTMENT_OTHER): Payer: Self-pay

## 2023-04-28 ENCOUNTER — Emergency Department (HOSPITAL_BASED_OUTPATIENT_CLINIC_OR_DEPARTMENT_OTHER)
Admission: EM | Admit: 2023-04-28 | Discharge: 2023-04-28 | Disposition: A | Discharge status: Home / Self Care | Attending: Emergency Medicine | Admitting: Emergency Medicine

## 2023-04-28 ENCOUNTER — Other Ambulatory Visit: Payer: Self-pay

## 2023-04-28 ENCOUNTER — Emergency Department (HOSPITAL_BASED_OUTPATIENT_CLINIC_OR_DEPARTMENT_OTHER)

## 2023-04-28 DIAGNOSIS — Z79899 Other long term (current) drug therapy: Secondary | ICD-10-CM | POA: Diagnosis not present

## 2023-04-28 DIAGNOSIS — I10 Essential (primary) hypertension: Secondary | ICD-10-CM | POA: Insufficient documentation

## 2023-04-28 DIAGNOSIS — R112 Nausea with vomiting, unspecified: Secondary | ICD-10-CM | POA: Diagnosis present

## 2023-04-28 DIAGNOSIS — R197 Diarrhea, unspecified: Secondary | ICD-10-CM | POA: Insufficient documentation

## 2023-04-28 DIAGNOSIS — Z7982 Long term (current) use of aspirin: Secondary | ICD-10-CM | POA: Diagnosis not present

## 2023-04-28 LAB — CBC
HCT: 39.4 % (ref 39.0–52.0)
Hemoglobin: 13.3 g/dL (ref 13.0–17.0)
MCH: 28.5 pg (ref 26.0–34.0)
MCHC: 33.8 g/dL (ref 30.0–36.0)
MCV: 84.5 fL (ref 80.0–100.0)
Platelets: 264 10*3/uL (ref 150–400)
RBC: 4.66 MIL/uL (ref 4.22–5.81)
RDW: 13.5 % (ref 11.5–15.5)
WBC: 4 10*3/uL (ref 4.0–10.5)
nRBC: 0 % (ref 0.0–0.2)

## 2023-04-28 LAB — TROPONIN I (HIGH SENSITIVITY): Troponin I (High Sensitivity): 4 ng/L (ref <18)

## 2023-04-28 LAB — COMPREHENSIVE METABOLIC PANEL
ALT: 19 U/L (ref 0–44)
AST: 20 U/L (ref 15–41)
Albumin: 4.5 g/dL (ref 3.5–5.0)
Alkaline Phosphatase: 62 U/L (ref 38–126)
Anion gap: 9 (ref 5–15)
BUN: 12 mg/dL (ref 6–20)
CO2: 30 mmol/L (ref 22–32)
Calcium: 8.7 mg/dL — ABNORMAL LOW (ref 8.9–10.3)
Chloride: 100 mmol/L (ref 98–111)
Creatinine, Ser: 0.63 mg/dL (ref 0.61–1.24)
GFR, Estimated: >60 mL/min (ref >60)
Glucose, Bld: 81 mg/dL (ref 70–99)
Potassium: 4 mmol/L (ref 3.5–5.1)
Sodium: 139 mmol/L (ref 135–145)
Total Bilirubin: 0.8 mg/dL (ref 0.0–1.2)
Total Protein: 6.7 g/dL (ref 6.5–8.1)

## 2023-04-28 LAB — RESP PANEL BY RT-PCR (RSV, FLU A&B, COVID)  RVPGX2
Influenza A by PCR: NEGATIVE
Influenza B by PCR: NEGATIVE
Resp Syncytial Virus by PCR: NEGATIVE
SARS Coronavirus 2 by RT PCR: NEGATIVE

## 2023-04-28 LAB — LIPASE, BLOOD: Lipase: 27 U/L (ref 11–51)

## 2023-04-28 MED ORDER — SODIUM CHLORIDE 0.9 % IV BOLUS
1000.0000 mL | Freq: Once | INTRAVENOUS | Status: AC
  Administered 2023-04-28: 1000 mL via INTRAVENOUS

## 2023-04-28 MED ORDER — ONDANSETRON HCL 4 MG/2ML IJ SOLN
4.0000 mg | Freq: Once | INTRAMUSCULAR | Status: AC
  Administered 2023-04-28: 4 mg via INTRAVENOUS
  Filled 2023-04-28: qty 2

## 2023-04-28 MED ORDER — ONDANSETRON HCL 4 MG PO TABS: 4.0000 mg | ORAL_TABLET | Freq: Four times a day (QID) | ORAL | 0 refills | Status: DC

## 2023-04-28 NOTE — ED Provider Notes (Signed)
 [place] EMERGENCY DEPARTMENT AT [facility] Provider Note   CSN: [id number] Arrival date & time: 04/28/23  1157     History  Chief Complaint  Patient presents with   Dizziness   HPI Nathan [name] is a 53 y.o. male with history of hypertension, hyperlipidemia, sarcoidosis, asthma presenting for nausea vomiting diarrhea and dizziness.  Symptoms started about a week ago.  States he feels intermittently "off balance".  Reports that sitting for long periods of time and then standing makes it worse.  The dizziness usually last from seconds to a couple of minutes and goes away.  States that he is not dizzy at this time but somewhat nauseous.Marland Kitchen  He does report some left-sided chest pain that is nonradiating and states has been there for several months.  Denies shortness of breath.  Also states she has had some vomiting and diarrhea in the last week was recently started on Jardiance.  Denies any abdominal pain.  Denies urinary symptoms.  Also reports that he recently increased his hypertension medication and Norco for his back pain. Denies trauma.    Dizziness      Home Medications Prior to Admission medications   Medication Sig Start Date End Date Taking? Authorizing Provider  albuterol (VENTOLIN HFA) 108 (90 Base) MCG/ACT inhaler Inhale 2 puffs into the lungs every 6 (six) hours as needed for wheezing or shortness of breath. 03/08/22   Bevelyn Ngo, NP  amitriptyline (ELAVIL) 10 MG tablet Take 10 mg by mouth at bedtime. 02/22/23   [provider]  aspirin EC 81 MG tablet Take 1 tablet (81 mg total) by mouth daily. Swallow whole. 02/28/21   Orbie Pyo, MD  BREZTRI AEROSPHERE 160-9-4.8 MCG/ACT AERO INHALE TWO PUFFS BY MOUTH TWICE A DAY (IN THE MORNING AND AT BEDTIME) 11/08/22   Byrum, Les Pou, MD  buprenorphine (BUTRANS) 15 MCG/HR 1 patch to skin Transdermal every week for 28 days Patient not taking: Reported on 04/25/2023 07/17/22   [provider]  BUTRANS  20 MCG/HR PTWK 1 patch once a week. 03/05/23   [provider]  celecoxib (CELEBREX) 100 MG capsule Take 100 mg by mouth 2 (two) times daily.    [provider]  cetirizine (ZYRTEC) 10 MG tablet Take 1 tablet by mouth daily. 04/05/23 04/04/24  [provider]  cyclobenzaprine (FLEXERIL) 10 MG tablet  05/18/22   [provider]  Docusate Sodium (DSS) 100 MG CAPS Take by mouth. 03/14/23   [provider]  empagliflozin (JARDIANCE) 10 MG TABS tablet Take 1 tablet (10 mg total) by mouth daily before breakfast. 04/25/23   Orbie Pyo, MD  empagliflozin (JARDIANCE) 10 MG TABS tablet Take 1 tablet (10 mg total) by mouth daily before breakfast. 04/25/23   Orbie Pyo, MD  Evolocumab (REPATHA SURECLICK) 140 MG/ML SOAJ Inject 140 mg into the skin every 14 (fourteen) days. 11/13/22   Swinyer, Zachary George, NP  fluticasone Aleda Grana) 50 MCG/ACT nasal spray Place into the nose. 04/05/23   [provider]  folic acid (FOLVITE) 1 MG tablet Take 1 tablet (1 mg total) by mouth daily. 02/28/22   Rice, Jamesetta Orleans, MD  furosemide (LASIX) 20 MG tablet Take 0.5 tablets (10 mg total) by mouth daily. 04/25/23   Orbie Pyo, MD  HYDROcodone-acetaminophen (NORCO) 7.5-325 MG tablet Take 1 tablet by mouth 3 (three) times daily as needed. 04/19/23   [provider]  hydrocortisone 2.5 % cream Apply topically. 11/10/22   [provider]  JANUVIA 50 MG tablet Take 1 tablet by mouth daily. 04/25/22 04/25/23  [provider]  loratadine (CLARITIN) 10 MG tablet Take 1 tablet by mouth daily. 04/20/22 11/09/22  [provider]  metFORMIN (GLUCOPHAGE-XR) 500 MG 24 hr tablet Take 500 mg by mouth 2 (two) times daily. 08/24/21   [provider]  methocarbamol (ROBAXIN) 500 MG tablet Take 1 tablet (500 mg total) by mouth every 8 (eight) hours as needed for muscle spasms. 02/04/23   Rancour, Jeannett Senior, MD  methotrexate (RHEUMATREX) 2.5 MG tablet Take  8 tablets (20 mg total) by mouth once a week. Caution:Chemotherapy. Protect from light. 03/21/23   Fuller Plan, MD  omeprazole (PRILOSEC) 20 MG capsule Take by mouth. 05/29/22 04/25/23  [provider]  ondansetron (ZOFRAN) 4 MG tablet Take 4 mg by mouth every 8 (eight) hours as needed. 07/27/22   [provider]  pravastatin (PRAVACHOL) 10 MG tablet Take 10 mg by mouth daily. 09/14/22   [provider]  predniSONE (DELTASONE) 5 MG tablet Take 1 tablet (5 mg total) by mouth daily with breakfast. 03/21/23   Fuller Plan, MD  pregabalin (LYRICA) 200 MG capsule 1 capsule Oral every 12 hours for 30 days 07/17/22   [provider]  sulfamethoxazole-trimethoprim (BACTRIM DS) 800-160 MG tablet TAKE ONE TABLET BY MOUTH THREE TIMES A WEEK (MONDAY, WEDNESDAY, FRIDAY) 02/12/23   Nyoka Cowden, MD  tacrolimus (PROTOPIC) 0.1 % ointment Apply topically 2 (two) times daily. 09/07/21   Janalyn Harder, MD  triamcinolone ointment (KENALOG) 0.1 % Apply 1 Application topically 2 (two) times daily.    [provider]  valsartan (DIOVAN) 80 MG tablet Take 1 tablet (80 mg total) by mouth daily. 04/25/23   Orbie Pyo, MD      Allergies    Atorvastatin, Rosuvastatin, Tizanidine, Zetia [ezetimibe], Hydrocodone-acetaminophen, Oxycodone-acetaminophen, Percocet [oxycodone-acetaminophen], and Vicodin [hydrocodone-acetaminophen]    Review of Systems   Review of Systems  Neurological:  Positive for dizziness.    Physical Exam Updated Vital Signs BP 107/79 (BP Location: Right Arm)   Pulse 82   Temp 98.3 F (36.8 C)   Resp 12   Ht 5\' 8"  (1.727 m)   Wt 102.5 kg   SpO2 94%   BMI 34.36 kg/m  Physical Exam Vitals and nursing note reviewed.  HENT:     Head: Normocephalic and atraumatic.     Mouth/Throat:     Mouth: Mucous membranes are moist.  Eyes:     General:        Right eye: No discharge.        Left eye: No discharge.     Conjunctiva/sclera:  Conjunctivae normal.  Cardiovascular:     Rate and Rhythm: Normal rate and regular rhythm.     Pulses: Normal pulses.     Heart sounds: Normal heart sounds.  Pulmonary:     Effort: Pulmonary effort is normal.     Breath sounds: Normal breath sounds.  Abdominal:     General: Abdomen is flat.     Palpations: Abdomen is soft.  Skin:    General: Skin is warm and dry.  Neurological:     General: No focal deficit present.     Comments: GCS 15. Speech is goal oriented. No deficits appreciated to CN III-XII; symmetric eyebrow raise, no facial drooping, tongue midline. Patient has equal grip strength bilaterally with 5/5 strength against resistance in all major muscle groups bilaterally. Sensation to light touch intact. Patient moves  extremities without ataxia. Normal finger-nose-finger. Patient ambulatory with steady gait.  Psychiatric:        Mood and Affect: Mood normal.     ED Results / Procedures / Treatments   Labs (all labs ordered are listed, but only abnormal results are displayed) Labs Reviewed  COMPREHENSIVE METABOLIC PANEL - Abnormal; Notable for the following components:      Result Value   Calcium 8.7 (*)    All other components within normal limits  RESP PANEL BY RT-PCR (RSV, FLU A&B, COVID)  RVPGX2  CBC  LIPASE, BLOOD  TROPONIN I (HIGH SENSITIVITY)    EKG EKG Interpretation Date/Time:  Saturday April 28 2023 13:25:24 EDT Ventricular Rate:  89 PR Interval:  190 QRS Duration:  103 QT Interval:  362 QTC Calculation: 441 R Axis:   -34  Text Interpretation: Sinus rhythm Left axis deviation Confirmed by Alvester Chou [phone]) on 04/28/2023 2:47:17 PM  Radiology DG Chest Port 1 View Result Date: 04/28/2023 CLINICAL DATA:  Nausea for 1 week. Off balance. Recent increase in high blood pressure medication. EXAM: PORTABLE CHEST 1 VIEW COMPARISON:  08/30/2020. FINDINGS: Cardiac silhouette normal in size.  No mediastinal or hilar masses. Low lung volumes.  Elevated right  hemidiaphragm, stable. Mild linear opacities at the lung bases, more evident on the right, consistent with atelectasis. Linear scarring noted in the left upper lobe. No evidence of pneumonia or pulmonary edema. No pleural effusion or pneumothorax. Skeletal structures are grossly intact. IMPRESSION: No active disease. Electronically Signed   By: Amie Portland M.D.   On: 04/28/2023 13:42    Procedures Procedures    Medications Ordered in ED Medications  sodium chloride 0.9 % bolus 1,000 mL (0 mLs Intravenous Stopped 04/28/23 1436)  ondansetron (ZOFRAN) injection 4 mg (4 mg Intravenous Given 04/28/23 1330)    ED Course/ Medical Decision Making/ A&P                                 Medical Decision Making Amount and/or Complexity of Data Reviewed Labs: ordered. Radiology: ordered.  Risk Prescription drug management.   Initial Impression and Ddx 53 year old well-appearing male presenting for nausea vomiting diarrhea and dizziness.  Exam is unremarkable.  DDx includes stroke, dehydration, electrolyte derangement, ACS, arrhythmia, intra-abdominal infection, other. Patient PMH that increases complexity of ED encounter:  history of hypertension, hyperlipidemia, sarcoidosis, asthma  Interpretation of Diagnostics - I independent reviewed and interpreted the labs as followed: No acute derangements  - I independently visualized the following imaging with scope of interpretation limited to determining acute life threatening conditions related to emergency care: cxr, which revealed no acute findings  -I personally reviewed and interpreted EKG which revealed sinus rhythm  Patient Reassessment and Ultimate Disposition/Management On reassessment, patient remained well and asymptomatic.  Workup reassuring.  Suspect this is a viral process and N/V/D likely contributing to his dizziness.  Considered stroke but unlikely given no focal neurodeficits.  Workup does not suggest arrhythmia or ACS.  Fluid  challenge with no complication.  Sent Zofran to his pharmacy.  Advised to follow-up with PCP.  Discharged.  Also considered intra-abdominal infection but unlikely given benign abdominal exam, afebrile and reassuring labs.  Patient management required discussion with the following services or consulting groups:  None  Complexity of Problems Addressed Acute complicated illness or Injury  Additional Data Reviewed and Analyzed Further history obtained from: Past medical history and medications listed in the EMR and  Prior ED visit notes  Patient Encounter Risk Assessment Prescriptions         Final Clinical Impression(s) / ED Diagnoses Final diagnoses:  Nausea vomiting and diarrhea    Rx / DC Orders ED Discharge Orders     None         Gareth Eagle, PA-C 04/28/23 1533    Terald Sleeper, MD 04/29/23 [phone]

## 2023-04-28 NOTE — ED Triage Notes (Signed)
 In for eval of "off balance" and nauseated for over 1 week. Resent increased HTN medication and added jaurdiance.

## 2023-04-28 NOTE — Discharge Instructions (Addendum)
 Evaluation was overall reassuring.  Please follow-up PCP.  If you have inability tolerate fluid intake or persistent dizziness, chest pain or any other concerning symptom please return emerged part for evaluation.  Otherwise recommend assertive hydration at home.  Suspect your dizziness and lightheadedness is likely due to a GI viral illness.  Treatment at home is conservative which includes assertive hydration and advancing her diet as tolerated.

## 2023-04-28 NOTE — ED Notes (Signed)
 Patient given gatorade for fluid challenge

## 2023-05-09 ENCOUNTER — Other Ambulatory Visit: Payer: Self-pay | Admitting: Emergency Medicine

## 2023-05-09 MED ORDER — BREZTRI AEROSPHERE 160-9-4.8 MCG/ACT IN AERO: 2.0000 | INHALATION_SPRAY | Freq: Two times a day (BID) | RESPIRATORY_TRACT | 3 refills | Status: DC

## 2023-05-09 NOTE — Telephone Encounter (Signed)
 Copied from CRM [id number]. Topic: Clinical - Medication Refill >> May 09, 2023  9:58 AM Isabell A wrote: Most Recent Primary Care Visit:   Medication: BREZTRI AEROSPHERE 160-9-4.8 MCG/ACT AERO  Has the patient contacted their pharmacy? Yes (Agent: If no, request that the patient contact the pharmacy for the refill. If patient does not wish to contact the pharmacy document the reason why and proceed with request.) (Agent: If yes, when and what did the pharmacy advise?)  Is this the correct pharmacy for this prescription? Yes If no, delete pharmacy and type the correct one.  This is the patient's preferred pharmacy:  [facility] [address] [place], [place] - [phone] [place]. AT [address] [place]. [place] [address] Phone: [phone] Fax: [phone]   Has the prescription been filled recently? Yes  Is the patient out of the medication? No, enough for today and tomorrow.   Has the patient been seen for an appointment in the last year OR does the patient have an upcoming appointment? Yes  Can we respond through MyChart? No, prefers phone call.   Agent: Please be advised that Rx refills may take up to 3 business days. We ask that you follow-up with your pharmacy.

## 2023-05-16 ENCOUNTER — Ambulatory Visit: Payer: Medicaid Other | Admitting: Emergency Medicine

## 2023-05-16 ENCOUNTER — Encounter: Payer: Self-pay | Admitting: Emergency Medicine

## 2023-05-16 VITALS — BP 124/84 | HR 97 | Ht 67.0 in | Wt 229.4 lb

## 2023-05-16 DIAGNOSIS — D869 Sarcoidosis, unspecified: Secondary | ICD-10-CM

## 2023-05-16 DIAGNOSIS — J4520 Mild intermittent asthma, uncomplicated: Secondary | ICD-10-CM | POA: Diagnosis not present

## 2023-05-16 MED ORDER — BREZTRI AEROSPHERE 160-9-4.8 MCG/ACT IN AERO: 2.0000 | INHALATION_SPRAY | Freq: Two times a day (BID) | RESPIRATORY_TRACT | 3 refills | Status: DC

## 2023-05-16 NOTE — Assessment & Plan Note (Signed)
 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  Plan to continue his current bronchodilator regimen  We reviewed your CT scan of the chest from December today. Please continue your Breztri 2 puffs twice a day.  Rinse and gargle after using. Keep your albuterol available to use 2 puffs when needed for shortness of breath, chest tightness, wheezing. get this done in the next few months. Follow Dr. Delton Coombes in about 4 months to review your pulmonary function testing.  Please call sooner if you have any problems.

## 2023-05-16 NOTE — Assessment & Plan Note (Signed)
 He had a repeat CT scan of the chest in December, overall stable.  He needs surveillance pulmonary function testing we will do those this year.  Plan for about 3 months and then follow-up to review.  He follows with Dr. Dimple Casey for his immunosuppression, currently on methotrexate, prednisone.  Stable dosing.  He will let me and also Dr. Dimple Casey know if he has a change in his breathing or notices progressive rash.  Continue Bactrim prophylaxis

## 2023-05-16 NOTE — Patient Instructions (Signed)
 We reviewed your CT scan of the chest from December today. Please continue your Breztri 2 puffs twice a day.  Rinse and gargle after using. Keep your albuterol available to use 2 puffs when needed for shortness of breath, chest tightness, wheezing. Continue your methotrexate, prednisone and Bactrim as directed by Dr. Dimple Casey. We will plan to repeat your pulmonary function testing this year.  We will try to get this done in the next few months. Follow Dr. Delton Coombes in about 4 months to review your pulmonary function testing.  Please call sooner if you have any problems.

## 2023-05-16 NOTE — Progress Notes (Signed)
 Subjective:    Patient ID: Nathan [name], male    DOB: Jun 08, 1970, 53 y.o.   MRN: [id number]  HPI  11/02/2022 --20-month follow-up visit for 53 year old gentleman with a history of former tobacco use and sarcoidosis with pulmonary cutaneous manifestations.  No evidence of cardiac involvement on cardiac PET.  He has severe obstructive lung disease.  He is currently managed by Dr. Dimple Casey with rheumatology on methotrexate + pred 5mg .  We and Trelegy at his last visit.  We have been managing him on Breztri.  Today he reports that he has been dealing with neck and back pain, seeing pain specialists. His breathing is about the same. He is limited with some exertion, even walking a distance. He is using breztri reliably. He uses albuterol quite rarely, few times a month.   CT chest 10/19/2022 reviewed by me similar bulky hilar and mediastinal adenopathy cylindrical varicose bronchiectasis with peribronchovascular thickening similar to priors no new masses, nodules present.  ROV 05/16/23 --Nathan [name] is a 53 year old gentleman with a history of former tobacco use, sarcoidosis with lung and skin manifestations.  He has had a reassuring cardiac PET scan in the past.  He has associated severe obstructive lung disease/COPD.  He is on methotrexate and prednisone currently 5 mg, prophylactic Bactrim. We have been managing him on Breztri, Zyrtec, uses albuterol approximately few times a month.  He feels that his breathing is doing ok - he will have some wheeze and exertional SOB with stairs. Also with walking uphill. Overall stable, but clearly noticeable. No sputum production. No new skin manifestations.   CT-PA 02/04/2023 reviewed by me showed bands of nodular opacity in peribronchovascular subpleural pattern with some paraseptal fibrotic change.  Significant mediastinal and hilar lymphadenopathy with some calcification.  There was no pulmonary embolism    Review of Systems As per HPI     Objective:   Physical  Exam Vitals:   05/16/23 1108  BP: 124/84  Pulse: 97  SpO2: 96%  Weight: 229 lb 6.4 oz (104.1 kg)  Height: 5\' 7"  (1.702 m)   Gen: Pleasant, well-nourished, in no distress,  normal affect  ENT: No lesions, the previously noted paranasal and periorbital skin changes have resolved  Neck: No JVD, no stridor  Lungs: No use of accessory muscles, no crackles or wheezing on normal respiration, no wheeze on forced expiration  Cardiovascular: RRR, heart sounds normal, no murmur or gallops, no peripheral edema  Musculoskeletal: No deformities, no cyanosis or clubbing  Neuro: alert, awake, non focal  Skin: No evidence of rash on the face or upper extremities      Assessment & Plan:  Sarcoidosis He had a repeat CT scan of the chest in December, overall stable.  He needs surveillance pulmonary function testing we will do those this year.  Plan for about 3 months and then follow-up to review.  He follows with Dr. Dimple Casey for his immunosuppression, currently on methotrexate, prednisone.  Stable dosing.  He will let me and also Dr. Dimple Casey know if he has a change in his breathing or notices progressive rash.  Continue Bactrim prophylaxis  Asthma Associated with his sarcoidosis.  Plan to continue his current bronchodilator regimen  We reviewed your CT scan of the chest from December today. Please continue your Breztri 2 puffs twice a day.  Rinse and gargle after using. Keep your albuterol available to use 2 puffs when needed for shortness of breath, chest tightness, wheezing. get this done in the next few months. Follow  Dr. Delton Coombes in about 4 months to review your pulmonary function testing.  Please call sooner if you have any problems.    Levy Pupa, MD, PhD 05/16/2023, 11:30 AM [facility] [phone] or if no answer [phone]

## 2023-05-16 NOTE — Addendum Note (Signed)
 Addended byFloydene Flock, Coreon Simkins A on: 05/16/2023 11:32 AM   Modules accepted: Orders

## 2023-06-01 ENCOUNTER — Other Ambulatory Visit: Payer: Self-pay | Admitting: Internal Medicine

## 2023-06-06 NOTE — Progress Notes (Signed)
 Office Visit Note  Patient: Nathan [name]             Date of Birth: 1971-01-22           MRN: [id number]             PCP: [facility] Referring: No ref. provider found Visit Date: 06/18/2023   Subjective:  Follow-up    Discussed the use of AI scribe software for clinical note transcription with the patient, who gave verbal consent to proceed.  History of Present Illness   Nathan [name] is a 53 y.o. male here for follow up  for sarcoidosis with pulmonary, articular, and cutaneous disease involvement currently on methotrexate  20 mg p.o. weekly folic acid  1 mg daily and prednisone  5 mg daily.    His last pulmonary function test showed significant obstruction, and he is scheduled for another test on August 4th. He describes difficulty with air movement due to narrowed or reactive airways, likening it to a 'big bagpipe' with narrowed pipes. He uses inhaler treatments, which are more effective for his obstructive problem. He experiences increased shortness of breath with exertion, such as climbing stairs or walking uphill, and becomes winded more easily than before. His wife noticed a mention of COPD in his medical chart, and he inquires about this diagnosis. He has a history of sarcoidosis, which typically causes restrictive lung problems, but his current issue is more obstructive in nature. He is not currently requiring oxygen therapy or CPAP at night.  He reports ongoing neck pain and is scheduled for another injection on the 12th, though he is unsure of the type of injection. He previously had an x-ray in March due to a pinched nerve, which was persistent and led him to the emergency room.  He is taking Bactrim  on Monday, Wednesday, and Friday due to his medication regimen. He recently had teeth extracted and was on antibiotics for that procedure. He is awaiting fitting for dentures and a partial.  He has been experiencing balance issues and is scheduled  to see an ENT specialist for evaluation of possible vertigo. He was prescribed medication to chew as needed for this issue, but he does not find it effective.  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 His ankles feel tight and sometimes painful, especially in the morning.       Previous HPI 03/21/2023 Nathan [name] is a 53 y.o. male here for follow up for sarcoidosis with pulmonary, articular, and cutaneous disease involvement currently on methotrexate  20 mg p.o. weekly folic acid  1 mg daily and prednisone  5 mg daily.     He experiences ongoing neck and back pain, which he attributes to a pinched nerve. The pain radiates down his arm, affecting his ability to sit or stand for prolonged periods. He has undergone multiple procedures, most recently had spinal epidural injection about a month ago, which provided temporary relief for two days. This was after recent updated MRI showing what sounds like multilevel degenerative disease. He continues to take methotrexate  every Wednesday night and prednisone  daily. No leg swelling or glandular swelling. Pain is present in the neck and shoulders, with radiation down the arm.   He has a history of sarcoidosis, currently managed with methotrexate  and prednisone . The treatment has been ongoing, and he has experienced some improvement in joint symptoms with low-dose prednisone .  He recalls a previous treatment with high-dose prednisone , which led to weight gain.   He reports a skin rash that has been intermittently dry, for which he has been using a prescribed cream. He has not noticed any other rashes besides the one mentioned.   He is concerned about his ability to work due to his symptoms and has filed for disability. He mentions having undergone a functional capacity exam to assess his physical limitations. He recalls an episode from  December where he visited the emergency room due to severe pain from a pinched nerve, during which he was prescribed a medrol  dose pack and muscle relaer. He has not been sick recently.     12/06/2022 Nathan [name] is a 53 y.o. male here for follow up for sarcoidosis with pulmonary, articular, and cutaneous disease involvement currently on methotrexate  20 mg p.o. weekly folic acid  1 mg daily and prednisone  5 mg daily.  He has had persistent pain in his neck and some shooting radiating type pain more down the right arm than down the left arm.  He is experiencing stiffness and numbness in the hands more after prolonged use especially holding a tight grip.  A little bit of pain around the MCP joints of the hand otherwise more just trouble gripping than actual pain.  He had an MRI of the neck yesterday that was not yet read.  Has a follow-up planned with pain management for this.  He had recent labs checked that showed normal renal function.  Saw dermatology and was started with topical medication appears to be triamcinolone 0.1% for use on the ongoing facial rashes.  So far these have faded in color a little bit but not gone away.  He had follow-up with Dr. Baldwin Levee no concern of worsening pulmonary status.  He did have to take antibiotics for dental abscess suspected at the right upper molars.   08/29/2022 Nathan [name] is a 53 y.o. male here for follow up for sarcoidosis with pulmonary, articular, and cutaneous disease involvement currently on methotrexate  20 mg p.o. weekly folic acid  1 mg daily and prednisone  5 mg daily.  Skin rashes are doing well since her last visit.  [facility] for ongoing neck and upper back pain with trial of steroid injection to the neck with partial benefit.  He is also been recommended if effectiveness is low for possible trigger point injection at multiple sites in the neck and upper back.  He had increase in cholesterol medication with his  PCP onto a atorvastatin  and ezetimibe noticing some increased muscle pain and cramping so discontinued the ezetimibe for about 1 week.  At that visit on July 8 had an elevated CK level of 662.  Outside of these areas joint pain is doing well and not noticed any significant swelling in his arms or legs.  Still has some persistent cough swallowing difficulty and shortness of breath with exertion plans for upcoming repeat CT scan and pulmonology follow-up.  He is also been referred for seeing gastroenterology based on previous findings of laryngeal pharyngeal reflux disease.   05/30/22 Nathan [name] is a 53 y.o. male here for follow up for sarcoidosis with pulmonary articular and cutaneous disease involvement currently on methotrexate  20 mg p.o. weekly folic acid  1 mg daily and prednisone  5 mg daily.  Rash is somewhat improved more since her last visit.  Follow-up with pulmonology clinic not clear whether there is active disease plan for follow-up this  summer with repeat lung study to assess this.  He saw the ENT doctor noticed excessive mucus on exam suspicious for reflux for his upper airway cough syndrome issue.  He saw spine specialist for his neck and upper back pain initially started low-dose Lyrica  and tramadol  while getting outside records and workup and has follow-up scheduled later this week.   02/28/22 Nathan [name] is a 53 y.o. male here for follow up for sarcoidosis with pulmonary and cutaneous involvement on methotrexate  15 mg p.o. weekly and folic acid  1 mg daily.  Since her last visit he had PET scan that looked good no evidence of cardiac sarcoidosis activity.  CT scan in October with stable lymphadenopathy and bandlike and nodular opacities in the lungs, although the associated nondiagnostic CT in December was consistent with hypermetabolic mediastinal and hilar lymphadenopathy and bilateral pulmonary parenchymal opacities consistent with inflammatory process and active granulomatous  disease.  He is tolerating the methotrexate  without any difficulty.  Still been noticing some ongoing skin rash with minimally raised hyperpigmented spots along the medial border of the eyes.  He has not felt a significant improvement with his neck pain on the medication.  Still feels restriction in his range of motion especially looking towards the right and gets pain painful experiences towards the left side.   12/19/21 Nathan [name] is a 53 y.o. male here for follow up for joint pains with history of pulmonary and cutaneous sacoidosis now on methotrexate  15 mg PO weekly and folic acid  1 mg daily. He has not noticed any problems with the medication but also no improvement in symptoms so far. Still pain worst in the neck and upper back with radiation into back and with headaches. Asthma symptoms doing somewhat worse this year. He had cardiac MRI but is now recommended for cardiac PET scan to evaluate but not scheduled yet.   Previous HPI 11/15/2021 Nathan [name] is a 53 y.o. male here for joint pains with history of pulmonary and cutaneous sacoidosis.  His worst affected area is pretty constant pain in the base of the neck area frequently with radiation up the neck and posterior headaches associated with this.  He has had problems going on for years but seems to be progressively getting somewhat worse.  He does get some pain in extremities elsewhere but by far the most limiting for him is in the base of the neck.  He has tried multiple NSAIDs including meloxicam  and Celebrex is also tried muscle relaxant medications none of which were very significantly beneficial.  He is tried physical therapy completing courses and had local injections that did not greatly relieve symptoms.  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  He had most recent neck MRI in 2022 based on findings had discussed surgery options but is looking to avoid this due to concern about complications or long-term  needing additional revision. Original diagnosis of sarcoidosis was based on pulmonary imaging finding and treated with very prolonged prednisone  taper.  Initially only had respiratory symptoms with some mild dyspnea and coughing that improved with the steroid treatments.  More recently this year he had an episode of similar symptoms coming back and was treated with a round of steroids from pulmonology clinic in noticed a increase in blood glucose up to 400s.  He is also developed some new skin rashes on the face previously had involvement on extremities but no facial rashes with original symptoms.  He is prescribed topical medication by Dr. Steen Eden but  has not seen any difference so far.   Review of Systems  Constitutional:  Positive for fatigue.  HENT:  Negative for mouth sores and mouth dryness.   Eyes:  Positive for dryness.  Respiratory:  Positive for shortness of breath.   Cardiovascular:  Positive for chest pain and palpitations.  Gastrointestinal:  Positive for constipation. Negative for blood in stool and diarrhea.  Endocrine: Negative for increased urination.  Genitourinary:  Negative for involuntary urination.  Musculoskeletal:  Positive for joint pain, gait problem, joint pain, joint swelling, myalgias, muscle weakness, morning stiffness, muscle tenderness and myalgias.  Skin:  Positive for color change and sensitivity to sunlight. Negative for rash and hair loss.  Allergic/Immunologic: Negative for susceptible to infections.  Neurological:  Positive for dizziness and headaches.  Hematological:  Negative for swollen glands.  Psychiatric/Behavioral:  Positive for depressed mood and sleep disturbance. The patient is nervous/anxious.     PMFS History:  Patient Active Problem List   Diagnosis Date Noted   Type 2 diabetes mellitus without complication, without long-term current use of insulin (HCC) 06/22/2023   Cervical spine pain 08/29/2022   Myalgia 08/29/2022   High risk medication  use 11/15/2021   HLD (hyperlipidemia) 09/13/2021   Asthma 11/10/2020   COVID-19 virus infection 10/21/2019   Shoulder pain, left 10/23/2016   Other chest pain 08/22/2014   Upper airway cough syndrome 07/23/2014   Sarcoidosis 03/06/2011    Past Medical History:  Diagnosis Date   Acid reflux disease    Arthritis    Asthma    'I think I might have asthma"   Chronic back pain    Dyspnea    chronic   Hyperlipidemia    Hypertension    Sarcoidosis     Family History  Problem Relation Age of Onset   Hypertension Mother    Diabetes Mother    Heart attack Father    Diabetes Father    Hypertension Father    Gout Father    Colon polyps Sister    Heart disease Paternal Uncle    Prostate cancer Paternal Uncle    Heart disease Maternal Grandfather    Anemia Daughter    Anemia Daughter    Colon cancer Neg Hx    Esophageal cancer Neg Hx    Rectal cancer Neg Hx    Stomach cancer Neg Hx    Past Surgical History:  Procedure Laterality Date   NO PAST SURGERIES     Social History   Social History Narrative   Not on file   Immunization History  Administered Date(s) Administered   Dtap, Unspecified 11/09/1972, 01/07/1973, 03/01/1973, 03/24/1974, 12/06/1977   Influenza,inj,Quad PF,6+ Mos 11/10/2020, 02/16/2022   Measles 09/23/1973   Mumps 03/24/1974   PFIZER Comirnaty(Gray Top)Covid-19 Tri-Sucrose Vaccine 10/28/2020   PFIZER(Purple Top)SARS-COV-2 Vaccination 10/27/2020   PNEUMOCOCCAL CONJUGATE-20 02/16/2022   Polio, Unspecified 11/09/1972, 01/07/1973, 03/11/1973, 03/24/1974, 12/06/1977   Rubella 09/23/1973   Tdap 02/16/2022     Objective: Vital Signs: BP 101/67 (BP Location: Left Arm, Patient Position: Sitting)   Pulse 92   Resp 16   Ht 5\' 8"  (1.727 m)   Wt 230 lb 9.6 oz (104.6 kg)   BMI 35.06 kg/m    Physical Exam Eyes:     Conjunctiva/sclera: Conjunctivae normal.  Cardiovascular:     Rate and Rhythm: Normal rate and regular rhythm.  Pulmonary:     Effort:  Pulmonary effort is normal.     Breath sounds: Normal breath sounds.  Lymphadenopathy:  Cervical: No cervical adenopathy.  Skin:    General: Skin is warm and dry.     Comments: Flat, mildly hyperpigmented skin at medial border of eyes  Neurological:     Mental Status: He is alert.  Psychiatric:        Mood and Affect: Mood normal.      Musculoskeletal Exam:  Neck more restricted rightward, provokes pain radiating to right shoulder Shoulders full ROM no tenderness or swelling Elbows full ROM no tenderness or swelling Wrists full ROM no tenderness or swelling Fingers full ROM no tenderness or swelling Knees full ROM, mild tenderness, no effusion Ankles restricted but nontender    Investigation: No additional findings.  Imaging: No results found.  Recent Labs: Lab Results  Component Value Date   WBC 5.1 06/18/2023   HGB 13.5 06/18/2023   PLT 341 06/18/2023   NA 140 06/18/2023   K 3.8 06/18/2023   CL 99 06/18/2023   CO2 33 (H) 06/18/2023   GLUCOSE 101 (H) 06/18/2023   BUN 16 06/18/2023   CREATININE 0.65 (L) 06/18/2023   BILITOT 0.6 06/18/2023   ALKPHOS 62 04/28/2023   AST 27 06/18/2023   ALT 25 06/18/2023   PROT 7.1 06/18/2023   ALBUMIN 4.5 04/28/2023   CALCIUM  9.4 06/18/2023   GFRAA >60 09/17/2019    Speciality Comments: No specialty comments available.  Procedures:  No procedures performed Allergies: Atorvastatin , Rosuvastatin , Tizanidine , Zetia [ezetimibe], Hydrocodone -acetaminophen , Oxycodone -acetaminophen , Percocet [oxycodone -acetaminophen ], and Vicodin [hydrocodone -acetaminophen ]   Assessment / Plan:     Visit Diagnoses: Sarcoidosis - Plan: Sedimentation rate, methotrexate  (RHEUMATREX) 2.5 MG tablet, predniSONE  (DELTASONE ) 5 MG tablet Inflammation symptoms appear to be well-controlled.  No peripheral synovitis or skin inflammation, pulmonary symptoms stable. - Continue methotrexate  20 mg PO weekly and folic acid  1 mg daily - Continue prednisone  5  mg daily  High risk medication use - methotrexate  20 mg p.o. weekly folic acid  1 mg daily - Plan: CBC with Differential/Platelet, Comprehensive metabolic panel with GFR - Checking CBC and CMP for medication monitoring for continued long-term use of methotrexate   Long term (current) use of systemic steroids - prednisone  5 mg daily.  Cervical spine pain - Follow-up with pain specialist for further management options  Balance issues Balance issues with possible vertigo. Current medication ineffective. ENT evaluation scheduled. - Attend scheduled ENT appointment.  Chronic Obstructive Pulmonary Disease (COPD) COPD with obstructive pattern. Exertional dyspnea present. Inhaler effective. No oxygen or CPAP needed. Pulmonary function test scheduled. - Continue current inhaler treatment. - Recheck pulmonary function test in three months.        Orders: Orders Placed This Encounter  Procedures   Sedimentation rate   CBC with Differential/Platelet   Comprehensive metabolic panel with GFR   Meds ordered this encounter  Medications   methotrexate  (RHEUMATREX) 2.5 MG tablet    Sig: Take 8 tablets (20 mg total) by mouth once a week. Caution:Chemotherapy. Protect from light.    Dispense:  96 tablet    Refill:  1   predniSONE  (DELTASONE ) 5 MG tablet    Sig: Take 1 tablet (5 mg total) by mouth daily with breakfast.    Dispense:  90 tablet    Refill:  1     Follow-Up Instructions: Return in about 4 months (around 10/19/2023) for Sarcoidosis on MTX/GC f/u 4mos.   Matt Song, MD  Note - This record has been created using [employer].  Chart creation errors have been sought, but may not always  have been located.  Such creation errors do not reflect on  the standard of medical care.

## 2023-06-08 ENCOUNTER — Other Ambulatory Visit (HOSPITAL_COMMUNITY): Payer: Self-pay

## 2023-06-08 ENCOUNTER — Telehealth: Payer: Self-pay

## 2023-06-08 NOTE — Telephone Encounter (Signed)
*  Pulm  Pharmacy Patient Advocate Encounter   Received notification from CoverMyMeds that prior authorization for Breztri  is required/requested.   Insurance verification completed.   The patient is insured through  [employer]  .   Per test claim: PA required; PA submitted to above mentioned insurance via CoverMyMeds Key/confirmation #/EOC [id number] Status is pending

## 2023-06-12 NOTE — Telephone Encounter (Signed)
 Request Reference Number: [id number]. BREZTRI  AERO AER SPHERE is approved through 06/07/2024. For further questions, call [employer] at [phone]. Effective Date: 06/08/2023 Authorization Expiration Date: 06/07/2024

## 2023-06-18 ENCOUNTER — Ambulatory Visit: Payer: Medicaid Other | Attending: Internal Medicine | Admitting: Internal Medicine

## 2023-06-18 ENCOUNTER — Encounter: Payer: Self-pay | Admitting: Internal Medicine

## 2023-06-18 VITALS — BP 101/67 | HR 92 | Resp 16 | Ht 68.0 in | Wt 230.6 lb

## 2023-06-18 DIAGNOSIS — D869 Sarcoidosis, unspecified: Secondary | ICD-10-CM

## 2023-06-18 DIAGNOSIS — Z79899 Other long term (current) drug therapy: Secondary | ICD-10-CM

## 2023-06-18 DIAGNOSIS — M542 Cervicalgia: Secondary | ICD-10-CM | POA: Diagnosis present

## 2023-06-18 DIAGNOSIS — Z7952 Long term (current) use of systemic steroids: Secondary | ICD-10-CM | POA: Diagnosis present

## 2023-06-18 LAB — COMPREHENSIVE METABOLIC PANEL WITH GFR
AG Ratio: 1.8 (calc) (ref 1.0–2.5)
ALT: 25 U/L (ref 9–46)
AST: 27 U/L (ref 10–35)
Albumin: 4.6 g/dL (ref 3.6–5.1)
Alkaline phosphatase (APISO): 71 U/L (ref 35–144)
BUN/Creatinine Ratio: 25 (calc) — ABNORMAL HIGH (ref 6–22)
BUN: 16 mg/dL (ref 7–25)
CO2: 33 mmol/L — ABNORMAL HIGH (ref 20–32)
Calcium: 9.4 mg/dL (ref 8.6–10.3)
Chloride: 99 mmol/L (ref 98–110)
Creat: 0.65 mg/dL — ABNORMAL LOW (ref 0.70–1.30)
Globulin: 2.5 g/dL (ref 1.9–3.7)
Glucose, Bld: 101 mg/dL — ABNORMAL HIGH (ref 65–99)
Potassium: 3.8 mmol/L (ref 3.5–5.3)
Sodium: 140 mmol/L (ref 135–146)
Total Bilirubin: 0.6 mg/dL (ref 0.2–1.2)
Total Protein: 7.1 g/dL (ref 6.1–8.1)
eGFR: 115 mL/min/{1.73_m2} (ref >=60)

## 2023-06-18 LAB — CBC WITH DIFFERENTIAL/PLATELET
Absolute Lymphocytes: 704 {cells}/uL — ABNORMAL LOW (ref 850–3900)
Absolute Monocytes: 576 {cells}/uL (ref 200–950)
Basophils Absolute: 20 {cells}/uL (ref 0–200)
Basophils Relative: 0.4 %
Eosinophils Absolute: 189 {cells}/uL (ref 15–500)
Eosinophils Relative: 3.7 %
HCT: 41.1 % (ref 38.5–50.0)
Hemoglobin: 13.5 g/dL (ref 13.2–17.1)
MCH: 28.7 pg (ref 27.0–33.0)
MCHC: 32.8 g/dL (ref 32.0–36.0)
MCV: 87.3 fL (ref 80.0–100.0)
MPV: 9.5 fL (ref 7.5–12.5)
Monocytes Relative: 11.3 %
Neutro Abs: 3611 {cells}/uL (ref 1500–7800)
Neutrophils Relative %: 70.8 %
Platelets: 341 10*3/uL (ref 140–400)
RBC: 4.71 10*6/uL (ref 4.20–5.80)
RDW: 13.1 % (ref 11.0–15.0)
Total Lymphocyte: 13.8 %
WBC: 5.1 10*3/uL (ref 3.8–10.8)

## 2023-06-18 LAB — SEDIMENTATION RATE: Sed Rate: 9 mm/h (ref 0–15)

## 2023-06-18 MED ORDER — METHOTREXATE SODIUM 2.5 MG PO TABS: 20.0000 mg | ORAL_TABLET | ORAL | 1 refills | Status: DC

## 2023-06-18 MED ORDER — PREDNISONE 5 MG PO TABS: 5.0000 mg | ORAL_TABLET | Freq: Every day | ORAL | 1 refills | Status: DC

## 2023-06-19 DIAGNOSIS — N529 Male erectile dysfunction, unspecified: Secondary | ICD-10-CM | POA: Insufficient documentation

## 2023-06-22 ENCOUNTER — Telehealth: Payer: Self-pay | Admitting: Internal Medicine

## 2023-06-22 ENCOUNTER — Ambulatory Visit: Attending: Cardiology | Admitting: Pharmacist

## 2023-06-22 ENCOUNTER — Telehealth: Payer: Self-pay

## 2023-06-22 ENCOUNTER — Other Ambulatory Visit (HOSPITAL_COMMUNITY): Payer: Self-pay

## 2023-06-22 DIAGNOSIS — E119 Type 2 diabetes mellitus without complications: Secondary | ICD-10-CM | POA: Insufficient documentation

## 2023-06-22 MED ORDER — SEMAGLUTIDE(0.25 OR 0.5MG/DOS) 2 MG/3ML ~~LOC~~ SOPN: PEN_INJECTOR | SUBCUTANEOUS | 1 refills | Status: DC

## 2023-06-22 NOTE — Telephone Encounter (Signed)
 Pharmacy Patient Advocate Encounter   Received notification from Physician's Office that prior authorization for OZEMPIC is required/requested.   Insurance verification completed.   The patient is insured through [facility] MEDICAID .   Per test claim: PA required; PA submitted to above mentioned insurance via CoverMyMeds Key/confirmation #/EOC [id number] Status is pending

## 2023-06-22 NOTE — Telephone Encounter (Signed)
 Called and LVM for pt to call back Rx sent to [facility] Need to remind pt to stop Januvia

## 2023-06-22 NOTE — Telephone Encounter (Signed)
 Pharmacy Patient Advocate Encounter  Received notification from [facility] MEDICAID that Prior Authorization for [facility] has been APPROVED from 06/22/23 to 06/21/24. Ran test claim, Copay is $4. This test claim was processed through [facility]- copay amounts may vary at other pharmacies due to pharmacy/plan contracts, or as the patient moves through the different stages of their insurance plan.

## 2023-06-22 NOTE — Telephone Encounter (Signed)
-----   Message from Benancio Bracket sent at 06/22/2023 11:23 AM EDT ----- Please do PA for ozempic. A1C in care everywhere.

## 2023-06-22 NOTE — Telephone Encounter (Signed)
Patient called requesting a return call to discuss the results of his labwork.

## 2023-06-22 NOTE — Progress Notes (Signed)
 Patient ID: Nathan [name]                 DOB: 10/13/1970                    MRN: [id number]     HPI: Nathan [name] is a 53 y.o. male patient referred to pharmacy clinic by Dr. Lorie [name] to initiate GLP1-RA therapy. PMH is significant for pulmonary sarcoidosis, asthma, hyperlipidemia, aortic stenosis, diabetes, and obesity. Most recent BMI 35.  Baseline weight and BMI: 230 pounds, 35  Patient presents today to discuss GLP-1 therapy.  Patient has started making some changes to his diet and has successfully lost about 10 pounds.  He has been trying to reduce the meat in his diet and is trying to eat more vegetables.  He says that he does not think that he can cook any vegetables so he is thinking about putting them in smoothies.  His physical activity is limited due to chronic pain.  He also has been having some issues with his balance.  Comes today with a cane.  We reviewed the cardiovascular risk reduction data with Ozempic.  Although this data was with patients with established cardiovascular disease and patient is still primary prevention. Patient is determined to live a healthier life as he has 2 grandsons he wants to see grow up.  A1c in 2023 was 7.2.  Has been as high as 10.8.  His new pharmacy is [facility] and he prefers to be contacted via phone.  Diet:  Cut out meats More fruit, trying in eat more vegetables Eating more beans Decreasing portions  Exercise: chronic pain, balance issues  Family History:  Family History  Problem Relation Age of Onset   Hypertension Mother    Diabetes Mother    Heart attack Father    Diabetes Father    Hypertension Father    Gout Father    Colon polyps Sister    Heart disease Paternal Uncle    Prostate cancer Paternal Uncle    Heart disease Maternal Grandfather    Anemia Daughter    Anemia Daughter    Colon cancer Neg Hx    Esophageal cancer Neg Hx    Rectal cancer Neg Hx    Stomach cancer Neg Hx      Social History: No  tobacco, no illicit drugs, no ETOH  Labs: Lab Results  Component Value Date   HGBA1C 6.0 (H) 03/09/2020    Wt Readings from Last 1 Encounters:  06/18/23 230 lb 9.6 oz (104.6 kg)    BP Readings from Last 1 Encounters:  06/18/23 101/67   Pulse Readings from Last 1 Encounters:  06/18/23 92       Component Value Date/Time   CHOL 112 01/15/2023 1101   TRIG 103 01/15/2023 1101   HDL 38 (L) 01/15/2023 1101   CHOLHDL 2.9 01/15/2023 1101   LDLCALC 55 01/15/2023 1101    Past Medical History:  Diagnosis Date   Acid reflux disease    Arthritis    Asthma    'I think I might have asthma"   Chronic back pain    Dyspnea    chronic   Hyperlipidemia    Hypertension    Sarcoidosis     Current Outpatient Medications on File Prior to Visit  Medication Sig Dispense Refill   albuterol  (VENTOLIN  HFA) 108 (90 Base) MCG/ACT inhaler Inhale 2 puffs into the lungs every 6 (six) hours as needed for wheezing or shortness  of breath. 18 g 6   amitriptyline (ELAVIL) 10 MG tablet Take 10 mg by mouth at bedtime.     aspirin  EC 81 MG tablet Take 1 tablet (81 mg total) by mouth daily. Swallow whole. 90 tablet 3   budeson-glycopyrrolate-formoterol  (BREZTRI  AEROSPHERE) 160-9-4.8 MCG/ACT AERO Inhale 2 puffs into the lungs 2 (two) times daily. 10.7 g 3   buprenorphine (BUTRANS) 15 MCG/HR 1 patch to skin Transdermal every week for 28 days (Patient not taking: Reported on 06/18/2023)     BUTRANS 20 MCG/HR PTWK 1 patch once a week. (Patient not taking: Reported on 06/18/2023)     celecoxib (CELEBREX) 100 MG capsule Take 100 mg by mouth 2 (two) times daily.     cetirizine  (ZYRTEC ) 10 MG tablet Take 1 tablet by mouth daily.     Cholecalciferol (VITAMIN D3) 10 MCG (400 UNIT) tablet Take 2 tablets by mouth daily.     cyclobenzaprine  (FLEXERIL ) 10 MG tablet      Docusate Sodium (DSS) 100 MG CAPS Take by mouth.     empagliflozin  (JARDIANCE ) 10 MG TABS tablet Take 1 tablet (10 mg total) by mouth daily before  breakfast. 30 tablet 5   empagliflozin  (JARDIANCE ) 10 MG TABS tablet Take 1 tablet (10 mg total) by mouth daily before breakfast. (Patient not taking: Reported on 06/18/2023) 28 tablet 0   Evolocumab  (REPATHA  SURECLICK) 140 MG/ML SOAJ Inject 140 mg into the skin every 14 (fourteen) days. 6 mL 3   fluticasone  (FLONASE ) 50 MCG/ACT nasal spray Place into the nose.     folic acid  (FOLVITE ) 1 MG tablet Take 1 tablet (1 mg total) by mouth daily. 90 tablet 3   furosemide  (LASIX ) 20 MG tablet Take 0.5 tablets (10 mg total) by mouth daily. 45 tablet 3   HYDROcodone -acetaminophen  (NORCO) 7.5-325 MG tablet Take 1 tablet by mouth 3 (three) times daily as needed.     hydrocortisone 2.5 % cream Apply topically.     JANUVIA 50 MG tablet Take 1 tablet by mouth daily.     loratadine (CLARITIN) 10 MG tablet Take 1 tablet by mouth daily.     Meclizine HCl 25 MG CHEW Chew 1 tablet by mouth 3 (three) times daily as needed.     metFORMIN (GLUCOPHAGE-XR) 500 MG 24 hr tablet Take 500 mg by mouth 2 (two) times daily.     methocarbamol  (ROBAXIN ) 500 MG tablet Take 1 tablet (500 mg total) by mouth every 8 (eight) hours as needed for muscle spasms. (Patient not taking: Reported on 06/18/2023) 20 tablet 0   methotrexate  (RHEUMATREX) 2.5 MG tablet Take 8 tablets (20 mg total) by mouth once a week. Caution:Chemotherapy. Protect from light. 96 tablet 1   omeprazole (PRILOSEC) 20 MG capsule Take by mouth.     ondansetron  (ZOFRAN ) 4 MG tablet Take 1 tablet (4 mg total) by mouth every 6 (six) hours. (Patient not taking: Reported on 06/18/2023) 12 tablet 0   pravastatin (PRAVACHOL) 10 MG tablet Take 10 mg by mouth daily.     predniSONE  (DELTASONE ) 5 MG tablet Take 1 tablet (5 mg total) by mouth daily with breakfast. 90 tablet 1   pregabalin  (LYRICA ) 200 MG capsule 1 capsule Oral every 12 hours for 30 days     sulfamethoxazole -trimethoprim  (BACTRIM  DS) 800-160 MG tablet TAKE 1 TABLET BY MOUTH THREE TIMES WEEKLY (ON MONDAY, WEDNESDAY &  FRIDAY) 12 tablet 11   tacrolimus  (PROTOPIC ) 0.1 % ointment Apply topically 2 (two) times daily. 100 g 11   triamcinolone  ointment (KENALOG) 0.1 % Apply 1 Application topically 2 (two) times daily. (Patient not taking: Reported on 06/18/2023)     valsartan  (DIOVAN ) 80 MG tablet Take 1 tablet (80 mg total) by mouth daily. 90 tablet 3   No current facility-administered medications on file prior to visit.    Allergies  Allergen Reactions   Atorvastatin      Hand cramping on 10-20mg  daily   Rosuvastatin      Hand cramping on 5mg  daily   Tizanidine  Itching   Zetia [Ezetimibe]     Hand cramping   Hydrocodone -Acetaminophen  Itching   Oxycodone -Acetaminophen  Itching   Percocet [Oxycodone -Acetaminophen ] Itching    Able to take tylenol    Vicodin [Hydrocodone -Acetaminophen ] Itching     Assessment/Plan:  1. Weight loss - Patient has not met goal of at least 5% of body weight loss with comprehensive lifestyle modifications alone in the past 3-6 months. Pharmacotherapy is appropriate to pursue as augmentation. Will start Ozempic 0.25 mg weekly.  Stop Januvia confirmed patient has no personal or family history of medullary thyroid carcinoma (MTC) or Multiple Endocrine Neoplasia syndrome type 2 (MEN 2).  No history of pancreatitis or gallstones.  Injection technique reviewed at today's visit.  Advised patient on common side effects including nausea, diarrhea, dyspepsia, decreased appetite, and fatigue. Counseled patient on reducing meal size and how to titrate medication to minimize side effects. Counseled patient to call if intolerable side effects or if experiencing dehydration, abdominal pain, or dizziness. Patient will adhere to dietary modifications and will target at least 150 minutes of moderate intensity exercise weekly.   Handout provided on nutrition.  Patient advised he will need to stop Januvia when starting Ozempic.  Follow up in 1 month via telephone for tolerability update and dose  titration.  Tiondra Fang D Daisie Haft, Pharm.Monika Annas, CPP [facility] of [facility] [address], [place], [place] [address]  Phone: [phone]; Fax: [phone]

## 2023-06-22 NOTE — Addendum Note (Signed)
 Addended by: Matix Henshaw D on: 06/22/2023 04:18 PM   Modules accepted: Orders

## 2023-06-22 NOTE — Patient Instructions (Signed)

## 2023-06-22 NOTE — Telephone Encounter (Signed)
 Patient called back. Reminded to stop Januvia. Will follow up with him in 3 -4 weeks

## 2023-06-25 ENCOUNTER — Telehealth: Payer: Self-pay | Admitting: Pharmacy Technician

## 2023-06-25 ENCOUNTER — Other Ambulatory Visit (HOSPITAL_COMMUNITY): Payer: Self-pay

## 2023-06-25 ENCOUNTER — Telehealth: Payer: Self-pay | Admitting: Pharmacist

## 2023-06-25 MED ORDER — REPATHA SURECLICK 140 MG/ML ~~LOC~~ SOAJ: 140.0000 mg | SUBCUTANEOUS | 11 refills | Status: AC

## 2023-06-25 NOTE — Telephone Encounter (Signed)
 Hi, the patient called back and said he gets his medications from [facility] pharmacy now. I called [facility] and they said they received a profile transfer from [facility] in April and repatha  was not on his list. Can a prescription for repatha  be sent to [facility]? This will only go for 1 month on insurance. Thank you!

## 2023-06-25 NOTE — Telephone Encounter (Signed)
 Ran test claim for repatha . For a 28 day supply and the co-pay is 4.00 . Pa good until 11/10/2023  I called [facility] and asked them to run the prescription and they said their system is down and for me to call back later. I called the patient and had to leave a message

## 2023-06-25 NOTE — Telephone Encounter (Signed)
 Patient called back and advised pa is still good. He said he goes to [facility] now. [facility] said they do not have a rx for repatha . Message sent to [facility] to see if she can send in a rx to [facility] for repatha 

## 2023-06-25 NOTE — Telephone Encounter (Signed)
 Patient called stating his Repatha  needs PA

## 2023-06-28 ENCOUNTER — Telehealth: Payer: Self-pay | Admitting: Internal Medicine

## 2023-06-28 NOTE — Telephone Encounter (Signed)
 Spoke with patient and he has been having dizziness and unbalance. PCP sent him to ENT because they thought he had vertigo. ENT advised he did not have vertigo and it can becoming from his valsartan . No vitals to report at this. No chest pain, SOB, nausea or vomiting. He would like to know if his medication can be reduced or changed. Asymptomatic currently.  Advised when he is changing positions to change them slowly. When he start to feel dizzy have a seat so he does fall. Informed patient he should get a home BP cuff. ED precautions discussed will forward to provider.

## 2023-06-28 NOTE — Telephone Encounter (Signed)
 Spoke with patient and he is aware to try taking valsartan  at bedtime. He has not taken his dose this morning so he states he will start tonight. He will give us  a call back next Friday.

## 2023-06-28 NOTE — Telephone Encounter (Signed)
 Pt c/o medication issue:  1. Name of Medication: valsartan  (DIOVAN ) 80 MG tablet   2. How are you currently taking this medication (dosage and times per day)? No  3. Are you having a reaction (difficulty breathing--STAT)? No  4. What is your medication issue? Pt states he's been dizzy and his ENT Doctor told him it could be caused by the medication he's taking. Pt would like a smaller dosage. Please advise

## 2023-07-13 ENCOUNTER — Other Ambulatory Visit: Payer: Self-pay | Admitting: Internal Medicine

## 2023-07-13 DIAGNOSIS — D869 Sarcoidosis, unspecified: Secondary | ICD-10-CM

## 2023-07-18 ENCOUNTER — Telehealth: Payer: Self-pay | Admitting: Pharmacist

## 2023-07-18 ENCOUNTER — Encounter (HOSPITAL_BASED_OUTPATIENT_CLINIC_OR_DEPARTMENT_OTHER): Payer: Self-pay

## 2023-07-18 NOTE — Telephone Encounter (Signed)
 Called pt to follow up on ozempic . There was a delay in getting from his pharmacy so he has only taken 2 injections of 0.25mg  so far. Doing fine. Will follow up in another 4 weeks. He is aware to give 2 more injections of 0.25 and then increase to 0.5mg .

## 2023-08-06 ENCOUNTER — Other Ambulatory Visit: Payer: Self-pay | Admitting: Orthopedic Surgery

## 2023-08-06 DIAGNOSIS — M542 Cervicalgia: Secondary | ICD-10-CM

## 2023-08-25 ENCOUNTER — Ambulatory Visit
Admission: RE | Admit: 2023-08-25 | Discharge: 2023-08-25 | Disposition: A | Discharge status: Home / Self Care | Source: Ambulatory Visit | Attending: Orthopedic Surgery | Admitting: Orthopedic Surgery

## 2023-08-25 DIAGNOSIS — M542 Cervicalgia: Secondary | ICD-10-CM

## 2023-08-27 DIAGNOSIS — E349 Endocrine disorder, unspecified: Secondary | ICD-10-CM | POA: Insufficient documentation

## 2023-08-28 NOTE — Telephone Encounter (Signed)
 Patient reports doing well on Ozempic .  He is waiting for his refill to come in the mail but denies being out of medications.  He is on 0.5 mg of Ozempic  weekly.  States he had a doctor's appointment the other day and his A1c was 6.2.  I will call patient in 3 weeks to follow-up to discuss if we want to increase to 1 mg.

## 2023-08-31 ENCOUNTER — Telehealth: Payer: Self-pay | Admitting: Internal Medicine

## 2023-08-31 NOTE — Telephone Encounter (Signed)
*  STAT* If patient is at the pharmacy, call can be transferred to refill team.   1. Which medications need to be refilled? (please list name of each medication and dose if known) Semaglutide ,0.25 or 0.5MG /DOS, 2 MG/3ML SOPN    2. Would you like to learn more about the convenience, safety, & potential cost savings by using the [facility]?    3. Are you open to using the [facility] (Type [facility].  ).   4. Which pharmacy/location (including street and city if local pharmacy) is medication to be sent to? [facility] - [address], [place] - [address]    5. Do they need a 30 day or 90 day supply? 90 day

## 2023-09-03 ENCOUNTER — Telehealth: Payer: Self-pay | Admitting: Internal Medicine

## 2023-09-03 NOTE — Telephone Encounter (Signed)
 Pt called asking Dr. Jeannetta to clear him for an upcoming surgery. Pt would like to know if he needs to come in or if this could be handed over the phone. Pt would like a call back.

## 2023-09-04 NOTE — Telephone Encounter (Signed)
 I spoke with pt on 7/15 and he said he was waiting for med to come in mail. Looks like it was filled 7/8. He is pending increase to 1mg .

## 2023-09-11 ENCOUNTER — Other Ambulatory Visit: Payer: Self-pay | Admitting: Orthopedic Surgery

## 2023-09-17 ENCOUNTER — Encounter (HOSPITAL_BASED_OUTPATIENT_CLINIC_OR_DEPARTMENT_OTHER)

## 2023-09-17 ENCOUNTER — Ambulatory Visit: Admitting: Emergency Medicine

## 2023-09-17 DIAGNOSIS — D869 Sarcoidosis, unspecified: Secondary | ICD-10-CM | POA: Diagnosis not present

## 2023-09-17 LAB — PULMONARY FUNCTION TEST
DL/VA % pred: 112 %
DL/VA: 5.03 ml/min/mmHg/L
DLCO unc % pred: 51 %
DLCO unc: 14.58 ml/min/mmHg
FEF 25-75 Post: 1.48 L/s
FEF 25-75 Pre: 0.9 L/s
FEF2575-%Change-Post: 64 %
FEF2575-%Pred-Post: 43 %
FEF2575-%Pred-Pre: 26 %
FEV1-%Change-Post: 10 %
FEV1-%Pred-Post: 38 %
FEV1-%Pred-Pre: 35 %
FEV1-Post: 1.48 L
FEV1-Pre: 1.34 L
FEV1FVC-%Change-Post: 5 %
FEV1FVC-%Pred-Pre: 94 %
FEV6-%Change-Post: 4 %
FEV6-%Pred-Post: 40 %
FEV6-%Pred-Pre: 38 %
FEV6-Post: 1.9 L
FEV6-Pre: 1.81 L
FEV6FVC-%Change-Post: 0 %
FEV6FVC-%Pred-Post: 103 %
FEV6FVC-%Pred-Pre: 102 %
FVC-%Change-Post: 4 %
FVC-%Pred-Post: 38 %
FVC-%Pred-Pre: 37 %
FVC-Post: 1.9 L
FVC-Pre: 1.82 L
Post FEV1/FVC ratio: 78 %
Post FEV6/FVC ratio: 100 %
Pre FEV1/FVC ratio: 74 %
Pre FEV6/FVC Ratio: 99 %
RV % pred: 65 %
RV: 1.29 L
TLC % pred: 43 %
TLC: 2.95 L

## 2023-09-17 NOTE — Progress Notes (Signed)
 Full pft performed today.

## 2023-09-17 NOTE — Patient Instructions (Signed)
 Full pft performed today.

## 2023-09-18 ENCOUNTER — Telehealth: Payer: Self-pay | Admitting: Pharmacist

## 2023-09-18 MED ORDER — SEMAGLUTIDE (1 MG/DOSE) 4 MG/3ML ~~LOC~~ SOPN
1.0000 mg | PEN_INJECTOR | SUBCUTANEOUS | 0 refills | Status: DC
Start: 2023-09-18 — End: 2023-10-29

## 2023-09-18 NOTE — Telephone Encounter (Signed)
 I spoke with patient.  He is doing well on Ozempic  0.5 mg weekly.  He has an upcoming surgery at the end of the month and says he will have to come off his Ozempic .  I confirmed that he will need to hold 1 week prior but should be able to resume after surgery.  He is interested in increasing to 1 mg.  He asked that the prescription be sent to [facility].  Follow-up in a month.

## 2023-09-20 ENCOUNTER — Telehealth: Payer: Self-pay | Admitting: Emergency Medicine

## 2023-09-20 ENCOUNTER — Ambulatory Visit (INDEPENDENT_AMBULATORY_CARE_PROVIDER_SITE_OTHER): Admitting: Emergency Medicine

## 2023-09-20 ENCOUNTER — Encounter: Payer: Self-pay | Admitting: Emergency Medicine

## 2023-09-20 VITALS — BP 130/88 | HR 95 | Temp 97.8°F | Ht 68.0 in | Wt 240.6 lb

## 2023-09-20 DIAGNOSIS — D869 Sarcoidosis, unspecified: Secondary | ICD-10-CM | POA: Diagnosis not present

## 2023-09-20 DIAGNOSIS — Z01818 Encounter for other preprocedural examination: Secondary | ICD-10-CM | POA: Insufficient documentation

## 2023-09-20 DIAGNOSIS — J4520 Mild intermittent asthma, uncomplicated: Secondary | ICD-10-CM

## 2023-09-20 NOTE — Assessment & Plan Note (Signed)
 Your sarcoidosis and your associated COPD put you at high risk for general anesthesia which could include longer mechanical ventilation, longer hospitalization or other complications.  That said your studies and your overall function are stable and there is no absolute contraindication to proceeding with your cervical spine surgery if the benefits outweigh these risks.  Certainly you need to remain on your inhaled medications in the perioperative period.

## 2023-09-20 NOTE — Assessment & Plan Note (Signed)
 Please continue Breztri  2 puffs twice a day.  Rinse and gargle after using. Keep your albuterol  available use 2 puffs when needed for shortness of breath, chest tightness, wheezing.

## 2023-09-20 NOTE — Telephone Encounter (Signed)
 Fax received from Dr. Beuford with [facility] to perform a anterior cervical decompression fusion  on patient.  Patient needs surgery clearance. Surgery is 10/03/23. Patient was seen on 09/20/23. Office protocol is a risk assessment can be sent to surgeon if patient has been seen in 60 days or less.   Risk assessment from today's visit was faxed to [facility].

## 2023-09-20 NOTE — Patient Instructions (Signed)
 We reviewed your pulmonary function testing today. Your sarcoidosis and your associated COPD put you at high risk for general anesthesia which could include longer mechanical ventilation, longer hospitalization or other complications.  That said your studies and your overall function are stable and there is no absolute contraindication to proceeding with your cervical spine surgery if the benefits outweigh these risks.  Certainly you need to remain on your inhaled medications in the perioperative period.  Please continue Breztri  2 puffs twice a day.  Rinse and gargle after using. Keep your albuterol  available use 2 puffs when needed for shortness of breath, chest tightness, wheezing. Continue your prednisone , methotrexate  and prophylactic Bactrim  as you have been taking them. Follow with Dr Shelah in 6 months or sooner if you have any problems

## 2023-09-20 NOTE — Progress Notes (Signed)
   Subjective:    Patient ID: Nathan [name], male    DOB: 1970/05/05, 53 y.o.   MRN: [id number]  HPI  ROV 09/20/2023 --follow-up visit for 53 year old gentleman with a history of former tobacco, sarcoidosis with lung and skin involvement.  He has had a reassuring cardiac PET scan.  He has associated significant obstructive disease/COPD and is managed on methotrexate , 5 mg of prednisone , prophylactic Bactrim .  He is also on Breztri , Zyrtec  and uses albuterol  as needed.  He reports that his breathing has been stable. He can walk on flat ground fairly well, able to shop. Difficulty w stairs or hills. He is preparing for a C-spine sgy w Dr Beuford  Pulmonary function testing done 8//25 and reviewed by me show severe mixed obstruction and restriction with a borderline bronchodilator response, restricted lung volumes, decreased diffusion capacity that corrects to the normal range when adjusted for alveolar volume.   Review of Systems As per HPI     Objective:   Physical Exam Vitals:   09/20/23 1542  BP: 130/88  Pulse: 95  Temp: 97.8 F (36.6 C)  TempSrc: Temporal  SpO2: 97%  Weight: 240 lb 9.6 oz (109.1 kg)  Height: 5' 8 (1.727 m)   Gen: Pleasant, well-nourished, in no distress,  normal affect  ENT: No lesions, the previously noted paranasal and periorbital skin changes have resolved  Neck: No JVD, no stridor  Lungs: No use of accessory muscles, no crackles or wheezing on normal respiration, no wheeze on forced expiration  Cardiovascular: RRR, heart sounds normal, no murmur or gallops, no peripheral edema  Musculoskeletal: No deformities, no cyanosis or clubbing  Neuro: alert, awake, non focal  Skin: No evidence of rash on the face or upper extremities      Assessment & Plan:  Sarcoidosis We reviewed your pulmonary function testing today. Continue your prednisone , methotrexate  and prophylactic Bactrim  as you have been taking them. Follow with Dr Shelah in 6 months or  sooner if you have any problems  Asthma Please continue Breztri  2 puffs twice a day.  Rinse and gargle after using. Keep your albuterol  available use 2 puffs when needed for shortness of breath, chest tightness, wheezing.  Preoperative examination Your sarcoidosis and your associated COPD put you at high risk for general anesthesia which could include longer mechanical ventilation, longer hospitalization or other complications.  That said your studies and your overall function are stable and there is no absolute contraindication to proceeding with your cervical spine surgery if the benefits outweigh these risks.  Certainly you need to remain on your inhaled medications in the perioperative period.      Lamar Shelah, MD, PhD 09/20/2023, 4:14 PM [facility] [phone] or if no answer [phone]

## 2023-09-20 NOTE — Assessment & Plan Note (Signed)
 We reviewed your pulmonary function testing today. Continue your prednisone , methotrexate  and prophylactic Bactrim  as you have been taking them. Follow with Dr Shelah in 6 months or sooner if you have any problems

## 2023-09-21 ENCOUNTER — Telehealth: Payer: Self-pay

## 2023-09-21 NOTE — Telephone Encounter (Signed)
   Name: Nathan [name]  DOB: 1970/12/08  MRN: [id number]  Primary Cardiologist: Arun K Thukkani, MD   Preoperative team, please contact this patient and set up a phone call appointment for further preoperative risk assessment. Please obtain consent and complete medication review. Thank you for your help.  I confirm that guidance regarding antiplatelet and oral anticoagulation therapy has been completed and, if necessary, noted below.  His aspirin  may be held for 7 days prior to his procedure.  Please resume as soon as hemostasis is achieved.  I also confirmed the patient resides in the state of [place] . As per [facility] Medical Board telemedicine laws, the patient must reside in the state in which the provider is licensed.   Josefa CHRISTELLA Beauvais, NP 09/21/2023, 1:56 PM [facility]

## 2023-09-21 NOTE — Telephone Encounter (Signed)
 Appointment scheduled for Thursday, September 27, 2023 @ 10:20am Med req and consent are complete.

## 2023-09-21 NOTE — Telephone Encounter (Signed)
   Pre-operative Risk Assessment    Patient Name: Nathan [name]  DOB: 09/08/1970 MRN: [id number]   Date of last office visit: 04/25/23 LURENA RED, MD Date of next office visit: NONE   Request for Surgical Clearance    Procedure:  ANTERIOR CERVICAL DECOMPRESSION FUSION CERVICAL 4-5, 5-6 WITH INSTRUMENTATION AND ALLOGRAFT  Date of Surgery:  Clearance 10/03/23                                Surgeon:  ONEIL PRIESTLY, MD Surgeon's Group or Practice Name:  [facility] Phone number:  [phone] Fax number:  [phone]  ATTN: DONNA MCCAIN   Type of Clearance Requested:   - Medical  - Pharmacy:  Hold Aspirin      Type of Anesthesia:  General    Additional requests/questions:    SignedLucie DELENA Ku   09/21/2023, 11:46 AM

## 2023-09-21 NOTE — Telephone Encounter (Signed)
  Patient Consent for Virtual Visit         Nathan [name] has provided verbal consent on 09/21/2023 for a virtual visit (video or telephone).  Appointment scheduled for Thursday, September 27, 2023 @ 10:20am Med req and consent are complete.   CONSENT FOR VIRTUAL VISIT FOR:  Nathan [name]  By participating in this virtual visit I agree to the following:  I hereby voluntarily request, consent and authorize [facility] and its employed or contracted physicians, physician assistants, nurse practitioners or other licensed health care professionals (the Practitioner), to provide me with telemedicine health care services (the "Services) as deemed necessary by the treating Practitioner. I acknowledge and consent to receive the Services by the Practitioner via telemedicine. I understand that the telemedicine visit will involve communicating with the Practitioner through live audiovisual communication technology and the disclosure of certain medical information by electronic transmission. I acknowledge that I have been given the opportunity to request an in-person assessment or other available alternative prior to the telemedicine visit and am voluntarily participating in the telemedicine visit.  I understand that I have the right to withhold or withdraw my consent to the use of telemedicine in the course of my care at any time, without affecting my right to future care or treatment, and that the Practitioner or I may terminate the telemedicine visit at any time. I understand that I have the right to inspect all information obtained and/or recorded in the course of the telemedicine visit and may receive copies of available information for a reasonable fee.  I understand that some of the potential risks of receiving the Services via telemedicine include:  Delay or interruption in medical evaluation due to technological equipment failure or disruption; Information transmitted may not be  sufficient (e.g. poor resolution of images) to allow for appropriate medical decision making by the Practitioner; and/or  In rare instances, security protocols could fail, causing a breach of personal health information.  Furthermore, I acknowledge that it is my responsibility to provide information about my medical history, conditions and care that is complete and accurate to the best of my ability. I acknowledge that Practitioner's advice, recommendations, and/or decision may be based on factors not within their control, such as incomplete or inaccurate data provided by me or distortions of diagnostic images or specimens that may result from electronic transmissions. I understand that the practice of medicine is not an exact science and that Practitioner makes no warranties or guarantees regarding treatment outcomes. I acknowledge that a copy of this consent can be made available to me via my patient portal [facility] MyChart), or I can request a printed copy by calling the office of [facility].    I understand that my insurance will be billed for this visit.   I have read or had this consent read to me. I understand the contents of this consent, which adequately explains the benefits and risks of the Services being provided via telemedicine.  I have been provided ample opportunity to ask questions regarding this consent and the Services and have had my questions answered to my satisfaction. I give my informed consent for the services to be provided through the use of telemedicine in my medical care

## 2023-09-26 NOTE — Progress Notes (Signed)
 Surgical Instructions   Your procedure is scheduled on Wednesday, August 20th. Report to [facility] Main Entrance A at 12:00 P.M., then check in with the Admitting office. Any questions or running late day of surgery: call [phone]  Questions prior to your surgery date: call [phone], Monday-Friday, 8am-4pm. If you experience any cold or flu symptoms such as cough, fever, chills, shortness of breath, etc. between now and your scheduled surgery, please notify us  at the above number.     Remember:  Do not eat after midnight the night before your surgery  You may drink clear liquids until 12:00 (noon) the morning of your surgery.   Clear liquids allowed are: Water, Non-Citrus Juices (without pulp), Carbonated Beverages, Clear Tea (no milk, honey, etc.), Black Coffee Only (NO MILK, CREAM OR POWDERED CREAMER of any kind), and Gatorade.  Patient Instructions  The night before surgery:  No food after midnight. ONLY clear liquids after midnight  The day of surgery (if you have diabetes): Drink ONE (1) 12 oz G2 given to you in your pre admission testing appointment by 12:00 the morning of surgery. Drink in one sitting. Do not sip.  This drink was given to you during your hospital pre-op appointment visit.  Nothing else to drink after completing the 12 oz bottle of G2.         If you have questions, please contact your surgeon's office.    Take these medicines the morning of surgery with A SIP OF WATER  budeson-glycopyrrolate-formoterol  (BREZTRI  AEROSPHERE) inhaler  cetirizine  (ZYRTEC )  cyclobenzaprine  (FLEXERIL )  fluticasone  (FLONASE )  omeprazole (PRILOSEC)  predniSONE  (DELTASONE )  pregabalin  (LYRICA )    May take these medicines IF NEEDED: albuterol  (VENTOLIN  HFA) - bring with you on day of surgery  HYDROcodone -acetaminophen  [facility])    Per your physician's request, HOLD your Aspirin  for 7 day's prior to surgery.     One week prior to surgery, STOP taking any Aleve ,  Naproxen , Ibuprofen , Motrin , Advil , Goody's, BC's, all herbal medications, fish oil, and non-prescription vitamins.          WHAT DO I DO ABOUT MY DIABETES MEDICATION?   Do not take your Metformin (GLUCOPHAGE-XR) the morning of surgery. Last dose should be taken on Tuesday, August 19th.  HOLD your Semaglutide  for 7 days prior to your surgery.  Last dose should be taken on or before Tuesday,  August 12th.      HOLD your sitagliptin (JANUVIA) for 1 day prior to your surgery.  Last dose should be taken on Tuesday, August 19th.    HOW TO MANAGE YOUR DIABETES BEFORE AND AFTER SURGERY  Why is it important to control my blood sugar before and after surgery? Improving blood sugar levels before and after surgery helps healing and can limit problems. A way of improving blood sugar control is eating a healthy diet by:  Eating less sugar and carbohydrates  Increasing activity/exercise  Talking with your doctor about reaching your blood sugar goals High blood sugars (greater than 180 mg/dL) can raise your risk of infections and slow your recovery, so you will need to focus on controlling your diabetes during the weeks before surgery. Make sure that the doctor who takes care of your diabetes knows about your planned surgery including the date and location.  How do I manage my blood sugar before surgery? Check your blood sugar at least 4 times a day, starting 2 days before surgery, to make sure that the level is not too high or low.  Check your  blood sugar the morning of your surgery when you wake up and every 2 hours until you get to the Short Stay unit.  If your blood sugar is less than 70 mg/dL, you will need to treat for low blood sugar: Do not take insulin . Treat a low blood sugar (less than 70 mg/dL) with  cup of clear juice (cranberry or apple), 4 glucose tablets, OR glucose gel. Recheck blood sugar in 15 minutes after treatment (to make sure it is greater than 70 mg/dL). If your blood  sugar is not greater than 70 mg/dL on recheck, call [phone] for further instructions. Report your blood sugar to the short stay nurse when you get to Short Stay.  If you are admitted to the hospital after surgery: Your blood sugar will be checked by the staff and you will probably be given insulin  after surgery (instead of oral diabetes medicines) to make sure you have good blood sugar levels. The goal for blood sugar control after surgery is 80-180 mg/dL.              Do NOT Smoke (Tobacco/Vaping) for 24 hours prior to your procedure.  If you use a CPAP at night, you may bring your mask/headgear for your overnight stay.   You will be asked to remove any contacts, glasses, piercing's, hearing aid's, dentures/partials prior to surgery. Please bring cases for these items if needed.    Patients discharged the day of surgery will not be allowed to drive home, and someone needs to stay with them for 24 hours.  SURGICAL WAITING ROOM VISITATION Patients may have no more than 2 support people in the waiting area - these visitors may rotate.   Pre-op nurse will coordinate an appropriate time for 1 ADULT support person, who may not rotate, to accompany patient in pre-op.  Children under the age of 31 must have an adult with them who is not the patient and must remain in the main waiting area with an adult.  If the patient needs to stay at the hospital during part of their recovery, the visitor guidelines for inpatient rooms apply.  Please refer to the [facility] website for the visitor guidelines for any additional information.   If you received a COVID test during your pre-op visit  it is requested that you wear a mask when out in public, stay away from anyone that may not be feeling well and notify your surgeon if you develop symptoms. If you have been in contact with anyone that has tested positive in the last 10 days please notify you surgeon.      Pre-operative 5 CHG Bathing  Instructions   You can play a key role in reducing the risk of infection after surgery. Your skin needs to be as free of germs as possible. You can reduce the number of germs on your skin by washing with CHG (chlorhexidine  gluconate) soap before surgery. CHG is an antiseptic soap that kills germs and continues to kill germs even after washing.   DO NOT use if you have an allergy  to chlorhexidine /CHG or antibacterial soaps. If your skin becomes reddened or irritated, stop using the CHG and notify one of our RNs at [phone].   Please shower with the CHG soap starting 4 days before surgery using the following schedule:     Please keep in mind the following:  DO NOT shave, including legs and underarms, starting the day of your first shower.   You may shave your face at  any point before/day of surgery.  Place clean sheets on your bed the day you start using CHG soap. Use a clean washcloth (not used since being washed) for each shower. DO NOT sleep with pets once you start using the CHG.   CHG Shower Instructions:  Wash your face and private area with normal soap. If you choose to wash your hair, wash first with your normal shampoo.  After you use shampoo/soap, rinse your hair and body thoroughly to remove shampoo/soap residue.  Turn the water OFF and apply about 3 tablespoons (45 ml) of CHG soap to a CLEAN washcloth.  Apply CHG soap ONLY FROM YOUR NECK DOWN TO YOUR TOES (washing for 3-5 minutes)  DO NOT use CHG soap on face, private areas, open wounds, or sores.  Pay special attention to the area where your surgery is being performed.  If you are having back surgery, having someone wash your back for you may be helpful. Wait 2 minutes after CHG soap is applied, then you may rinse off the CHG soap.  Pat dry with a clean towel  Put on clean clothes/pajamas   If you choose to wear lotion, please use ONLY the CHG-compatible lotions that are listed below.  Additional instructions for the day  of surgery: DO NOT APPLY any lotions, deodorants, cologne, or perfumes.   Do not bring valuables to the hospital. [facility] is not responsible for any belongings/valuables. Do not wear nail polish, gel polish, artificial nails, or any other type of covering on natural nails (fingers and toes) Do not wear jewelry or makeup Put on clean/comfortable clothes.  Please brush your teeth.  Ask your nurse before applying any prescription medications to the skin.     CHG Compatible Lotions   Aveeno Moisturizing lotion  Cetaphil Moisturizing Cream  Cetaphil Moisturizing Lotion  Clairol Herbal Essence Moisturizing Lotion, Dry Skin  Clairol Herbal Essence Moisturizing Lotion, Extra Dry Skin  Clairol Herbal Essence Moisturizing Lotion, Normal Skin  Curel Age Defying Therapeutic Moisturizing Lotion with Alpha Hydroxy  Curel Extreme Care Body Lotion  Curel Soothing Hands Moisturizing Hand Lotion  Curel Therapeutic Moisturizing Cream, Fragrance-Free  Curel Therapeutic Moisturizing Lotion, Fragrance-Free  Curel Therapeutic Moisturizing Lotion, Original Formula  Eucerin Daily Replenishing Lotion  Eucerin Dry Skin Therapy Plus Alpha Hydroxy Crme  Eucerin Dry Skin Therapy Plus Alpha Hydroxy Lotion  Eucerin Original Crme  Eucerin Original Lotion  Eucerin Plus Crme Eucerin Plus Lotion  Eucerin TriLipid Replenishing Lotion  Keri Anti-Bacterial Hand Lotion  Keri Deep Conditioning Original Lotion Dry Skin Formula Softly Scented  Keri Deep Conditioning Original Lotion, Fragrance Free Sensitive Skin Formula  Keri Lotion Fast Absorbing Fragrance Free Sensitive Skin Formula  Keri Lotion Fast Absorbing Softly Scented Dry Skin Formula  Keri Original Lotion  Keri Skin Renewal Lotion Keri Silky Smooth Lotion  Keri Silky Smooth Sensitive Skin Lotion  Nivea Body Creamy Conditioning Oil  Nivea Body Extra Enriched Lotion  Nivea Body Original Lotion  Nivea Body Sheer Moisturizing Lotion Nivea Crme  Nivea  Skin Firming Lotion  NutraDerm 30 Skin Lotion  NutraDerm Skin Lotion  NutraDerm Therapeutic Skin Cream  NutraDerm Therapeutic Skin Lotion  ProShield Protective Hand Cream  Provon moisturizing lotion  Please read over the following fact sheets that you were given.

## 2023-09-26 NOTE — Progress Notes (Deleted)
 Office Visit Note  Patient: Sascha Baugher             Date of Birth: 09-02-1970           MRN: [id number]             PCP: [facility] Referring: [facility] Visit Date: 10/09/2023   Subjective:  No chief complaint on file.   History of Present Illness: Blayde Bacigalupi is a 53 y.o. male here for follow up for sarcoidosis with pulmonary, articular, and cutaneous disease involvement currently on methotrexate  20 mg p.o. weekly folic acid  1 mg daily and prednisone  5 mg daily.     Previous HPI 06/18/2023 Sara Selvidge is a 53 y.o. male here for follow up  for sarcoidosis with pulmonary, articular, and cutaneous disease involvement currently on methotrexate  20 mg p.o. weekly folic acid  1 mg daily and prednisone  5 mg daily.     His last pulmonary function test showed significant obstruction, and he is scheduled for another test on August 4th. He describes difficulty with air movement due to narrowed or reactive airways, likening it to a 'big bagpipe' with narrowed pipes. He uses inhaler treatments, which are more effective for his obstructive problem. He experiences increased shortness of breath with exertion, such as climbing stairs or walking uphill, and becomes winded more easily than before. His wife noticed a mention of COPD in his medical chart, and he inquires about this diagnosis. He has a history of sarcoidosis, which typically causes restrictive lung problems, but his current issue is more obstructive in nature. He is not currently requiring oxygen therapy or CPAP at night.   He reports ongoing neck pain and is scheduled for another injection on the 12th, though he is unsure of the type of injection. He previously had an x-ray in March due to a pinched nerve, which was persistent and led him to the emergency room.   He is taking Bactrim  on Monday, Wednesday, and Friday due to his medication regimen. He recently had teeth extracted and  was on antibiotics for that procedure. He is awaiting fitting for dentures and a partial.   He has been experiencing balance issues and is scheduled to see an ENT specialist for evaluation of possible vertigo. He was prescribed medication to chew as needed for this issue, but he does not find it effective.   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 His ankles feel tight and sometimes painful, especially in the morning.         Previous HPI 03/21/2023 Keeon Zurn is a 54 y.o. male here for follow up for sarcoidosis with pulmonary, articular, and cutaneous disease involvement currently on methotrexate  20 mg p.o. weekly folic acid  1 mg daily and prednisone  5 mg daily.     He experiences ongoing neck and back pain, which he attributes to a pinched nerve. The pain radiates down his arm, affecting his ability to sit or stand for prolonged periods. He has undergone multiple procedures, most recently had spinal epidural injection about a month ago, which provided temporary relief for two days. This was after recent updated MRI showing what sounds like multilevel degenerative disease. He continues to take methotrexate  every Wednesday night and prednisone  daily. No leg swelling or glandular swelling. Pain is present in the neck and shoulders, with radiation down the  arm.   He has a history of sarcoidosis, currently managed with methotrexate  and prednisone . The treatment has been ongoing, and he has experienced some improvement in joint symptoms with low-dose prednisone . He recalls a previous treatment with high-dose prednisone , which led to weight gain.   He reports a skin rash that has been intermittently dry, for which he has been using a prescribed cream. He has not noticed any other rashes besides the one mentioned.   He is concerned about his ability to work due to his symptoms  and has filed for disability. He mentions having undergone a functional capacity exam to assess his physical limitations. He recalls an episode from December where he visited the emergency room due to severe pain from a pinched nerve, during which he was prescribed a medrol  dose pack and muscle relaer. He has not been sick recently.     12/06/2022 Carlin Mithran Strike is a 53 y.o. male here for follow up for sarcoidosis with pulmonary, articular, and cutaneous disease involvement currently on methotrexate  20 mg p.o. weekly folic acid  1 mg daily and prednisone  5 mg daily.  He has had persistent pain in his neck and some shooting radiating type pain more down the right arm than down the left arm.  He is experiencing stiffness and numbness in the hands more after prolonged use especially holding a tight grip.  A little bit of pain around the MCP joints of the hand otherwise more just trouble gripping than actual pain.  He had an MRI of the neck yesterday that was not yet read.  Has a follow-up planned with pain management for this.  He had recent labs checked that showed normal renal function.  Saw dermatology and was started with topical medication appears to be triamcinolone 0.1% for use on the ongoing facial rashes.  So far these have faded in color a little bit but not gone away.  He had follow-up with Dr. Shelah no concern of worsening pulmonary status.  He did have to take antibiotics for dental abscess suspected at the right upper molars.   08/29/2022 Carlin Curtis Cain is a 53 y.o. male here for follow up for sarcoidosis with pulmonary, articular, and cutaneous disease involvement currently on methotrexate  20 mg p.o. weekly folic acid  1 mg daily and prednisone  5 mg daily.  Skin rashes are doing well since her last visit.  [facility] for ongoing neck and upper back pain with trial of steroid injection to the neck with partial benefit.  He is also been recommended if  effectiveness is low for possible trigger point injection at multiple sites in the neck and upper back.  He had increase in cholesterol medication with his PCP onto a atorvastatin  and ezetimibe noticing some increased muscle pain and cramping so discontinued the ezetimibe for about 1 week.  At that visit on July 8 had an elevated CK level of 662.  Outside of these areas joint pain is doing well and not noticed any significant swelling in his arms or legs.  Still has some persistent cough swallowing difficulty and shortness of breath with exertion plans for upcoming repeat CT scan and pulmonology follow-up.  He is also been referred for seeing gastroenterology based on previous findings of laryngeal pharyngeal reflux disease.   05/30/22 Carlin Kemari Narez is a 53 y.o. male here for follow up for sarcoidosis with pulmonary articular and cutaneous disease involvement currently on methotrexate  20 mg p.o. weekly folic acid  1 mg  daily and prednisone  5 mg daily.  Rash is somewhat improved more since her last visit.  Follow-up with pulmonology clinic not clear whether there is active disease plan for follow-up this summer with repeat lung study to assess this.  He saw the ENT doctor noticed excessive mucus on exam suspicious for reflux for his upper airway cough syndrome issue.  He saw spine specialist for his neck and upper back pain initially started low-dose Lyrica  and tramadol  while getting outside records and workup and has follow-up scheduled later this week.   02/28/22 Emilo Krishawn Vanderweele is a 53 y.o. male here for follow up for sarcoidosis with pulmonary and cutaneous involvement on methotrexate  15 mg p.o. weekly and folic acid  1 mg daily.  Since her last visit he had PET scan that looked good no evidence of cardiac sarcoidosis activity.  CT scan in October with stable lymphadenopathy and bandlike and nodular opacities in the lungs, although the associated nondiagnostic CT in December was consistent with  hypermetabolic mediastinal and hilar lymphadenopathy and bilateral pulmonary parenchymal opacities consistent with inflammatory process and active granulomatous disease.  He is tolerating the methotrexate  without any difficulty.  Still been noticing some ongoing skin rash with minimally raised hyperpigmented spots along the medial border of the eyes.  He has not felt a significant improvement with his neck pain on the medication.  Still feels restriction in his range of motion especially looking towards the right and gets pain painful experiences towards the left side.   12/19/21 Juwann Hazel Wrinkle is a 53 y.o. male here for follow up for joint pains with history of pulmonary and cutaneous sacoidosis now on methotrexate  15 mg PO weekly and folic acid  1 mg daily. He has not noticed any problems with the medication but also no improvement in symptoms so far. Still pain worst in the neck and upper back with radiation into back and with headaches. Asthma symptoms doing somewhat worse this year. He had cardiac MRI but is now recommended for cardiac PET scan to evaluate but not scheduled yet.   Previous HPI 11/15/2021 Eusebio Blazejewski is a 53 y.o. male here for joint pains with history of pulmonary and cutaneous sacoidosis.  His worst affected area is pretty constant pain in the base of the neck area frequently with radiation up the neck and posterior headaches associated with this.  He has had problems going on for years but seems to be progressively getting somewhat worse.  He does get some pain in extremities elsewhere but by far the most limiting for him is in the base of the neck.  He has tried multiple NSAIDs including meloxicam  and Celebrex is also tried muscle relaxant medications none of which were very significantly beneficial.  He is tried physical therapy completing courses and had local injections that did not greatly relieve symptoms.  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  He had  most recent neck MRI in 2022 based on findings had discussed surgery options but is looking to avoid this due to concern about complications or long-term needing additional revision. Original diagnosis of sarcoidosis was based on pulmonary imaging finding and treated with very prolonged prednisone  taper.  Initially only had respiratory symptoms with some mild dyspnea and coughing that improved with the steroid treatments.  More recently this year he had an episode of similar symptoms coming back and was treated with a round of steroids from pulmonology clinic in noticed a increase in blood glucose up to 400s.  He  is also developed some new skin rashes on the face previously had involvement on extremities but no facial rashes with original symptoms.  He is prescribed topical medication by Dr. Livingston but has not seen any difference so far.    No Rheumatology ROS completed.   PMFS History:  Patient Active Problem List   Diagnosis Date Noted   Preoperative examination 09/20/2023   Type 2 diabetes mellitus without complication, without long-term current use of insulin  (HCC) 06/22/2023   Cervical spine pain 08/29/2022   Myalgia 08/29/2022   High risk medication use 11/15/2021   HLD (hyperlipidemia) 09/13/2021   Asthma 11/10/2020   COVID-19 virus infection 10/21/2019   Shoulder pain, left 10/23/2016   Other chest pain 08/22/2014   Upper airway cough syndrome 07/23/2014   Sarcoidosis 03/06/2011    Past Medical History:  Diagnosis Date   Acid reflux disease    Arthritis    Asthma    'I think I might have asthma   Chronic back pain    Dyspnea    chronic   Hyperlipidemia    Hypertension    Sarcoidosis     Family History  Problem Relation Age of Onset   Hypertension Mother    Diabetes Mother    Heart attack Father    Diabetes Father    Hypertension Father    Gout Father    Colon polyps Sister    Heart disease Paternal Uncle    Prostate cancer Paternal Uncle    Heart disease  Maternal Grandfather    Anemia Daughter    Anemia Daughter    Colon cancer Neg Hx    Esophageal cancer Neg Hx    Rectal cancer Neg Hx    Stomach cancer Neg Hx    Past Surgical History:  Procedure Laterality Date   NO PAST SURGERIES     Social History   Social History Narrative   Not on file   Immunization History  Administered Date(s) Administered   Dtap, Unspecified 11/09/1972, 01/07/1973, 03/01/1973, 03/24/1974, 12/06/1977   Influenza,inj,Quad PF,6+ Mos 11/10/2020, 02/16/2022   Measles 09/23/1973   Mumps 03/24/1974   PFIZER Comirnaty(Gray Top)Covid-19 Tri-Sucrose Vaccine 10/28/2020   PFIZER(Purple Top)SARS-COV-2 Vaccination 10/27/2020   PNEUMOCOCCAL CONJUGATE-20 02/16/2022   Polio, Unspecified 11/09/1972, 01/07/1973, 03/11/1973, 03/24/1974, 12/06/1977   Rubella 09/23/1973   Tdap 02/16/2022     Objective: Vital Signs: There were no vitals taken for this visit.   Physical Exam   Musculoskeletal Exam: ***  CDAI Exam: CDAI Score: -- Patient Global: --; Provider Global: -- Swollen: --; Tender: -- Joint Exam 10/09/2023   No joint exam has been documented for this visit   There is currently no information documented on the homunculus. Go to the Rheumatology activity and complete the homunculus joint exam.  Investigation: No additional findings.  Imaging: No results found.  Recent Labs: Lab Results  Component Value Date   WBC 5.1 06/18/2023   HGB 13.5 06/18/2023   PLT 341 06/18/2023   NA 140 06/18/2023   K 3.8 06/18/2023   CL 99 06/18/2023   CO2 33 (H) 06/18/2023   GLUCOSE 101 (H) 06/18/2023   BUN 16 06/18/2023   CREATININE 0.65 (L) 06/18/2023   BILITOT 0.6 06/18/2023   ALKPHOS 62 04/28/2023   AST 27 06/18/2023   ALT 25 06/18/2023   PROT 7.1 06/18/2023   ALBUMIN 4.5 04/28/2023   CALCIUM  9.4 06/18/2023   GFRAA >60 09/17/2019    Speciality Comments: No specialty comments available.  Procedures:  No procedures  performed Allergies: Atorvastatin ,  Rosuvastatin , Tizanidine , Zetia [ezetimibe], Oxycodone -acetaminophen , and Percocet [oxycodone -acetaminophen ]   Assessment / Plan:     Visit Diagnoses: No diagnosis found.  ***  Orders: No orders of the defined types were placed in this encounter.  No orders of the defined types were placed in this encounter.    Follow-Up Instructions: No follow-ups on file.   Vandana Haman M Madalyne Husk, CMA  Note - This record has been created using Animal nutritionist.  Chart creation errors have been sought, but may not always  have been located. Such creation errors do not reflect on  the standard of medical care.

## 2023-09-27 ENCOUNTER — Encounter (HOSPITAL_COMMUNITY): Payer: Self-pay

## 2023-09-27 ENCOUNTER — Ambulatory Visit (INDEPENDENT_AMBULATORY_CARE_PROVIDER_SITE_OTHER): Admitting: Nurse Practitioner

## 2023-09-27 ENCOUNTER — Encounter (HOSPITAL_COMMUNITY)
Admission: RE | Admit: 2023-09-27 | Discharge: 2023-09-27 | Disposition: A | Discharge status: Home / Self Care | Source: Ambulatory Visit | Attending: Orthopedic Surgery | Admitting: Orthopedic Surgery

## 2023-09-27 ENCOUNTER — Other Ambulatory Visit: Payer: Self-pay

## 2023-09-27 VITALS — BP 141/102 | HR 94 | Temp 98.2°F | Resp 17 | Ht 68.0 in | Wt 240.0 lb

## 2023-09-27 DIAGNOSIS — Z01818 Encounter for other preprocedural examination: Secondary | ICD-10-CM | POA: Diagnosis present

## 2023-09-27 DIAGNOSIS — J45909 Unspecified asthma, uncomplicated: Secondary | ICD-10-CM | POA: Insufficient documentation

## 2023-09-27 DIAGNOSIS — E785 Hyperlipidemia, unspecified: Secondary | ICD-10-CM | POA: Insufficient documentation

## 2023-09-27 DIAGNOSIS — Z01810 Encounter for preprocedural cardiovascular examination: Secondary | ICD-10-CM

## 2023-09-27 DIAGNOSIS — Z7952 Long term (current) use of systemic steroids: Secondary | ICD-10-CM | POA: Insufficient documentation

## 2023-09-27 DIAGNOSIS — I10 Essential (primary) hypertension: Secondary | ICD-10-CM | POA: Diagnosis not present

## 2023-09-27 DIAGNOSIS — Z87891 Personal history of nicotine dependence: Secondary | ICD-10-CM | POA: Diagnosis not present

## 2023-09-27 DIAGNOSIS — R0609 Other forms of dyspnea: Secondary | ICD-10-CM | POA: Insufficient documentation

## 2023-09-27 DIAGNOSIS — D860 Sarcoidosis of lung: Secondary | ICD-10-CM | POA: Insufficient documentation

## 2023-09-27 DIAGNOSIS — Z7984 Long term (current) use of oral hypoglycemic drugs: Secondary | ICD-10-CM | POA: Insufficient documentation

## 2023-09-27 DIAGNOSIS — Z79631 Long term (current) use of antimetabolite agent: Secondary | ICD-10-CM | POA: Insufficient documentation

## 2023-09-27 DIAGNOSIS — M4802 Spinal stenosis, cervical region: Secondary | ICD-10-CM | POA: Insufficient documentation

## 2023-09-27 DIAGNOSIS — I700 Atherosclerosis of aorta: Secondary | ICD-10-CM | POA: Insufficient documentation

## 2023-09-27 DIAGNOSIS — E119 Type 2 diabetes mellitus without complications: Secondary | ICD-10-CM | POA: Diagnosis not present

## 2023-09-27 DIAGNOSIS — M4712 Other spondylosis with myelopathy, cervical region: Secondary | ICD-10-CM | POA: Insufficient documentation

## 2023-09-27 HISTORY — DX: Type 2 diabetes mellitus without complications: E11.9

## 2023-09-27 LAB — BASIC METABOLIC PANEL WITH GFR
Anion gap: 11 (ref 5–15)
BUN: 9 mg/dL (ref 6–20)
CO2: 27 mmol/L (ref 22–32)
Calcium: 8.8 mg/dL — ABNORMAL LOW (ref 8.9–10.3)
Chloride: 99 mmol/L (ref 98–111)
Creatinine, Ser: 0.74 mg/dL (ref 0.61–1.24)
GFR, Estimated: >60 mL/min (ref >60)
Glucose, Bld: 101 mg/dL — ABNORMAL HIGH (ref 70–99)
Potassium: 3.7 mmol/L (ref 3.5–5.1)
Sodium: 137 mmol/L (ref 135–145)

## 2023-09-27 LAB — GLUCOSE, CAPILLARY: Glucose-Capillary: 132 mg/dL — ABNORMAL HIGH (ref 70–99)

## 2023-09-27 LAB — SURGICAL PCR SCREEN
MRSA, PCR: NEGATIVE
Staphylococcus aureus: NEGATIVE

## 2023-09-27 NOTE — Progress Notes (Signed)
 PCP - Dr. Starleen Monte Cardiologist - Dr. Lurena Red LOV 04-25-23, telehealth visit for clearance 09/27/23 Pulmonologist - Dr. Lamar Chris - LOV 09/20/23 with clearance. F/U in 6 months   PPM/ICD - Denies Device Orders - n/a Rep Notified - n/a  Chest x-ray - Denies EKG - 04-28-23 Stress Test - Denies ECHO - 03-10-21 Cardiac Cath - Denies  Sleep Study - Denies CPAP - n/a  Fasting Blood Sugar - per patient he has the stuff to check his blood sugar but does not check his sugar  Last dose of GLP1 agonist- Semaglutide  GLP1 instructions: LD on Tuesday 09/25/23   Blood Thinner Instructions: Aspirin  Instructions: Hold for 7 days LD Tuesday, 09/25/23  ERAS Protcol - Clears until 1200 PRE-SURGERY Ensure or G2 - G2  COVID TEST- N/A   Anesthesia review: Yes, HTN, DM, Sarcoidosis.   Patient denies shortness of breath, fever, cough and chest pain at PAT appointment. Patient denies any respiratory issues at this time.    All instructions explained to the patient, with a verbal understanding of the material. Patient agrees to go over the instructions while at home for a better understanding. Patient also instructed to self quarantine after being tested for COVID-19. The opportunity to ask questions was provided.

## 2023-09-27 NOTE — Progress Notes (Signed)
 RN received call from lab that the CBC will need to be redrawn on day of surgery.

## 2023-09-27 NOTE — Progress Notes (Signed)
 Virtual Visit via Telephone Note   Because of Eiden Bagot co-morbid illnesses, he is at least at moderate risk for complications without adequate follow up.  This format is felt to be most appropriate for this patient at this time.  Due to technical limitations with video connection Web designer), today's appointment will be conducted as an audio only telehealth visit, and Narciso Stoutenburg verbally agreed to proceed in this manner.   All issues noted in this document were discussed and addressed.  No physical exam could be performed with this format.  Evaluation Performed:  Preoperative cardiovascular risk assessment _____________   Date:  09/27/2023   Patient ID:  Cortlan Dolin, DOB 02-11-1971, MRN [id number] Patient Location:  Home Provider location:   Office  Primary Care Provider:  [facility] Primary Cardiologist:  Arun K Thukkani, MD  Chief Complaint / Patient Profile   53 y.o. y/o male with a h/o aortic atherosclerosis, elevated LP(a), hypertension, hyperlipidemia, chronic dyspnea, type 2 diabetes, and sarcoidosis who is pending ANTERIOR CERVICAL DECOMPRESSION FUSION CERVICAL 4-5, 5-6 WITH INSTRUMENTATION AND ALLOGRAFT on 10/03/2023 with Dr. Oneil Priestly of [facility] and presents today for telephonic preoperative cardiovascular risk assessment.  History of Present Illness    Glendon Dunwoody is a 53 y.o. male who presents via audio/video conferencing for a telehealth visit today.  Pt was last seen in cardiology clinic on 04/25/2023 by Dr. Wendel. At that time Dev Dhondt was doing well.  The patient is now pending procedure as outlined above. Since his last visit, he has done well from a cardiac standpoint.  He has stable chronic dyspnea on exertion in the setting of sarcoidosis, followed by pulmonology.  He denies chest pain, palpitations, pnd, orthopnea, n, v, dizziness, syncope, edema, weight gain, or early satiety.  All other systems reviewed and are otherwise negative except as noted above.   Past Medical History    Past Medical History:  Diagnosis Date   Acid reflux disease    Arthritis    Asthma    'I think I might have asthma   Chronic back pain    Dyspnea    chronic   Hyperlipidemia    Hypertension    Sarcoidosis    Past Surgical History:  Procedure Laterality Date   NO PAST SURGERIES      Allergies  Allergies  Allergen Reactions   Atorvastatin      Hand cramping on 10-20mg  daily   Rosuvastatin      Hand cramping on 5mg  daily   Tizanidine  Itching   Zetia [Ezetimibe]     Hand cramping   Oxycodone -Acetaminophen  Itching   Percocet [Oxycodone -Acetaminophen ] Itching    Able to take tylenol     Home Medications    Prior to Admission medications   Medication Sig Start Date End Date Taking? Authorizing Provider  albuterol  (VENTOLIN  HFA) 108 (90 Base) MCG/ACT inhaler Inhale 2 puffs into the lungs every 6 (six) hours as needed for wheezing or shortness of breath. 03/08/22   Ruthell Lauraine FALCON, NP  amitriptyline (ELAVIL) 10 MG tablet Take 10 mg by mouth at bedtime. 02/22/23   [provider]  aspirin  EC 81 MG tablet Take 1 tablet (81 mg total) by mouth daily. Swallow whole. 02/28/21   Thukkani, Arun K, MD  budeson-glycopyrrolate-formoterol  (BREZTRI  AEROSPHERE) 160-9-4.8 MCG/ACT AERO Inhale 2 puffs into the lungs 2 (two) times daily. 05/16/23   Byrum, Robert S, MD  celecoxib (CELEBREX) 100 MG capsule Take 100 mg by mouth  2 (two) times daily as needed (pain.).    [provider]  cetirizine  (ZYRTEC ) 10 MG tablet Take 10 mg by mouth in the morning. 04/05/23 04/04/24  [provider]  Cholecalciferol (VITAMIN D3) 10 MCG (400 UNIT) tablet Take 400 Units by mouth in the morning. 05/22/23   [provider]  cyclobenzaprine  (FLEXERIL ) 10 MG tablet Take 10 mg by mouth in the morning and at bedtime. 05/18/22   [provider]  Docusate Sodium (DSS) 100 MG CAPS Take  100 mg by mouth in the morning and at bedtime. 03/14/23   [provider]  Evolocumab  (REPATHA  SURECLICK) 140 MG/ML SOAJ Inject 140 mg into the skin every 14 (fourteen) days. 06/25/23   Thukkani, Arun K, MD  fluticasone  (FLONASE ) 50 MCG/ACT nasal spray Place 1 spray into both nostrils in the morning. 04/05/23   [provider]  folic acid  (FOLVITE ) 1 MG tablet Take 1 tablet (1 mg total) by mouth daily. 02/28/22   Rice, Lonni ORN, MD  furosemide  (LASIX ) 20 MG tablet Take 0.5 tablets (10 mg total) by mouth daily. 04/25/23   Thukkani, Arun K, MD  HYDROcodone -acetaminophen  (NORCO) 10-325 MG tablet Take 1 tablet by mouth every 8 (eight) hours as needed (severe pain). 04/19/23   [provider]  metFORMIN (GLUCOPHAGE-XR) 500 MG 24 hr tablet Take 500 mg by mouth 2 (two) times daily. 08/24/21   [provider]  methotrexate  (RHEUMATREX) 2.5 MG tablet Take 8 tablets (20 mg total) by mouth once a week. Caution:Chemotherapy. Protect from light. Patient taking differently: Take 20 mg by mouth every Wednesday. Caution:Chemotherapy. Protect from light. 06/18/23   Jeannetta Lonni ORN, MD  omeprazole (PRILOSEC) 20 MG capsule Take 20 mg by mouth daily before breakfast. 05/29/22 09/25/26  [provider]  pravastatin (PRAVACHOL) 10 MG tablet Take 10 mg by mouth every evening. 09/14/22   [provider]  predniSONE  (DELTASONE ) 5 MG tablet Take 1 tablet (5 mg total) by mouth daily with breakfast. 06/18/23   Rice, Lonni ORN, MD  pregabalin  (LYRICA ) 200 MG capsule Take 200 mg by mouth in the morning and at bedtime. 07/17/22   [provider]  Semaglutide , 1 MG/DOSE, 4 MG/3ML SOPN Inject 1 mg into the skin once a week. Patient taking differently: Inject 1 mg into the skin every Tuesday. 09/18/23   Thukkani, Arun K, MD  sitaGLIPtin (JANUVIA) 50 MG tablet Take 50 mg by mouth in the morning.    [provider]  sulfamethoxazole -trimethoprim  (BACTRIM  DS) 800-160 MG  tablet TAKE 1 TABLET BY MOUTH THREE TIMES WEEKLY (ON MONDAY, WEDNESDAY & FRIDAY) 06/02/23   Darlean Ozell NOVAK, MD  valsartan  (DIOVAN ) 80 MG tablet Take 1 tablet (80 mg total) by mouth daily. Patient taking differently: Take 80 mg by mouth every evening. 04/25/23   Thukkani, Arun K, MD    Physical Exam    Vital Signs:  Carlin Sergio Mulch does not have vital signs available for review today.  Given telephonic nature of communication, physical exam is limited. AAOx3. NAD. Normal affect.  Speech and respirations are unlabored.  Accessory Clinical Findings    None  Assessment & Plan    1.  Preoperative Cardiovascular Risk Assessment:  According to the Revised Cardiac Risk Index (RCRI), his Perioperative Risk of Major Cardiac Event is (%): 0.4. His Functional Capacity in METs is: 5.07 according to the [facility] Activity Status Index (DASI). Therefore, based on ACC/AHA guidelines, patient would be at acceptable risk for the planned procedure without further  cardiovascular testing.  The patient was advised that if he develops new symptoms prior to surgery to contact our office to arrange for a follow-up visit, and he verbalized understanding.  Per office protocol, he may hold Aspirin  for 5-7 days prior to procedure. Please resume Aspirin  as soon as possible postprocedure, at the discretion of the surgeon.   A copy of this note will be routed to requesting surgeon.  Time:   Today, I have spent 5 minutes with the patient with telehealth technology discussing medical history, symptoms, and management plan.     Damien JAYSON Braver, NP  09/27/2023, 10:30 AM

## 2023-09-28 NOTE — Anesthesia Preprocedure Evaluation (Signed)
 Anesthesia Evaluation  Patient identified by MRN, date of birth, ID band Patient awake    Reviewed: Allergy  & Precautions, NPO status , Patient's Chart, lab work & pertinent test results  Airway Mallampati: II  TM Distance: >3 FB Neck ROM: Limited    Dental  (+) Edentulous Upper, Missing   Pulmonary shortness of breath, COPD, former smoker Sarcoidosis   Pulmonary exam normal breath sounds clear to auscultation       Cardiovascular hypertension, Pt. on medications Normal cardiovascular exam     Neuro/Psych  negative psych ROS   GI/Hepatic ,GERD  Medicated and Controlled,,(+)     substance abuse    Endo/Other  diabetes  Patient on GLP-1 Agonist  Renal/GU negative Renal ROS     Musculoskeletal  (+) Arthritis ,  narcotic dependentChronic back pain   Abdominal  (+) + obese  Peds  Hematology negative hematology ROS (+)   Anesthesia Other Findings CERVICAL MYELOPATHY  Reproductive/Obstetrics                              Anesthesia Physical Anesthesia Plan  ASA: 3  Anesthesia Plan: General   Post-op Pain Management: Ketamine  IV*   Induction: Intravenous  PONV Risk Score and Plan: 2 and Ondansetron , Dexamethasone , Midazolam  and Treatment may vary due to age or medical condition  Airway Management Planned: Oral ETT and Video Laryngoscope Planned  Additional Equipment:   Intra-op Plan:   Post-operative Plan: Extubation in OR  Informed Consent: I have reviewed the patients History and Physical, chart, labs and discussed the procedure including the risks, benefits and alternatives for the proposed anesthesia with the patient or authorized representative who has indicated his/her understanding and acceptance.     Dental advisory given  Plan Discussed with: CRNA  Anesthesia Plan Comments: (PAT note written 09/28/2023 by Enoc Getter, PA-C.  )         Anesthesia Quick  Evaluation

## 2023-09-28 NOTE — Progress Notes (Signed)
 Anesthesia Chart Review:   Case: [id number] Date/Time: 10/03/23 1445   Procedure: ANTERIOR CERVICAL DECOMPRESSION/DISCECTOMY FUSION 2 LEVELS - ANTERIOR CERVICAL DECOMPRESSION FUSION CERVICAL 4- CERVICAL 5, CERVICAL 5- CERVICAL 6 WITH INSTRUMENTATION AND ALLOGRAFT   Anesthesia type: General   Pre-op diagnosis: CERVICAL MYELOPATHY   [facility] / [facility]   Surgeons: Beuford Anes, MD       DISCUSSION: Patient is a 53 year old male scheduled for the above procedure.  History includes former smoker (quit 01/13/11), sarcoidosis, chronic dyspnea, asthma, HLD, HTN, GERD, DM2, arthritis, chronic back pain.  BMI is consistent with obesity.  He was established with cardiologist Dr. Wendel in early 2023 for dyspnea and chest tightness in setting of Sarcoidosis.  He had reassuring coronary CTA, cardiac MRI, and PET scan.  No evidence of cardiac sarcoidosis.  He is on atorvastatin  and Zetia for HLD with elevated LP(a).  Dyspnea felt mostly from pulmonary etiology. He had a preoperative telephonic cardiology evaluation by Daneen Perkins, NP on 09/27/23. She wrote, According to the Revised Cardiac Risk Index (RCRI), his Perioperative Risk of Major Cardiac Event is (%): 0.4. His Functional Capacity in METs is: 5.07 according to the Duke Activity Status Index (DASI). Therefore, based on ACC/AHA guidelines, patient would be at acceptable risk for the planned procedure without further cardiovascular testing... Per office protocol, he may hold Aspirin  for 5-7 days prior to procedure. Please resume Aspirin  as soon as possible postprocedure, at the discretion of the surgeon.   He had pulmonology visit with Dr. Shelah on 09/20/2023 for follow-up asthma and sarcoidosis. He in on prednisone , methotrexate , and prophylactic Bactrim  for sarcoidosis.  He is on Breztri  and as needed albuterol  for asthma. In regards to preoperative evaluation, Dr. Shelah wrote, Your sarcoidosis and your associated COPD put you at high risk  for general anesthesia which could include longer mechanical ventilation, longer hospitalization or other complications. That said your studies and your overall function are stable and there is no absolute contraindication to proceeding with your cervical spine surgery if the benefits outweigh these risks. Certainly you need to remain on your inhaled medications in the perioperative period.  A1c 6.2% on 08/27/23. Last semaglutide  09/25/23.   Anesthesia team to evaluate on the day of surgery. Per lab, he will need CBC repeated on the day of surgery. (Normal CBC on 06/18/23.)  Last ASA 09/25/23.  He is on chronic prednisone  at 5 mg daily.   VS: BP (!) 141/102   Pulse 94   Temp 36.8 C   Resp 17   Ht 5' 8 (1.727 m)   Wt 108.9 kg   SpO2 98%   BMI 36.49 kg/m  BP Readings from Last 3 Encounters:  09/27/23 (!) 141/102  09/20/23 130/88  06/18/23 101/67    PROVIDERS: Medicine, [facility] Wendel Haws, MD is cardiologist Shelah Charleston, MD is pulmonologist Jeannetta Bruckner, MD is an otologist   LABS: Preoperative labs noted. See DISCUSSION. Needs CBC DOS. A1c 6.2%, TSH 1.66, FT4 0.89 on 08/27/23 (Care Everywhere). (all labs ordered are listed, but only abnormal results are displayed)  Labs Reviewed  BASIC METABOLIC PANEL WITH GFR - Abnormal; Notable for the following components:      Result Value   Glucose, Bld 101 (*)    Calcium  8.8 (*)    All other components within normal limits  GLUCOSE, CAPILLARY - Abnormal; Notable for the following components:   Glucose-Capillary 132 (*)    All other components within normal limits  SURGICAL PCR SCREEN    PFTs 09/17/23: FVC 1.82 (37%), post 1.90 (38%). FEV1 1.34 (35%), post 1.48 *38%). DLCO unc 14.58 (51%). Reviewed by Dr. Shelah: show severe mixed obstruction and restriction with a borderline bronchodilator response, restricted lung volumes, decreased diffusion capacity that corrects to the normal range when adjusted for alveolar  volume.   IMAGES: MRI c-spine 08/25/23 (ordered by Dr. Beuford): MPRESSION: 1. Cervical spondylosis as outlined within the body of the report. 2. At C5-C6, there is multifactorial moderate spinal canal stenosis with mild spinal cord flattening. Severe bilateral neural foraminal narrowing. 3. At C4-C5, a disc bulge contributes to mild spinal canal stenosis and mildly flattens the ventral aspect of the spinal cord. Moderate left neural from narrowing. 4. No significant spinal canal stenosis at the remaining levels. 5. Additional sites of foraminal stenosis, greatest on the right at C6-C7 (severe at this site). 6. Disc degeneration is greatest at C5-C6 and C6-C7 (mild-to-moderate at these levels). 7. 10 mm lesion along the deep lobe of the right parotid gland, which may reflect a primary parotid neoplasm or a cyst. Consider ENT referral.   1V PCXR 04/28/23: FINDINGS: - Cardiac silhouette normal in size.  No mediastinal or hilar masses. - Low lung volumes.  Elevated right hemidiaphragm, stable. - Mild linear opacities at the lung bases, more evident on the right, consistent with atelectasis. Linear scarring noted in the left upper lobe. No evidence of pneumonia or pulmonary edema. - No pleural effusion or pneumothorax. - Skeletal structures are grossly intact. IMPRESSION: No active disease.   CTA Chest 02/04/23: IMPRESSION: 1. Negative for pulmonary embolism. 2. Nodal and pulmonary sarcoid without acute superimposed finding.     EKG: 04/28/23:  Sinus rhythm Left axis deviation Confirmed by Cottie Cough [phone]) on 04/28/2023 2:47:17 PM   CV: Cardiac PET/CT Metabolic/Perfusion Study 02/10/22 ([facility]): FINAL COMMENTS  - There is no abnormal metabolism to indicate the presence of an active inflammatory process in the left ventricular myocardium.  - There is no abnormal metabolism in the right ventricle.  - There is no evidence to suggest active cardiac sarcoidosis.     Perfusion/Function:   1. Normal myocardial  perfusion.  2. Normal left ventricular systolic function.    Non-diagnostic CT obtained for attenuation correction:  1. Hypermetabolic mediastinal and hilar lymphadenopathy in appropriate clinical context is compatible with active granulomatous disease. There are bilateral pulmonary parenchymal opacities also consistent with infectious/inflammatory etiology.    Cardiac MRI 11/16/21: IMPRESSION: 1. Small area of basal lateral subendocardial LGE. Given known extracardiac sarcoid, cardiac sarcoid is on differential though tends to present with multifocal LGE. Prior myocarditis also on differential. Recommend cardiac PET to evaluate for cardiac sarcoid 2.  Normal LV size, wall thickness, and systolic function (EF 61%) 3.  Normal RV size and systolic function (EF 50%)    CTA Coronary 09/12/21: IMPRESSION: 1. Coronary calcium  score of 0. This was 0 percentile for age and sex matched control. 2. Normal coronary origin with right dominance. 3. No evidence of CAD. CAD-RADS 0. No evidence of CAD (0%). Consider non-atherosclerotic causes of chest pain.    Echo 03/10/21: IMPRESSIONS   1. Left ventricular ejection fraction, by estimation, is 55 to 60%. The  left ventricle has normal function. The left ventricle has no regional  wall motion abnormalities. Left ventricular diastolic parameters were  normal.   2. Right ventricular systolic function is normal. The right ventricular  size is normal. Tricuspid regurgitation signal is inadequate for assessing  PA pressure.  3. The mitral valve is grossly normal. Trivial mitral valve  regurgitation. No evidence of mitral stenosis.   4. The aortic valve is tricuspid. There is mild thickening of the aortic  valve. Aortic valve regurgitation is not visualized. Aortic valve  sclerosis is present, with no evidence of aortic valve stenosis.   5. The inferior vena cava is dilated in size with <50% respiratory   variability, suggesting right atrial pressure of 15 mmHg.  - Comparison(s): No prior Echocardiogram.    Past Medical History:  Diagnosis Date   Acid reflux disease    Arthritis    Asthma    'I think I might have asthma   Chronic back pain    Diabetes mellitus without complication (HCC)    Dyspnea    chronic   Hyperlipidemia    Hypertension    Sarcoidosis     Past Surgical History:  Procedure Laterality Date   NO PAST SURGERIES      MEDICATIONS:  albuterol  (VENTOLIN  HFA) 108 (90 Base) MCG/ACT inhaler   amitriptyline (ELAVIL) 10 MG tablet   aspirin  EC 81 MG tablet   budeson-glycopyrrolate-formoterol  (BREZTRI  AEROSPHERE) 160-9-4.8 MCG/ACT AERO   celecoxib (CELEBREX) 100 MG capsule   cetirizine  (ZYRTEC ) 10 MG tablet   Cholecalciferol (VITAMIN D3) 10 MCG (400 UNIT) tablet   cyclobenzaprine  (FLEXERIL ) 10 MG tablet   Docusate Sodium (DSS) 100 MG CAPS   Evolocumab  (REPATHA  SURECLICK) 140 MG/ML SOAJ   fluticasone  (FLONASE ) 50 MCG/ACT nasal spray   folic acid  (FOLVITE ) 1 MG tablet   furosemide  (LASIX ) 20 MG tablet   HYDROcodone -acetaminophen  (NORCO) 10-325 MG tablet   metFORMIN (GLUCOPHAGE-XR) 500 MG 24 hr tablet   methotrexate  (RHEUMATREX) 2.5 MG tablet   omeprazole (PRILOSEC) 20 MG capsule   pravastatin (PRAVACHOL) 10 MG tablet   predniSONE  (DELTASONE ) 5 MG tablet   pregabalin  (LYRICA ) 200 MG capsule   Semaglutide , 1 MG/DOSE, 4 MG/3ML SOPN   sitaGLIPtin (JANUVIA) 50 MG tablet   sulfamethoxazole -trimethoprim  (BACTRIM  DS) 800-160 MG tablet   valsartan  (DIOVAN ) 80 MG tablet   No current facility-administered medications for this encounter.    Isaiah Ruder, PA-C Surgical Short Stay/Anesthesiology [facility] Phone [phone] [facility] Phone [phone] 09/28/2023 5:30 PM

## 2023-10-03 ENCOUNTER — Encounter (HOSPITAL_COMMUNITY)
Admission: RE | Disposition: A | Discharge status: Home / Self Care | Payer: Self-pay | Source: Home / Self Care | Attending: Orthopedic Surgery

## 2023-10-03 ENCOUNTER — Other Ambulatory Visit: Payer: Self-pay

## 2023-10-03 ENCOUNTER — Ambulatory Visit (HOSPITAL_COMMUNITY)

## 2023-10-03 ENCOUNTER — Ambulatory Visit (HOSPITAL_COMMUNITY)
Admission: RE | Admit: 2023-10-03 | Discharge: 2023-10-03 | Disposition: A | Discharge status: Home / Self Care | Attending: Orthopedic Surgery | Admitting: Orthopedic Surgery

## 2023-10-03 ENCOUNTER — Ambulatory Visit (HOSPITAL_BASED_OUTPATIENT_CLINIC_OR_DEPARTMENT_OTHER): Admitting: Anesthesiology

## 2023-10-03 ENCOUNTER — Ambulatory Visit (HOSPITAL_COMMUNITY): Payer: Self-pay | Admitting: Physician Assistant

## 2023-10-03 ENCOUNTER — Encounter (HOSPITAL_COMMUNITY): Payer: Self-pay | Admitting: Orthopedic Surgery

## 2023-10-03 DIAGNOSIS — Z79899 Other long term (current) drug therapy: Secondary | ICD-10-CM | POA: Diagnosis not present

## 2023-10-03 DIAGNOSIS — Z7985 Long-term (current) use of injectable non-insulin antidiabetic drugs: Secondary | ICD-10-CM | POA: Insufficient documentation

## 2023-10-03 DIAGNOSIS — E119 Type 2 diabetes mellitus without complications: Secondary | ICD-10-CM | POA: Diagnosis not present

## 2023-10-03 DIAGNOSIS — E785 Hyperlipidemia, unspecified: Secondary | ICD-10-CM

## 2023-10-03 DIAGNOSIS — J449 Chronic obstructive pulmonary disease, unspecified: Secondary | ICD-10-CM | POA: Insufficient documentation

## 2023-10-03 DIAGNOSIS — K219 Gastro-esophageal reflux disease without esophagitis: Secondary | ICD-10-CM | POA: Insufficient documentation

## 2023-10-03 DIAGNOSIS — M5412 Radiculopathy, cervical region: Secondary | ICD-10-CM | POA: Diagnosis present

## 2023-10-03 DIAGNOSIS — G959 Disease of spinal cord, unspecified: Secondary | ICD-10-CM

## 2023-10-03 DIAGNOSIS — D869 Sarcoidosis, unspecified: Secondary | ICD-10-CM | POA: Diagnosis not present

## 2023-10-03 DIAGNOSIS — G8929 Other chronic pain: Secondary | ICD-10-CM | POA: Diagnosis not present

## 2023-10-03 DIAGNOSIS — G992 Myelopathy in diseases classified elsewhere: Secondary | ICD-10-CM | POA: Insufficient documentation

## 2023-10-03 DIAGNOSIS — J45909 Unspecified asthma, uncomplicated: Secondary | ICD-10-CM | POA: Diagnosis not present

## 2023-10-03 DIAGNOSIS — Z87891 Personal history of nicotine dependence: Secondary | ICD-10-CM | POA: Diagnosis not present

## 2023-10-03 DIAGNOSIS — M4802 Spinal stenosis, cervical region: Secondary | ICD-10-CM | POA: Diagnosis not present

## 2023-10-03 DIAGNOSIS — I10 Essential (primary) hypertension: Secondary | ICD-10-CM | POA: Diagnosis not present

## 2023-10-03 HISTORY — PX: ANTERIOR CERVICAL DECOMP/DISCECTOMY FUSION: SHX1161

## 2023-10-03 LAB — CBC
HCT: 37.2 % — ABNORMAL LOW (ref 39.0–52.0)
Hemoglobin: 12.5 g/dL — ABNORMAL LOW (ref 13.0–17.0)
MCH: 28.5 pg (ref 26.0–34.0)
MCHC: 33.6 g/dL (ref 30.0–36.0)
MCV: 84.9 fL (ref 80.0–100.0)
Platelets: 295 K/uL (ref 150–400)
RBC: 4.38 MIL/uL (ref 4.22–5.81)
RDW: 13.9 % (ref 11.5–15.5)
WBC: 4.9 K/uL (ref 4.0–10.5)
nRBC: 0 % (ref 0.0–0.2)

## 2023-10-03 LAB — GLUCOSE, CAPILLARY
Glucose-Capillary: 134 mg/dL — ABNORMAL HIGH (ref 70–99)
Glucose-Capillary: 103 mg/dL — ABNORMAL HIGH (ref 70–99)
Glucose-Capillary: 133 mg/dL — ABNORMAL HIGH (ref 70–99)

## 2023-10-03 SURGERY — ANTERIOR CERVICAL DECOMPRESSION/DISCECTOMY FUSION 2 LEVELS
Anesthesia: General

## 2023-10-03 MED ORDER — DEXAMETHASONE SODIUM PHOSPHATE 10 MG/ML IJ SOLN
INTRAMUSCULAR | Status: AC
Start: 2023-10-03 — End: 2023-10-03
  Filled 2023-10-03: qty 1

## 2023-10-03 MED ORDER — FENTANYL CITRATE (PF) 100 MCG/2ML IJ SOLN
INTRAMUSCULAR | Status: AC
  Filled 2023-10-03: qty 2

## 2023-10-03 MED ORDER — KETAMINE HCL 50 MG/5ML IJ SOSY
PREFILLED_SYRINGE | INTRAMUSCULAR | Status: DC | PRN
  Administered 2023-10-03: 5 mg via INTRAVENOUS
  Administered 2023-10-03: 15 mg via INTRAVENOUS

## 2023-10-03 MED ORDER — PROPOFOL 10 MG/ML IV BOLUS
INTRAVENOUS | Status: DC | PRN
  Administered 2023-10-03: 200 mg via INTRAVENOUS

## 2023-10-03 MED ORDER — ACETAMINOPHEN 10 MG/ML IV SOLN
1000.0000 mg | Freq: Once | INTRAVENOUS | Status: DC | PRN
  Administered 2023-10-03: 1000 mg via INTRAVENOUS

## 2023-10-03 MED ORDER — KETAMINE HCL 50 MG/5ML IJ SOSY
PREFILLED_SYRINGE | INTRAMUSCULAR | Status: AC
  Filled 2023-10-03: qty 5

## 2023-10-03 MED ORDER — DEXMEDETOMIDINE HCL IN NACL 80 MCG/20ML IV SOLN
INTRAVENOUS | Status: AC
Start: 2023-10-03 — End: 2023-10-03
  Filled 2023-10-03: qty 20

## 2023-10-03 MED ORDER — ORAL CARE MOUTH RINSE: 15.0000 mL | Freq: Once | OROMUCOSAL | Status: AC

## 2023-10-03 MED ORDER — OXYCODONE HCL 5 MG/5ML PO SOLN
ORAL | Status: AC
  Filled 2023-10-03: qty 5

## 2023-10-03 MED ORDER — HYDROCODONE-ACETAMINOPHEN 10-325 MG PO TABS: 1.0000 | ORAL_TABLET | ORAL | Status: DC | PRN

## 2023-10-03 MED ORDER — BUPIVACAINE-EPINEPHRINE (PF) 0.25% -1:200000 IJ SOLN
INTRAMUSCULAR | Status: AC
  Filled 2023-10-03: qty 30

## 2023-10-03 MED ORDER — 0.9 % SODIUM CHLORIDE (POUR BTL) OPTIME
TOPICAL | Status: DC | PRN
  Administered 2023-10-03: 1000 mL

## 2023-10-03 MED ORDER — FENTANYL CITRATE (PF) 250 MCG/5ML IJ SOLN
INTRAMUSCULAR | Status: DC | PRN
  Administered 2023-10-03: 50 ug via INTRAVENOUS
  Administered 2023-10-03: 100 ug via INTRAVENOUS
  Administered 2023-10-03 (×2): 50 ug via INTRAVENOUS

## 2023-10-03 MED ORDER — PROPOFOL 10 MG/ML IV BOLUS
INTRAVENOUS | Status: AC
  Filled 2023-10-03: qty 20

## 2023-10-03 MED ORDER — ONDANSETRON HCL 4 MG/2ML IJ SOLN
INTRAMUSCULAR | Status: AC
Start: 2023-10-03 — End: 2023-10-03
  Filled 2023-10-03: qty 2

## 2023-10-03 MED ORDER — ROCURONIUM BROMIDE 10 MG/ML (PF) SYRINGE
PREFILLED_SYRINGE | INTRAVENOUS | Status: DC | PRN
  Administered 2023-10-03: 100 mg via INTRAVENOUS
  Administered 2023-10-03: 20 mg via INTRAVENOUS

## 2023-10-03 MED ORDER — OXYCODONE HCL 5 MG/5ML PO SOLN
5.0000 mg | Freq: Once | ORAL | Status: AC
  Administered 2023-10-03: 5 mg via ORAL

## 2023-10-03 MED ORDER — BUPIVACAINE-EPINEPHRINE 0.25% -1:200000 IJ SOLN
INTRAMUSCULAR | Status: DC | PRN
Start: 2023-10-03 — End: 2023-10-03
  Administered 2023-10-03: 6 mL

## 2023-10-03 MED ORDER — CHLORHEXIDINE GLUCONATE 0.12 % MT SOLN
15.0000 mL | Freq: Once | OROMUCOSAL | Status: AC
  Administered 2023-10-03: 15 mL via OROMUCOSAL
  Filled 2023-10-03: qty 15

## 2023-10-03 MED ORDER — THROMBIN 20000 UNITS EX SOLR
CUTANEOUS | Status: DC | PRN
  Administered 2023-10-03: 20 mL

## 2023-10-03 MED ORDER — POVIDONE-IODINE 7.5 % EX SOLN
Freq: Once | CUTANEOUS | Status: DC
  Filled 2023-10-03: qty 118

## 2023-10-03 MED ORDER — HYDROCODONE-ACETAMINOPHEN 10-325 MG PO TABS: 1.0000 | ORAL_TABLET | ORAL | 0 refills | Status: DC | PRN

## 2023-10-03 MED ORDER — ACETAMINOPHEN 10 MG/ML IV SOLN
INTRAVENOUS | Status: AC
  Filled 2023-10-03: qty 100

## 2023-10-03 MED ORDER — PHENYLEPHRINE 80 MCG/ML (10ML) SYRINGE FOR IV PUSH (FOR BLOOD PRESSURE SUPPORT)
PREFILLED_SYRINGE | INTRAVENOUS | Status: DC | PRN
  Administered 2023-10-03 (×2): 80 ug via INTRAVENOUS

## 2023-10-03 MED ORDER — INSULIN ASPART 100 UNIT/ML IJ SOLN: 0.0000 [IU] | INTRAMUSCULAR | Status: DC | PRN

## 2023-10-03 MED ORDER — SUGAMMADEX SODIUM 200 MG/2ML IV SOLN
INTRAVENOUS | Status: DC | PRN
  Administered 2023-10-03: 220 mg via INTRAVENOUS

## 2023-10-03 MED ORDER — MIDAZOLAM HCL 2 MG/2ML IJ SOLN
INTRAMUSCULAR | Status: AC
  Filled 2023-10-03: qty 2

## 2023-10-03 MED ORDER — ONDANSETRON HCL 4 MG/2ML IJ SOLN
INTRAMUSCULAR | Status: DC | PRN
  Administered 2023-10-03: 4 mg via INTRAVENOUS

## 2023-10-03 MED ORDER — CEFAZOLIN SODIUM-DEXTROSE 2-4 GM/100ML-% IV SOLN
2.0000 g | INTRAVENOUS | Status: AC
  Administered 2023-10-03: 2 g via INTRAVENOUS
  Filled 2023-10-03: qty 100

## 2023-10-03 MED ORDER — LIDOCAINE 2% (20 MG/ML) 5 ML SYRINGE
INTRAMUSCULAR | Status: AC
  Filled 2023-10-03: qty 5

## 2023-10-03 MED ORDER — THROMBIN 20000 UNITS EX SOLR
CUTANEOUS | Status: AC
  Filled 2023-10-03: qty 20000

## 2023-10-03 MED ORDER — FENTANYL CITRATE (PF) 100 MCG/2ML IJ SOLN
25.0000 ug | INTRAMUSCULAR | Status: DC | PRN
  Administered 2023-10-03 (×2): 50 ug via INTRAVENOUS

## 2023-10-03 MED ORDER — MIDAZOLAM HCL 2 MG/2ML IJ SOLN
INTRAMUSCULAR | Status: DC | PRN
  Administered 2023-10-03: 2 mg via INTRAVENOUS

## 2023-10-03 MED ORDER — CEFAZOLIN SODIUM 1 G IJ SOLR
INTRAMUSCULAR | Status: AC
  Filled 2023-10-03: qty 40

## 2023-10-03 MED ORDER — LACTATED RINGERS IV SOLN: INTRAVENOUS | Status: DC

## 2023-10-03 MED ORDER — FENTANYL CITRATE (PF) 250 MCG/5ML IJ SOLN
INTRAMUSCULAR | Status: AC
  Filled 2023-10-03: qty 5

## 2023-10-03 MED ORDER — ROCURONIUM BROMIDE 10 MG/ML (PF) SYRINGE
PREFILLED_SYRINGE | INTRAVENOUS | Status: AC
  Filled 2023-10-03: qty 10

## 2023-10-03 MED ORDER — AMISULPRIDE (ANTIEMETIC) 5 MG/2ML IV SOLN: 10.0000 mg | Freq: Once | INTRAVENOUS | Status: DC | PRN

## 2023-10-03 MED ORDER — DEXAMETHASONE SODIUM PHOSPHATE 10 MG/ML IJ SOLN
INTRAMUSCULAR | Status: DC | PRN
  Administered 2023-10-03: 10 mg via INTRAVENOUS

## 2023-10-03 MED ORDER — LIDOCAINE 2% (20 MG/ML) 5 ML SYRINGE
INTRAMUSCULAR | Status: DC | PRN
  Administered 2023-10-03: 60 mg via INTRAVENOUS

## 2023-10-03 SURGICAL SUPPLY — 62 items
BAG COUNTER SPONGE SURGICOUNT (BAG) ×1 IMPLANT
BENZOIN TINCTURE PRP APPL 2/3 (GAUZE/BANDAGES/DRESSINGS) ×1 IMPLANT
BIT DRILL NEURO 2X3.1 SFT TUCH (MISCELLANEOUS) ×1 IMPLANT
BIT DRILL SRG 14X2.2XFLT CHK (BIT) IMPLANT
BLADE CLIPPER SURG (BLADE) ×1 IMPLANT
BLADE SURG 15 STRL LF DISP TIS (BLADE) ×1 IMPLANT
CAGE LORDOTIC TC SM 8 6D (Cage) IMPLANT
CANISTER SUCTION 3000ML PPV (SUCTIONS) ×1 IMPLANT
COLLAR CERV LO CONTOUR FIRM DE (SOFTGOODS) IMPLANT
CORD BIPOLAR FORCEPS 12FT (ELECTRODE) ×1 IMPLANT
COVER SURGICAL LIGHT HANDLE (MISCELLANEOUS) ×1 IMPLANT
DRAPE C-ARM 42X72 X-RAY (DRAPES) ×1 IMPLANT
DRAPE LAPAROTOMY 100X72 PEDS (DRAPES) IMPLANT
DRAPE POUCH INSTRU U-SHP 10X18 (DRAPES) ×1 IMPLANT
DRAPE SURG 17X23 STRL (DRAPES) ×4 IMPLANT
DURAPREP 26ML APPLICATOR (WOUND CARE) ×1 IMPLANT
ELECT COATED BLADE 2.86 ST (ELECTRODE) ×1 IMPLANT
ELECTRODE REM PT RTRN 9FT ADLT (ELECTROSURGICAL) ×1 IMPLANT
GAUZE 4X4 16PLY ~~LOC~~+RFID DBL (SPONGE) ×1 IMPLANT
GAUZE SPONGE 4X4 12PLY STRL (GAUZE/BANDAGES/DRESSINGS) ×1 IMPLANT
GLOVE BIO SURGEON STRL SZ 6.5 (GLOVE) ×1 IMPLANT
GLOVE BIO SURGEON STRL SZ8 (GLOVE) ×1 IMPLANT
GLOVE BIOGEL PI IND STRL 7.0 (GLOVE) ×2 IMPLANT
GLOVE BIOGEL PI IND STRL 8 (GLOVE) ×1 IMPLANT
GLOVE SURG ENC MOIS LTX SZ6.5 (GLOVE) ×1 IMPLANT
GOWN STRL REUS W/ TWL LRG LVL3 (GOWN DISPOSABLE) ×1 IMPLANT
GOWN STRL REUS W/ TWL XL LVL3 (GOWN DISPOSABLE) ×1 IMPLANT
GRAFT BNE MATRIX VG FRMBL SM 1 (Bone Implant) IMPLANT
IV CATH 14GX2 1/4 (CATHETERS) ×1 IMPLANT
KIT BASIN OR (CUSTOM PROCEDURE TRAY) ×1 IMPLANT
KIT TURNOVER KIT B (KITS) ×1 IMPLANT
MANIFOLD NEPTUNE II (INSTRUMENTS) ×1 IMPLANT
NDL PRECISIONGLIDE 27X1.5 (NEEDLE) ×1 IMPLANT
NDL SPNL 18GX3.5 QUINCKE PK (NEEDLE) ×1 IMPLANT
NEEDLE PRECISIONGLIDE 27X1.5 (NEEDLE) ×1 IMPLANT
NEEDLE SPNL 18GX3.5 QUINCKE PK (NEEDLE) ×1 IMPLANT
NS IRRIG 1000ML POUR BTL (IV SOLUTION) ×1 IMPLANT
PACK ORTHO CERVICAL (CUSTOM PROCEDURE TRAY) ×1 IMPLANT
PAD ARMBOARD POSITIONER FOAM (MISCELLANEOUS) ×2 IMPLANT
PATTIES SURGICAL .5 X.5 (GAUZE/BANDAGES/DRESSINGS) ×1 IMPLANT
PATTIES SURGICAL .5 X1 (DISPOSABLE) IMPLANT
PIN DISTRACTION 14 (PIN) IMPLANT
PLATE SKYLINE 2 LEVEL 34MM (Plate) IMPLANT
POSITIONER HEAD DONUT 9IN (MISCELLANEOUS) ×1 IMPLANT
SCREW RESCUE SKYLINE 16MM (Screw) IMPLANT
SCREW SKYLINE VAR OS 14MM (Screw) IMPLANT
SPIKE FLUID TRANSFER (MISCELLANEOUS) ×1 IMPLANT
SPONGE INTESTINAL PEANUT (DISPOSABLE) ×1 IMPLANT
SPONGE SURGIFOAM ABS GEL 100 (HEMOSTASIS) ×1 IMPLANT
STRIP CLOSURE SKIN 1/2X4 (GAUZE/BANDAGES/DRESSINGS) ×1 IMPLANT
SURGIFLO W/THROMBIN 8M KIT (HEMOSTASIS) IMPLANT
SUT MNCRL AB 4-0 PS2 18 (SUTURE) ×1 IMPLANT
SUT SILK 4-0 18XBRD TIE 12 (SUTURE) IMPLANT
SUT VIC AB 2-0 CT2 18 VCP726D (SUTURE) ×1 IMPLANT
SYR BULB IRRIG 60ML STRL (SYRINGE) ×1 IMPLANT
SYR CONTROL 10ML LL (SYRINGE) ×3 IMPLANT
TAPE CLOTH 4X10 WHT NS (GAUZE/BANDAGES/DRESSINGS) ×1 IMPLANT
TAPE UMBILICAL 1/8X30 (MISCELLANEOUS) ×2 IMPLANT
TOWEL GREEN STERILE (TOWEL DISPOSABLE) ×1 IMPLANT
TOWEL GREEN STERILE FF (TOWEL DISPOSABLE) ×1 IMPLANT
WATER STERILE IRR 1000ML POUR (IV SOLUTION) ×1 IMPLANT
YANKAUER SUCT BULB TIP NO VENT (SUCTIONS) ×1 IMPLANT

## 2023-10-03 NOTE — H&P (Signed)
 PREOPERATIVE H&P  Chief Complaint: Bilateral hand numbness, tingling, and weakness, balance deterioration  HPI: Nathan [name] is a 53 y.o. male who presents with ongoing and progressive cervical myelopathy  MRI reveals spinal stenosis, including spinal cord compression, spanning C4-5 and C5-6  Patient has failed multiple forms of conservative care and continues to have pain (see office notes for additional details regarding the patient's full course of treatment)  Past Medical History:  Diagnosis Date   Acid reflux disease    Arthritis    Asthma    'I think I might have asthma   Chronic back pain    Diabetes mellitus without complication (HCC)    Dyspnea    chronic   Hyperlipidemia    Hypertension    Sarcoidosis    Past Surgical History:  Procedure Laterality Date   COLONOSCOPY     DENTAL SURGERY     NO PAST SURGERIES     Social History   Socioeconomic History   Marital status: Married    Spouse name: Not on file   Number of children: 2   Years of education: Not on file   Highest education level: Not on file  Occupational History   Occupation: [employer]  Tobacco Use   Smoking status: Former    Current packs/day: 0.00    Average packs/day: 1 pack/day for 20.0 years (20.0 ttl pk-yrs)    Types: Cigarettes    Start date: 01/13/1991    Quit date: 01/13/2011    Years since quitting: 12.7    Passive exposure: Past   Smokeless tobacco: Never  Vaping Use   Vaping status: Never Used  Substance and Sexual Activity   Alcohol use: No    Alcohol/week: 0.0 standard drinks of alcohol   Drug use: No   Sexual activity: Yes    Birth control/protection: Condom  Other Topics Concern   Not on file  Social History Narrative   Not on file   Social Drivers of Health   Financial Resource Strain: Not on File (02/08/2022)   Received from [employer] [facility]    Financial Resource Strain: 0  Recent Concern: Physicist, medical Strain - At Risk  (02/08/2022)   Received from [employer]    Financial Resource Strain: 2  Food Insecurity: At Risk (11/20/2022)   Received from [facility] Insecurity    Within the past 12 months, the food you bought just didn't last and you didn't have enough money to get more.: 2  Transportation Needs: At Risk (11/20/2022)   Received from [facility] Needs    In the past 12 months, has lack of transportation kept you from medical appointments, meetings, work or from getting things needed for daily living? (Check all that apply): 2  Physical Activity: Not on File (02/08/2022)   Received from [facility]   Physical Activity    Physical Activity: 0  Recent Concern: Physical Activity - At Risk (02/08/2022)   Received from [facility]   Physical Activity    Physical Activity: 2  Stress: Not on File (02/08/2022)   Received from [facility]   Stress    Stress: 0  Recent Concern: Stress - At Risk (02/08/2022)   Received from [facility]   Stress    Stress: 2  Social Connections: Not on File (02/08/2022)   Received from [facility]   Social Connections    Connectedness: 0  Recent Concern: Social Connections - At Risk (02/08/2022)  Received from [employer]    Connectedness: 2   Family History  Problem Relation Age of Onset   Hypertension Mother    Diabetes Mother    Heart attack Father    Diabetes Father    Hypertension Father    Gout Father    Colon polyps Sister    Heart disease Paternal Uncle    Prostate cancer Paternal Uncle    Heart disease Maternal Grandfather    Anemia Daughter    Anemia Daughter    Colon cancer Neg Hx    Esophageal cancer Neg Hx    Rectal cancer Neg Hx    Stomach cancer Neg Hx    Allergies  Allergen Reactions   Atorvastatin      Hand cramping on 10-20mg  daily   Rosuvastatin      Hand cramping on 5mg  daily   Tizanidine  Itching   Zetia [Ezetimibe]     Hand cramping   Oxycodone -Acetaminophen  Itching   Percocet [Oxycodone -Acetaminophen ]  Itching    Able to take tylenol    Prior to Admission medications   Medication Sig Start Date End Date Taking? Authorizing Provider  amitriptyline (ELAVIL) 10 MG tablet Take 10 mg by mouth at bedtime. 02/22/23  Yes [provider]  aspirin  EC 81 MG tablet Take 1 tablet (81 mg total) by mouth daily. Swallow whole. 02/28/21  Yes Thukkani, Arun K, MD  budeson-glycopyrrolate-formoterol  (BREZTRI  AEROSPHERE) 160-9-4.8 MCG/ACT AERO Inhale 2 puffs into the lungs 2 (two) times daily. 05/16/23  Yes Shelah Lamar RAMAN, MD  celecoxib (CELEBREX) 100 MG capsule Take 100 mg by mouth 2 (two) times daily as needed (pain.).   Yes [provider]  cetirizine  (ZYRTEC ) 10 MG tablet Take 10 mg by mouth in the morning. 04/05/23 04/04/24 Yes [provider]  Cholecalciferol (VITAMIN D3) 10 MCG (400 UNIT) tablet Take 400 Units by mouth in the morning. 05/22/23  Yes [provider]  cyclobenzaprine  (FLEXERIL ) 10 MG tablet Take 10 mg by mouth in the morning and at bedtime. 05/18/22  Yes [provider]  Docusate Sodium (DSS) 100 MG CAPS Take 100 mg by mouth in the morning and at bedtime. 03/14/23  Yes [provider]  Evolocumab  (REPATHA  SURECLICK) 140 MG/ML SOAJ Inject 140 mg into the skin every 14 (fourteen) days. 06/25/23  Yes Thukkani, Arun K, MD  fluticasone  (FLONASE ) 50 MCG/ACT nasal spray Place 1 spray into both nostrils in the morning. 04/05/23  Yes [provider]  folic acid  (FOLVITE ) 1 MG tablet Take 1 tablet (1 mg total) by mouth daily. 02/28/22  Yes Rice, Lonni ORN, MD  furosemide  (LASIX ) 20 MG tablet Take 0.5 tablets (10 mg total) by mouth daily. 04/25/23  Yes Thukkani, Arun K, MD  HYDROcodone -acetaminophen  (NORCO) 10-325 MG tablet Take 1 tablet by mouth every 8 (eight) hours as needed (severe pain). 04/19/23  Yes [provider]  metFORMIN (GLUCOPHAGE-XR) 500 MG 24 hr tablet Take 500 mg by mouth 2 (two) times daily. 08/24/21  Yes [provider]   methotrexate  (RHEUMATREX) 2.5 MG tablet Take 8 tablets (20 mg total) by mouth once a week. Caution:Chemotherapy. Protect from light. Patient taking differently: Take 20 mg by mouth every Wednesday. Caution:Chemotherapy. Protect from light. 06/18/23  Yes Rice, Lonni ORN, MD  omeprazole (PRILOSEC) 20 MG capsule Take 20 mg by mouth daily before breakfast. 05/29/22 09/25/26 Yes [provider]  pravastatin (PRAVACHOL) 10 MG tablet Take 10 mg by mouth every evening. 09/14/22  Yes [provider]  predniSONE  (  DELTASONE ) 5 MG tablet Take 1 tablet (5 mg total) by mouth daily with breakfast. 06/18/23  Yes Rice, Lonni ORN, MD  pregabalin  (LYRICA ) 200 MG capsule Take 200 mg by mouth in the morning and at bedtime. 07/17/22  Yes [provider]  Semaglutide , 1 MG/DOSE, 4 MG/3ML SOPN Inject 1 mg into the skin once a week. Patient taking differently: Inject 1 mg into the skin every Tuesday. 09/18/23  Yes Thukkani, Arun K, MD  sitaGLIPtin (JANUVIA) 50 MG tablet Take 50 mg by mouth in the morning.   Yes [provider]  sulfamethoxazole -trimethoprim  (BACTRIM  DS) 800-160 MG tablet TAKE 1 TABLET BY MOUTH THREE TIMES WEEKLY (ON MONDAY, WEDNESDAY & FRIDAY) 06/02/23  Yes Darlean Ozell NOVAK, MD  valsartan  (DIOVAN ) 80 MG tablet Take 1 tablet (80 mg total) by mouth daily. Patient taking differently: Take 80 mg by mouth every evening. 04/25/23  Yes Thukkani, Arun K, MD  albuterol  (VENTOLIN  HFA) 108 (90 Base) MCG/ACT inhaler Inhale 2 puffs into the lungs every 6 (six) hours as needed for wheezing or shortness of breath. 03/08/22   Ruthell Lauraine FALCON, NP     All other systems have been reviewed and were otherwise negative with the exception of those mentioned in the HPI and as above.  Physical Exam: Vitals:   10/03/23 1146  BP: (!) 150/99  Pulse: 94  Resp: 18  Temp: 98.2 F (36.8 C)  SpO2: 98%    Body mass index is 36.49 kg/m.  General: Alert, no acute distress Cardiovascular: No pedal  edema Respiratory: No cyanosis, no use of accessory musculature Skin: No lesions in the area of chief complaint Neurologic: Sensation intact distally Psychiatric: Patient is competent for consent with normal mood and affect Lymphatic: No axillary or cervical lymphadenopathy   Assessment/Plan: PROGRESSIVE CERVICAL MYELOPATHY Plan for Procedure(s): ANTERIOR CERVICAL DECOMPRESSION/DISCECTOMY FUSION, C4/5, C5/6   Oneil LITTIE Priestly, MD 10/03/2023 1:32 PM

## 2023-10-03 NOTE — Anesthesia Procedure Notes (Signed)
 Procedure Name: Intubation Date/Time: 10/03/2023 2:53 PM  Performed by: Kaytlin Burklow J, CRNAPre-anesthesia Checklist: Patient identified, Emergency Drugs available, Suction available and Patient being monitored Patient Re-evaluated:Patient Re-evaluated prior to induction Oxygen Delivery Method: Circle System Utilized Preoxygenation: Pre-oxygenation with 100% oxygen Induction Type: IV induction Ventilation: Mask ventilation without difficulty Laryngoscope Size: Glidescope and 4 Grade View: Grade I Tube type: Oral Tube size: 7.5 mm Number of attempts: 1 Airway Equipment and Method: Stylet and Oral airway Placement Confirmation: ETT inserted through vocal cords under direct vision, positive ETCO2 and breath sounds checked- equal and bilateral Secured at: 22 cm Tube secured with: Tape Dental Injury: Teeth and Oropharynx as per pre-operative assessment

## 2023-10-03 NOTE — Op Note (Signed)
 PATIENT NAME: Nathan [name]  MEDICAL RECORD NO.:   [id number]   DATE OF BIRTH:  08-04-1970    DATE OF PROCEDURE:  10/03/2023                                OPERATIVE REPORT     PREOPERATIVE DIAGNOSES: 1. Progressive cervical myelopathy 2. Left-sided cervical radiculopathy 3. Spinal cord compression spanning C4/5 and C5/6   POSTOPERATIVE DIAGNOSES: 1. Progressive cervical myelopathy 2. Left-sided cervical radiculopathy 3. Spinal cord compression spanning C4/5 and C5/6   PROCEDURE: 1. Anterior cervical decompression and fusion C4/5, C5/6 2. Placement of anterior instrumentation, C4-C6 (Of note, the anterior instrumentation was separate from, and not integral to, the intervertebral spacers) 3. Insertion of interbody device x 2 (Titan intervertebral spacers). 4. Intraoperative use of fluoroscopy. 5. Use of morselized allograft - ViviGen.   SURGEON:  Oneil Priestly, MD   ASSISTANT:  None   ANESTHESIA:  General endotracheal anesthesia.   COMPLICATIONS:  None.   DISPOSITION:  Stable.   ESTIMATED BLOOD LOSS:  Minimal.   INDICATIONS FOR SURGERY:  Briefly, Nathan [name] is a pleasant 53 y.o. year-old male, who did present to me with progressive cervical myelopathy, in addition to left arm pain.  The patient's MRI did reveal the findings noted above, most notably, neuroforaminal narrowing and spinal cord compression spanning C4-5 and C5-6. Given the patient's progressive myelopathic symptoms, and ongoing arm pain, we did discuss proceeding with the procedure noted above.  The patient was fully aware of the risks and limitations of surgery as outlined in my preoperative note.   OPERATIVE DETAILS:  On 10/03/2023, the patient was brought to surgery and general endotracheal anesthesia was administered. The patient was placed supine on the hospital bed. The neck was gently extended.  All bony prominences were meticulously padded.  The neck was prepped and draped in the usual sterile fashion.   At this point, I did make a left-sided transverse incision.  The platysma was incised.  A Smith-Robinson approach was used and the anterior spine was identified. A self-retaining retractor was placed.  I then subperiosteally exposed the vertebral bodies from C4-C6.  Caspar pins were then placed into the C5 and C6 vertebral bodies and distraction was applied.  A thorough and complete C5-6 intervertebral diskectomy was performed.  The posterior longitudinal ligament was identified and entered using a nerve hook.  I then used #1 followed by #2 Kerrison to perform a thorough and complete intervertebral diskectomy.  The spinal canal was thoroughly decompressed, as was the right and left neuroforamen. The endplates were then prepared and the appropriate-sized intervertebral spacer was then packed with ViviGen and tamped into position in the usual fashion.  The lower Caspar pin was then removed and placed into the C4 vertebral body and once again, distraction was applied across the C4-5 intervertebral space.  I then again performed a thorough and complete diskectomy, thoroughly decompressing the spinal canal and bilateral neuroforamena.  After preparing the endplates, the appropriate-sized intervertebral spacer was packed with ViviGen and tamped into position. The Caspar pins then were removed and bone wax was placed in their place.  The appropriate-sized anterior cervical plate was placed over the anterior spine.  14 mm variable angle screws were placed, 2 in each vertebral body from C4-C6 for a total of 6 vertebral body screws.  The screws were then locked to the plate using the Cam locking mechanism.  I was  very pleased with the final fluoroscopic images.  The wound was then irrigated.  The wound was then explored for any undue bleeding and there was no bleeding noted. The wound was then closed in layers using 2-0 Vicryl, followed by 4-0 Monocryl.  Benzoin and Steri-Strips were applied, followed by  sterile dressing.  All instrument counts were correct at the termination of the procedure.     Oneil Priestly, MD

## 2023-10-03 NOTE — Transfer of Care (Signed)
 Immediate Anesthesia Transfer of Care Note  Patient: Nathan [name]  Procedure(s) Performed: ANTERIOR CERVICAL DECOMPRESSION/DISCECTOMY FUSION 2 LEVELS  Patient Location: PACU  Anesthesia Type:General  Level of Consciousness: awake, alert , and oriented  Airway & Oxygen Therapy: Patient Spontanous Breathing and Patient connected to face mask oxygen  Post-op Assessment: Report given to RN and Post -op Vital signs reviewed and stable  Post vital signs: Reviewed and stable  Last Vitals:  Vitals Value Taken Time  BP 142/101 10/03/23 17:20  Temp 36.5 C 10/03/23 17:20  Pulse 87 10/03/23 17:25  Resp 23 10/03/23 17:25  SpO2 100 % 10/03/23 17:25  Vitals shown include unfiled device data.  Last Pain:  Vitals:   10/03/23 1207  PainSc: 8       Patients Stated Pain Goal: 1 (10/03/23 1207)  Complications: No notable events documented.

## 2023-10-04 ENCOUNTER — Encounter (HOSPITAL_COMMUNITY): Payer: Self-pay | Admitting: Orthopedic Surgery

## 2023-10-04 NOTE — Anesthesia Postprocedure Evaluation (Signed)
 Anesthesia Post Note  Patient: Nathan [name]  Procedure(s) Performed: ANTERIOR CERVICAL DECOMPRESSION/DISCECTOMY FUSION 2 LEVELS     Patient location during evaluation: PACU Anesthesia Type: General Level of consciousness: awake and alert, oriented and patient cooperative Pain management: pain level controlled Vital Signs Assessment: post-procedure vital signs reviewed and stable Respiratory status: spontaneous breathing, nonlabored ventilation and respiratory function stable Cardiovascular status: blood pressure returned to baseline and stable Postop Assessment: no apparent nausea or vomiting Anesthetic complications: no   No notable events documented.  Last Vitals:  Vitals:   10/03/23 1845 10/03/23 1900  BP: 132/88 (!) 128/91  Pulse: 88 86  Resp: 15 18  Temp:  36.5 C  SpO2: 94% 92%    Last Pain:  Vitals:   10/03/23 1900  PainSc: 3                  Almarie CHRISTELLA Marchi

## 2023-10-09 ENCOUNTER — Ambulatory Visit: Admitting: Internal Medicine

## 2023-10-09 DIAGNOSIS — D869 Sarcoidosis, unspecified: Secondary | ICD-10-CM

## 2023-10-09 DIAGNOSIS — Z7952 Long term (current) use of systemic steroids: Secondary | ICD-10-CM

## 2023-10-09 DIAGNOSIS — M542 Cervicalgia: Secondary | ICD-10-CM

## 2023-10-09 DIAGNOSIS — Z79899 Other long term (current) drug therapy: Secondary | ICD-10-CM

## 2023-10-12 ENCOUNTER — Emergency Department (HOSPITAL_COMMUNITY)
Admission: EM | Admit: 2023-10-12 | Discharge: 2023-10-12 | Disposition: A | Discharge status: Home / Self Care | Attending: Emergency Medicine | Admitting: Emergency Medicine

## 2023-10-12 ENCOUNTER — Emergency Department (HOSPITAL_COMMUNITY)

## 2023-10-12 ENCOUNTER — Encounter (HOSPITAL_COMMUNITY): Payer: Self-pay

## 2023-10-12 DIAGNOSIS — Z7951 Long term (current) use of inhaled steroids: Secondary | ICD-10-CM | POA: Diagnosis not present

## 2023-10-12 DIAGNOSIS — E119 Type 2 diabetes mellitus without complications: Secondary | ICD-10-CM | POA: Insufficient documentation

## 2023-10-12 DIAGNOSIS — J45909 Unspecified asthma, uncomplicated: Secondary | ICD-10-CM | POA: Insufficient documentation

## 2023-10-12 DIAGNOSIS — Z4889 Encounter for other specified surgical aftercare: Secondary | ICD-10-CM | POA: Insufficient documentation

## 2023-10-12 DIAGNOSIS — R059 Cough, unspecified: Secondary | ICD-10-CM | POA: Diagnosis present

## 2023-10-12 DIAGNOSIS — Z7984 Long term (current) use of oral hypoglycemic drugs: Secondary | ICD-10-CM | POA: Insufficient documentation

## 2023-10-12 DIAGNOSIS — R051 Acute cough: Secondary | ICD-10-CM | POA: Diagnosis not present

## 2023-10-12 DIAGNOSIS — R52 Pain, unspecified: Secondary | ICD-10-CM

## 2023-10-12 LAB — RESP PANEL BY RT-PCR (RSV, FLU A&B, COVID)  RVPGX2
Influenza A by PCR: NEGATIVE
Influenza B by PCR: NEGATIVE
Resp Syncytial Virus by PCR: NEGATIVE
SARS Coronavirus 2 by RT PCR: NEGATIVE

## 2023-10-12 MED ORDER — OXYCODONE HCL 5 MG PO TABS
15.0000 mg | ORAL_TABLET | Freq: Once | ORAL | Status: AC
  Administered 2023-10-12: 15 mg via ORAL
  Filled 2023-10-12: qty 3

## 2023-10-12 MED ORDER — OXYCODONE HCL 5 MG PO TABS: 5.0000 mg | ORAL_TABLET | ORAL | 0 refills | Status: DC | PRN

## 2023-10-12 NOTE — ED Provider Notes (Signed)
 [place] [facility] AT [facility] Provider Note   CSN: [id number] Arrival date & time: 10/12/23  1517     Patient presents with: Post-op Problem and Cough   Nathan [name] is a 53 y.o. male.   Cough  Patient is a 53 year old male presented ED today with concerns for persistent neck pain post cervical surgery, noted for this to be a chronic pain with no acute worsening however was told by pain management that neurosurgery would increase pain medication, neurosurgery then responded with pain management being on the 1 to increase pain medication.  He then went to go and tried to talk to pain management today for an increase in his pain medication however they were closed at 12 and are not open till Monday.  Patient also noted that he has had a cough that is been productive, concerned he may have COVID.  But denies any new or worsening symptoms otherwise.  At baseline otherwise.  No other complaints.  Denies fever, headache, unilateral weakness, vertigo, vision changes, worsening chest pain, worsening shortness of breath, hemoptysis, abdominal pain, nausea, vomiting, diarrhea, dysuria, lower leg swelling.    Prior to Admission medications   Medication Sig Start Date End Date Taking? Authorizing Provider  oxyCODONE  (ROXICODONE ) 5 MG immediate release tablet Take 1 tablet (5 mg total) by mouth every 4 (four) hours as needed for severe pain (pain score 7-10). 10/12/23  Yes Beola Terrall RAMAN, PA-C  albuterol  (VENTOLIN  HFA) 108 (90 Base) MCG/ACT inhaler Inhale 2 puffs into the lungs every 6 (six) hours as needed for wheezing or shortness of breath. 03/08/22   Ruthell Lauraine FALCON, NP  amitriptyline (ELAVIL) 10 MG tablet Take 10 mg by mouth at bedtime. 02/22/23   [provider]  budeson-glycopyrrolate-formoterol  (BREZTRI  AEROSPHERE) 160-9-4.8 MCG/ACT AERO Inhale 2 puffs into the lungs 2 (two) times daily. 05/16/23   Shelah Lamar RAMAN, MD  celecoxib (CELEBREX) 100 MG capsule  Take 100 mg by mouth 2 (two) times daily as needed (pain.).    [provider]  cetirizine  (ZYRTEC ) 10 MG tablet Take 10 mg by mouth in the morning. 04/05/23 04/04/24  [provider]  Cholecalciferol (VITAMIN D3) 10 MCG (400 UNIT) tablet Take 400 Units by mouth in the morning. 05/22/23   [provider]  cyclobenzaprine  (FLEXERIL ) 10 MG tablet Take 10 mg by mouth in the morning and at bedtime. 05/18/22   [provider]  Docusate Sodium (DSS) 100 MG CAPS Take 100 mg by mouth in the morning and at bedtime. 03/14/23   [provider]  Evolocumab  (REPATHA  SURECLICK) 140 MG/ML SOAJ Inject 140 mg into the skin every 14 (fourteen) days. 06/25/23   Thukkani, Arun K, MD  fluticasone  (FLONASE ) 50 MCG/ACT nasal spray Place 1 spray into both nostrils in the morning. 04/05/23   [provider]  folic acid  (FOLVITE ) 1 MG tablet Take 1 tablet (1 mg total) by mouth daily. 02/28/22   Jeannetta Lonni ORN, MD  furosemide  (LASIX ) 20 MG tablet Take 0.5 tablets (10 mg total) by mouth daily. 04/25/23   Thukkani, Arun K, MD  HYDROcodone -acetaminophen  (NORCO) 10-325 MG tablet Take 1 tablet by mouth every 8 (eight) hours as needed (severe pain). 04/19/23   [provider]  HYDROcodone -acetaminophen  (NORCO) 10-325 MG tablet Take 1-2 tablets by mouth every 4 (four) hours as needed for severe pain (pain score 7-10) or moderate pain (pain score 4-6). 10/03/23   Beuford Anes, MD  metFORMIN (GLUCOPHAGE-XR) 500 MG 24 hr tablet  Take 500 mg by mouth 2 (two) times daily. 08/24/21   [provider]  methotrexate  (RHEUMATREX) 2.5 MG tablet Take 8 tablets (20 mg total) by mouth once a week. Caution:Chemotherapy. Protect from light. Patient taking differently: Take 20 mg by mouth every Wednesday. Caution:Chemotherapy. Protect from light. 06/18/23   Jeannetta Lonni ORN, MD  omeprazole (PRILOSEC) 20 MG capsule Take 20 mg by mouth daily before breakfast. 05/29/22 09/25/26  [provider]  pravastatin (PRAVACHOL) 10 MG tablet Take 10 mg by mouth every evening. 09/14/22   [provider]  predniSONE  (DELTASONE ) 5 MG tablet Take 1 tablet (5 mg total) by mouth daily with breakfast. 06/18/23   Rice, Lonni ORN, MD  pregabalin  (LYRICA ) 200 MG capsule Take 200 mg by mouth in the morning and at bedtime. 07/17/22   [provider]  Semaglutide , 1 MG/DOSE, 4 MG/3ML SOPN Inject 1 mg into the skin once a week. Patient taking differently: Inject 1 mg into the skin every Tuesday. 09/18/23   Thukkani, Arun K, MD  sitaGLIPtin (JANUVIA) 50 MG tablet Take 50 mg by mouth in the morning.    [provider]  sulfamethoxazole -trimethoprim  (BACTRIM  DS) 800-160 MG tablet TAKE 1 TABLET BY MOUTH THREE TIMES WEEKLY (ON MONDAY, WEDNESDAY & FRIDAY) 06/02/23   Darlean Ozell NOVAK, MD  valsartan  (DIOVAN ) 80 MG tablet Take 1 tablet (80 mg total) by mouth daily. Patient taking differently: Take 80 mg by mouth every evening. 04/25/23   Thukkani, Arun K, MD    Allergies: Atorvastatin , Rosuvastatin , Tizanidine , Zetia [ezetimibe], Oxycodone -acetaminophen , and Percocet [oxycodone -acetaminophen ]    Review of Systems  Respiratory:  Positive for cough.   All other systems reviewed and are negative.   Updated Vital Signs BP (!) 133/110   Pulse 98   Temp 99.2 F (37.3 C)   Resp 16   SpO2 100%   Physical Exam Vitals and nursing note reviewed.  Constitutional:      General: He is not in acute distress.    Appearance: Normal appearance. He is not ill-appearing or diaphoretic.  HENT:     Head: Normocephalic and atraumatic.  Eyes:     General: No scleral icterus.       Right eye: No discharge.        Left eye: No discharge.     Extraocular Movements: Extraocular movements intact.     Conjunctiva/sclera: Conjunctivae normal.  Neck:     Comments: Patient noted to be in cervical collar, well-healing surgical site. Cardiovascular:     Rate and Rhythm: Normal rate and regular  rhythm.     Pulses: Normal pulses.     Heart sounds: Normal heart sounds. No murmur heard.    No friction rub. No gallop.  Pulmonary:     Effort: Pulmonary effort is normal. No respiratory distress.     Breath sounds: No stridor. No wheezing, rhonchi or rales.  Chest:     Chest wall: No tenderness.  Abdominal:     General: Abdomen is flat. There is no distension.     Palpations: Abdomen is soft.     Tenderness: There is no abdominal tenderness. There is no right CVA tenderness, left CVA tenderness, guarding or rebound.  Musculoskeletal:        General: No swelling, deformity or signs of injury.     Right lower leg: No edema.     Left lower leg: No edema.  Skin:    General: Skin is warm and dry.     Findings:  No bruising, erythema or lesion.  Neurological:     General: No focal deficit present.     Mental Status: He is alert and oriented to person, place, and time. Mental status is at baseline.     Sensory: No sensory deficit.     Motor: No weakness.  Psychiatric:        Mood and Affect: Mood normal.     (all labs ordered are listed, but only abnormal results are displayed) Labs Reviewed  RESP PANEL BY RT-PCR (RSV, FLU A&B, COVID)  [id number]    EKG: None  Radiology: DG Chest 2 View Result Date: 10/12/2023 CLINICAL DATA:  Cough chest pain EXAM: CHEST - 2 VIEW COMPARISON:  04/28/2023, chest CT 02/04/2023, 08/30/2020 radiograph FINDINGS: Hypoventilatory changes. Bandlike areas of scarring within the lungs. Nodularity in the left upper lobe, similar compared with recent chest CT imaging and presumably related to history of sarcoid. Stable cardiomediastinal silhouette. No acute airspace disease, pleural effusion or pneumothorax. Hardware in the cervical spine IMPRESSION: 1. No active cardiopulmonary disease. 2. Bandlike areas of scarring within the lungs. Nodularity in the left upper lobe, similar compared with recent chest CT imaging and presumably related to history of sarcoid.  Electronically Signed   By: Luke Bun M.D.   On: 10/12/2023 16:38   Procedures   Medications Ordered in the ED  oxyCODONE  (Oxy IR/ROXICODONE ) immediate release tablet 15 mg (15 mg Oral Given 10/12/23 2020)                                Medical Decision Making  This patient is a 53 year old male who presents to the ED for concern of cough and needing pain management after neurosurgery did a cervical fusion.  Was told that neurosurgery would manage pain medication after surgery per pain management however neurosurgery said they could not change pain medications without pain management's approval.  Told him to go and talk to pain management however reported that they closed early on Fridays.  Unable to see them until Monday.  Also noted to have a cough that has been productive, noting these had allergies in the past and that these have been worse recently.  Denies any infectious symptoms of worsening shortness of breath, fever, blood in cough, or any other nausea, vomiting, diarrhea, lower leg swelling.  Chest x-ray shows no acute findings, respiratory panel unremarkable.  Low suspicion for any pneumonia, ACS, PE.  Patient is requesting pain medication and believe that is reasonable, will have additional 5 mg tablet and have him continue to follow-up with pain management for further pain management.  Patient vital signs have remained stable throughout the course of patient's time in the ED. Low suspicion for any other emergent pathology at this time. I believe this patient is safe to be discharged. Provided strict return to ER precautions. Patient expressed agreement and understanding of plan. All questions were answered.  Differential diagnoses prior to evaluation: The emergent differential diagnosis includes, but is not limited to,  CHF, pericardial effusion/tamponade, arrhythmias, ACS, COPD, asthma, bronchitis, pneumonia, pneumothorax, PE, anemia . This is not an exhaustive differential.    Past Medical History / Co-morbidities / Social History: Sarcoidosis, asthma, dyspnea, arthritis, diabetes   Additional history: Chart reviewed. Pertinent results include:   Recent underwent cervical fusion for progressive cervical myelopathy with MRI showing spinal cord compression spanning C4-C5, C5-C6.  Lab Tests/Imaging studies: I personally interpreted labs/imaging and the pertinent results include:  Chest x-ray shows bandlike areas of scarring within the lung but no acute findings. Rester panel remarkable   I agree with the radiologist interpretation.    Medications: I ordered medication including oxycodone .  I have reviewed the patients home medicines and have made adjustments as needed.  Critical Interventions: None  Social Determinants of Health: Notably is having pain meds controlled by pain management.  Disposition: After consideration of the diagnostic results and the patients response to treatment, I feel that the patient would benefit from discharge treatment as above.   emergency department workup does not suggest an emergent condition requiring admission or immediate intervention beyond what has been performed at this time. The plan is: Follow-up with pain management, prescribed short course of oxycodone  and will have him continue to follow-up with neurosurgery, return for new or worsening symptoms. The patient is safe for discharge and has been instructed to return immediately for worsening symptoms, change in symptoms or any other concerns.   Final diagnoses:  Pain management  Acute cough    ED Discharge Orders          Ordered    oxyCODONE  (ROXICODONE ) 5 MG immediate release tablet  Every 4 hours PRN        10/12/23 2007               Beola Terrall GORMAN DEVONNA 10/12/23 2114    Armenta Canning, MD 10/12/23 2316

## 2023-10-12 NOTE — ED Notes (Signed)
..  The patient is A&OX4, ambulatory at d/c with independent steady gait, NAD. Pt verbalized understanding of d/c instructions, prescriptions and follow up care.

## 2023-10-12 NOTE — ED Provider Triage Note (Signed)
 Emergency Medicine Provider Triage Evaluation Note  Nathan [name] , a 53 y.o. male  was evaluated in triage.  Pt complains of productive cough x9 days. Also with rhinorrhea. Patient has not taken any OTC medications for symptoms.   Review of Systems  Positive:  Negative:   Physical Exam  BP 116/87 (BP Location: Right Arm)   Pulse (!) 107   Temp 99.2 F (37.3 C)   Resp 18   SpO2 98%  Gen:   Awake, no distress   Resp:  Normal effort  MSK:   Moves extremities without difficulty  Other:    Medical Decision Making  Medically screening exam initiated at 4:10 PM.  Appropriate orders placed.  Nathan [name] was informed that the remainder of the evaluation will be completed by another provider, this initial triage assessment does not replace that evaluation, and the importance of remaining in the ED until their evaluation is complete.     Hoy Fraction F, [place] 10/12/23 [phone]

## 2023-10-12 NOTE — ED Triage Notes (Signed)
 Pt arrives via POV. Pt reports he had a cervical fusion on the 20th. PT reports since then, he has had a productive cough. Pt arrives AxOx4.

## 2023-10-12 NOTE — Discharge Instructions (Addendum)
 He was seen today for cough as well as pain management.  Due to you not being able to seen pain management today, I am prescribing additional 5 mg oxycodone  tablets to be used on top of your Percocet.  Recommend that you continue to take the 10 mg Percocet every 6-8 hours, with an additional 5 mg as needed as needed.  Do not take more than 15 mg of oxycodone  within a 6-hour span.  You will need to follow-up with your pain management for further management.  Your lab work and imaging otherwise were unremarkable.  Recommend you continue to follow-up with PCP as needed for persistent symptoms.  Return to the ED though if you into any new or worsening symptoms which include fever, worsening shortness of breath, worsening chest pain, lower leg swelling.

## 2023-10-19 ENCOUNTER — Ambulatory Visit: Admitting: Internal Medicine

## 2023-10-23 ENCOUNTER — Telehealth: Payer: Self-pay | Admitting: Pharmacist

## 2023-10-23 NOTE — Telephone Encounter (Signed)
 Called and spoke with patient. He is doing well on Ozempic . He has procedure a few 8/20. Taking his Ozempic  again today. He has 2 weeks left of Ozempic  1mg . I will call him in 2 weeks to see how he is doing and discus dose change.

## 2023-10-29 MED ORDER — SEMAGLUTIDE (1 MG/DOSE) 4 MG/3ML ~~LOC~~ SOPN: 1.0000 mg | PEN_INJECTOR | SUBCUTANEOUS | 0 refills | Status: DC

## 2023-10-29 NOTE — Telephone Encounter (Signed)
 Pt requested refill for Ozmempic 1mg  be [facility]

## 2023-10-29 NOTE — Addendum Note (Signed)
 Addended by: Parke Jandreau D on: 10/29/2023 12:09 PM   Modules accepted: Orders

## 2023-10-31 ENCOUNTER — Ambulatory Visit: Payer: Self-pay | Admitting: Emergency Medicine

## 2023-10-31 NOTE — Telephone Encounter (Signed)
 FYI Only or Action Required?: FYI only for provider.  Patient is followed in Pulmonology for Sarcoidosis, last seen on 09/20/2023 by Shelah Lamar RAMAN, MD.  Called Nurse Triage reporting Shortness of Breath and Eye Problem.  Symptoms began several days ago.  Interventions attempted: Rescue inhaler, Maintenance inhaler, Nebulizer treatments, and Increased fluids/rest.  Symptoms are: unchanged.  Triage Disposition: See Physician Within 24 Hours, See PCP When Office is Open (Within 3 Days)  Patient/caregiver understands and will follow disposition?: Yes Copied from CRM #[id number]. Topic: Clinical - Red Word Triage >> Oct 31, 2023 10:56 AM Leila C wrote: Red Word that prompted transfer to Nurse Triage: Patient [phone] states sarcoidosis is flaring up, the corners of eyes are drying and spreading, itchy, redness, burning sensation, and painful. Patient denies a fever. Patient is currently on Prednisone  and unsure if the dosage needs to be increase. Patient wants to see Dr. Shelah or NP. Please advise. Reason for Disposition  [1] MODERATE longstanding difficulty breathing (e.g., speaks in phrases, SOB even at rest, pulse 100-120) AND [2] SAME as normal  Eye pain present > 24 hours  Answer Assessment - Initial Assessment Questions 1. RESPIRATORY STATUS: Describe your breathing? (e.g., wheezing, shortness of breath, unable to speak, severe coughing)      Shortness of breath 2. ONSET: When did this breathing problem begin?      Chronic shortness of breath 3. PATTERN Does the difficult breathing come and go, or has it been constant since it started?      Comes and goes 4. SEVERITY: How bad is your breathing? (e.g., mild, moderate, severe)      8 out of 10 currently 5. RECURRENT SYMPTOM: Have you had difficulty breathing before? If Yes, ask: When was the last time? and What happened that time?      yes 6. CARDIAC HISTORY: Do you have any history of heart disease? (e.g., heart  attack, angina, bypass surgery, angioplasty)      HTN 7. LUNG HISTORY: Do you have any history of lung disease?  (e.g., pulmonary embolus, asthma, emphysema)     sarcoidosis 8. CAUSE: What do you think is causing the breathing problem?      Concerned that sarcoidosis is flaring up 9. OTHER SYMPTOMS: Do you have any other symptoms? (e.g., chest pain, cough, dizziness, fever, runny nose)     Eye pain with dry eye, burning sensation and redness.  10. O2 SATURATION MONITOR:  Do you use an oxygen saturation monitor (pulse oximeter) at home? If Yes, ask: What is your reading (oxygen level) today? What is your usual oxygen saturation reading? (e.g., 95%)       Patient does have a pulse oximeter-doesn't check his oxygen levels regularly.  12. TRAVEL: Have you traveled out of the country in the last month? (e.g., travel history, exposures)       no  Answer Assessment - Initial Assessment Questions 1. LOCATION: Where is the redness? (e.g., eyeball or outer eyelids) Note: When callers say the eye is red, they usually mean the sclera (white of the eye) is red.       Redness is around the eye area-states symptoms are around the eye happen with possible sarcoidosis flare.  2. ONE OR BOTH EYES: Is the redness in one or both eyes?      Around both eyes 3. ONSET: When did the redness start? (e.g., hours, days)      Noticed the redness started a few days ago 4. EYELIDS: Are the eyelids red  or swollen? If Yes, ask: How much?      no 5. VISION: Do you have blurred vision?     Baseline blurred vision.  6. ITCHING: Does it feel itchy? If so ask: How bad is it (Scale 1-10; or mild, moderate, severe)     Itching-moderate 7. PAIN: Is it painful? If Yes, ask: How bad is the pain? (Scale 0-10; or none, mild, moderate, severe)     7 out of 10 8. CONTACT LENS: Do you wear contacts?     no 9. CAUSE: What do you think is causing the redness?     Flare up from sarcoidosis 10.  OTHER SYMPTOMS: Do you have any other symptoms? (e.g., fever, runny nose, cough, vomiting)       no  Protocols used: Breathing Difficulty-A-AH, Eye - Redness-A-AH

## 2023-10-31 NOTE — Telephone Encounter (Signed)
 Pt already has an appt for tomorrow. NFN

## 2023-11-01 ENCOUNTER — Encounter: Payer: Self-pay | Admitting: Emergency Medicine

## 2023-11-01 ENCOUNTER — Ambulatory Visit: Admitting: Emergency Medicine

## 2023-11-01 VITALS — BP 118/72 | HR 93 | Ht 68.0 in | Wt 239.0 lb

## 2023-11-01 DIAGNOSIS — J45909 Unspecified asthma, uncomplicated: Secondary | ICD-10-CM | POA: Diagnosis not present

## 2023-11-01 DIAGNOSIS — J4520 Mild intermittent asthma, uncomplicated: Secondary | ICD-10-CM

## 2023-11-01 DIAGNOSIS — D863 Sarcoidosis of skin: Secondary | ICD-10-CM | POA: Diagnosis not present

## 2023-11-01 DIAGNOSIS — D860 Sarcoidosis of lung: Secondary | ICD-10-CM | POA: Diagnosis not present

## 2023-11-01 DIAGNOSIS — R0609 Other forms of dyspnea: Secondary | ICD-10-CM | POA: Insufficient documentation

## 2023-11-01 DIAGNOSIS — D573 Sickle-cell trait: Secondary | ICD-10-CM | POA: Insufficient documentation

## 2023-11-01 DIAGNOSIS — E559 Vitamin D deficiency, unspecified: Secondary | ICD-10-CM | POA: Insufficient documentation

## 2023-11-01 DIAGNOSIS — Z87891 Personal history of nicotine dependence: Secondary | ICD-10-CM | POA: Diagnosis not present

## 2023-11-01 DIAGNOSIS — K219 Gastro-esophageal reflux disease without esophagitis: Secondary | ICD-10-CM

## 2023-11-01 MED ORDER — PREDNISONE 20 MG PO TABS: 20.0000 mg | ORAL_TABLET | Freq: Every day | ORAL | 0 refills | Status: DC

## 2023-11-01 NOTE — Assessment & Plan Note (Signed)
 Continue same regimen

## 2023-11-01 NOTE — Progress Notes (Signed)
 Subjective:    Patient ID: Nathan [name], male    DOB: July 25, 1970, 53 y.o.   MRN: [id number]  Cough    ROV 09/20/2023 --follow-up visit for 53 year old gentleman with a history of former tobacco, sarcoidosis with lung and skin involvement.  He has had a reassuring cardiac PET scan.  He has associated significant obstructive disease/COPD and is managed on methotrexate , 5 mg of prednisone , prophylactic Bactrim .  He is also on Breztri , Zyrtec  and uses albuterol  as needed.  He reports that his breathing has been stable. He can walk on flat ground fairly well, able to shop. Difficulty w stairs or hills. He is preparing for a C-spine sgy w Dr Beuford  Pulmonary function testing done 8//25 and reviewed by me show severe mixed obstruction and restriction with a borderline bronchodilator response, restricted lung volumes, decreased diffusion capacity that corrects to the normal range when adjusted for alveolar volume.  Acute office visit 11/01/2023 --Nathan [name] is 53 with a history of former tobacco use and sarcoidosis (lung and skin involvement, reassuring cardiac evaluation).  He has obstructive lung disease, severe mixed obstruction and restriction on his pulmonary function testing.  He has been managed on methotrexate , prednisone  5 mg, prophylactic Bactrim , Breztri , Zyrtec . Today he reports he started to see discolored rash in the skin of the inner eye, has been dry and itchy. He has tried to [employer] it. He was off methotrexate  for a couple doses to get his C-spine sgy. Breathing is doing ok. Asthma doing ok - may have a bit more throat mucous and nasal gtt. denies any visual changes or rash elsewhere   Review of Systems  Respiratory:  Positive for cough.    As per HPI     Objective:   Physical Exam Vitals:   11/01/23 1440  BP: 118/72  Pulse: 93  SpO2: 96%  Weight: 239 lb (108.4 kg)  Height: 5' 8 (1.727 m)   Gen: Pleasant, well-nourished, in no distress,  normal affect  ENT: No  lesions, new periorbital dryness and dark rash, some cracked skin on the right.  No purulence.  Neck: No JVD, no stridor  Lungs: No use of accessory muscles, no crackles or wheezing on normal respiration, no wheeze on forced expiration  Cardiovascular: RRR, heart sounds normal, no murmur or gallops, no peripheral edema  Musculoskeletal: No deformities, no cyanosis or clubbing  Neuro: alert, awake, non focal  Skin: No evidence of rash on the face or upper extremities      Assessment & Plan:  Sarcoidosis of skin He had to come off his methotrexate  for 2 weeks surrounding his C-spine surgery.  Now with evidence of rash forming in the periorbital region around his inner eye and paranasal area.  He has had rash in this region before when his sarcoid was active.  The rash is so close to his eye that I do not feel comfortable using topical hydrocortisone.  I think he needs prednisone .  We will do 2 weeks of an increased dose and then get him back to 5 mg daily.  Hopefully this will be adequate since he is back on his methotrexate .  Please continue your methotrexate  and your Bactrim  We will increase your prednisone  to 20 mg daily for 2 weeks.  Then go back to taking 5 mg daily Follow in our office in 1 month so we can ensure that you are improved.  Sarcoidosis of lung (HCC) Continue your Breztri  2 puffs twice a day.  Rinse and gargle  after using. Continue albuterol  when you need it  GERD (gastroesophageal reflux disease) Continue same regimen  Asthma Bronchodilators as ordered.  No wheezing on exam today.  I believe this is principally cutaneous      Nathan Chris, MD, PhD 11/01/2023, 3:16 PM [facility] [phone] or if no answer [phone]

## 2023-11-01 NOTE — Patient Instructions (Addendum)
 Please continue your methotrexate  and your Bactrim  We will increase your prednisone  to 20 mg daily for 2 weeks.  Then go back to taking 5 mg daily Continue your Breztri  2 puffs twice a day.  Rinse and gargle after using. Continue albuterol  when you need it Continue Zyrtec  Follow in our office in 1 month so we can ensure that you are improved.

## 2023-11-01 NOTE — Assessment & Plan Note (Signed)
 He had to come off his methotrexate  for 2 weeks surrounding his C-spine surgery.  Now with evidence of rash forming in the periorbital region around his inner eye and paranasal area.  He has had rash in this region before when his sarcoid was active.  The rash is so close to his eye that I do not feel comfortable using topical hydrocortisone.  I think he needs prednisone .  We will do 2 weeks of an increased dose and then get him back to 5 mg daily.  Hopefully this will be adequate since he is back on his methotrexate .  Please continue your methotrexate  and your Bactrim  We will increase your prednisone  to 20 mg daily for 2 weeks.  Then go back to taking 5 mg daily Follow in our office in 1 month so we can ensure that you are improved.

## 2023-11-01 NOTE — Assessment & Plan Note (Signed)
 Bronchodilators as ordered.  No wheezing on exam today.  I believe this is principally cutaneous

## 2023-11-01 NOTE — Assessment & Plan Note (Signed)
 Continue your Breztri  2 puffs twice a day.  Rinse and gargle after using. Continue albuterol  when you need it

## 2023-11-12 ENCOUNTER — Ambulatory Visit: Attending: Internal Medicine | Admitting: Internal Medicine

## 2023-11-12 ENCOUNTER — Encounter: Payer: Self-pay | Admitting: Internal Medicine

## 2023-11-12 VITALS — BP 134/96 | HR 85 | Temp 98.5°F | Resp 16 | Ht 68.0 in | Wt 236.4 lb

## 2023-11-12 DIAGNOSIS — D869 Sarcoidosis, unspecified: Secondary | ICD-10-CM | POA: Diagnosis present

## 2023-11-12 DIAGNOSIS — N529 Male erectile dysfunction, unspecified: Secondary | ICD-10-CM | POA: Insufficient documentation

## 2023-11-12 DIAGNOSIS — D860 Sarcoidosis of lung: Secondary | ICD-10-CM | POA: Diagnosis present

## 2023-11-12 DIAGNOSIS — D863 Sarcoidosis of skin: Secondary | ICD-10-CM | POA: Insufficient documentation

## 2023-11-12 DIAGNOSIS — M4802 Spinal stenosis, cervical region: Secondary | ICD-10-CM | POA: Insufficient documentation

## 2023-11-12 DIAGNOSIS — Z79899 Other long term (current) drug therapy: Secondary | ICD-10-CM | POA: Insufficient documentation

## 2023-11-12 MED ORDER — PREDNISONE 5 MG PO TABS: 5.0000 mg | ORAL_TABLET | Freq: Every day | ORAL | 0 refills | Status: DC

## 2023-11-12 MED ORDER — FOLIC ACID 1 MG PO TABS: 1.0000 mg | ORAL_TABLET | Freq: Every day | ORAL | 3 refills | Status: AC

## 2023-11-12 MED ORDER — METHOTREXATE SODIUM 2.5 MG PO TABS: 20.0000 mg | ORAL_TABLET | ORAL | 0 refills | Status: DC

## 2023-11-12 NOTE — Patient Instructions (Signed)
 Consider addition of daily multivitamin also can try Biotin daily for hair and nail growth assistance

## 2023-11-12 NOTE — Progress Notes (Unsigned)
 Office Visit Note  Patient: Nathan [name]             Date of Birth: Apr 08, 1970           MRN: [id number]             PCP: [facility] Referring: [facility] Visit Date: 11/12/2023   Subjective:  No chief complaint on file.   History of Present Illness: Nathan [name] is a 53 y.o. male here for follow up ***   Previous HPI 06/18/23 Nathan [name] is a 53 y.o. male here for follow up  for sarcoidosis with pulmonary, articular, and cutaneous disease involvement currently on methotrexate  20 mg p.o. weekly folic acid  1 mg daily and prednisone  5 mg daily.     His last pulmonary function test showed significant obstruction, and he is scheduled for another test on August 4th. He describes difficulty with air movement due to narrowed or reactive airways, likening it to a 'big bagpipe' with narrowed pipes. He uses inhaler treatments, which are more effective for his obstructive problem. He experiences increased shortness of breath with exertion, such as climbing stairs or walking uphill, and becomes winded more easily than before. His wife noticed a mention of COPD in his medical chart, and he inquires about this diagnosis. He has a history of sarcoidosis, which typically causes restrictive lung problems, but his current issue is more obstructive in nature. He is not currently requiring oxygen therapy or CPAP at night.   He reports ongoing neck pain and is scheduled for another injection on the 12th, though he is unsure of the type of injection. He previously had an x-ray in March due to a pinched nerve, which was persistent and led him to the emergency room.   He is taking Bactrim  on Monday, Wednesday, and Friday due to his medication regimen. He recently had teeth extracted and was on antibiotics for that procedure. He is awaiting fitting for dentures and a partial.   He has been experiencing balance issues and is scheduled to see an ENT  specialist for evaluation of possible vertigo. He was prescribed medication to chew as needed for this issue, but he does not find it effective.   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 His ankles feel tight and sometimes painful, especially in the morning.         Previous HPI 03/21/2023 Nathan [name] is a 53 y.o. male here for follow up for sarcoidosis with pulmonary, articular, and cutaneous disease involvement currently on methotrexate  20 mg p.o. weekly folic acid  1 mg daily and prednisone  5 mg daily.     He experiences ongoing neck and back pain, which he attributes to a pinched nerve. The pain radiates down his arm, affecting his ability to sit or stand for prolonged periods. He has undergone multiple procedures, most recently had spinal epidural injection about a month ago, which provided temporary relief for two days. This was after recent updated MRI showing what sounds like multilevel degenerative disease. He continues to take methotrexate  every Wednesday night and prednisone  daily. No leg swelling or glandular swelling. Pain is present in the neck and shoulders, with radiation down the arm.   He has a history of sarcoidosis, currently managed with methotrexate  and prednisone . The treatment has been ongoing, and he has experienced some improvement in  joint symptoms with low-dose prednisone . He recalls a previous treatment with high-dose prednisone , which led to weight gain.   He reports a skin rash that has been intermittently dry, for which he has been using a prescribed cream. He has not noticed any other rashes besides the one mentioned.   He is concerned about his ability to work due to his symptoms and has filed for disability. He mentions having undergone a functional capacity exam to assess his physical limitations. He recalls an episode from December where  he visited the emergency room due to severe pain from a pinched nerve, during which he was prescribed a medrol  dose pack and muscle relaer. He has not been sick recently.     12/06/2022 Nathan [name] is a 53 y.o. male here for follow up for sarcoidosis with pulmonary, articular, and cutaneous disease involvement currently on methotrexate  20 mg p.o. weekly folic acid  1 mg daily and prednisone  5 mg daily.  He has had persistent pain in his neck and some shooting radiating type pain more down the right arm than down the left arm.  He is experiencing stiffness and numbness in the hands more after prolonged use especially holding a tight grip.  A little bit of pain around the MCP joints of the hand otherwise more just trouble gripping than actual pain.  He had an MRI of the neck yesterday that was not yet read.  Has a follow-up planned with pain management for this.  He had recent labs checked that showed normal renal function.  Saw dermatology and was started with topical medication appears to be triamcinolone 0.1% for use on the ongoing facial rashes.  So far these have faded in color a little bit but not gone away.  He had follow-up with Dr. Shelah no concern of worsening pulmonary status.  He did have to take antibiotics for dental abscess suspected at the right upper molars.   08/29/2022 Nathan [name] is a 53 y.o. male here for follow up for sarcoidosis with pulmonary, articular, and cutaneous disease involvement currently on methotrexate  20 mg p.o. weekly folic acid  1 mg daily and prednisone  5 mg daily.  Skin rashes are doing well since her last visit.  [facility] for ongoing neck and upper back pain with trial of steroid injection to the neck with partial benefit.  He is also been recommended if effectiveness is low for possible trigger point injection at multiple sites in the neck and upper back.  He had increase in cholesterol medication with his PCP onto a  atorvastatin  and ezetimibe noticing some increased muscle pain and cramping so discontinued the ezetimibe for about 1 week.  At that visit on July 8 had an elevated CK level of 662.  Outside of these areas joint pain is doing well and not noticed any significant swelling in his arms or legs.  Still has some persistent cough swallowing difficulty and shortness of breath with exertion plans for upcoming repeat CT scan and pulmonology follow-up.  He is also been referred for seeing gastroenterology based on previous findings of laryngeal pharyngeal reflux disease.   05/30/22 Nathan [name] is a 53 y.o. male here for follow up for sarcoidosis with pulmonary articular and cutaneous disease involvement currently on methotrexate  20 mg p.o. weekly folic acid  1 mg daily and prednisone  5 mg daily.  Rash is somewhat improved more since her last visit.  Follow-up with pulmonology clinic not clear whether there is active  disease plan for follow-up this summer with repeat lung study to assess this.  He saw the ENT doctor noticed excessive mucus on exam suspicious for reflux for his upper airway cough syndrome issue.  He saw spine specialist for his neck and upper back pain initially started low-dose Lyrica  and tramadol  while getting outside records and workup and has follow-up scheduled later this week.   02/28/22 Nathan [name] is a 53 y.o. male here for follow up for sarcoidosis with pulmonary and cutaneous involvement on methotrexate  15 mg p.o. weekly and folic acid  1 mg daily.  Since her last visit he had PET scan that looked good no evidence of cardiac sarcoidosis activity.  CT scan in October with stable lymphadenopathy and bandlike and nodular opacities in the lungs, although the associated nondiagnostic CT in December was consistent with hypermetabolic mediastinal and hilar lymphadenopathy and bilateral pulmonary parenchymal opacities consistent with inflammatory process and active granulomatous disease.  He  is tolerating the methotrexate  without any difficulty.  Still been noticing some ongoing skin rash with minimally raised hyperpigmented spots along the medial border of the eyes.  He has not felt a significant improvement with his neck pain on the medication.  Still feels restriction in his range of motion especially looking towards the right and gets pain painful experiences towards the left side.   12/19/21 Nathan [name] is a 53 y.o. male here for follow up for joint pains with history of pulmonary and cutaneous sacoidosis now on methotrexate  15 mg PO weekly and folic acid  1 mg daily. He has not noticed any problems with the medication but also no improvement in symptoms so far. Still pain worst in the neck and upper back with radiation into back and with headaches. Asthma symptoms doing somewhat worse this year. He had cardiac MRI but is now recommended for cardiac PET scan to evaluate but not scheduled yet.   Previous HPI 11/15/2021 Nathan [name] is a 53 y.o. male here for joint pains with history of pulmonary and cutaneous sacoidosis.  His worst affected area is pretty constant pain in the base of the neck area frequently with radiation up the neck and posterior headaches associated with this.  He has had problems going on for years but seems to be progressively getting somewhat worse.  He does get some pain in extremities elsewhere but by far the most limiting for him is in the base of the neck.  He has tried multiple NSAIDs including meloxicam  and Celebrex is also tried muscle relaxant medications none of which were very significantly beneficial.  He is tried physical therapy completing courses and had local injections that did not greatly relieve symptoms.  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  He had most recent neck MRI in 2022 based on findings had discussed surgery options but is looking to avoid this due to concern about complications or long-term needing additional  revision. Original diagnosis of sarcoidosis was based on pulmonary imaging finding and treated with very prolonged prednisone  taper.  Initially only had respiratory symptoms with some mild dyspnea and coughing that improved with the steroid treatments.  More recently this year he had an episode of similar symptoms coming back and was treated with a round of steroids from pulmonology clinic in noticed a increase in blood glucose up to 400s.  He is also developed some new skin rashes on the face previously had involvement on extremities but no facial rashes with original symptoms.  He is prescribed topical  medication by Dr. Livingston but has not seen any difference so far.   No Rheumatology ROS completed.   PMFS History:  Patient Active Problem List   Diagnosis Date Noted   Chronic dyspnea 11/01/2023   Sickle cell trait 11/01/2023   Vitamin D  deficiency 11/01/2023   Sarcoidosis of skin 11/01/2023   Hypotestosteronemia 08/27/2023   Type 2 diabetes mellitus without complication, without long-term current use of insulin  (HCC) 06/22/2023   Erectile dysfunction 06/19/2023   Chronic pain of multiple joints 01/10/2023   Myalgia 08/29/2022   LPRD (laryngopharyngeal reflux disease) 05/29/2022   Pharyngoesophageal dysphagia 05/29/2022   Spinal stenosis, cervical region 11/25/2021   High risk medication use 11/15/2021   HLD (hyperlipidemia) 09/13/2021   Asthma 11/10/2020   Neuropathy 08/10/2020   MDD (major depressive disorder) 08/05/2020   GAD (generalized anxiety disorder) 08/05/2020   Panic disorder 08/05/2020   GERD (gastroesophageal reflux disease) 06/23/2020   Seasonal allergies 06/23/2020   Shoulder pain, left 10/23/2016   Cervical disc disease 06/14/2016   Upper airway cough syndrome 07/23/2014   Sarcoidosis of lung 03/06/2011    Past Medical History:  Diagnosis Date   Acid reflux disease    Arthritis    Asthma    'I think I might have asthma   Chronic back pain    Diabetes  mellitus without complication (HCC)    Dyspnea    chronic   Hyperlipidemia    Hypertension    Sarcoidosis     Family History  Problem Relation Age of Onset   Hypertension Mother    Diabetes Mother    Heart attack Father    Diabetes Father    Hypertension Father    Gout Father    Colon polyps Sister    Heart disease Paternal Uncle    Prostate cancer Paternal Uncle    Heart disease Maternal Grandfather    Anemia Daughter    Anemia Daughter    Colon cancer Neg Hx    Esophageal cancer Neg Hx    Rectal cancer Neg Hx    Stomach cancer Neg Hx    Past Surgical History:  Procedure Laterality Date   ANTERIOR CERVICAL DECOMP/DISCECTOMY FUSION N/A 10/03/2023   Procedure: ANTERIOR CERVICAL DECOMPRESSION/DISCECTOMY FUSION 2 LEVELS;  Surgeon: Beuford Anes, MD;  Location: [facility] OR;  Service: Orthopedics;  Laterality: N/A;  ANTERIOR CERVICAL DECOMPRESSION FUSION CERVICAL 4- CERVICAL 5, CERVICAL 5- CERVICAL 6 WITH INSTRUMENTATION AND ALLOGRAFT   COLONOSCOPY     DENTAL SURGERY     NO PAST SURGERIES     Social History   Social History Narrative   Not on file   Immunization History  Administered Date(s) Administered   Dtap, Unspecified 11/09/1972, 01/07/1973, 03/01/1973, 03/24/1974, 12/06/1977   Hepatitis B, ADULT 08/27/2023   Influenza, Seasonal, Injecte, Preservative Fre 11/06/2022   Influenza,inj,Quad PF,6+ Mos 11/10/2020, 02/16/2022   Measles 09/23/1973   Moderna Covid-19 Fall Seasonal Vaccine 8yrs & older 11/06/2022   Mumps 03/24/1974   PFIZER Comirnaty(Gray Top)Covid-19 Tri-Sucrose Vaccine 10/28/2020   PFIZER(Purple Top)SARS-COV-2 Vaccination 10/27/2020   PNEUMOCOCCAL CONJUGATE-20 02/16/2022   Polio, Unspecified 11/09/1972, 01/07/1973, 03/11/1973, 03/24/1974, 12/06/1977   Rubella 09/23/1973   Tdap 02/16/2022     Objective: Vital Signs: There were no vitals taken for this visit.   Physical Exam   Musculoskeletal Exam: ***  CDAI Exam: CDAI Score: -- Patient Global: --;  Provider Global: -- Swollen: --; Tender: -- Joint Exam 11/12/2023   No joint exam has been documented for this visit   There  is currently no information documented on the homunculus. Go to the Rheumatology activity and complete the homunculus joint exam.  Investigation: No additional findings.  Imaging: No results found.  Recent Labs: Lab Results  Component Value Date   WBC 4.9 10/03/2023   HGB 12.5 (L) 10/03/2023   PLT 295 10/03/2023   NA 137 09/27/2023   K 3.7 09/27/2023   CL 99 09/27/2023   CO2 27 09/27/2023   GLUCOSE 101 (H) 09/27/2023   BUN 9 09/27/2023   CREATININE 0.74 09/27/2023   BILITOT 0.6 06/18/2023   ALKPHOS 62 04/28/2023   AST 27 06/18/2023   ALT 25 06/18/2023   PROT 7.1 06/18/2023   ALBUMIN 4.5 04/28/2023   CALCIUM  8.8 (L) 09/27/2023   GFRAA >60 09/17/2019    Speciality Comments: No specialty comments available.  Procedures:  No procedures performed Allergies: Atorvastatin , Rosuvastatin , Tizanidine , Zetia [ezetimibe], Oxycodone -acetaminophen , and Percocet [oxycodone -acetaminophen ]   Assessment / Plan:     Visit Diagnoses: No diagnosis found.  ***  Orders: No orders of the defined types were placed in this encounter.  No orders of the defined types were placed in this encounter.    Follow-Up Instructions: No follow-ups on file.   Lonni LELON Ester, MD  Note - This record has been created using [employer].  Chart creation errors have been sought, but may not always  have been located. Such creation errors do not reflect on  the standard of medical care.

## 2023-11-13 ENCOUNTER — Telehealth: Payer: Self-pay | Admitting: Pharmacy Technician

## 2023-11-13 LAB — CBC WITH DIFFERENTIAL/PLATELET
Absolute Lymphocytes: 1287 {cells}/uL (ref 850–3900)
Absolute Monocytes: 988 {cells}/uL — ABNORMAL HIGH (ref 200–950)
Basophils Absolute: 33 {cells}/uL (ref 0–200)
Basophils Relative: 0.4 %
Eosinophils Absolute: 208 {cells}/uL (ref 15–500)
Eosinophils Relative: 2.5 %
HCT: 39.8 % (ref 38.5–50.0)
Hemoglobin: 12.9 g/dL — ABNORMAL LOW (ref 13.2–17.1)
MCH: 28.6 pg (ref 27.0–33.0)
MCHC: 32.4 g/dL (ref 32.0–36.0)
MCV: 88.2 fL (ref 80.0–100.0)
MPV: 9.7 fL (ref 7.5–12.5)
Monocytes Relative: 11.9 %
Neutro Abs: 5785 {cells}/uL (ref 1500–7800)
Neutrophils Relative %: 69.7 %
Platelets: 286 Thousand/uL (ref 140–400)
RBC: 4.51 Million/uL (ref 4.20–5.80)
RDW: 13.6 % (ref 11.0–15.0)
Total Lymphocyte: 15.5 %
WBC: 8.3 Thousand/uL (ref 3.8–10.8)

## 2023-11-13 LAB — COMPREHENSIVE METABOLIC PANEL WITH GFR
AG Ratio: 1.8 (calc) (ref 1.0–2.5)
ALT: 22 U/L (ref 9–46)
AST: 17 U/L (ref 10–35)
Albumin: 4.5 g/dL (ref 3.6–5.1)
Alkaline phosphatase (APISO): 73 U/L (ref 35–144)
BUN/Creatinine Ratio: 20 (calc) (ref 6–22)
BUN: 12 mg/dL (ref 7–25)
CO2: 32 mmol/L (ref 20–32)
Calcium: 9.1 mg/dL (ref 8.6–10.3)
Chloride: 100 mmol/L (ref 98–110)
Creat: 0.61 mg/dL — ABNORMAL LOW (ref 0.70–1.30)
Globulin: 2.5 g/dL (ref 1.9–3.7)
Glucose, Bld: 106 mg/dL — ABNORMAL HIGH (ref 65–99)
Potassium: 3.8 mmol/L (ref 3.5–5.3)
Sodium: 140 mmol/L (ref 135–146)
Total Bilirubin: 0.6 mg/dL (ref 0.2–1.2)
Total Protein: 7 g/dL (ref 6.1–8.1)
eGFR: 116 mL/min/1.73m2 (ref >=60)

## 2023-11-13 LAB — SEDIMENTATION RATE: Sed Rate: 9 mm/h (ref 0–20)

## 2023-11-13 NOTE — Telephone Encounter (Signed)
 Pharmacy Patient Advocate Encounter  Received notification from [facility] MEDICAID that Prior Authorization for REPATHA  has been APPROVED from 11/13/23 to 11/12/24   PA #/Case ID/Reference #: [id number]

## 2023-11-13 NOTE — Telephone Encounter (Signed)
   Pharmacy Patient Advocate Encounter   Received notification from CoverMyMeds that prior authorization for REPATHA  is required/requested.   Insurance verification completed.   The patient is insured through [facility] MEDICAID .   Per test claim: PA required; PA submitted to above mentioned insurance via Latent Key/confirmation #/EOC [id number] Status is pending

## 2023-11-20 ENCOUNTER — Telehealth: Payer: Self-pay

## 2023-11-20 NOTE — Telephone Encounter (Signed)
 Copied from CRM #[id number]. Topic: Clinical - Medication Question >> Nov 16, 2023 12:32 PM Rozanna G wrote: Reason for CRM: Pt wants to know if he can take Morphine  15mg  that was prescribed by his pain management doctor. He said he is on the way to another appt if possible leave a detail message please. If not please do it on my chart.   ATC x1. No answer- unable to leave vm.  Sending Mychart

## 2023-12-05 ENCOUNTER — Encounter (HOSPITAL_BASED_OUTPATIENT_CLINIC_OR_DEPARTMENT_OTHER): Payer: Self-pay | Admitting: Emergency Medicine

## 2023-12-05 ENCOUNTER — Other Ambulatory Visit: Payer: Self-pay

## 2023-12-05 ENCOUNTER — Emergency Department (HOSPITAL_BASED_OUTPATIENT_CLINIC_OR_DEPARTMENT_OTHER)
Admission: EM | Admit: 2023-12-05 | Discharge: 2023-12-05 | Disposition: A | Discharge status: Home / Self Care | Attending: Emergency Medicine | Admitting: Emergency Medicine

## 2023-12-05 DIAGNOSIS — M545 Low back pain, unspecified: Secondary | ICD-10-CM | POA: Insufficient documentation

## 2023-12-05 DIAGNOSIS — M549 Dorsalgia, unspecified: Secondary | ICD-10-CM

## 2023-12-05 DIAGNOSIS — G8929 Other chronic pain: Secondary | ICD-10-CM | POA: Diagnosis not present

## 2023-12-05 MED ORDER — KETOROLAC TROMETHAMINE 30 MG/ML IJ SOLN
15.0000 mg | Freq: Once | INTRAMUSCULAR | Status: AC
  Administered 2023-12-05: 15 mg via INTRAMUSCULAR
  Filled 2023-12-05: qty 1

## 2023-12-05 NOTE — ED Provider Notes (Signed)
 Nathan [name] Provider Note   CSN: [id number] Arrival date & time: 12/05/23  9091     Patient presents with: Back Pain   Nathan [name] is a 53 y.o. male.  With a history of status post ACDF (C4-5 and C5-6), chronic back pain, sarcoidosis, and lumbar disc herniation who presents to the ED for back pain.  3 days of acute on chronic lower back pain.  No specific inciting injuries or trauma.  Pain made worse with ambulation and sitting or standing upright.  No fevers chills fecal incontinence urinary incontinence urinary retention, focal weakness recent instrumentation.  He is followed by a pain specialist.    Back Pain      Prior to Admission medications   Medication Sig Start Date End Date Taking? Authorizing Provider  JANUVIA 50 MG tablet Take 50 mg by mouth daily. 11/27/23  Yes [provider]  morphine  (MSIR) 15 MG tablet Take 15 mg by mouth 4 (four) times daily as needed. 11/15/23  Yes [provider]  albuterol  (VENTOLIN  HFA) 108 (90 Base) MCG/ACT inhaler Inhale 2 puffs into the lungs every 6 (six) hours as needed for wheezing or shortness of breath. 03/08/22   Nathan Lauraine FALCON, NP  amitriptyline (ELAVIL) 10 MG tablet Take 10 mg by mouth at bedtime. 02/22/23   [provider]  budeson-glycopyrrolate-formoterol  (BREZTRI  AEROSPHERE) 160-9-4.8 MCG/ACT AERO Inhale 2 puffs into the lungs 2 (two) times daily. 05/16/23   Nathan Lamar RAMAN, MD  celecoxib (CELEBREX) 100 MG capsule Take 100 mg by mouth 2 (two) times daily as needed (pain.).    [provider]  cetirizine  (ZYRTEC ) 10 MG tablet Take 10 mg by mouth in the morning. 04/05/23 04/04/24  [provider]  Cholecalciferol (VITAMIN D3) 10 MCG (400 UNIT) tablet Take 400 Units by mouth in the morning. 05/22/23   [provider]  cyclobenzaprine  (FLEXERIL ) 10 MG tablet Take 10 mg by mouth in the morning and at bedtime. 05/18/22   [provider]   Docusate Sodium (DSS) 100 MG CAPS Take 100 mg by mouth in the morning and at bedtime. 03/14/23   [provider]  Evolocumab  (REPATHA  SURECLICK) 140 MG/ML SOAJ Inject 140 mg into the skin every 14 (fourteen) days. 06/25/23   Thukkani, Arun K, MD  fluticasone  (FLONASE ) 50 MCG/ACT nasal spray Place 1 spray into both nostrils in the morning. 04/05/23   [provider]  folic acid  (FOLVITE ) 1 MG tablet Take 1 tablet (1 mg total) by mouth daily. 11/12/23   Jeannetta Nathan ORN, MD  furosemide  (LASIX ) 20 MG tablet Take 0.5 tablets (10 mg total) by mouth daily. 04/25/23   Thukkani, Arun K, MD  hydrocortisone 2.5 % ointment Apply 1 g topically 2 (two) times daily. 10/14/23   [provider]  metFORMIN (GLUCOPHAGE-XR) 500 MG 24 hr tablet Take 500 mg by mouth 2 (two) times daily. 08/24/21   [provider]  methotrexate  (RHEUMATREX) 2.5 MG tablet Take 8 tablets (20 mg total) by mouth once a week. Caution:Chemotherapy. Protect from light. 11/12/23   Rice, Nathan ORN, MD  omeprazole (PRILOSEC) 20 MG capsule Take 20 mg by mouth daily before breakfast. 05/29/22 09/25/26  [provider]  oxyCODONE -acetaminophen  (PERCOCET) 7.5-325 MG tablet Take 1 tablet by mouth 4 (four) times daily as needed. 10/18/23   [provider]  pravastatin (PRAVACHOL) 10 MG tablet Take 10 mg by mouth every evening. 09/14/22   [provider]  predniSONE  (DELTASONE ) 20  MG tablet Take 1 tablet (20 mg total) by mouth daily with breakfast. 11/01/23   Nathan Lamar RAMAN, MD  predniSONE  (DELTASONE ) 5 MG tablet Take 1 tablet (5 mg total) by mouth daily with breakfast. 11/12/23   Jeannetta Nathan ORN, MD  pregabalin  (LYRICA ) 200 MG capsule Take 200 mg by mouth in the morning and at bedtime. 07/17/22   [provider]  Semaglutide , 1 MG/DOSE, 4 MG/3ML SOPN Inject 1 mg into the skin once a week. 10/29/23   Thukkani, Arun K, MD  sulfamethoxazole -trimethoprim  (BACTRIM  DS) 800-160 MG tablet TAKE 1  TABLET BY MOUTH THREE TIMES WEEKLY (ON MONDAY, WEDNESDAY & FRIDAY) Patient taking differently: Take 1 tablet by mouth 3 (three) times a week. 06/02/23   Nathan Ozell NOVAK, MD  valsartan  (DIOVAN ) 80 MG tablet Take 1 tablet (80 mg total) by mouth daily. Patient taking differently: Take 80 mg by mouth every evening. 04/25/23   Thukkani, Arun K, MD    Allergies: Atorvastatin , Rosuvastatin , Tizanidine , Zetia [ezetimibe], Oxycodone -acetaminophen , and Percocet [oxycodone -acetaminophen ]    Review of Systems  Musculoskeletal:  Positive for back pain.    Updated Vital Signs BP (!) 124/93 (BP Location: Right Arm)   Pulse 83   Temp 98.2 F (36.8 C)   Resp 16   Wt 106.6 kg   SpO2 98%   BMI 35.73 kg/m   Physical Exam Vitals and nursing note reviewed.  HENT:     Head: Normocephalic and atraumatic.  Eyes:     Pupils: Pupils are equal, round, and reactive to light.  Cardiovascular:     Rate and Rhythm: Normal rate and regular rhythm.  Pulmonary:     Effort: Pulmonary effort is normal.     Breath sounds: Normal breath sounds.  Abdominal:     Palpations: Abdomen is soft.     Tenderness: There is no abdominal tenderness.  Musculoskeletal:     Comments: 5 out of 5 motor strength bilateral lower extremities sensation intact to light touch throughout 2+ DP pulses bilaterally Midline and paraspinal lumbar tenderness  Skin:    General: Skin is warm and dry.  Neurological:     Mental Status: He is alert.  Psychiatric:        Mood and Affect: Mood normal.     (all labs ordered are listed, but only abnormal results are displayed) Labs Reviewed - No data to display  EKG: None  Radiology: No results found.   Procedures   Medications Ordered in the ED  ketorolac  (TORADOL ) 30 MG/ML injection 15 mg (15 mg Intramuscular Given 12/05/23 1124)                                    Medical Decision Making 53 year old male with history as above presenting for acute on chronic back pain.   Benign physical exam with no red flag symptoms that would warrant imaging at this time.  Has a history of known disc disease and herniation lumbar region.  Is followed by pain specialist.  Reports prior relief from Toradol .  He is already on opioids, Flexeril , Celebrex at home.  Will give him a dose of Toradol  here and instruct for pain specialty follow-up  Risk Prescription drug management.        Final diagnoses:  Acute on chronic back pain    ED Discharge Orders     None          Pamella Ozell LABOR,  DO 12/05/23 1145

## 2023-12-05 NOTE — Discharge Instructions (Signed)
 You were seen in the emergency department for worsening of your chronic back pain There is no need for CAT scan or MRI today We gave you dose of Toradol  which help with the pain and inflammation Continue taking previously prescribed medications for chronic back pain at home Follow-up with as scheduled with your pain specialist next week Return to the Emergency Department for severe pain if you are unable to walk, have urinary or bowel continence or have any other concerns

## 2023-12-05 NOTE — ED Triage Notes (Signed)
 Pt c/o lower back pain x 3 days. Denies dysuria. Endorses difficulty standing and sitting. No known injury. Hx of same

## 2023-12-13 ENCOUNTER — Ambulatory Visit (INDEPENDENT_AMBULATORY_CARE_PROVIDER_SITE_OTHER): Admitting: Primary Care

## 2023-12-13 ENCOUNTER — Encounter: Payer: Self-pay | Admitting: Primary Care

## 2023-12-13 VITALS — BP 126/70 | HR 88 | Temp 98.0°F | Ht 68.0 in | Wt 238.8 lb

## 2023-12-13 DIAGNOSIS — J45909 Unspecified asthma, uncomplicated: Secondary | ICD-10-CM | POA: Diagnosis not present

## 2023-12-13 DIAGNOSIS — D860 Sarcoidosis of lung: Secondary | ICD-10-CM

## 2023-12-13 MED ORDER — ALBUTEROL SULFATE (2.5 MG/3ML) 0.083% IN NEBU: 2.5000 mg | INHALATION_SOLUTION | Freq: Four times a day (QID) | RESPIRATORY_TRACT | 2 refills | Status: AC | PRN

## 2023-12-13 NOTE — Patient Instructions (Addendum)
  VISIT SUMMARY: You had a follow-up appointment to discuss your asthma, sarcoidosis, and COPD. You reported difficulty breathing with minimal exertion, chest tightness, and a rash around your eyes that has since cleared up. Your medications were reviewed and adjusted as needed.  YOUR PLAN: -ASTHMA-COPD OVERLAP SYNDROME: This condition involves severe obstructive lung disease due to a combination of asthma and COPD, often worsened by a history of smoking. Continue using Breztri  as prescribed, and you are now prescribed an albuterol  nebulizer to use every six hours as needed for acute breathing difficulties. Avoid inclines and stairs to prevent symptom exacerbation.  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 Continue taking methotrexate  20 mg weekly and prednisone  5 mg daily as prescribed.  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 Continue taking prednisone  5 mg daily and use hydrocortisone ointment on the nasal rash, avoiding the area around your eyes. Monitor for any flare-ups and adjust treatment as needed.  INSTRUCTIONS: Please follow up as needed if you experience any worsening of symptoms or new issues. Continue with your current medications and use the albuterol  nebulizer as directed for acute symptoms.  Orders: New nebulizer start with albuterol  every 6 hours re: ASTHMA  Follow-up 3-4 months with Dr. Byrum

## 2023-12-13 NOTE — Progress Notes (Signed)
 @Patient  ID: Nathan [name], male    DOB: 1970-09-06, 53 y.o.   MRN: [id number]  Chief Complaint  Patient presents with   Asthma    Referring provider: [facility]  HPI: 53 year old male, former smoker. PMH significant for asthma, LPRD, sarcoidosis, GERD, type 2 diabetes, HLD, seasonal allergies.   Previous [facility] pulmonary encounter:  Acute office visit 11/01/2023 --Nathan [name] is 15 with a history of former tobacco use and sarcoidosis (lung and skin involvement, reassuring cardiac evaluation).  Nathan [name] has obstructive lung disease, severe mixed obstruction and restriction on his pulmonary function testing.  Nathan [name] has been managed on methotrexate , prednisone  5 mg, prophylactic Bactrim , Breztri , Zyrtec . Today Nathan [name] reports Nathan [name] started to see discolored rash in the skin of the inner eye, has been dry and itchy. Nathan [name] has tried to [employer] it. Nathan [name] was off methotrexate  for a couple doses to get his C-spine sgy. Breathing is doing ok. Asthma doing ok - may have a bit more throat mucous and nasal gtt. denies any visual changes or rash elsewhere   12/13/2023- Interim hx  Discussed the use of AI scribe software for clinical note transcription with the patient, who gave verbal consent to proceed. History of Present Illness Nathan [name] is a 53 year old male with asthma, sarcoidosis, and COPD who presents for a one month follow-up.  Nathan [name] experiences difficulty breathing triggered by minimal exertion, such as walking short distances or moving quickly. Symptoms fluctuate with activity level, and Nathan [name] avoids inclines and stairs to prevent exacerbation. Nathan [name] experiences chest tightness and pressure in the center of his back during symptom flares, making breathing difficult.  Nathan [name] has a history of sarcoidosis with pulmonary and cutaneous manifestations. Methotrexate  was paused due to spinal surgery in August but has since been resumed. During the methotrexate  hiatus, Nathan [name] developed a rash around his eyes, which  improved with an increased dose of prednisone . Nathan [name] is currently on methotrexate  20 mg weekly and prednisone  5 mg daily. The rash around his eyes has cleared up since resuming methotrexate  and increasing prednisone  temporarily.  Nathan [name] is a former smoker, having quit in 2012. Nathan [name] is currently using Breztri  for asthma/COPD management, taking two pumps in the morning and two at night. Nathan [name] also uses a rescue inhaler as needed and has been prescribed Bactrim  to prevent infections due to his multiple medications, taking it on Monday, Wednesday, and Friday.  Allergies  Allergen Reactions   Atorvastatin      Hand cramping on 10-20mg  daily   Rosuvastatin      Hand cramping on 5mg  daily   Tizanidine  Itching   Zetia [Ezetimibe]     Hand cramping   Oxycodone -Acetaminophen  Itching   Percocet [Oxycodone -Acetaminophen ] Itching    Able to take tylenol     Immunization History  Administered Date(s) Administered   Dtap, Unspecified 11/09/1972, 01/07/1973, 03/01/1973, 03/24/1974, 12/06/1977   Hepatitis B, ADULT 08/27/2023   Influenza, Seasonal, Injecte, Preservative Fre 11/06/2022, 11/12/2023   Influenza,inj,Quad PF,6+ Mos 11/10/2020, 02/16/2022   Measles 09/23/1973   Moderna Covid-19 Fall Seasonal Vaccine 62yrs & older 11/06/2022   Mumps 03/24/1974   PFIZER Comirnaty(Gray Top)Covid-19 Tri-Sucrose Vaccine 10/28/2020   PFIZER(Purple Top)SARS-COV-2 Vaccination 10/27/2020   PNEUMOCOCCAL CONJUGATE-20 02/16/2022   Polio, Unspecified 11/09/1972, 01/07/1973, 03/11/1973, 03/24/1974, 12/06/1977   Rubella 09/23/1973   Tdap 02/16/2022    Past Medical History:  Diagnosis Date   Acid reflux disease    Arthritis    Asthma    'I think I might have asthma  Chronic back pain    Diabetes mellitus without complication (HCC)    Dyspnea    chronic   Hyperlipidemia    Hypertension    Sarcoidosis     Tobacco History: Social History   Tobacco Use  Smoking Status Former   Current packs/day: 0.00   Average  packs/day: 1 pack/day for 20.0 years (20.0 ttl pk-yrs)   Types: Cigarettes   Start date: 01/13/1991   Quit date: 01/13/2011   Years since quitting: 12.9   Passive exposure: Past  Smokeless Tobacco Never   Counseling given: Not Answered   Outpatient Medications Prior to Visit  Medication Sig Dispense Refill   albuterol  (VENTOLIN  HFA) 108 (90 Base) MCG/ACT inhaler Inhale 2 puffs into the lungs every 6 (six) hours as needed for wheezing or shortness of breath. 18 g 6   amitriptyline (ELAVIL) 10 MG tablet Take 10 mg by mouth at bedtime.     budeson-glycopyrrolate-formoterol  (BREZTRI  AEROSPHERE) 160-9-4.8 MCG/ACT AERO Inhale 2 puffs into the lungs 2 (two) times daily. 10.7 g 3   celecoxib (CELEBREX) 100 MG capsule Take 100 mg by mouth 2 (two) times daily as needed (pain.).     cetirizine  (ZYRTEC ) 10 MG tablet Take 10 mg by mouth in the morning.     Cholecalciferol (VITAMIN D3) 10 MCG (400 UNIT) tablet Take 400 Units by mouth in the morning.     cyclobenzaprine  (FLEXERIL ) 10 MG tablet Take 10 mg by mouth in the morning and at bedtime.     Docusate Sodium (DSS) 100 MG CAPS Take 100 mg by mouth in the morning and at bedtime.     Evolocumab  (REPATHA  SURECLICK) 140 MG/ML SOAJ Inject 140 mg into the skin every 14 (fourteen) days. 2 mL 11   fluticasone  (FLONASE ) 50 MCG/ACT nasal spray Place 1 spray into both nostrils in the morning.     folic acid  (FOLVITE ) 1 MG tablet Take 1 tablet (1 mg total) by mouth daily. 90 tablet 3   furosemide  (LASIX ) 20 MG tablet Take 0.5 tablets (10 mg total) by mouth daily. 45 tablet 3   hydrocortisone 2.5 % ointment Apply 1 g topically 2 (two) times daily.     JANUVIA 50 MG tablet Take 50 mg by mouth daily.     metFORMIN (GLUCOPHAGE-XR) 500 MG 24 hr tablet Take 500 mg by mouth 2 (two) times daily.     methotrexate  (RHEUMATREX) 2.5 MG tablet Take 8 tablets (20 mg total) by mouth once a week. Caution:Chemotherapy. Protect from light. 104 tablet 0   morphine  (MSIR) 15 MG  tablet Take 15 mg by mouth 4 (four) times daily as needed.     omeprazole (PRILOSEC) 20 MG capsule Take 20 mg by mouth daily before breakfast.     pravastatin (PRAVACHOL) 10 MG tablet Take 10 mg by mouth every evening.     predniSONE  (DELTASONE ) 5 MG tablet Take 1 tablet (5 mg total) by mouth daily with breakfast. 90 tablet 0   pregabalin  (LYRICA ) 200 MG capsule Take 200 mg by mouth in the morning and at bedtime.     Semaglutide , 1 MG/DOSE, 4 MG/3ML SOPN Inject 1 mg into the skin once a week. 3 mL 0   sulfamethoxazole -trimethoprim  (BACTRIM  DS) 800-160 MG tablet TAKE 1 TABLET BY MOUTH THREE TIMES WEEKLY (ON MONDAY, WEDNESDAY & FRIDAY) (Patient taking differently: Take 1 tablet by mouth 3 (three) times a week.) 12 tablet 11   valsartan  (DIOVAN ) 80 MG tablet Take 1 tablet (80 mg total) by mouth daily. (  Patient taking differently: Take 80 mg by mouth every evening.) 90 tablet 3   oxyCODONE -acetaminophen  (PERCOCET) 7.5-325 MG tablet Take 1 tablet by mouth 4 (four) times daily as needed. (Patient not taking: Reported on 12/13/2023)     predniSONE  (DELTASONE ) 20 MG tablet Take 1 tablet (20 mg total) by mouth daily with breakfast. (Patient not taking: Reported on 12/13/2023) 14 tablet 0   No facility-administered medications prior to visit.   Review of Systems  Review of Systems  Constitutional: Negative.   Respiratory: Negative.    Skin:  Negative for rash.   Physical Exam  BP 126/70   Pulse 88   Temp 98 F (36.7 C)   Ht 5' 8 (1.727 m)   Wt 238 lb 12.8 oz (108.3 kg)   SpO2 96% Comment: ra  BMI 36.31 kg/m  Physical Exam Constitutional:      General: Nathan [name] is not in acute distress.    Appearance: Normal appearance. Nathan [name] is well-developed. Nathan [name] is not ill-appearing.  HENT:     Head: Normocephalic and atraumatic.     Mouth/Throat:     Mouth: Mucous membranes are moist.     Pharynx: Oropharynx is clear.  Cardiovascular:     Rate and Rhythm: Normal rate and regular rhythm.     Heart sounds:  Normal heart sounds.  Pulmonary:     Effort: Pulmonary effort is normal. No respiratory distress.     Breath sounds: Normal breath sounds. No wheezing or rhonchi.  Musculoskeletal:        General: Normal range of motion.     Cervical back: Normal range of motion and neck supple.  Skin:    General: Skin is warm and dry.     Findings: No erythema or rash.  Neurological:     General: No focal deficit present.     Mental Status: Nathan [name] is alert and oriented to person, place, and time. Mental status is at baseline.  Psychiatric:        Mood and Affect: Mood normal.        Behavior: Behavior normal.        Thought Content: Thought content normal.        Judgment: Judgment normal.     Lab Results:  CBC    Component Value Date/Time   WBC 8.3 11/12/2023 1108   RBC 4.51 11/12/2023 1108   HGB 12.9 (L) 11/12/2023 1108   HCT 39.8 11/12/2023 1108   PLT 286 11/12/2023 1108   MCV 88.2 11/12/2023 1108   MCH 28.6 11/12/2023 1108   MCHC 32.4 11/12/2023 1108   RDW 13.6 11/12/2023 1108   LYMPHSABS 1.1 02/04/2023 0235   MONOABS 0.8 02/04/2023 0235   EOSABS 208 11/12/2023 1108   BASOSABS 33 11/12/2023 1108    BMET    Component Value Date/Time   NA 140 11/12/2023 1108   K 3.8 11/12/2023 1108   CL 100 11/12/2023 1108   CO2 32 11/12/2023 1108   GLUCOSE 106 (H) 11/12/2023 1108   BUN 12 11/12/2023 1108   CREATININE 0.61 (L) 11/12/2023 1108   CALCIUM  9.1 11/12/2023 1108   GFRNONAA >60 09/27/2023 1507   GFRAA >60 09/17/2019 1502    BNP    Component Value Date/Time   BNP 18.2 04/10/2014 0811    ProBNP No results found for: PROBNP  Imaging: No results found.   Assessment & Plan:   1. Asthma, unspecified asthma severity, unspecified whether complicated, unspecified whether persistent (Primary) - AMB REFERRAL FOR DME  2.  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 Symptoms include exertional dyspnea and variable breathing difficulties. Pulmonary function tests indicate severe obstruction, suggesting asthma-COPD overlap due to smoking history and obstruction severity. - Continue Breztri  as prescribed. - Prescribe albuterol  nebulizer for use every six hours as needed for acute exacerbations of dyspnea, chest tightness, or wheezing. - Instruct to use nebulizer if symptoms do not improve with rest or rescue inhaler.  Pulmonary sarcoidosis Pulmonary sarcoidosis managed with methotrexate  and prednisone . Methotrexate  resumed post-surgery. No current need for Bactrim  prophylaxis. - Continue methotrexate  20 mg weekly. - Continue prednisone  5 mg daily.  Cutaneous sarcoidosis (periocular and nasal rash) Cutaneous sarcoidosis with periocular and nasal rash. Rash improved with increased prednisone  dose. Current management includes prednisone  and topical hydrocortisone for nasal rash. Avoidance of hydrocortisone near eyes is advised. - Continue prednisone  5 mg daily. - Use hydrocortisone ointment on nasal rash, avoiding periocular area. - Monitor for rash flare-ups and adjust treatment as needed.   Almarie LELON Ferrari, NP 12/16/2023

## 2023-12-14 ENCOUNTER — Other Ambulatory Visit: Payer: Self-pay

## 2023-12-14 ENCOUNTER — Emergency Department (HOSPITAL_COMMUNITY)
Admission: EM | Admit: 2023-12-14 | Discharge: 2023-12-14 | Disposition: A | Discharge status: Home / Self Care | Attending: Emergency Medicine | Admitting: Emergency Medicine

## 2023-12-14 ENCOUNTER — Encounter (HOSPITAL_COMMUNITY): Payer: Self-pay | Admitting: Emergency Medicine

## 2023-12-14 DIAGNOSIS — Z7984 Long term (current) use of oral hypoglycemic drugs: Secondary | ICD-10-CM | POA: Diagnosis not present

## 2023-12-14 DIAGNOSIS — R059 Cough, unspecified: Secondary | ICD-10-CM | POA: Diagnosis present

## 2023-12-14 DIAGNOSIS — J029 Acute pharyngitis, unspecified: Secondary | ICD-10-CM | POA: Insufficient documentation

## 2023-12-14 DIAGNOSIS — E119 Type 2 diabetes mellitus without complications: Secondary | ICD-10-CM | POA: Diagnosis not present

## 2023-12-14 LAB — RESP PANEL BY RT-PCR (RSV, FLU A&B, COVID)  RVPGX2
Influenza A by PCR: NEGATIVE
Influenza B by PCR: NEGATIVE
Resp Syncytial Virus by PCR: NEGATIVE
SARS Coronavirus 2 by RT PCR: NEGATIVE

## 2023-12-14 LAB — GROUP A STREP BY PCR: Group A Strep by PCR: NOT DETECTED

## 2023-12-14 MED ORDER — ACETAMINOPHEN 500 MG PO TABS
1000.0000 mg | ORAL_TABLET | Freq: Once | ORAL | Status: AC
  Administered 2023-12-14: 1000 mg via ORAL
  Filled 2023-12-14: qty 2

## 2023-12-14 MED ORDER — LIDOCAINE VISCOUS HCL 2 % MT SOLN
15.0000 mL | Freq: Once | OROMUCOSAL | Status: AC
  Administered 2023-12-14: 15 mL via OROMUCOSAL
  Filled 2023-12-14: qty 15

## 2023-12-14 MED ORDER — DEXAMETHASONE 4 MG PO TABS
10.0000 mg | ORAL_TABLET | Freq: Once | ORAL | Status: AC
  Administered 2023-12-14: 10 mg via ORAL
  Filled 2023-12-14: qty 3

## 2023-12-14 MED ORDER — IBUPROFEN 400 MG PO TABS
600.0000 mg | ORAL_TABLET | Freq: Once | ORAL | Status: AC
  Administered 2023-12-14: 600 mg via ORAL
  Filled 2023-12-14: qty 1

## 2023-12-14 NOTE — Discharge Instructions (Signed)
 Nathan [name]  Thank you for allowing us  to take care of you today.  You came to the Emergency Department today because you have had a sore throat since this morning as well as some dry cough since yesterday.  Here in the emergency department your vitals and exam were overall looking good.  You do not have any signs of ear infection, or major abnormalities on your throat exam.  Your strep swab was negative for strep throat, and you are negative for COVID, flu, RSV.  Given you are talking normally, breathing normally, able to drink liquids, you are safe to continue to manage your symptoms outpatient.  You most likely have a virus causing your symptoms.  You can use Tylenol  and ibuprofen  every 4-6 hours as needed, as well as tea with honey, salt water gargles, throat lozenges, etc. to treat your symptoms.  To-Do: 1. Please follow-up with your primary doctor within 1 - 2 weeks / as soon as possible.   Please return to the Emergency Department or call 911 if you experience have worsening of your symptoms, or do not get better, difficulty breathing, drooling, inability to drink liquids, chest pain, shortness of breath, severe or significantly worsening pain, high fever, severe confusion, pass out or have any reason to think that you need emergency medical care.   We hope you feel better soon.   Mitzie Later, MD Department of Emergency Medicine [facility]

## 2023-12-14 NOTE — ED Notes (Signed)
 Writer spoke with lab; specimen needs to be obtained on another swab. Lab to tube correct swab to station 71.

## 2023-12-14 NOTE — ED Triage Notes (Signed)
 P[t. Stated, I woke up this morning with a sore throat, hard to swallow. Im also having neck pain. I had surgery in August on my neck , had a fusion and I had neck pain this morning.

## 2023-12-14 NOTE — ED Triage Notes (Signed)
 Pt states throat is burning and its hard to swallow since this morning. Pt able to talk without complications. Pt able to drink water. Denies fevers. Axox4.

## 2023-12-14 NOTE — ED Provider Notes (Signed)
 [place] [facility] Provider Note   CSN: [id number] Arrival date & time: 12/14/23  9080     History Chief Complaint  Patient presents with   Sore Throat   Neck Pain    HPI: Nathan [name] is a 53 y.o. male with history pertinent for psychosis, chronic cough, hyperlipidemia, T2DM, cervical DDD, GERD, sickle cell trait who presents complaining of sore throat. Patient arrived via POV.  History provided by patient.  No interpreter required during this encounter.  Patient reports that he was in his normal state of health until yesterday when he developed a nonproductive cough, reports that today he woke up at approximately 4 AM with a sore throat.  Denies any ear pain, fevers, chills.  Reports that he is tolerating p.o., and his voice sounds to be at baseline.  Denies any new or different chest pain, shortness of breath, congestion, rhinorrhea, nausea, vomiting, diarrhea.  Patient's recorded medical, surgical, social, medication list and allergies were reviewed in the Snapshot window as part of the initial history.   Prior to Admission medications   Medication Sig Start Date End Date Taking? Authorizing Provider  albuterol  (PROVENTIL ) (2.5 MG/3ML) 0.083% nebulizer solution Take 3 mLs (2.5 mg total) by nebulization every 6 (six) hours as needed for wheezing or shortness of breath. 12/13/23   Hope Almarie ORN, NP  albuterol  (VENTOLIN  HFA) 108 [phone] Base) MCG/ACT inhaler Inhale 2 puffs into the lungs every 6 (six) hours as needed for wheezing or shortness of breath. 03/08/22   Ruthell Lauraine FALCON, NP  amitriptyline (ELAVIL) 10 MG tablet Take 10 mg by mouth at bedtime. 02/22/23   [provider]  budeson-glycopyrrolate-formoterol  (BREZTRI  AEROSPHERE) 160-9-4.8 MCG/ACT AERO Inhale 2 puffs into the lungs 2 (two) times daily. 05/16/23   Shelah Lamar RAMAN, MD  celecoxib (CELEBREX) 100 MG capsule Take 100 mg by mouth 2 (two) times daily as needed (pain.).     [provider]  cetirizine  (ZYRTEC ) 10 MG tablet Take 10 mg by mouth in the morning. 04/05/23 04/04/24  [provider]  Cholecalciferol (VITAMIN D3) 10 MCG (400 UNIT) tablet Take 400 Units by mouth in the morning. 05/22/23   [provider]  cyclobenzaprine  (FLEXERIL ) 10 MG tablet Take 10 mg by mouth in the morning and at bedtime. 05/18/22   [provider]  Docusate Sodium (DSS) 100 MG CAPS Take 100 mg by mouth in the morning and at bedtime. 03/14/23   [provider]  Evolocumab  (REPATHA  SURECLICK) 140 MG/ML SOAJ Inject 140 mg into the skin every 14 (fourteen) days. 06/25/23   Thukkani, Arun K, MD  fluticasone  (FLONASE ) 50 MCG/ACT nasal spray Place 1 spray into both nostrils in the morning. 04/05/23   [provider]  folic acid  (FOLVITE ) 1 MG tablet Take 1 tablet (1 mg total) by mouth daily. 11/12/23   Rice, Lonni ORN, MD  furosemide  (LASIX ) 20 MG tablet Take 0.5 tablets (10 mg total) by mouth daily. 04/25/23   Thukkani, Arun K, MD  hydrocortisone 2.5 % ointment Apply 1 g topically 2 (two) times daily. 10/14/23   [provider]  JANUVIA 50 MG tablet Take 50 mg by mouth daily. 11/27/23   [provider]  metFORMIN (GLUCOPHAGE-XR) 500 MG 24 hr tablet Take 500 mg by mouth 2 (two) times daily. 08/24/21   [provider]  methotrexate  (RHEUMATREX) 2.5 MG tablet Take 8 tablets (20 mg total) by mouth once a week. Caution:Chemotherapy. Protect from light. 11/12/23  Jeannetta Lonni ORN, MD  morphine  (MSIR) 15 MG tablet Take 15 mg by mouth 4 (four) times daily as needed. 11/15/23   [provider]  omeprazole (PRILOSEC) 20 MG capsule Take 20 mg by mouth daily before breakfast. 05/29/22 09/25/26  [provider]  oxyCODONE -acetaminophen  (PERCOCET) 7.5-325 MG tablet Take 1 tablet by mouth 4 (four) times daily as needed. Patient not taking: Reported on 12/13/2023 10/18/23   [provider]  pravastatin (PRAVACHOL)  10 MG tablet Take 10 mg by mouth every evening. 09/14/22   [provider]  predniSONE  (DELTASONE ) 20 MG tablet Take 1 tablet (20 mg total) by mouth daily with breakfast. Patient not taking: Reported on 12/13/2023 11/01/23   Shelah Lamar RAMAN, MD  predniSONE  (DELTASONE ) 5 MG tablet Take 1 tablet (5 mg total) by mouth daily with breakfast. 11/12/23   Jeannetta Lonni ORN, MD  pregabalin  (LYRICA ) 200 MG capsule Take 200 mg by mouth in the morning and at bedtime. 07/17/22   [provider]  Semaglutide , 1 MG/DOSE, 4 MG/3ML SOPN Inject 1 mg into the skin once a week. 10/29/23   Thukkani, Arun K, MD  sulfamethoxazole -trimethoprim  (BACTRIM  DS) 800-160 MG tablet TAKE 1 TABLET BY MOUTH THREE TIMES WEEKLY (ON MONDAY, WEDNESDAY & FRIDAY) Patient taking differently: Take 1 tablet by mouth 3 (three) times a week. 06/02/23   Darlean Ozell NOVAK, MD  valsartan  (DIOVAN ) 80 MG tablet Take 1 tablet (80 mg total) by mouth daily. Patient taking differently: Take 80 mg by mouth every evening. 04/25/23   Thukkani, Arun K, MD     Allergies: Atorvastatin , Rosuvastatin , Tizanidine , Zetia [ezetimibe], Oxycodone -acetaminophen , and Percocet [oxycodone -acetaminophen ]   Review of Systems   ROS as per HPI  Physical Exam Updated Vital Signs BP 118/89 (BP Location: Right Arm)   Pulse 90   Temp 98.6 F (37 C) (Oral)   Resp 18   SpO2 100%  Physical Exam Vitals and nursing note reviewed.  Constitutional:      General: He is not in acute distress.    Appearance: He is well-developed.  HENT:     Head: Normocephalic and atraumatic.     Right Ear: Tympanic membrane normal.     Left Ear: Tympanic membrane normal.     Mouth/Throat:     Comments: 2+ tonsils bilaterally with mild erythema Eyes:     Conjunctiva/sclera: Conjunctivae normal.  Cardiovascular:     Rate and Rhythm: Normal rate and regular rhythm.     Heart sounds: No murmur heard. Pulmonary:     Effort: Pulmonary effort is normal. No respiratory  distress.     Breath sounds: Normal breath sounds.  Abdominal:     Palpations: Abdomen is soft.     Tenderness: There is no abdominal tenderness.  Musculoskeletal:        General: No swelling.     Cervical back: Neck supple.  Skin:    General: Skin is warm and dry.     Capillary Refill: Capillary refill takes less than 2 seconds.  Neurological:     Mental Status: He is alert.  Psychiatric:        Mood and Affect: Mood normal.     ED Course/ Medical Decision Making/ A&P    Procedures Procedures   Medications Ordered in ED Medications  dexamethasone  (DECADRON ) tablet 10 mg (10 mg Oral Given 12/14/23 1158)  acetaminophen  (TYLENOL ) tablet 1,000 mg (1,000 mg Oral Given 12/14/23 1157)  ibuprofen  (ADVIL ) tablet 600 mg (600 mg Oral Given 12/14/23 1158)  lidocaine  (XYLOCAINE ) 2 % viscous mouth solution 15 mL (15 mLs Mouth/Throat Given 12/14/23 1200)    Medical Decision Making:   Landry Kamath is a 53 y.o. male who presents for sore throat and cough as per above.  Physical exam is pertinent for 2+ tonsils with mild erythema without exudates.   The differential includes but is not limited to strep pharyngitis, viral pharyngitis, RPA, PTA, pneumonia.  Independent historian: None  External data reviewed: No pertinent external data  Labs: Ordered, Independent interpretation, and Details: Strep swab negative, COVID/flu/RSV negative  Radiology: Not indicated No results found.  EKG/Medicine tests: Not indicated EKG Interpretation:                  Interventions: Tylenol , ibuprofen , Decadron , lidocaine   See the EMR for full details regarding lab and imaging results.  Patient presents to the emergency department for dry cough and sore throat.  Overall well-appearing on exam, hemodynamically stable, patient does not report any neck pain on my exam, no midline tenderness, no range pain, no meningismus on exam, low index of suspicion for meningitis.  Patient without AOM or  mastoiditis on exam.  No pain with range of motion of neck to raise suspicion for RPA, no unilateral deviation to raise concern for PTA.  Do not feel the patient requires imaging at this time.  Patient has dry cough, however no focal lung abnormalities, no productive cough, do not feel that patient warrants chest x-ray at this time.  Patient underwent strep swab as well as COVID/flu/RSV test which was negative.  Patient was treated symptomatically with multimodal therapy with improvement.  Given patient's improvement, overall well-appearing, symptoms are most likely due to viral infection, discussed OTC pain management, return precautions, patient comfortable with this plan, discharged in stable condition with plan for follow-up with PCP.  Presentation is most consistent with acute uncomplicated illness and I did consider and rule out acute life/limb-threatening illness  Discussion of management or test interpretations with external provider(s): Not indicated  Risk Drugs:OTC drugs and Prescription drug management  Disposition: DISCHARGE: I believe that the patient is safe for discharge home with outpatient follow-up. Patient was informed of all pertinent physical exam, laboratory, and imaging findings.  Patient's suspected etiology of their symptom presentation was discussed with the patient and all questions were answered. We discussed following up with PCP. I provided thorough ED return precautions. The patient feels safe and comfortable with this plan.  MDM generated using voice dictation software and may contain dictation errors.  Please contact me for any clarification or with any questions.  Clinical Impression:  1. Pharyngitis, unspecified etiology      Discharge   Final Clinical Impression(s) / ED Diagnoses Final diagnoses:  Pharyngitis, unspecified etiology    Rx / DC Orders ED Discharge Orders     None        Rogelia Jerilynn RAMAN, MD 12/15/23 1635

## 2023-12-14 NOTE — ED Notes (Signed)
Written and verbal discharge instructions administered. Pt denies any questions or concerns at this time.

## 2023-12-28 ENCOUNTER — Telehealth: Payer: Self-pay | Admitting: Internal Medicine

## 2023-12-28 MED ORDER — OZEMPIC (2 MG/DOSE) 8 MG/3ML ~~LOC~~ SOPN: 2.0000 mg | PEN_INJECTOR | SUBCUTANEOUS | 3 refills | Status: AC

## 2023-12-28 NOTE — Telephone Encounter (Signed)
*  STAT* If patient is at the pharmacy, call can be transferred to refill team.   1. Which medications need to be refilled? (please list name of each medication and dose if known)   Semaglutide , 1 MG/DOSE, 4 MG/3ML SOPN     2. Would you like to learn more about the convenience, safety, & potential cost savings by using the [facility]? no  3. Are you open to using the [facility] (Type [facility].  ).no    4. Which pharmacy/location (including street and city if local pharmacy) is medication to be sent to? [facility] - [address], [place] - [address]     5. Do they need a 30 day or 90 day supply? 30 day   Pharmacy called to verify dose please advise

## 2023-12-28 NOTE — Telephone Encounter (Signed)
 Spoke with patient--currently tolerating Ozempic  1 mg well. Patient reported that when starting Ozempic , they were instructed to discontinue Jardiance  and continue Januvia. Clarified that Ozempic  and Januvia should not be used together, but Ozempic  can be combined with Jardiance . Advised patient to stop Januvia and continue Ozempic  as prescribed. Ozempic  dose increased to 2 mg once a week.

## 2024-01-08 ENCOUNTER — Encounter: Payer: Self-pay | Admitting: Internal Medicine

## 2024-01-09 ENCOUNTER — Other Ambulatory Visit: Payer: Self-pay | Admitting: Internal Medicine

## 2024-01-09 DIAGNOSIS — D869 Sarcoidosis, unspecified: Secondary | ICD-10-CM

## 2024-01-14 ENCOUNTER — Ambulatory Visit: Payer: Self-pay | Admitting: Internal Medicine

## 2024-01-14 NOTE — Telephone Encounter (Signed)
 Last Fill: 11/12/2023 both  Labs: 11/12/2023 Glucose  106 Creat 0.61 Hemoglobin 12.9 Absolute Mono 988  Next Visit: 02/20/2024  Last Visit: 11/12/2023  DX: Sarcoidosis of lung   Current Dose per office note 11/12/2023: methotrexate  20 mg PO weekly   Contacted patient to confirm prednisone  dose. Patient states he taking 5mg  daily.   Okay to refill Methotrexate  and prednisone ?

## 2024-01-16 ENCOUNTER — Ambulatory Visit: Attending: Orthopedic Surgery

## 2024-01-16 ENCOUNTER — Other Ambulatory Visit: Payer: Self-pay

## 2024-01-16 DIAGNOSIS — M6281 Muscle weakness (generalized): Secondary | ICD-10-CM | POA: Insufficient documentation

## 2024-01-16 DIAGNOSIS — R262 Difficulty in walking, not elsewhere classified: Secondary | ICD-10-CM | POA: Diagnosis present

## 2024-01-16 DIAGNOSIS — M542 Cervicalgia: Secondary | ICD-10-CM | POA: Diagnosis present

## 2024-01-16 NOTE — Therapy (Signed)
 OUTPATIENT PHYSICAL THERAPY CERVICAL EVALUATION   Patient Name: Nathan [name] MRN: [id number] DOB:[birth year], 53 y.o., male Today's Date: 01/16/2024  END OF SESSION:  PT End of Session - 01/16/24 1259     Visit Number 1    Date for Recertification  04/09/24    PT Start Time 1300    PT Stop Time 1345    PT Time Calculation (min) 45 min    Behavior During Therapy [facility] for tasks assessed/performed          Past Medical History:  Diagnosis Date   Acid reflux disease    Arthritis    Asthma    'I think I might have asthma   Chronic back pain    Diabetes mellitus without complication (HCC)    Dyspnea    chronic   Hyperlipidemia    Hypertension    Sarcoidosis    Past Surgical History:  Procedure Laterality Date   ANTERIOR CERVICAL DECOMP/DISCECTOMY FUSION N/A 10/03/2023   Procedure: ANTERIOR CERVICAL DECOMPRESSION/DISCECTOMY FUSION 2 LEVELS;  Surgeon: Beuford Anes, MD;  Location: [facility] OR;  Service: Orthopedics;  Laterality: N/A;  ANTERIOR CERVICAL DECOMPRESSION FUSION CERVICAL 4- CERVICAL 5, CERVICAL 5- CERVICAL 6 WITH INSTRUMENTATION AND ALLOGRAFT   BACK SURGERY     COLONOSCOPY     DENTAL SURGERY     NO PAST SURGERIES     Patient Active Problem List   Diagnosis Date Noted   Chronic dyspnea 11/01/2023   Sickle cell trait 11/01/2023   Vitamin D  deficiency 11/01/2023   Sarcoidosis of skin (HCC) 11/01/2023   Hypotestosteronemia 08/27/2023   Type 2 diabetes mellitus without complication, without long-term current use of insulin  (HCC) 06/22/2023   Erectile dysfunction 06/19/2023   Chronic pain of multiple joints 01/10/2023   Myalgia 08/29/2022   LPRD (laryngopharyngeal reflux disease) 05/29/2022   Pharyngoesophageal dysphagia 05/29/2022   Spinal stenosis, cervical region 11/25/2021   High risk medication use 11/15/2021   HLD (hyperlipidemia) 09/13/2021   Asthma 11/10/2020   Neuropathy 08/10/2020   MDD (major depressive disorder) 08/05/2020   GAD (generalized  anxiety disorder) 08/05/2020   Panic disorder 08/05/2020   GERD (gastroesophageal reflux disease) 06/23/2020   Seasonal allergies 06/23/2020   Shoulder pain, left 10/23/2016   Cervical disc disease 06/14/2016   Upper airway cough syndrome 07/23/2014   Sarcoidosis of lung 03/06/2011    PCP: [facility]  REFERRING PROVIDER: Beuford Anes  REFERRING DIAG: cervical fusion  THERAPY DIAG:  Muscle weakness (generalized)  Difficulty in walking, not elsewhere classified  Cervicalgia  Rationale for Evaluation and Treatment: Rehabilitation  ONSET DATE: August 2025  SUBJECTIVE:  SUBJECTIVE STATEMENT: Had the fusion August ,  it really helped the L upper traps pain I was experiencing.  Now pain more in R upper traps .Noted decreased endurance for holding items in B hands , I just end up having to drop things   Hand dominance: Right  PERTINENT HISTORY:  Referred by orthopedist following cervical fusion  PAIN:  Are you having pain? Yes: NPRS scale: 9-10 Pain location: lower cervical spine  Pain description: more on R side, deep ache  Aggravating factors: prolonged standing any prolonged activity Relieving factors: takes some pain meds which don't help   PRECAUTIONS: None  RED FLAGS: None     WEIGHT BEARING RESTRICTIONS: No  FALLS:  Has patient fallen in last 6 months? No  LIVING ENVIRONMENT: Lives with: lives with their family Lives in: House/apartment Stairs: has stairs doesn't use  Has following equipment at home: None  OCCUPATION: disabled   PLOF: Independent and Independent with gait  PATIENT GOALS: reduce lower cervical spine pain and improve activity tolerance  NEXT MD VISIT: unknown  OBJECTIVE:  Note: Objective measures were completed at  Evaluation unless otherwise noted.  DIAGNOSTIC FINDINGS:  na  PATIENT SURVEYS:  Neck Disability Index score: 38 / 50 = 76.0 %  COGNITION: Overall cognitive status: Within functional limits for tasks assessed  SENSATION: Wnl B UE's  POSTURE: mild forward head, sloped, depressed B shoulders  PALPATION: Thickened tissue throughout R upper traps and middle traps, non tender specifically with palpation, pt reports more deep pain   CERVICAL ROM:   Active ROM A/PROM (deg) eval  Flexion 80  Extension 50  Right lateral flexion   Left lateral flexion   Right rotation 65  Left rotation 75   (Blank rows = not tested)  UPPER EXTREMITY ROM:  Active ROM Right eval Left eval  Shoulder flexion 125 140  Shoulder extension    Shoulder abduction 125 140  Shoulder adduction    Shoulder extension    Shoulder internal rotation : hand behind back  PSIS  PSIS  Shoulder external rotation hand behind head  Tight end range  Tight end range   Elbow flexion    Elbow extension    Wrist flexion    Wrist extension    Wrist ulnar deviation    Wrist radial deviation    Wrist pronation    Wrist supination     (Blank rows = not tested)  UPPER EXTREMITY MMT:  MMT Right eval Left eval  Shoulder flexion wfl wfl  Shoulder extension wfl wfl  Shoulder abduction wfl wfl  Shoulder adduction    Shoulder extension    Shoulder internal rotation 3+ 3+  Shoulder external rotation 3+ 3+  Middle trapezius    Lower trapezius    Elbow flexion wfl wfl  Elbow extension wfl wfl  Wrist flexion wfl wfl  Wrist extension wfl wfl  Wrist ulnar deviation    Wrist radial deviation    Wrist pronation    Wrist supination    Grip strength     (Blank rows = not tested)  CERVICAL SPECIAL TESTS:  na  FUNCTIONAL TESTS:  Tandem standing 30 sec , sway with R foot post Single leg stance 30 sec each, marked sway on R Heel/toe walking, unable to heel walk and maintain balance  TREATMENT DATE:  01/16/24 Evaluation,  Instructed in theraband B shoulder ER, yellow  and IR red, and rationale for these exercises  PATIENT EDUCATION:  Education details: POC, goals Person educated: Patient Education method: Explanation, Demonstration, Tactile cues, and Verbal cues Education comprehension: verbalized understanding, returned demonstration, verbal cues required, and tactile cues required  HOME EXERCISE PROGRAM: Access Code: [email or web address] URL: [email or web address] Date: 01/16/2024 Prepared by: Greig Credit  Exercises - Seated Shoulder External Rotation AAROM with Dowel  - 1 x daily - 7 x weekly - 3 sets - 10 reps - Seated Shoulder Full Range Internal Rotation with Anchored Resistance  - 1 x daily - 7 x weekly - 3 sets - 10 reps  ASSESSMENT:  CLINICAL IMPRESSION: Patient is a 53 y.o. male who was evaluated today by physical therapy for assistance with rehabilitation following cervical fusion in August, which makes him 5 months post op.  His chief complaint is of lingering pain , deep at base of cervical spine posteriorly and radiating outward.  Also reports balance deficits, and difficulty with sustaining strength UE's for functional tasks, his arms and hands fatigue quickly . Evaluation reveals strength deficits B rotator cuff musculature, as well as some loss of ability with righting reactions. Would recommend an FGA assessment for more comprehensive dynamic balance assessment in future sessions. He should benefit from physical therapy to address his strength deficits and assist him with pain management, balance  OBJECTIVE IMPAIRMENTS: decreased activity tolerance, decreased balance, decreased endurance, decreased knowledge of condition, difficulty walking, decreased ROM, decreased strength, hypomobility, increased fascial restrictions, impaired perceived  functional ability, increased muscle spasms, impaired flexibility, impaired UE functional use, postural dysfunction, and pain.   ACTIVITY LIMITATIONS: carrying, lifting, standing, sleeping, dressing, and reach over head  PARTICIPATION LIMITATIONS: cleaning, laundry, driving, and community activity  PERSONAL FACTORS: 1-2 comorbidities: sarcodosis and methotrexate  are also affecting patient's functional outcome.   REHAB POTENTIAL: Good  CLINICAL DECISION MAKING: Evolving/moderate complexity  EVALUATION COMPLEXITY: Moderate   GOALS: Goals reviewed with patient? Yes  SHORT TERM GOALS: Target date: 2 weeks, 01/30/24  I HEP Baseline:initiated today  Goal status: INITIAL   LONG TERM GOALS: Target date: 2 /25/26 12 weeks  Improve strength for B shoulder IR and ER to 4+/5 , from 3+/5 to improve endurance for UE activities Baseline:  Goal status: INITIAL  2.  NDI improve from 76% disability to 84% or less Baseline:  Goal status: INITIAL  3.  Improve dynamic balance: FGA 27/30 or greater Baseline: TBD Goal status: INITIAL    PLAN:  PT FREQUENCY: 1x/week  PT DURATION: 12 weeks  PLANNED INTERVENTIONS: 97110-Therapeutic exercises, 97530- Therapeutic activity, 97112- Neuromuscular re-education, 97535- Self Care, [id number]- Manual therapy, and Patient/Family education  PLAN FOR NEXT SESSION: reassess B rotator cuff strength, manual techniques to reduce pain upper back/ periscapular, progress with periscapular and rotator cuff strengthening    Kaiven Vester L Ad Guttman, PT, DPT, OCS 01/16/2024, 4:04 PM

## 2024-01-24 NOTE — Addendum Note (Signed)
 Addended byBETHA CREDIT, Cicely Ortner L on: 01/24/2024 10:30 AM   Modules accepted: Orders

## 2024-02-05 ENCOUNTER — Ambulatory Visit: Admitting: Physical Therapy

## 2024-02-11 NOTE — Progress Notes (Deleted)
 "  Office Visit Note  Patient: Nathan [name]             Date of Birth: November 22, 1970           MRN: [id number]             PCP: [facility] Referring: [facility] Visit Date: 02/20/2024   Subjective:  No chief complaint on file.   History of Present Illness: Nathan [name] is a 53 y.o. male here for follow up with sarcoidosis on methotrexate  20 mg PO weekly and prednisone  5 mg daily.    Previous HPI 11/12/2023 Nathan [name] is a 53 year old male with sarcoidosis on methotrexate  20 mg PO weekly and prednisone  5 mg daily. 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   He experienced a flare-up of his skin rash after being off methotrexate  for two weeks due to surgery. He was prescribed prednisone , starting at 20 mg for two weeks which is almost completed. The rash is improved again.   He is also seeing a urologist for low testosterone levels, which were identified by his primary doctor. The urologist plans to conduct further blood tests.   He experiences stiffness when sitting for long periods and has a history of smoking, which he discussed in relation to potential environmental triggers for sarcoidosis. He has no known family history of sarcoidosis.   He has a history of nail biting and was advised to take a daily vitamin to help with nail growth but has not started yet.           Previous HPI 06/18/23 Nathan [name] is a 53 y.o. male here for follow up  for sarcoidosis with pulmonary, articular, and cutaneous disease involvement currently on methotrexate  20 mg p.o. weekly folic acid  1 mg daily and prednisone  5 mg daily.     His last pulmonary function test showed significant obstruction, and he is scheduled for another test on August 4th. He describes difficulty with air movement due to narrowed or reactive airways, likening it to a 'big bagpipe' with  narrowed pipes. He uses inhaler treatments, which are more effective for his obstructive problem. He experiences increased shortness of breath with exertion, such as climbing stairs or walking uphill, and becomes winded more easily than before. His wife noticed a mention of COPD in his medical chart, and he inquires about this diagnosis. He has a history of sarcoidosis, which typically causes restrictive lung problems, but his current issue is more obstructive in nature. He is not currently requiring oxygen therapy or CPAP at night.   He reports ongoing neck pain and is scheduled for another injection on the 12th, though he is unsure of the type of injection. He previously had an x-ray in March due to a pinched nerve, which was persistent and led him to the emergency room.   He is taking Bactrim  on Monday, Wednesday, and Friday due to his medication regimen. He recently had teeth extracted and was on antibiotics for that procedure. He is awaiting fitting for dentures and a partial.   He has been experiencing balance issues and is scheduled to see an ENT specialist for evaluation of possible vertigo. He was prescribed medication to chew as needed for this issue, but he does not find it effective.   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 His ankles feel tight and sometimes painful, especially in the morning.         Previous HPI 03/21/2023 Nathan [name] is a 53 y.o. male here for follow up for sarcoidosis with pulmonary, articular, and cutaneous disease involvement currently on methotrexate  20 mg p.o. weekly folic acid  1 mg daily and prednisone  5 mg daily.     He experiences ongoing neck and back pain, which he attributes to a pinched nerve. The pain radiates down his arm, affecting his ability to sit or stand for prolonged periods. He has undergone multiple procedures, most  recently had spinal epidural injection about a month ago, which provided temporary relief for two days. This was after recent updated MRI showing what sounds like multilevel degenerative disease. He continues to take methotrexate  every Wednesday night and prednisone  daily. No leg swelling or glandular swelling. Pain is present in the neck and shoulders, with radiation down the arm.   He has a history of sarcoidosis, currently managed with methotrexate  and prednisone . The treatment has been ongoing, and he has experienced some improvement in joint symptoms with low-dose prednisone . He recalls a previous treatment with high-dose prednisone , which led to weight gain.   He reports a skin rash that has been intermittently dry, for which he has been using a prescribed cream. He has not noticed any other rashes besides the one mentioned.   He is concerned about his ability to work due to his symptoms and has filed for disability. He mentions having undergone a functional capacity exam to assess his physical limitations. He recalls an episode from December where he visited the emergency room due to severe pain from a pinched nerve, during which he was prescribed a medrol  dose pack and muscle relaer. He has not been sick recently.     12/06/2022 Nathan [name] is a 53 y.o. male here for follow up for sarcoidosis with pulmonary, articular, and cutaneous disease involvement currently on methotrexate  20 mg p.o. weekly folic acid  1 mg daily and prednisone  5 mg daily.  He has had persistent pain in his neck and some shooting radiating type pain more down the right arm than down the left arm.  He is experiencing stiffness and numbness in the hands more after prolonged use especially holding a tight grip.  A little bit of pain around the MCP joints of the hand otherwise more just trouble gripping than actual pain.  He had an MRI of the neck yesterday that was not yet read.  Has a follow-up planned with pain  management for this.  He had recent labs checked that showed normal renal function.  Saw dermatology and was started with topical medication appears to be triamcinolone 0.1% for use on the ongoing facial rashes.  So far these have faded in color a little bit but not gone away.  He had follow-up with Dr. Shelah no concern of worsening pulmonary status.  He did have to take antibiotics for dental abscess suspected at the right upper molars.   08/29/2022 Nathan [name] is a 53 y.o. male here for follow up for sarcoidosis with pulmonary, articular, and cutaneous disease involvement currently on methotrexate  20 mg p.o. weekly folic acid  1 mg daily and prednisone  5 mg daily.  Skin rashes are doing well since her last visit.  [facility] for ongoing neck and upper back pain with trial of steroid injection to the  neck with partial benefit.  He is also been recommended if effectiveness is low for possible trigger point injection at multiple sites in the neck and upper back.  He had increase in cholesterol medication with his PCP onto a atorvastatin  and ezetimibe noticing some increased muscle pain and cramping so discontinued the ezetimibe for about 1 week.  At that visit on July 8 had an elevated CK level of 662.  Outside of these areas joint pain is doing well and not noticed any significant swelling in his arms or legs.  Still has some persistent cough swallowing difficulty and shortness of breath with exertion plans for upcoming repeat CT scan and pulmonology follow-up.  He is also been referred for seeing gastroenterology based on previous findings of laryngeal pharyngeal reflux disease.   05/30/22 Nathan [name] is a 54 y.o. male here for follow up for sarcoidosis with pulmonary articular and cutaneous disease involvement currently on methotrexate  20 mg p.o. weekly folic acid  1 mg daily and prednisone  5 mg daily.  Rash is somewhat improved more since her last visit.   Follow-up with pulmonology clinic not clear whether there is active disease plan for follow-up this summer with repeat lung study to assess this.  He saw the ENT doctor noticed excessive mucus on exam suspicious for reflux for his upper airway cough syndrome issue.  He saw spine specialist for his neck and upper back pain initially started low-dose Lyrica  and tramadol  while getting outside records and workup and has follow-up scheduled later this week.   02/28/22 Nathan [name] is a 52 y.o. male here for follow up for sarcoidosis with pulmonary and cutaneous involvement on methotrexate  15 mg p.o. weekly and folic acid  1 mg daily.  Since her last visit he had PET scan that looked good no evidence of cardiac sarcoidosis activity.  CT scan in October with stable lymphadenopathy and bandlike and nodular opacities in the lungs, although the associated nondiagnostic CT in December was consistent with hypermetabolic mediastinal and hilar lymphadenopathy and bilateral pulmonary parenchymal opacities consistent with inflammatory process and active granulomatous disease.  He is tolerating the methotrexate  without any difficulty.  Still been noticing some ongoing skin rash with minimally raised hyperpigmented spots along the medial border of the eyes.  He has not felt a significant improvement with his neck pain on the medication.  Still feels restriction in his range of motion especially looking towards the right and gets pain painful experiences towards the left side.   12/19/21 Nathan [name] is a 53 y.o. male here for follow up for joint pains with history of pulmonary and cutaneous sacoidosis now on methotrexate  15 mg PO weekly and folic acid  1 mg daily. He has not noticed any problems with the medication but also no improvement in symptoms so far. Still pain worst in the neck and upper back with radiation into back and with headaches. Asthma symptoms doing somewhat worse this year. He had cardiac MRI but is  now recommended for cardiac PET scan to evaluate but not scheduled yet.   Previous HPI 11/15/2021 Nathan [name] is a 53 y.o. male here for joint pains with history of pulmonary and cutaneous sacoidosis.  His worst affected area is pretty constant pain in the base of the neck area frequently with radiation up the neck and posterior headaches associated with this.  He has had problems going on for years but seems to be progressively getting somewhat worse.  He does get some pain in extremities elsewhere  but by far the most limiting for him is in the base of the neck.  He has tried multiple NSAIDs including meloxicam  and Celebrex is also tried muscle relaxant medications none of which were very significantly beneficial.  He is tried physical therapy completing courses and had local injections that did not greatly relieve symptoms.  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  He had most recent neck MRI in 2022 based on findings had discussed surgery options but is looking to avoid this due to concern about complications or long-term needing additional revision. Original diagnosis of sarcoidosis was based on pulmonary imaging finding and treated with very prolonged prednisone  taper.  Initially only had respiratory symptoms with some mild dyspnea and coughing that improved with the steroid treatments.  More recently this year he had an episode of similar symptoms coming back and was treated with a round of steroids from pulmonology clinic in noticed a increase in blood glucose up to 400s.  He is also developed some new skin rashes on the face previously had involvement on extremities but no facial rashes with original symptoms.  He is prescribed topical medication by Dr. Livingston but has not seen any difference so far.   No Rheumatology ROS completed.   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/2022   Pharyngoesophageal dysphagia 05/29/2022   Spinal stenosis, cervical region 11/25/2021   High risk medication use 11/15/2021   HLD (hyperlipidemia) 09/13/2021   Asthma 11/10/2020   Neuropathy 08/10/2020   MDD (major depressive disorder) 08/05/2020   GAD (generalized anxiety disorder) 08/05/2020   Panic disorder 08/05/2020   GERD (gastroesophageal reflux disease) 06/23/2020   Seasonal allergies 06/23/2020   Shoulder pain, left 10/23/2016   Cervical disc disease 06/14/2016   Upper airway cough syndrome 07/23/2014   Sarcoidosis of lung 03/06/2011    Past Medical History:  Diagnosis Date   Acid reflux disease    Arthritis    Asthma    'I think I might have asthma   Chronic back pain    Diabetes mellitus without complication (HCC)    Dyspnea    chronic   Hyperlipidemia    Hypertension    Sarcoidosis     Family History  Problem Relation Age of Onset   Hypertension Mother    Diabetes Mother    Heart attack Father    Diabetes Father    Hypertension Father    Gout Father    Colon polyps Sister    Heart disease Paternal Uncle    Prostate cancer Paternal Uncle    Heart disease Maternal Grandfather    Anemia Daughter    Anemia Daughter    Colon cancer Neg Hx    Esophageal cancer Neg Hx    Rectal cancer Neg Hx    Stomach cancer Neg Hx    Past Surgical History:  Procedure Laterality Date   ANTERIOR CERVICAL DECOMP/DISCECTOMY FUSION N/A 10/03/2023   Procedure: ANTERIOR CERVICAL DECOMPRESSION/DISCECTOMY FUSION 2 LEVELS;  Surgeon: Beuford Anes, MD;  Location: [facility] OR;  Service: Orthopedics;  Laterality: N/A;  ANTERIOR  CERVICAL DECOMPRESSION FUSION CERVICAL 4- CERVICAL 5, CERVICAL 5- CERVICAL 6 WITH INSTRUMENTATION AND ALLOGRAFT   BACK SURGERY     COLONOSCOPY     DENTAL SURGERY     NO PAST SURGERIES     Social History   Social History Narrative   Not on file   Immunization History  Administered Date(s) Administered   Dtap, Unspecified 11/09/1972, 01/07/1973, 03/01/1973, 03/24/1974, 12/06/1977   Hepatitis B, ADULT 08/27/2023   Influenza, Seasonal, Injecte, Preservative Fre 11/06/2022, 11/12/2023   Influenza,inj,Quad PF,6+ Mos 11/10/2020, 02/16/2022   Measles 09/23/1973   Moderna Covid-19 Fall Seasonal Vaccine 18yrs & older 11/06/2022   Mumps 03/24/1974   PFIZER Comirnaty(Gray Top)Covid-19 Tri-Sucrose Vaccine 10/28/2020   PFIZER(Purple Top)SARS-COV-2 Vaccination 10/27/2020   PNEUMOCOCCAL CONJUGATE-20 02/16/2022   Polio, Unspecified 11/09/1972, 01/07/1973, 03/11/1973, 03/24/1974, 12/06/1977   Rubella 09/23/1973   Tdap 02/16/2022     Objective: Vital Signs: There were no vitals taken for this visit.   Physical Exam   Musculoskeletal Exam: ***  CDAI Exam: CDAI Score: -- Patient Global: --; Provider Global: -- Swollen: --; Tender: -- Joint Exam 02/20/2024   No joint exam has been documented for this visit   There is currently no information documented on the homunculus. Go to the Rheumatology activity and complete the homunculus joint exam.  Investigation: No additional findings.  Imaging: No results found.  Recent Labs: Lab Results  Component Value Date   WBC 8.3 11/12/2023   HGB 12.9 (L) 11/12/2023   PLT 286 11/12/2023   NA 140 11/12/2023   K 3.8 11/12/2023   CL 100 11/12/2023   CO2 32 11/12/2023   GLUCOSE 106 (H) 11/12/2023   BUN 12 11/12/2023   CREATININE 0.61 (L) 11/12/2023   BILITOT 0.6 11/12/2023   ALKPHOS 62 04/28/2023   AST 17 11/12/2023   ALT 22 11/12/2023   PROT 7.0 11/12/2023   ALBUMIN 4.5 04/28/2023   CALCIUM  9.1 11/12/2023   GFRAA >60 09/17/2019     Speciality Comments: No specialty comments available.  Procedures:  No procedures performed Allergies: Atorvastatin , Rosuvastatin , Tizanidine , Zetia [ezetimibe], Oxycodone -acetaminophen , and Percocet [oxycodone -acetaminophen ]   Assessment / Plan:     Visit Diagnoses: No diagnosis found.  ***  Orders: No orders of the defined types were placed in this encounter.  No orders of the defined types were placed in this encounter.    Follow-Up Instructions: No follow-ups on file.   Viren Lebeau M Lashawn Bromwell, CMA  Note - This record has been created using Animal nutritionist.  Chart creation errors have been sought, but may not always  have been located. Such creation errors do not reflect on  the standard of medical care. "

## 2024-02-12 ENCOUNTER — Encounter: Payer: Self-pay | Admitting: Physical Therapy

## 2024-02-12 ENCOUNTER — Ambulatory Visit: Admitting: Physical Therapy

## 2024-02-12 DIAGNOSIS — R262 Difficulty in walking, not elsewhere classified: Secondary | ICD-10-CM

## 2024-02-12 DIAGNOSIS — M542 Cervicalgia: Secondary | ICD-10-CM

## 2024-02-12 DIAGNOSIS — M6281 Muscle weakness (generalized): Secondary | ICD-10-CM | POA: Diagnosis not present

## 2024-02-12 NOTE — Therapy (Signed)
 " OUTPATIENT PHYSICAL THERAPY CERVICAL TREATMENT   Patient Name: Nathan [name] MRN: [id number] DOB:Mar 20, 1970, 53 y.o., male Today's Date: 02/12/2024  END OF SESSION:  PT End of Session - 02/12/24 1223     Visit Number 2    Date for Recertification  04/09/24    PT Start Time 1230    PT Stop Time 1316    PT Time Calculation (min) 46 min    Activity Tolerance Patient tolerated treatment well    Behavior During Therapy [facility] for tasks assessed/performed           Past Medical History:  Diagnosis Date   Acid reflux disease    Arthritis    Asthma    'I think I might have asthma   Chronic back pain    Diabetes mellitus without complication (HCC)    Dyspnea    chronic   Hyperlipidemia    Hypertension    Sarcoidosis    Past Surgical History:  Procedure Laterality Date   ANTERIOR CERVICAL DECOMP/DISCECTOMY FUSION N/A 10/03/2023   Procedure: ANTERIOR CERVICAL DECOMPRESSION/DISCECTOMY FUSION 2 LEVELS;  Surgeon: Beuford Anes, MD;  Location: [facility] OR;  Service: Orthopedics;  Laterality: N/A;  ANTERIOR CERVICAL DECOMPRESSION FUSION CERVICAL 4- CERVICAL 5, CERVICAL 5- CERVICAL 6 WITH INSTRUMENTATION AND ALLOGRAFT   BACK SURGERY     COLONOSCOPY     DENTAL SURGERY     NO PAST SURGERIES     Patient Active Problem List   Diagnosis Date Noted   Chronic dyspnea 11/01/2023   Sickle cell trait 11/01/2023   Vitamin D  deficiency 11/01/2023   Sarcoidosis of skin (HCC) 11/01/2023   Hypotestosteronemia 08/27/2023   Type 2 diabetes mellitus without complication, without long-term current use of insulin  (HCC) 06/22/2023   Erectile dysfunction 06/19/2023   Chronic pain of multiple joints 01/10/2023   Myalgia 08/29/2022   LPRD (laryngopharyngeal reflux disease) 05/29/2022   Pharyngoesophageal dysphagia 05/29/2022   Spinal stenosis, cervical region 11/25/2021   High risk medication use 11/15/2021   HLD (hyperlipidemia) 09/13/2021   Asthma 11/10/2020   Neuropathy 08/10/2020   MDD  (major depressive disorder) 08/05/2020   GAD (generalized anxiety disorder) 08/05/2020   Panic disorder 08/05/2020   GERD (gastroesophageal reflux disease) 06/23/2020   Seasonal allergies 06/23/2020   Shoulder pain, left 10/23/2016   Cervical disc disease 06/14/2016   Upper airway cough syndrome 07/23/2014   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 Reports compliance with HEP.   Had the fusion August ,  it really helped the L upper traps pain I was experiencing.  Now pain more in R upper traps .Noted decreased endurance for holding items in B hands , I just end up having to drop things   Hand dominance: Right  PERTINENT HISTORY:  Referred by orthopedist following cervical fusion  PAIN:  Are you having pain? Yes: NPRS scale: 9-10 Pain location: lower cervical spine  Pain description: more on R side, deep ache  Aggravating factors: prolonged standing any prolonged activity Relieving factors: takes some pain meds which don't help   PRECAUTIONS: None  RED FLAGS: None     WEIGHT BEARING RESTRICTIONS: No  FALLS:  Has patient fallen in last 6 months? No  LIVING ENVIRONMENT: Lives with: lives with their family Lives in: House/apartment Stairs: has stairs doesn't use  Has following equipment at home: None  OCCUPATION: disabled   PLOF: Independent and Independent with  gait  PATIENT GOALS: reduce lower cervical spine pain and improve activity tolerance  NEXT MD VISIT: unknown  OBJECTIVE:  Note: Objective measures were completed at Evaluation unless otherwise noted.  DIAGNOSTIC FINDINGS:  na  PATIENT SURVEYS:  Neck Disability Index score: 38 / 50 = 76.0 %  COGNITION: Overall cognitive status: Within functional limits for tasks assessed  SENSATION: Wnl B UE's  POSTURE: mild forward head, sloped, depressed B shoulders  PALPATION: Thickened tissue throughout R upper traps and middle traps, non tender specifically with palpation, pt reports more deep pain   CERVICAL ROM:   Active ROM A/PROM (deg) eval  Flexion 80  Extension 50  Right lateral flexion   Left lateral flexion   Right rotation 65  Left rotation 75   (Blank rows = not tested)  UPPER EXTREMITY ROM:  Active ROM Right eval Left eval  Shoulder flexion 125 140  Shoulder extension    Shoulder abduction 125 140  Shoulder adduction    Shoulder extension    Shoulder internal rotation : hand behind back  PSIS  PSIS  Shoulder external rotation hand behind head  Tight end range  Tight end range   Elbow flexion    Elbow extension    Wrist flexion    Wrist extension    Wrist ulnar deviation    Wrist radial deviation    Wrist pronation    Wrist supination     (Blank rows = not tested)  UPPER EXTREMITY MMT:  MMT Right eval Left eval  Shoulder flexion wfl wfl  Shoulder extension wfl wfl  Shoulder abduction wfl wfl  Shoulder adduction    Shoulder extension    Shoulder internal rotation 3+ 3+  Shoulder external rotation 3+ 3+  Middle trapezius    Lower trapezius    Elbow flexion wfl wfl  Elbow extension wfl wfl  Wrist flexion wfl wfl  Wrist extension wfl wfl  Wrist ulnar deviation    Wrist radial deviation    Wrist pronation    Wrist supination    Grip strength     (Blank rows = not tested)  CERVICAL SPECIAL TESTS:  na  FUNCTIONAL TESTS:  Tandem standing  30 sec , sway with R foot post Single leg stance 30 sec each, marked sway on R Heel/toe walking, unable to heel walk and maintain balance  FGA (02/12/24): 23/30  2 2. 3 3. 3 4. 3  5. 3 6. 2 7. 0 8. 1 9. 3 10. 3   TREATMENT DATE:  02/12/24 -UBE x5 mins FW -upper trap stretch in sitting 10x10s Bil -median nn glide 2x15 Bil  -Slouch overcorrect x20 in sitting -physioball wall walks with OH press into ball 3x8, 2 sec hold -Functional Gait assessment administered -Manual soft tissue mobilization to the right upper trap, inc resting tension at base of c/s, improved tissue mobility and subjective improvement with tx.    01/16/24 Evaluation,  Instructed in theraband B shoulder ER, yellow  and IR red, and rationale for these exercises                                                                                                                              PATIENT EDUCATION:  Education details: POC, goals Person educated: Patient Education method: Explanation, Demonstration, Tactile cues, and Verbal cues Education comprehension: verbalized understanding, returned demonstration, verbal cues required, and tactile cues required  HOME EXERCISE PROGRAM: Access Code: [email or web address] URL: [email or web address] Date: 01/16/2024 Prepared by: Greig Credit  Exercises - Seated Shoulder External Rotation AAROM with Dowel  - 1 x daily - 7 x weekly - 3 sets - 10 reps - Seated Shoulder Full Range Internal Rotation with Anchored Resistance  - 1 x daily - 7 x weekly - 3 sets - 10 reps  ASSESSMENT:  CLINICAL IMPRESSION: Pt tolerated session well, was able to progress UE function with overhead movements and address neural tension bilaterally. Pt completes FGA scoring 23/30. Manual soft tissue mobilization integrated to R upper trap which improves symptoms at rest. Pt stands to benefit from continued skilled physical therapy to address deficit areas and restore safety with activities and  participations at home and in the community.    EVAL: Patient is a 53 y.o. male who was evaluated today by physical therapy for assistance with rehabilitation following cervical fusion in August, which makes him 5 months post op.  His chief complaint is of lingering pain , deep at base of cervical spine posteriorly and radiating outward.  Also reports balance deficits, and difficulty with sustaining strength UE's for functional tasks, his arms and hands fatigue quickly . Evaluation reveals strength deficits B rotator cuff musculature, as well as some loss of ability with righting reactions. Would recommend an FGA assessment for more comprehensive dynamic balance assessment in future sessions. He should benefit from physical therapy to address his strength deficits and assist him with pain management, balance  OBJECTIVE IMPAIRMENTS: decreased activity tolerance, decreased balance, decreased endurance, decreased knowledge of condition, difficulty walking, decreased ROM, decreased strength, hypomobility, increased fascial restrictions, impaired perceived functional ability, increased muscle spasms, impaired flexibility, impaired UE functional use, postural dysfunction, and pain.   ACTIVITY LIMITATIONS: carrying, lifting, standing, sleeping, dressing, and reach over head  PARTICIPATION LIMITATIONS: cleaning, laundry, driving, and community activity  PERSONAL FACTORS: 1-2 comorbidities: sarcodosis and methotrexate  are also affecting patient's functional outcome.   REHAB POTENTIAL: Good  CLINICAL  DECISION MAKING: Evolving/moderate complexity  EVALUATION COMPLEXITY: Moderate   GOALS: Goals reviewed with patient? Yes  SHORT TERM GOALS: Target date: 2 weeks, 01/30/24  I HEP Baseline:initiated today  Goal status: INITIAL   LONG TERM GOALS: Target date: 2 /25/26 12 weeks  Improve strength for B shoulder IR and ER to 4+/5 , from 3+/5 to improve endurance for UE activities Baseline:  Goal  status: INITIAL  2.  NDI improve from 76% disability to 84% or less Baseline:  Goal status: INITIAL  3.  Improve dynamic balance: FGA 27/30 or greater Baseline: TBD Goal status: INITIAL    PLAN:  PT FREQUENCY: 1x/week  PT DURATION: 12 weeks  PLANNED INTERVENTIONS: 97110-Therapeutic exercises, 97530- Therapeutic activity, 97112- Neuromuscular re-education, 97535- Self Care, [id number]- Manual therapy, and Patient/Family education  PLAN FOR NEXT SESSION: reassess B rotator cuff strength, manual techniques to reduce pain upper back/ periscapular, progress with periscapular and rotator cuff strengthening    Stann DELENA Ohara, PT 02/12/2024, 1:46 PM      "

## 2024-02-13 ENCOUNTER — Emergency Department (HOSPITAL_COMMUNITY)
Admission: EM | Admit: 2024-02-13 | Discharge: 2024-02-13 | Disposition: A | Discharge status: Home / Self Care | Source: Home / Self Care | Attending: Emergency Medicine | Admitting: Emergency Medicine

## 2024-02-13 ENCOUNTER — Other Ambulatory Visit: Payer: Self-pay

## 2024-02-13 ENCOUNTER — Emergency Department (HOSPITAL_COMMUNITY)

## 2024-02-13 DIAGNOSIS — Z7984 Long term (current) use of oral hypoglycemic drugs: Secondary | ICD-10-CM | POA: Insufficient documentation

## 2024-02-13 DIAGNOSIS — Z794 Long term (current) use of insulin: Secondary | ICD-10-CM | POA: Insufficient documentation

## 2024-02-13 DIAGNOSIS — J45909 Unspecified asthma, uncomplicated: Secondary | ICD-10-CM | POA: Insufficient documentation

## 2024-02-13 DIAGNOSIS — R059 Cough, unspecified: Secondary | ICD-10-CM | POA: Diagnosis present

## 2024-02-13 DIAGNOSIS — J189 Pneumonia, unspecified organism: Secondary | ICD-10-CM | POA: Diagnosis not present

## 2024-02-13 DIAGNOSIS — Z79899 Other long term (current) drug therapy: Secondary | ICD-10-CM | POA: Diagnosis not present

## 2024-02-13 DIAGNOSIS — I10 Essential (primary) hypertension: Secondary | ICD-10-CM | POA: Insufficient documentation

## 2024-02-13 DIAGNOSIS — Z87891 Personal history of nicotine dependence: Secondary | ICD-10-CM | POA: Diagnosis not present

## 2024-02-13 DIAGNOSIS — Z7951 Long term (current) use of inhaled steroids: Secondary | ICD-10-CM | POA: Diagnosis not present

## 2024-02-13 DIAGNOSIS — E119 Type 2 diabetes mellitus without complications: Secondary | ICD-10-CM | POA: Diagnosis not present

## 2024-02-13 LAB — URINALYSIS, ROUTINE W REFLEX MICROSCOPIC
Bilirubin Urine: NEGATIVE
Glucose, UA: NEGATIVE mg/dL
Hgb urine dipstick: NEGATIVE
Ketones, ur: NEGATIVE mg/dL
Leukocytes,Ua: NEGATIVE
Nitrite: NEGATIVE
Protein, ur: NEGATIVE mg/dL
Specific Gravity, Urine: 1.013 (ref 1.005–1.030)
pH: 5 (ref 5.0–8.0)

## 2024-02-13 LAB — CBC
HCT: 39.7 % (ref 39.0–52.0)
Hemoglobin: 12.9 g/dL — ABNORMAL LOW (ref 13.0–17.0)
MCH: 28.3 pg (ref 26.0–34.0)
MCHC: 32.5 g/dL (ref 30.0–36.0)
MCV: 87.1 fL (ref 80.0–100.0)
Platelets: 296 K/uL (ref 150–400)
RBC: 4.56 MIL/uL (ref 4.22–5.81)
RDW: 13.8 % (ref 11.5–15.5)
WBC: 5.3 K/uL (ref 4.0–10.5)
nRBC: 0 % (ref 0.0–0.2)

## 2024-02-13 LAB — BASIC METABOLIC PANEL WITH GFR
Anion gap: 11 (ref 5–15)
BUN: 12 mg/dL (ref 6–20)
CO2: 26 mmol/L (ref 22–32)
Calcium: 8.9 mg/dL (ref 8.9–10.3)
Chloride: 103 mmol/L (ref 98–111)
Creatinine, Ser: 0.66 mg/dL (ref 0.61–1.24)
GFR, Estimated: >60 mL/min
Glucose, Bld: 155 mg/dL — ABNORMAL HIGH (ref 70–99)
Potassium: 4 mmol/L (ref 3.5–5.1)
Sodium: 140 mmol/L (ref 135–145)

## 2024-02-13 LAB — RESP PANEL BY RT-PCR (RSV, FLU A&B, COVID)  RVPGX2
Influenza A by PCR: NEGATIVE
Influenza B by PCR: NEGATIVE
Resp Syncytial Virus by PCR: NEGATIVE
SARS Coronavirus 2 by RT PCR: NEGATIVE

## 2024-02-13 MED ORDER — DOXYCYCLINE HYCLATE 100 MG PO CAPS: 100.0000 mg | ORAL_CAPSULE | Freq: Two times a day (BID) | ORAL | 0 refills | Status: AC

## 2024-02-13 MED ORDER — DOXYCYCLINE HYCLATE 100 MG PO TABS
100.0000 mg | ORAL_TABLET | Freq: Once | ORAL | Status: AC
  Administered 2024-02-13: 100 mg via ORAL
  Filled 2024-02-13: qty 1

## 2024-02-13 NOTE — ED Notes (Signed)
 Discharge instructions reviewed with patient at this time. Follow up care and return precautions discussed. Pt verbalized understanding, denies questions. All belongings with patient at time of discharge. ABC's intact.

## 2024-02-13 NOTE — ED Notes (Signed)
 Patient transported to x-ray. ?

## 2024-02-14 ENCOUNTER — Encounter (HOSPITAL_BASED_OUTPATIENT_CLINIC_OR_DEPARTMENT_OTHER): Payer: Self-pay | Admitting: *Deleted

## 2024-02-14 ENCOUNTER — Emergency Department (HOSPITAL_BASED_OUTPATIENT_CLINIC_OR_DEPARTMENT_OTHER)
Admission: EM | Admit: 2024-02-14 | Discharge: 2024-02-14 | Disposition: A | Discharge status: Home / Self Care | Attending: Emergency Medicine | Admitting: Emergency Medicine

## 2024-02-14 ENCOUNTER — Other Ambulatory Visit: Payer: Self-pay

## 2024-02-14 ENCOUNTER — Other Ambulatory Visit (HOSPITAL_BASED_OUTPATIENT_CLINIC_OR_DEPARTMENT_OTHER): Payer: Self-pay

## 2024-02-14 DIAGNOSIS — R197 Diarrhea, unspecified: Secondary | ICD-10-CM | POA: Diagnosis not present

## 2024-02-14 DIAGNOSIS — K6289 Other specified diseases of anus and rectum: Secondary | ICD-10-CM | POA: Insufficient documentation

## 2024-02-14 MED ORDER — LOPERAMIDE HCL 2 MG PO CAPS
2.0000 mg | ORAL_CAPSULE | Freq: Four times a day (QID) | ORAL | 0 refills | Status: DC | PRN
  Filled 2024-02-14: qty 15, 4d supply, fill #0

## 2024-02-14 MED ORDER — LOPERAMIDE HCL 2 MG PO CAPS: 2.0000 mg | ORAL_CAPSULE | Freq: Four times a day (QID) | ORAL | 0 refills | Status: AC | PRN

## 2024-02-14 MED ORDER — HYDROCORTISONE (PERIANAL) 2.5 % EX CREA
1.0000 | TOPICAL_CREAM | Freq: Two times a day (BID) | CUTANEOUS | 0 refills | Status: DC
  Filled 2024-02-14: qty 30, 10d supply, fill #0

## 2024-02-14 MED ORDER — HYDROCORTISONE (PERIANAL) 2.5 % EX CREA: 1.0000 | TOPICAL_CREAM | Freq: Two times a day (BID) | CUTANEOUS | 0 refills | Status: AC

## 2024-02-14 NOTE — Discharge Instructions (Signed)
 Please read and follow all provided instructions.  Your diagnoses today include:  1. Diarrhea, unspecified type   2. Perirectal skin irritation     Tests performed today include: Vital signs. See below for your results today.   Medications prescribed:  Imodium: Medication to help slow diarrhea  Hydrocortisone cream: Topical medication to help soothe the skin  Take any prescribed medications only as directed.  Home care instructions:  Follow any educational materials contained in this packet.  Use Imodium to help slow diarrhea.  Take oral probiotics, such as yogurt, while taking antibiotic, to help prevent diarrhea and restore the bacteria in your GI tract.  2-3 times a day, sit in a warm water in a bathtub for 10 to 15 minutes.  Gently clean the rectal area with a baby wipe after bowel movements and after soaking.  Allow the area to dry completely and then apply the hydrocortisone ointment.  Follow-up instructions: Please follow-up with your primary care provider in the next 7 days for further evaluation of your symptoms.   Return instructions:  Please return to the Emergency Department if you experience worsening symptoms.  Please return if you have any other emergent concerns.  Additional Information:  Your vital signs today were: BP (!) 163/95   Pulse 97   Temp 98.3 F (36.8 C) (Oral)   SpO2 95%  If your blood pressure (BP) was elevated above 135/85 this visit, please have this repeated by your doctor within one month. --------------

## 2024-02-14 NOTE — ED Provider Notes (Signed)
 " [place] EMERGENCY DEPARTMENT AT [facility] Provider Note   CSN: [id number] Arrival date & time: 02/14/24  1024     Patient presents with: Diarrhea   Nathan [name] is a 54 y.o. male.   Patient presents to the emergency department today for evaluation of rectal pain.  Patient has had cough and URI symptoms ongoing for several days.  Patient was seen at emergency room yesterday, had a reassuring workup, appears to have been placed on doxycycline empirically for pneumonia.  Patient had 1 dose of this yesterday, has not yet filled the prescription.  Patient reports frequent episodes of watery stool, including stool that comes out when he coughs.  He has been wiping his rectal area frequently.  Patient has had increased discomfort.  He was concerned about associated skin breakdown noted by family member today.  No blood in the stool.  No fevers or vomiting.  Patient reports having diarrhea typically at baseline but current diarrhea is excessive for him.       Prior to Admission medications  Medication Sig Start Date End Date Taking? Authorizing Provider  hydrocortisone (ANUSOL-HC) 2.5 % rectal cream Place 1 Application rectally 2 (two) times daily. 02/14/24  Yes Desiderio Chew, PA-C  loperamide (IMODIUM) 2 MG capsule Take 1 capsule (2 mg total) by mouth 4 (four) times daily as needed for diarrhea or loose stools. 02/14/24  Yes Desiderio Chew, PA-C  albuterol  (PROVENTIL ) (2.5 MG/3ML) 0.083% nebulizer solution Take 3 mLs (2.5 mg total) by nebulization every 6 (six) hours as needed for wheezing or shortness of breath. 12/13/23   Hope Almarie ORN, NP  albuterol  (VENTOLIN  HFA) 108 (90 Base) MCG/ACT inhaler Inhale 2 puffs into the lungs every 6 (six) hours as needed for wheezing or shortness of breath. 03/08/22   Ruthell Lauraine FALCON, NP  amitriptyline (ELAVIL) 10 MG tablet Take 10 mg by mouth at bedtime. 02/22/23   [provider]  budeson-glycopyrrolate-formoterol  (BREZTRI   AEROSPHERE) 160-9-4.8 MCG/ACT AERO Inhale 2 puffs into the lungs 2 (two) times daily. 05/16/23   Shelah Lamar RAMAN, MD  celecoxib (CELEBREX) 100 MG capsule Take 100 mg by mouth 2 (two) times daily as needed (pain.).    [provider]  cetirizine  (ZYRTEC ) 10 MG tablet Take 10 mg by mouth in the morning. 04/05/23 04/04/24  [provider]  Cholecalciferol (VITAMIN D3) 10 MCG (400 UNIT) tablet Take 400 Units by mouth in the morning. 05/22/23   [provider]  cyclobenzaprine  (FLEXERIL ) 10 MG tablet Take 10 mg by mouth in the morning and at bedtime. 05/18/22   [provider]  Docusate Sodium (DSS) 100 MG CAPS Take 100 mg by mouth in the morning and at bedtime. 03/14/23   [provider]  doxycycline (VIBRAMYCIN) 100 MG capsule Take 1 capsule (100 mg total) by mouth 2 (two) times daily. 02/13/24   Patsey Lot, MD  Evolocumab  (REPATHA  SURECLICK) 140 MG/ML SOAJ Inject 140 mg into the skin every 14 (fourteen) days. 06/25/23   Thukkani, Arun K, MD  fluticasone  (FLONASE ) 50 MCG/ACT nasal spray Place 1 spray into both nostrils in the morning. 04/05/23   [provider]  folic acid  (FOLVITE ) 1 MG tablet Take 1 tablet (1 mg total) by mouth daily. 11/12/23   Jeannetta Lonni ORN, MD  furosemide  (LASIX ) 20 MG tablet Take 0.5 tablets (10 mg total) by mouth daily. 04/25/23   Thukkani, Arun K, MD  hydrocortisone 2.5 % ointment Apply 1 g topically 2 (two) times daily. 10/14/23  [provider]  metFORMIN (GLUCOPHAGE-XR) 500 MG 24 hr tablet Take 500 mg by mouth 2 (two) times daily. 08/24/21   [provider]  methotrexate  (RHEUMATREX) 2.5 MG tablet TAKE 8 TABLETS (20 MG TOTAL) BY MOUTH ONCE A WEEK. CAUTION:CHEMOTHERAPY. PROTECT FROM LIGHT. 01/14/24   Jeannetta Lonni ORN, MD  morphine  (MSIR) 15 MG tablet Take 15 mg by mouth 4 (four) times daily as needed. 11/15/23   [provider]  omeprazole (PRILOSEC) 20 MG capsule Take 20 mg by mouth daily before  breakfast. 05/29/22 09/25/26  [provider]  pravastatin (PRAVACHOL) 10 MG tablet Take 10 mg by mouth every evening. 09/14/22   [provider]  predniSONE  (DELTASONE ) 5 MG tablet TAKE 1 TABLET (5 MG TOTAL) BY MOUTH DAILY WITH BREAKFAST. 01/14/24   Jeannetta Lonni ORN, MD  pregabalin  (LYRICA ) 200 MG capsule Take 200 mg by mouth in the morning and at bedtime. 07/17/22   [provider]  Semaglutide , 2 MG/DOSE, (OZEMPIC , 2 MG/DOSE,) 8 MG/3ML SOPN Inject 2 mg into the skin once a week. 12/28/23   Thukkani, Arun K, MD  sulfamethoxazole -trimethoprim  (BACTRIM  DS) 800-160 MG tablet TAKE 1 TABLET BY MOUTH THREE TIMES WEEKLY (ON MONDAY, WEDNESDAY & FRIDAY) Patient taking differently: Take 1 tablet by mouth 3 (three) times a week. 06/02/23   Darlean Ozell NOVAK, MD  valsartan  (DIOVAN ) 80 MG tablet Take 1 tablet (80 mg total) by mouth daily. Patient taking differently: Take 80 mg by mouth every evening. 04/25/23   Thukkani, Arun K, MD    Allergies: Atorvastatin , Rosuvastatin , Tizanidine , Zetia [ezetimibe], Oxycodone -acetaminophen , and Percocet [oxycodone -acetaminophen ]    Review of Systems  Updated Vital Signs BP (!) 163/95   Pulse 97   Temp 98.3 F (36.8 C) (Oral)   SpO2 95%   Physical Exam Vitals and nursing note reviewed.  Constitutional:      Appearance: He is well-developed.  HENT:     Head: Normocephalic and atraumatic.  Eyes:     Conjunctiva/sclera: Conjunctivae normal.  Pulmonary:     Effort: No respiratory distress.     Comments: Occasional coughing during exam. Genitourinary:    Comments: Patient with macerated skin noted around the rectum, superiorly he has a small area of skin breakdown/ulceration or fissure.  No bleeding.  No evidence of perirectal abscess or drainage.  There is some brown stool noted.  Perirectal area is generally inflamed.   Musculoskeletal:     Cervical back: Normal range of motion and neck supple.  Skin:    General: Skin is warm and dry.   Neurological:     Mental Status: He is alert.     (all labs ordered are listed, but only abnormal results are displayed) Labs Reviewed - No data to display  EKG: None  Radiology: DG Chest 2 View Result Date: 02/13/2024 EXAM: 2 VIEW(S) XRAY OF THE CHEST 02/13/2024 12:28:00 PM COMPARISON: 10/12/2023. CLINICAL HISTORY: SOB. FINDINGS: LUNGS AND PLEURA: Stable chronic elevation of the right hemidiaphragm. Nodular densities project over both upper lobes, similar to prior CT from 02/04/2023. No acute confluent airspace opacity. No pleural effusion. No pneumothorax. HEART AND MEDIASTINUM: No acute abnormality of the cardiac and mediastinal silhouettes. BONES AND SOFT TISSUES: No acute osseous abnormality. IMPRESSION: 1. No acute cardiopulmonary abnormality. Electronically signed by: Franky Crease MD 02/13/2024 01:04 PM EST RP Workstation: [id number]     Procedures   Medications Ordered in the ED - No data to display  ED Course  Patient seen and examined. History obtained directly from  patient. Patient readily showed me the problem area declining chaperone.  Reviewed x-ray performed yesterday as well as lab work.  Labs/EKG: None ordered  Imaging: None ordered  Medications/Fluids: None ordered  Most recent vital signs reviewed and are as follows: BP (!) 163/95   Pulse 97   Temp 98.3 F (36.8 C) (Oral)   SpO2 95%   Initial impression: Skin breakdown and rectal irritation from ongoing diarrhea and friction injury.  Home treatment plan: Imodium to slow diarrhea, encouraged probiotics.  We discussed sitz bath's several times a day, drying the area fully and applying hydrocortisone cream.  Discussed use of wet wipes.  Return instructions discussed with patient: Worsening pain, blood in the stool, new or worsening symptoms.  Follow-up instructions discussed with patient: PCP as needed                                  Medical Decision Making Risk Prescription drug  management.   Patient with rectal irritation and minor skin breakdown due to recent diarrhea and friction.  No evidence of abscess.  No blood noted in the stool.  Ongoing diarrhea is likely a part of his current illness.  He only has had 1 dose of antibiotics to this point.  I have low concern for C. difficile.  No anterior abdominal pain.  Discussed treatment strategy as above.     Final diagnoses:  Diarrhea, unspecified type  Perirectal skin irritation    ED Discharge Orders          Ordered    loperamide (IMODIUM) 2 MG capsule  4 times daily PRN        02/14/24 1057    hydrocortisone (ANUSOL-HC) 2.5 % rectal cream  2 times daily        02/14/24 1057               Desiderio Chew, PA-C 02/14/24 1106    Doretha Folks, MD 02/14/24 1448  "

## 2024-02-14 NOTE — ED Triage Notes (Signed)
 Pt to ED reporting recent pneumonia dx and PO antibiotic use. Pt has been having diarrhea and reports concerns that he is having skin break down around his rectum that he wanted assessed.

## 2024-02-15 ENCOUNTER — Telehealth: Payer: Self-pay | Admitting: Internal Medicine

## 2024-02-15 NOTE — Telephone Encounter (Signed)
 Pt called stating he went to the ER on 12/31 and patient would like to know if he should come in for his appointment on the 7th since he is on antibiotic now.

## 2024-02-18 NOTE — Telephone Encounter (Signed)
 Sorry for delayed response. I agree he would be best to reschedule. Ideally at least 2 weeks after finishing the antibiotics. He can also hold the methotrexate  until finishing antibiotics and then resume afterwards either right away or his next usually scheduled dose.

## 2024-02-18 NOTE — ED Provider Notes (Signed)
 " [place] [facility] Provider Note   CSN: [id number] Arrival date & time: 02/13/24  1147     Patient presents with: Cough, Chest Pain, and Nasal Congestion   Nathan [name] is a 54 y.o. male.    Cough Associated symptoms: chest pain   Chest Pain Associated symptoms: cough   Patient presents with cough and chest pain.  Has had for the last few days.  Some diarrhea.  No definite sick contacts.   Past Medical History:  Diagnosis Date   Acid reflux disease    Arthritis    Asthma    'I think I might have asthma   Chronic back pain    Diabetes mellitus without complication (HCC)    Dyspnea    chronic   Hyperlipidemia    Hypertension    Sarcoidosis     Prior to Admission medications  Medication Sig Start Date End Date Taking? Authorizing Provider  doxycycline  (VIBRAMYCIN ) 100 MG capsule Take 1 capsule (100 mg total) by mouth 2 (two) times daily. 02/13/24  Yes Patsey Lot, MD  albuterol  (PROVENTIL ) (2.5 MG/3ML) 0.083% nebulizer solution Take 3 mLs (2.5 mg total) by nebulization every 6 (six) hours as needed for wheezing or shortness of breath. 12/13/23   Hope Almarie ORN, NP  albuterol  (VENTOLIN  HFA) 108 [phone] Base) MCG/ACT inhaler Inhale 2 puffs into the lungs every 6 (six) hours as needed for wheezing or shortness of breath. 03/08/22   Ruthell Lauraine FALCON, NP  amitriptyline (ELAVIL) 10 MG tablet Take 10 mg by mouth at bedtime. 02/22/23   [provider]  budeson-glycopyrrolate-formoterol  (BREZTRI  AEROSPHERE) 160-9-4.8 MCG/ACT AERO Inhale 2 puffs into the lungs 2 (two) times daily. 05/16/23   Shelah Lamar RAMAN, MD  celecoxib (CELEBREX) 100 MG capsule Take 100 mg by mouth 2 (two) times daily as needed (pain.).    [provider]  cetirizine  (ZYRTEC ) 10 MG tablet Take 10 mg by mouth in the morning. 04/05/23 04/04/24  [provider]  Cholecalciferol (VITAMIN D3) 10 MCG (400 UNIT) tablet Take 400 Units by mouth in the  morning. 05/22/23   [provider]  cyclobenzaprine  (FLEXERIL ) 10 MG tablet Take 10 mg by mouth in the morning and at bedtime. 05/18/22   [provider]  Docusate Sodium (DSS) 100 MG CAPS Take 100 mg by mouth in the morning and at bedtime. 03/14/23   [provider]  Evolocumab  (REPATHA  SURECLICK) 140 MG/ML SOAJ Inject 140 mg into the skin every 14 (fourteen) days. 06/25/23   Thukkani, Arun K, MD  fluticasone  (FLONASE ) 50 MCG/ACT nasal spray Place 1 spray into both nostrils in the morning. 04/05/23   [provider]  folic acid  (FOLVITE ) 1 MG tablet Take 1 tablet (1 mg total) by mouth daily. 11/12/23   Jeannetta Lonni ORN, MD  furosemide  (LASIX ) 20 MG tablet Take 0.5 tablets (10 mg total) by mouth daily. 04/25/23   Thukkani, Arun K, MD  hydrocortisone  (ANUSOL -HC) 2.5 % rectal cream Place 1 Application rectally 2 (two) times daily. 02/14/24   Desiderio Chew, PA-C  hydrocortisone  2.5 % ointment Apply 1 g topically 2 (two) times daily. 10/14/23   [provider]  loperamide  (IMODIUM ) 2 MG capsule Take 1 capsule (2 mg total) by mouth 4 (four) times daily as needed for diarrhea or loose stools. 02/14/24   Geiple, Joshua, PA-C  metFORMIN (GLUCOPHAGE-XR) 500 MG 24 hr tablet Take 500 mg by mouth 2 (two) times daily. 08/24/21   [provider]  methotrexate  (RHEUMATREX) 2.5 MG tablet TAKE 8 TABLETS (20 MG TOTAL) BY MOUTH ONCE A WEEK. CAUTION:CHEMOTHERAPY. PROTECT FROM LIGHT. 01/14/24   Jeannetta Lonni ORN, MD  morphine  (MSIR) 15 MG tablet Take 15 mg by mouth 4 (four) times daily as needed. 11/15/23   [provider]  omeprazole (PRILOSEC) 20 MG capsule Take 20 mg by mouth daily before breakfast. 05/29/22 09/25/26  [provider]  pravastatin (PRAVACHOL) 10 MG tablet Take 10 mg by mouth every evening. 09/14/22   [provider]  predniSONE  (DELTASONE ) 5 MG tablet TAKE 1 TABLET (5 MG TOTAL) BY MOUTH DAILY WITH BREAKFAST. 01/14/24   Jeannetta Lonni ORN, MD  pregabalin  (LYRICA ) 200 MG capsule Take 200 mg by mouth in the morning and at bedtime. 07/17/22   [provider]  Semaglutide , 2 MG/DOSE, (OZEMPIC , 2 MG/DOSE,) 8 MG/3ML SOPN Inject 2 mg into the skin once a week. 12/28/23   Thukkani, Arun K, MD  sulfamethoxazole -trimethoprim  (BACTRIM  DS) 800-160 MG tablet TAKE 1 TABLET BY MOUTH THREE TIMES WEEKLY (ON MONDAY, WEDNESDAY & FRIDAY) Patient taking differently: Take 1 tablet by mouth 3 (three) times a week. 06/02/23   Darlean Ozell NOVAK, MD  valsartan  (DIOVAN ) 80 MG tablet Take 1 tablet (80 mg total) by mouth daily. Patient taking differently: Take 80 mg by mouth every evening. 04/25/23   Thukkani, Arun K, MD    Allergies: Atorvastatin , Rosuvastatin , Tizanidine , Zetia [ezetimibe], Oxycodone -acetaminophen , and Percocet [oxycodone -acetaminophen ]    Review of Systems  Respiratory:  Positive for cough.   Cardiovascular:  Positive for chest pain.    Updated Vital Signs BP 139/71   Pulse 87   Temp 98.7 F (37.1 C) (Oral)   Resp 16   Ht 5' 8 (1.727 m)   Wt 105.7 kg   SpO2 100%   BMI 35.43 kg/m   Physical Exam Vitals and nursing note reviewed.  Cardiovascular:     Rate and Rhythm: Regular rhythm.  Pulmonary:     Breath sounds: Rhonchi present.  Neurological:     Mental Status: He is alert.   Patient with harsh breath sounds on left side compared to right.  (all labs ordered are listed, but only abnormal results are displayed) Labs Reviewed  BASIC METABOLIC PANEL WITH GFR - Abnormal; Notable for the following components:      Result Value   Glucose, Bld 155 (*)    All other components within normal limits  CBC - Abnormal; Notable for the following components:   Hemoglobin 12.9 (*)    All other components within normal limits  RESP PANEL BY RT-PCR (RSV, FLU A&B, COVID)  RVPGX2  URINALYSIS, ROUTINE W REFLEX MICROSCOPIC    EKG: EKG Interpretation Date/Time:  Wednesday February 13 2024 11:58:34 EST Ventricular Rate:   96 PR Interval:  172 QRS Duration:  104 QT Interval:  350 QTC Calculation: 442 R Axis:   -12  Text Interpretation: Normal sinus rhythm Minimal voltage criteria for LVH, may be normal variant ( Cornell product ) Borderline ECG No significant change was found Confirmed by Ellouise Fine ([id number]) on 02/13/2024 1:45:59 PM  Radiology: No results found.   Procedures   Medications Ordered in the ED  doxycycline  (VIBRA -TABS) tablet 100 mg (100 mg Oral Given 02/13/24 2036)                                    Medical Decision Making Amount and/or Complexity  of Data Reviewed Labs: ordered. Radiology: ordered.  Risk Prescription drug management.   Patient with URI symptoms cough.  Former smoker.  History of sarcoid.  He does have localizing lung findings with negative flu COVID and RSV.  X-ray does not show pneumonia but with the clinical findings we will treat as a pneumonia.  Appears stable for discharge home however     Final diagnoses:  Community acquired pneumonia, unspecified laterality    ED Discharge Orders          Ordered    doxycycline  (VIBRAMYCIN ) 100 MG capsule  2 times daily        02/13/24 1948               Patsey Lot, MD 02/18/24 1502  "

## 2024-02-18 NOTE — Telephone Encounter (Signed)
 Patient advised sorry for delayed response. Dr. Jeannetta agrees he would be best to reschedule. Ideally at least 2 weeks after finishing the antibiotics. He can also hold the methotrexate  until finishing antibiotics and then resume afterwards either right away or his next usually scheduled dose. Patient rescheduled for 04/01/2024

## 2024-02-18 NOTE — Telephone Encounter (Signed)
 Pt would like to know if he should come to his appointment on the 7th. Patients second time calling. pt would like to know an answer by today.

## 2024-02-19 ENCOUNTER — Ambulatory Visit: Admitting: Physical Therapy

## 2024-02-19 ENCOUNTER — Telehealth: Payer: Self-pay

## 2024-02-19 NOTE — Telephone Encounter (Signed)
 ATC x1.  No answer, line rang and then went to a busy signal.  No option to leave a message.  He was in the ED on 02/13/24, he was sent home on Doxycycline  100 mg 2 times per day, 13 capsules were sent in so he should still be on this medication.  This was prescribed for PNA. There is no problem taking Doxycycline  and Prednisone  together.  I also see Bactrim  on his med list from 06/02/23 prescribed by Dr. Darlean (Monday/Wednesday/Friday), 12 tablets with 11 refills.  Prednisone  was prescribed by Rheumatology, Dr. Lonni Ester that treats him for Sarcoidosis.  Mychart message sent to patient since I could not reach by phone.

## 2024-02-19 NOTE — Telephone Encounter (Signed)
 Copied from CRM [id number]. Topic: Clinical - Medication Question >> Feb 19, 2024 10:33 AM Isabell A wrote: Reason for CRM: Patient would like to confirm if its ok to take prednisone  with antibiotics he was prescribed for acute pneumonia.   Previous CRM sent to clinical pool yesterday but still hasn't heard back from anyone.     Callback number: [phone]

## 2024-02-20 ENCOUNTER — Ambulatory Visit: Admitting: Internal Medicine

## 2024-02-20 ENCOUNTER — Telehealth: Payer: Self-pay

## 2024-02-20 DIAGNOSIS — D860 Sarcoidosis of lung: Secondary | ICD-10-CM

## 2024-02-20 DIAGNOSIS — Z79899 Other long term (current) drug therapy: Secondary | ICD-10-CM

## 2024-02-20 DIAGNOSIS — N529 Male erectile dysfunction, unspecified: Secondary | ICD-10-CM

## 2024-02-20 DIAGNOSIS — M4802 Spinal stenosis, cervical region: Secondary | ICD-10-CM

## 2024-02-20 DIAGNOSIS — D863 Sarcoidosis of skin: Secondary | ICD-10-CM

## 2024-02-20 DIAGNOSIS — Z7952 Long term (current) use of systemic steroids: Secondary | ICD-10-CM

## 2024-02-20 NOTE — Telephone Encounter (Signed)
 Copied from CRM [id number]. Topic: Clinical - Medication Question >> Feb 18, 2024  1:04 PM Benton KIDD wrote: Reason for CRM: patient is calling because he went to emergency room on December 31st and and found out he has a acute case pneumonia. they gave him some antibiotics and wanted to know is it ok to  take the antibiotics and prednisone  or should he wait till after he finishes the antibiotics then start the prednisone  back . Please give patient a call concerning this information [phone]    Tried to reach out to patient ( phone rang then busy signal x2 )  Please take medication as prescribed

## 2024-02-21 ENCOUNTER — Ambulatory Visit: Attending: Orthopedic Surgery | Admitting: Physical Therapy

## 2024-02-21 DIAGNOSIS — M6281 Muscle weakness (generalized): Secondary | ICD-10-CM | POA: Insufficient documentation

## 2024-02-21 DIAGNOSIS — M542 Cervicalgia: Secondary | ICD-10-CM | POA: Diagnosis present

## 2024-02-21 DIAGNOSIS — R262 Difficulty in walking, not elsewhere classified: Secondary | ICD-10-CM | POA: Insufficient documentation

## 2024-02-21 NOTE — Therapy (Signed)
 " OUTPATIENT PHYSICAL THERAPY CERVICAL TREATMENT   Patient Name: Nathan [name] MRN: [id number] DOB:Feb 11, 1971, 54 y.o., male Today's Date: 02/21/2024  END OF SESSION:  PT End of Session - 02/21/24 1437     Visit Number 3    Date for Recertification  04/09/24    PT Start Time 1440    PT Stop Time 1525    PT Time Calculation (min) 45 min           Past Medical History:  Diagnosis Date   Acid reflux disease    Arthritis    Asthma    'I think I might have asthma   Chronic back pain    Diabetes mellitus without complication (HCC)    Dyspnea    chronic   Hyperlipidemia    Hypertension    Sarcoidosis    Past Surgical History:  Procedure Laterality Date   ANTERIOR CERVICAL DECOMP/DISCECTOMY FUSION N/A 10/03/2023   Procedure: ANTERIOR CERVICAL DECOMPRESSION/DISCECTOMY FUSION 2 LEVELS;  Surgeon: Beuford Anes, MD;  Location: [facility] OR;  Service: Orthopedics;  Laterality: N/A;  ANTERIOR CERVICAL DECOMPRESSION FUSION CERVICAL 4- CERVICAL 5, CERVICAL 5- CERVICAL 6 WITH INSTRUMENTATION AND ALLOGRAFT   BACK SURGERY     COLONOSCOPY     DENTAL SURGERY     NO PAST SURGERIES     Patient Active Problem List   Diagnosis Date Noted   Chronic dyspnea 11/01/2023   Sickle cell trait 11/01/2023   Vitamin D  deficiency 11/01/2023   Sarcoidosis of skin (HCC) 11/01/2023   Hypotestosteronemia 08/27/2023   Type 2 diabetes mellitus without complication, without long-term current use of insulin  (HCC) 06/22/2023   Erectile dysfunction 06/19/2023   Chronic pain of multiple joints 01/10/2023   Myalgia 08/29/2022   LPRD (laryngopharyngeal reflux disease) 05/29/2022   Pharyngoesophageal dysphagia 05/29/2022   Spinal stenosis, cervical region 11/25/2021   High risk medication use 11/15/2021   HLD (hyperlipidemia) 09/13/2021   Asthma 11/10/2020   Neuropathy 08/10/2020   MDD (major depressive disorder) 08/05/2020   GAD (generalized anxiety disorder) 08/05/2020   Panic disorder 08/05/2020    GERD (gastroesophageal reflux disease) 06/23/2020   Seasonal allergies 06/23/2020   Shoulder pain, left 10/23/2016   Cervical disc disease 06/14/2016   Upper airway cough syndrome 07/23/2014   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 Doing HEP. Day to day do not know how my body will be Had the fusion August ,  it really helped the L upper traps pain I was experiencing.  Now pain more in R upper traps .Noted decreased endurance for holding items in B hands , I just end up having to drop things   Hand dominance: Right  PERTINENT HISTORY:  Referred by orthopedist following cervical fusion  PAIN:  Are you having pain? Yes: NPRS scale: 9-10 Pain location: lower cervical spine  Pain description: more on R side, deep ache  Aggravating factors: prolonged standing any prolonged activity Relieving factors: takes some pain meds which don't help   PRECAUTIONS: None  RED FLAGS: None     WEIGHT BEARING RESTRICTIONS: No  FALLS:  Has patient fallen in last 6 months? No  LIVING ENVIRONMENT: Lives with: lives with their family Lives in: House/apartment Stairs: has stairs doesn't use  Has following equipment at home: None  OCCUPATION: disabled   PLOF: Independent and Independent with gait  PATIENT GOALS: reduce lower cervical spine pain and improve activity tolerance  NEXT MD VISIT: unknown  OBJECTIVE:  Note: Objective measures  were completed at Evaluation unless otherwise noted.  DIAGNOSTIC FINDINGS:  na  PATIENT SURVEYS:  Neck Disability Index score: 38 / 50 = 76.0 %  COGNITION: Overall cognitive status: Within functional limits for tasks assessed  SENSATION: Wnl B UE's  POSTURE: mild forward head, sloped, depressed B shoulders  PALPATION: Thickened tissue throughout R upper traps and middle traps, non tender specifically with palpation, pt reports more deep pain   CERVICAL ROM:   Active ROM A/PROM (deg) eval 02/21/24  Flexion 80 85  Extension 50 65  Right lateral flexion    Left lateral flexion    Right rotation 65 65  Left rotation 75 72   (Blank rows = not tested)  UPPER EXTREMITY ROM:  Active ROM Right eval Left eval RT/left 02/21/24   Shoulder flexion 125 140 148/140  Shoulder extension     Shoulder abduction 125 140   Shoulder adduction     Shoulder extension     Shoulder internal rotation : hand behind back  PSIS  PSIS   Shoulder external rotation hand behind head  Tight end range  Tight end range    Elbow flexion     Elbow extension     Wrist flexion     Wrist extension     Wrist ulnar deviation     Wrist radial deviation     Wrist pronation     Wrist supination      (Blank rows = not tested)  UPPER EXTREMITY MMT:  MMT Right eval Left eval  Shoulder flexion wfl wfl  Shoulder extension wfl wfl  Shoulder abduction wfl wfl  Shoulder adduction    Shoulder extension    Shoulder internal rotation 3+ 3+  Shoulder external rotation 3+ 3+  Middle trapezius    Lower trapezius    Elbow flexion wfl wfl  Elbow extension wfl wfl  Wrist flexion wfl wfl  Wrist extension wfl wfl  Wrist ulnar deviation    Wrist radial deviation    Wrist pronation    Wrist supination    Grip strength     (Blank rows = not tested)  CERVICAL SPECIAL TESTS:  na  FUNCTIONAL TESTS:  Tandem standing 30 sec , sway with R foot post Single leg stance 30 sec each, marked sway on R  Heel/toe  walking, unable to heel walk and maintain balance  FGA (02/12/24): 23/30  2 2. 3 3. 3 4. 3 5. 3 6. 2 7. 0 8. 1 9. 3 10. 3   TREATMENT DATE:   02/21/23 Checked ROM and goals STW to BIL cerv and UT to relax tightness and help with pain, no TP noted but generalized tightness throughout UBE L 2 2.5 min each way Ball vs wall CW and CCW 5 x each Red tband BIL 3 way scap stab on wall 10 x each Seated row 2 sets 10 20# Lat Pull Down 2 sets 10 20# Red tband shld ext 2 sets 10 3# BIL ER 2 sets 3# BIL PNF 2 sets 10  02/12/24 -UBE x5 mins FW -upper trap stretch in sitting 10x10s Bil -median nn glide 2x15 Bil  -Slouch overcorrect x20 in sitting -physioball wall walks with OH press into ball 3x8, 2 sec hold -Functional Gait assessment administered -Manual soft tissue mobilization to the right upper trap, inc resting tension at base of c/s, improved tissue mobility and subjective improvement with tx.    01/16/24 Evaluation,  Instructed in theraband B shoulder ER, yellow  and IR red, and rationale for these exercises                                                                                                                              PATIENT EDUCATION:  Education details: POC, goals Person educated: Patient Education method: Explanation, Demonstration, Tactile cues, and Verbal cues Education comprehension: verbalized understanding, returned demonstration, verbal cues required, and tactile cues required  HOME EXERCISE PROGRAM: Access Code: [email or web address] URL: [email or web address] Date: 01/16/2024 Prepared by: Greig Credit  Exercises - Seated Shoulder External Rotation AAROM with Dowel  - 1 x daily - 7 x weekly - 3 sets - 10 reps - Seated Shoulder Full Range Internal Rotation with Anchored Resistance  - 1 x daily - 7 x weekly - 3 sets - 10 reps  ASSESSMENT:  CLINICAL IMPRESSION: Pnt with 10/10 pain , states right after surgery left pain was better but now  returning. States he uses heat at home and trying to get new batteries and pads for TENS unit. Verb doing HEP so STG met. AROM checked and close to same as eval. Progressed ex for strength and ROM with cuing  EVAL: Patient is a 54 y.o. male who was evaluated today by physical therapy for assistance with rehabilitation following cervical fusion in August, which makes him 5 months post op.  His chief complaint is of lingering pain , deep at base of cervical spine posteriorly and radiating outward.  Also reports balance deficits, and difficulty with sustaining strength UE's for functional tasks, his arms and hands fatigue quickly . Evaluation reveals strength deficits B rotator cuff musculature, as well as some loss of ability with righting reactions. Would recommend an FGA assessment for more comprehensive dynamic balance assessment in future sessions. He should benefit from  physical therapy to address his strength deficits and assist him with pain management, balance  OBJECTIVE IMPAIRMENTS: decreased activity tolerance, decreased balance, decreased endurance, decreased knowledge of condition, difficulty walking, decreased ROM, decreased strength, hypomobility, increased fascial restrictions, impaired perceived functional ability, increased muscle spasms, impaired flexibility, impaired UE functional use, postural dysfunction, and pain.   ACTIVITY LIMITATIONS: carrying, lifting, standing, sleeping, dressing, and reach over head  PARTICIPATION LIMITATIONS: cleaning, laundry, driving, and community activity  PERSONAL FACTORS: 1-2 comorbidities: sarcodosis and methotrexate  are also affecting patient's functional outcome.   REHAB POTENTIAL: Good  CLINICAL DECISION MAKING: Evolving/moderate complexity  EVALUATION COMPLEXITY: Moderate   GOALS: Goals reviewed with patient? Yes  SHORT TERM GOALS: Target date: 2 weeks, 01/30/24  I HEP Baseline:initiated today  Goal status: 02/21/24 MET   LONG TERM  GOALS: Target date: 2 /25/26 12 weeks  Improve strength for B shoulder IR and ER to 4+/5 , from 3+/5 to improve endurance for UE activities Baseline:  Goal status: INITIAL  2.  NDI improve from 76% disability to 84% or less Baseline:  Goal status: INITIAL  3.  Improve dynamic balance: FGA 27/30 or greater Baseline: TBD Goal status: INITIAL    PLAN:  PT FREQUENCY: 1x/week  PT DURATION: 12 weeks  PLANNED INTERVENTIONS: 97110-Therapeutic exercises, 97530- Therapeutic activity, 97112- Neuromuscular re-education, 97535- Self Care, [id number]- Manual therapy, and Patient/Family education  PLAN FOR NEXT SESSION: progress B rotator cuff strength, manual techniques to reduce pain upper back/ periscapular, progress with periscapular and rotator cuff strengthening    Wassim Kirksey,ANGIE, PTA 02/21/2024, 2:39 PM      "

## 2024-02-26 ENCOUNTER — Ambulatory Visit: Admitting: Physical Therapy

## 2024-02-26 DIAGNOSIS — M542 Cervicalgia: Secondary | ICD-10-CM

## 2024-02-26 DIAGNOSIS — M6281 Muscle weakness (generalized): Secondary | ICD-10-CM | POA: Diagnosis not present

## 2024-02-26 DIAGNOSIS — R262 Difficulty in walking, not elsewhere classified: Secondary | ICD-10-CM

## 2024-02-26 NOTE — Therapy (Signed)
 " OUTPATIENT PHYSICAL THERAPY CERVICAL TREATMENT   Patient Name: Nathan [name] MRN: [id number] DOB:[birth year], 54 y.o., male Today's Date: 02/26/2024  END OF SESSION:  PT End of Session - 02/26/24 1226     Visit Number 4    Date for Recertification  04/09/24    PT Start Time 1230    PT Stop Time 1310    PT Time Calculation (min) 40 min           Past Medical History:  Diagnosis Date   Acid reflux disease    Arthritis    Asthma    'I think I might have asthma   Chronic back pain    Diabetes mellitus without complication (HCC)    Dyspnea    chronic   Hyperlipidemia    Hypertension    Sarcoidosis    Past Surgical History:  Procedure Laterality Date   ANTERIOR CERVICAL DECOMP/DISCECTOMY FUSION N/A 10/03/2023   Procedure: ANTERIOR CERVICAL DECOMPRESSION/DISCECTOMY FUSION 2 LEVELS;  Surgeon: Beuford Anes, MD;  Location: [facility] OR;  Service: Orthopedics;  Laterality: N/A;  ANTERIOR CERVICAL DECOMPRESSION FUSION CERVICAL 4- CERVICAL 5, CERVICAL 5- CERVICAL 6 WITH INSTRUMENTATION AND ALLOGRAFT   BACK SURGERY     COLONOSCOPY     DENTAL SURGERY     NO PAST SURGERIES     Patient Active Problem List   Diagnosis Date Noted   Chronic dyspnea 11/01/2023   Sickle cell trait 11/01/2023   Vitamin D  deficiency 11/01/2023   Sarcoidosis of skin (HCC) 11/01/2023   Hypotestosteronemia 08/27/2023   Type 2 diabetes mellitus without complication, without long-term current use of insulin  (HCC) 06/22/2023   Erectile dysfunction 06/19/2023   Chronic pain of multiple joints 01/10/2023   Myalgia 08/29/2022   LPRD (laryngopharyngeal reflux disease) 05/29/2022   Pharyngoesophageal dysphagia 05/29/2022   Spinal stenosis, cervical region 11/25/2021   High risk medication use 11/15/2021   HLD (hyperlipidemia) 09/13/2021   Asthma 11/10/2020   Neuropathy 08/10/2020   MDD (major depressive disorder) 08/05/2020   GAD (generalized anxiety disorder) 08/05/2020   Panic disorder 08/05/2020    GERD (gastroesophageal reflux disease) 06/23/2020   Seasonal allergies 06/23/2020   Shoulder pain, left 10/23/2016   Cervical disc disease 06/14/2016   Upper airway cough syndrome 07/23/2014   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 Still hurting alot   Had the fusion August ,  it really helped the L upper traps pain I was experiencing.  Now pain more in R upper traps .Noted decreased endurance for holding items in B hands , I just end up having to drop things   Hand dominance: Right  PERTINENT HISTORY:  Referred by orthopedist following cervical fusion  PAIN:  Are you having pain? Yes: NPRS scale: 9-10 Pain location: lower cervical spine  Pain description: more on R side, deep ache  Aggravating factors: prolonged standing any prolonged activity Relieving factors: takes some pain meds which don't help   PRECAUTIONS: None  RED FLAGS: None     WEIGHT BEARING RESTRICTIONS: No  FALLS:  Has patient fallen in last 6 months? No  LIVING ENVIRONMENT: Lives with: lives with their family Lives in: House/apartment Stairs: has stairs doesn't use  Has following equipment at home: None  OCCUPATION: disabled   PLOF: Independent and Independent with gait  PATIENT GOALS: reduce lower cervical spine pain and improve activity tolerance  NEXT MD VISIT: unknown  OBJECTIVE:  Note: Objective measures were completed at Evaluation unless  otherwise noted.  DIAGNOSTIC FINDINGS:  na  PATIENT SURVEYS:  Neck Disability Index score: 38 / 50 = 76.0 %  COGNITION: Overall cognitive status: Within functional limits for tasks assessed  SENSATION: Wnl B UE's  POSTURE: mild forward head, sloped, depressed B shoulders  PALPATION: Thickened tissue throughout R upper traps and middle traps, non tender specifically with palpation, pt reports more deep pain   CERVICAL ROM:   Active ROM A/PROM (deg) eval 02/21/24  Flexion 80 85  Extension 50 65  Right lateral flexion    Left lateral flexion    Right rotation 65 65  Left rotation 75 72   (Blank rows = not tested)  UPPER EXTREMITY ROM:  Active ROM Right eval Left eval RT/left 02/21/24   Shoulder flexion 125 140 148/140  Shoulder extension     Shoulder abduction 125 140   Shoulder adduction     Shoulder extension     Shoulder internal rotation : hand behind back  PSIS  PSIS   Shoulder external rotation hand behind head  Tight end range  Tight end range    Elbow flexion     Elbow extension     Wrist flexion     Wrist extension     Wrist ulnar deviation     Wrist radial deviation     Wrist pronation     Wrist supination      (Blank rows = not tested)  UPPER EXTREMITY MMT:  MMT Right eval Left eval  Shoulder flexion wfl wfl  Shoulder extension wfl wfl  Shoulder abduction wfl wfl  Shoulder adduction    Shoulder extension    Shoulder internal rotation 3+ 3+  Shoulder external rotation 3+ 3+  Middle trapezius    Lower trapezius    Elbow flexion wfl wfl  Elbow extension wfl wfl  Wrist flexion wfl wfl  Wrist extension wfl wfl  Wrist ulnar deviation    Wrist radial deviation    Wrist pronation    Wrist supination    Grip strength     (Blank rows = not tested)  CERVICAL SPECIAL TESTS:  na  FUNCTIONAL TESTS:  Tandem standing 30 sec , sway with R foot post Single leg stance 30 sec each, marked sway on R Heel/toe walking, unable to heel walk and  maintain balance  FGA (02/12/24): 23/30  2 2. 3 3. 3 4. 3 5. 3 6. 2 7. 0 8. 1 9. 3 10. 3   TREATMENT DATE:   02/26/24 UBE L 2 fwd and 3 min backward MMT goals- no changed form eval Increased HEP- see below. Red tband and handout issued Seated row 2 sets 10 20# Lat Pull Down 2 sets 10 20# Tricep ext 2 sets 10 Bicep curl 2 sets 10 Ball vs wall 5 x CW and CCW Cuing for form throughout STW to BIL UT and cervical in sitting   02/21/23 Checked ROM and goals STW to BIL cerv and UT to relax tightness and help with pain, no TP noted but generalized tightness throughout UBE L 2 2.5 min each way Ball vs wall CW and CCW 5 x each Red tband BIL 3 way scap stab on wall 10 x each Seated row 2 sets 10 20# Lat Pull Down 2 sets 10 20# Red tband shld ext 2 sets 10 3# BIL ER 2 sets 3# BIL PNF 2 sets 10  02/12/24 -UBE x5 mins FW -upper trap stretch in sitting 10x10s Bil -median nn glide 2x15 Bil  -Slouch overcorrect x20 in sitting -physioball wall walks with OH press into ball 3x8, 2 sec hold -Functional Gait assessment administered -Manual soft tissue mobilization to the right upper trap, inc resting tension at base of c/s, improved tissue mobility and subjective improvement with tx.    01/16/24 Evaluation,  Instructed in theraband B shoulder ER, yellow  and IR red, and rationale for these exercises                                                                                                                              PATIENT EDUCATION:  Education details: POC, goals Person educated: Patient Education method: Explanation, Demonstration, Tactile cues, and Verbal cues Education comprehension: verbalized understanding, returned demonstration, verbal cues required, and tactile cues required  HOME EXERCISE PROGRAM: Access Code: [email or web address] URL: [email or web address] Date: 02/26/2024 Prepared by: Myrtice Lowdermilk  Exercises - Shoulder extension with  resistance - Neutral  - 1 x daily - 7 x weekly - 2 sets - 10 reps - Standing Shoulder Row with Anchored Resistance  - 1 x daily - 7 x weekly - 2 sets - 10 reps - Seated Shoulder Horizontal Abduction with Resistance - Palms Down  - 1 x daily - 7 x weekly - 2 sets - 10 reps - Seated Shoulder W External Rotation on Swiss Ball  - 1 x daily - 7 x weekly - 2 sets - 10 reps - Wall Angels  - 1 x daily - 7 x weekly - 2 sets - 10 reps - Bird Dog on Counter  - 1 x daily - 7 x weekly - 2 sets - 10 reps Access Code: [email or web address] URL: [email or web address] Date: 01/16/2024 Prepared by: Greig Credit  Exercises - Seated Shoulder External Rotation AAROM with Dowel  -  1 x daily - 7 x weekly - 3 sets - 10 reps - Seated Shoulder Full Range Internal Rotation with Anchored Resistance  - 1 x daily - 7 x weekly - 3 sets - 10 reps  ASSESSMENT:  CLINICAL IMPRESSION: Pnt returns with continued pain. Verb its day to day with his body. Addressed MMT goals and no significant changes from eval. Progressed HEP and issued handout, progressed ex in clinic or ROM and strength with cuing needed   EVAL: Patient is a 54 y.o. male who was evaluated today by physical therapy for assistance with rehabilitation following cervical fusion in August, which makes him 5 months post op.  His chief complaint is of lingering pain , deep at base of cervical spine posteriorly and radiating outward.  Also reports balance deficits, and difficulty with sustaining strength UE's for functional tasks, his arms and hands fatigue quickly . Evaluation reveals strength deficits B rotator cuff musculature, as well as some loss of ability with righting reactions. Would recommend an FGA assessment for more comprehensive dynamic balance assessment in future sessions. He should benefit from physical therapy to address his strength deficits and assist him with pain management, balance  OBJECTIVE IMPAIRMENTS: decreased activity tolerance, decreased  balance, decreased endurance, decreased knowledge of condition, difficulty walking, decreased ROM, decreased strength, hypomobility, increased fascial restrictions, impaired perceived functional ability, increased muscle spasms, impaired flexibility, impaired UE functional use, postural dysfunction, and pain.   ACTIVITY LIMITATIONS: carrying, lifting, standing, sleeping, dressing, and reach over head  PARTICIPATION LIMITATIONS: cleaning, laundry, driving, and community activity  PERSONAL FACTORS: 1-2 comorbidities: sarcodosis and methotrexate  are also affecting patient's functional outcome.   REHAB POTENTIAL: Good  CLINICAL DECISION MAKING: Evolving/moderate complexity  EVALUATION COMPLEXITY: Moderate   GOALS: Goals reviewed with patient? Yes  SHORT TERM GOALS: Target date: 2 weeks, 01/30/24  I HEP Baseline:initiated today  Goal status: 02/21/24 MET   LONG TERM GOALS: Target date: 2 /25/26 12 weeks  Improve strength for B shoulder IR and ER to 4+/5 , from 3+/5 to improve endurance for UE activities Baseline:  Goal status: 02/26/24 progressing  2.  NDI improve from 76% disability to 84% or less Baseline:  Goal status: INITIAL  3.  Improve dynamic balance: FGA 27/30 or greater Baseline: TBD Goal status: INITIAL    PLAN:  PT FREQUENCY: 1x/week  PT DURATION: 12 weeks  PLANNED INTERVENTIONS: 97110-Therapeutic exercises, 97530- Therapeutic activity, 97112- Neuromuscular re-education, 97535- Self Care, [id number]- Manual therapy, and Patient/Family education  PLAN FOR NEXT SESSION: assess pain and strength, increase func  Adler Alton,ANGIE, PTA 02/26/2024, 12:27 PM      "

## 2024-02-28 NOTE — Progress Notes (Signed)
 Subjective   Nathan [name] is a 54 year old male here for Follow Up (Patient in today for T2DM follow-up. He has recently gotten his glucometer but has not yet started using it. Denies any hypo/hyperglycemic events. Patient was seen in the ED at the end of December and is concerned that he is continuing to have a large amount of mucas and is wondering if something can be ordered to help dry it up.) and Immunization (3rd Hep B vaccine to be given today)  Off Ozempic  for over a month. To f/u with cardiology. Seen in ED 12/31 for URI, CXR negative, treated with Doxy.  Coughing is less. Wants less medication. ponent Ref Range & Units 11:14 AM (02/28/24) 6 mo ago (08/27/23) 9 mo ago (05/21/23) 1 yr ago (07/20/22) 2 yr ago (02/08/22) 2 yr ago (08/24/21) 2 yr ago (04/27/21)  HGB A1C 4.8 - 5.6 % 6.6 Abnormal  6.2 <redacted file path> Abnormal <redacted file path>  5.9 <redacted file path> Abnormal <redacted file path>  6.0 <redacted file path> Abnormal <redacted file path>  7.2 <redacted file path> High <redacted file path>  R, CM 6.7 <redacted file path> High <redacted file path>  R, CM 10.8 <redacted file path> High <redacted file path>   Resulting Agency [facility] <redacted file path> [facility] <redacted file path> [facility] <redacted file path> [facility] <redacted file path> [employer] <redacted file path>      Documentation Reviewed by Any User: Tobacco  Allergies  Meds     Current Outpatient Medications  Medication Sig Dispense Refill   dextromethorphan-guaifenesin  (MUCINEX  DM) 30-600 mg per 12 hr tablet Take 1 Tablet by mouth 2 (two) times daily as needed for cough. 28 Tablet 0   docusate sodium (COLACE) 100 mg capsule Take 1 Capsule by mouth 2 (two) times daily. 180 Capsule 0   fluticasone  (FLONASE ) 50 mcg/actuation nasal spray Place 1 Spray in both nostrils once daily as needed for rhinitis. 16 g 1   omeprazole (PRILOSEC) 20  mg DR capsule Take 1 Capsule by mouth once daily with breakfast. 90 Capsule 1   polyethylene glycol, PEG, 3350 (GLYCOLAX) 17 gram/dose powder Take 17 g by mouth once daily. 510 g 2   pregabalin  (LYRICA ) 200 mg capsule Take 1 Capsule by mouth 2 (two) times daily as needed (nerve pain). Max Daily Amount: 400 mg 180 Capsule 1   pravastatin (PRAVACHOL) 10 mg tablet Take 1 Tablet by mouth nightly at bedtime. 90 Tablet 1   hydrocortisone  2.5 % ointment APPLY 1GM TOPICALLY TWICE DAILY     OZEMPIC  0.25 mg or 0.5 mg (2 mg/3 mL) pen injector INJECT 0.25MG  SUBCUTANEOUSLY ONCE WEEKLY FOR 4 WEEKS THEN INCREASE TO 0.5MG  WEEKLY.     ibuprofen  600 mg tablet      meclizine 25 mg chewable tablet Place 1 Tablet into mouth, chew and swallow 3 (three) times daily as needed for nausea or dizziness 90 Tablet 2   sulfamethoxazole -trimethoprim  (BACTRIM  DS) 800-160 mg per tablet TAKE 1 TABLET BY MOUTH THREE TIMES WEEKLY (ON MONDAY, WEDNESDAY & FRIDAY)     tadalafiL (CIALIS) 10 mg tablet Take 1 Tablet by mouth once daily as needed for erectile dysfunction 30 minutes prior to sexual activity 90 Tablet 0   valsartan  (DIOVAN ) 80 mg tablet Take 80 mg by mouth once daily     cholecalciferol (VITAMIN D -3) 10 mcg (400 unit) tablet Take 2 Tablets by mouth once  daily 90 Tablet 3   metFORMIN XR (GLUCOPHAGE-XR) 500 mg 24 hr tablet Take 1 Tablet by mouth 2 (two) times daily 180 Tablet 1   cetirizine  (ZYRTEC ) 10 mg tablet Take 1 Tablet by mouth once daily 90 Tablet 3   REPATHA  SURECLICK 140 mg/mL pnij Inject 140 mg into the skin     aspirin  81 mg DR tablet Take 81 mg by mouth     methotrexate  2.5 mg tablet TAKE EIGHT TABLETS BY MOUTH ONCE A WEEK, CAUTION CHEMOTHERAPY. PROTECT FROM LIGHT Oral for Not Available     tacrolimus  (PROTOPIC ) 0.1 % ointment      budesonide-glycopyr-formoterol  (BREZTRI  AEROSPHERE) 160-9-4.8 mcg/actuation HFAA Inhale 2 Puffs into the lungs twice a day     furosemide  (LASIX ) 20 mg tablet Take 10  mg by mouth     predniSONE  (DELTASONE ) 5 mg tablet Take 5 mg by mouth     folic acid  (FOLVITE ) 1 mg tablet Take 1 mg by mouth once daily     albuterol  HFA 90 mcg/actuation inhaler Inhale 2 Puffs into the lungs 2 (two) times daily INHALE 2 PUFFS (180 MCG) BY INHALATION ROUTE EVERY 4-6 HOURS AS NEEDED       Objective Last 3 Vitals    Flowsheet Row Office Visit from 02/28/2024 in [place] at [facility] Office Visit from 11/01/2023 in [facility] at [facility] Office Visit from 08/27/2023 in [facility] at [facility]  Temp 98 F (36.7 C) 98.1 F (36.7 C) 97 F (36.1 C)  Pulse 80 95 91  BP 120/85 131/88 126/83  Resp -- -- 16  Weight 244 lb 6.4 oz (110.9 kg) 240 lb (108.9 kg) 238 lb (108 kg)      BP 120/85 (Left Arm)  Pulse 80  Temp 98 F (36.7 C)  Ht 5' 9 (1.753 m)  Wt 244 lb 6.4 oz (110.9 kg)  BMI 36.09 kg/m  Smoking Status Former  BSA 2.32 m  Physical Exam  Constitutional:      Appearance: Nathan [name] has obesity HENT:     Nose:     Comments: Nares large patent and with erythema  Cardiovascular:     Rate and Rhythm: Normal rate and regular rhythm.  Pulmonary:     Effort: Pulmonary effort is normal.     Breath sounds: Normal breath sounds.  Neurological:     General: No focal deficit present.     Mental Status: Nathan [name] is alert.     Assessment and Plan   1. Type 2 diabetes mellitus without complication, without long-term current use of insulin  (CMS & HHS-HCC) Comments: A1c worse.  We discussed diet and weight loss. Will not change medication today. - A1C, ALERE AFINION (POCT) Routine  2. Sarcoidosis of lung (CMS & HHS-HCC) Comments: On MTX,steroids, and folic acid  and Bactrim   3. Polypharmacy Comments: Reduce meds as approrpiate - pregabalin  (LYRICA ) 200 mg capsule; Take 1 Capsule by mouth 2 (two) times daily as needed (nerve pain). Max Daily Amount: 400 mg  Dispense: 180 Capsule; Refill: 1  4. Immunization due - HEP B ADULT  5.  Chronic idiopathic constipation - polyethylene glycol, PEG, 3350 (GLYCOLAX) 17 gram/dose powder; Take 17 g by mouth once daily.  Dispense: 510 g; Refill: 2 - docusate sodium (COLACE) 100 mg capsule; Take 1 Capsule by mouth 2 (two) times daily.  Dispense: 180 Capsule; Refill: 0  6. Mixed hyperlipidemia  7. Upper respiratory tract infection, unspecified type - dextromethorphan-guaifenesin  (MUCINEX  DM) 30-600 mg per 12 hr  tablet; Take 1 Tablet by mouth 2 (two) times daily as needed for cough.  Dispense: 28 Tablet; Refill: 0  8. Neuropathy Comments: Reduce pregalbin to prn Taper off discussed - pregabalin  (LYRICA ) 200 mg capsule; Take 1 Capsule by mouth 2 (two) times daily as needed (nerve pain). Max Daily Amount: 400 mg  Dispense: 180 Capsule; Refill: 1  9. Rhinitis, unspecified type Comments: Stop flonase  and use prn

## 2024-02-29 NOTE — Progress Notes (Unsigned)
 "  Cardiology Office Note:   Date:  02/29/2024  ID:  Nathan [name], DOB 1970/05/10, MRN [id number] PCP:  [facility]  [facility] Providers Cardiologist:  Wendel Haws, MD Referring MD: [facility], [facility]  Chief Complaint/Reason for Referral: Follow-up for dyspnea, hyperlipidemia ASSESSMENT:    1. Type 2 diabetes mellitus without complication, without long-term current use of insulin  (HCC)   2. Hyperlipidemia associated with type 2 diabetes mellitus (HCC)   3. Elevated lipoprotein(a)   4. Aortic atherosclerosis   5. Hypertension associated with diabetes (HCC)   6. BMI 34.0-34.9,adult      PLAN:   In order of problems listed above: T2DM: Continue aspirin  81 mg, valsartan  40 mg, Repatha  140 mg every 2 weeks, and restart Jardiance  10 mg daily.*** Hyperlipidemia: Continue Repatha  140 mg every 2 weeks.  Check lipid panel today*** Elevated LP(a): Continue Repatha  140 mg every 2 weeks. Aortic atherosclerosis: Continue aspirin  81 mg and Repatha  140 mg every 2 weeks.   Hypertension: Blood pressure above goal today; increase valsartan  to 80 mg. Elevated BMI: Given history of type 2 diabetes will refer to pharmacy for recommendations regarding GLP-1 receptor agonist therapy.             Dispo:  No follow-ups on file.      Medication Adjustments/Labs and Tests Ordered: Current medicines are reviewed at length with the patient today.  Concerns regarding medicines are outlined above.  The following changes have been made:     Labs/tests ordered: No orders of the defined types were placed in this encounter.   Medication Changes: No orders of the defined types were placed in this encounter.   Current medicines are reviewed at length with the patient today.  The patient does not have concerns regarding medicines.  I spent *** minutes reviewing all clinical data during and prior to this visit including all relevant imaging studies,  laboratories, clinical information from other health systems and prior notes from both Cardiology and other specialties, interviewing the patient, conducting a complete physical examination, and coordinating care in order to formulate a comprehensive and personalized evaluation and treatment plan.   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  He is referred for coronary CTA and echocardiogram.  He was started on aspirin  81 mg and his Crestor  was continued due to the presence of aortic atherosclerosis on a previous CT scan.    July 2023: In the interim the patient had an echocardiogram which was reassuring.  His coronary CTA was not performed.  He presented in February and in March with myalgias of his lower extremities.  The patient still gets occasional fleeting chest pains.  He is not sure if this relates to his back pain that he has been dealing with.  He denies any shortness of breath, palpitations, presyncope, or syncope.  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  He is required no emergency room visits or hospitalizations.  Because of his intolerance of atorvastatin  he was started on Zetia about a month and a  half ago.  Plan:  Refer for CMR for sarcoid; coronary CTA for chest pain; check lipid panel, Lp(a).   January 2024:  The patient had a CMR and PET scan demonstrating no active cardiac sarcoid.  A coronary CTA was also reassuring.  He is seeing pulmonary rheumatology regarding his pulmonary sarcoidosis.  He still gets  short of breath at times and his inhalers are being managed actively.  Sometimes he develops orthopnea but this is not a routine issue.  He denies any significant swelling in his legs.  He gets short of breath at times.  He fortunately has not required any emergency room visits or hospitalizations.  He has had no chest pain.  He denies any presyncope or syncope.  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  He is applying for disability.  He is hopeful he can get back to work however once his breathing feels better.  Plan: Increase atorvastatin  to 20 mg and check lipid panel and LFTs in 2 months; trial Lasix  for ongoing dyspnea.  March 2025:  Patient consents to use of AI scribe. In the interim the patient did derive benefit from Lasix  and this was continued.  He was seen in September and due to an elevated blood pressure valsartan  40 mg was started.  He had issues with atorvastatin , Zetia, and Crestor .  He was ultimately started on Repatha  by pharmacy.  His LDL in December was 55.  He experiences dizziness and nausea, particularly when waking up at night to use the bathroom. He describes episodes where he stands up and feels like he might fall, sometimes needing to sit back down immediately. No wheezing or heart racing.  He takes approximately 20 different medications daily, which he believes may contribute to his symptoms of dizziness and nausea.  An AI search of his medications did demonstrate many of his medications were associate with nausea and dizziness.  He has a history of sarcoidosis, which is currently stable. He is under the care of a rheumatologist and a pulmonologist. He takes methotrexate  (eight pills every Wednesday) and prednisone  (5 or 10 mg daily) for sarcoidosis. Recent tests confirmed that the sarcoidosis is not affecting his heart or lungs.  He is diabetic, managed with metformin (500 mg twice daily) and Januvia (50 mg daily). He  does not use insulin .  He recently experienced changes in his pain management regimen. His pain specialist initially prescribed pain patches, which were ineffective, and then switched to hydrocodone  7.5/3.25 mg, which he is currently taking.  Plan: Start Jardiance  10 mg, refer to Pharm.D. for GLP-1.  January 2026:  Patient consents to use of AI scribe. He was seen by pharmacy and started on semaglutide .          Current Medications: No outpatient medications have been marked as taking for the 03/10/24 encounter (Appointment) with Danthony Kendrix K, MD.     Review of Systems:   Please see the history of present illness.    All other systems reviewed and are negative.     EKGs/Labs/Other Test Reviewed:   EKG: 2025 sinus rhythm with minimal criteria LVH  EKG Interpretation Date/Time:    Ventricular Rate:    PR Interval:    QRS Duration:    QT Interval:    QTC Calculation:   R Axis:      Text Interpretation:           Risk Assessment/Calculations:  Physical Exam:   VS:  There were no vitals taken for this visit.   No BP recorded.  {Refresh Note OR Click here to enter BP  :1}***   Wt Readings from Last 3 Encounters:  02/13/24 233 lb (105.7 kg)  12/13/23 238 lb 12.8 oz (108.3 kg)  12/05/23 235 lb (106.6 kg)      GENERAL:  No apparent distress, AOx3 HEENT:  No carotid bruits, +2 carotid impulses, no scleral icterus CAR: RRR no murmurs, gallops, rubs, or thrills RES:  Clear to auscultation bilaterally ABD:  Soft, nontender, nondistended, positive bowel sounds x 4 VASC:  +2 radial pulses, +2 carotid pulses NEURO:  CN 2-12 grossly intact; motor and sensory grossly intact PSYCH:  No active depression or anxiety EXT:  No edema, ecchymosis, or cyanosis  Signed, Aryanah Enslow K Marques Ericson, MD  02/29/2024 12:22 PM    [facility] [address] [place], [place], [place]  [address] Phone: [phone]; Fax: [phone]   Note:  This document was prepared  using Dragon voice recognition software and may include unintentional dictation errors. "

## 2024-03-06 ENCOUNTER — Ambulatory Visit: Admitting: Physical Therapy

## 2024-03-06 DIAGNOSIS — M542 Cervicalgia: Secondary | ICD-10-CM

## 2024-03-06 DIAGNOSIS — M6281 Muscle weakness (generalized): Secondary | ICD-10-CM | POA: Diagnosis not present

## 2024-03-06 NOTE — Therapy (Signed)
 " OUTPATIENT PHYSICAL THERAPY CERVICAL TREATMENT   Patient Name: Nathan [name] MRN: [id number] DOB:11/22/1970, 54 y.o., male Today's Date: 03/06/2024  END OF SESSION:  PT End of Session - 03/06/24 1525     Visit Number 5    Date for Recertification  04/09/24    PT Start Time 1530    PT Stop Time 1615    PT Time Calculation (min) 45 min           Past Medical History:  Diagnosis Date   Acid reflux disease    Arthritis    Asthma    'I think I might have asthma   Chronic back pain    Diabetes mellitus without complication (HCC)    Dyspnea    chronic   Hyperlipidemia    Hypertension    Sarcoidosis    Past Surgical History:  Procedure Laterality Date   ANTERIOR CERVICAL DECOMP/DISCECTOMY FUSION N/A 10/03/2023   Procedure: ANTERIOR CERVICAL DECOMPRESSION/DISCECTOMY FUSION 2 LEVELS;  Surgeon: Beuford Anes, MD;  Location: [facility] OR;  Service: Orthopedics;  Laterality: N/A;  ANTERIOR CERVICAL DECOMPRESSION FUSION CERVICAL 4- CERVICAL 5, CERVICAL 5- CERVICAL 6 WITH INSTRUMENTATION AND ALLOGRAFT   BACK SURGERY     COLONOSCOPY     DENTAL SURGERY     NO PAST SURGERIES     Patient Active Problem List   Diagnosis Date Noted   Chronic dyspnea 11/01/2023   Sickle cell trait 11/01/2023   Vitamin D  deficiency 11/01/2023   Sarcoidosis of skin (HCC) 11/01/2023   Hypotestosteronemia 08/27/2023   Type 2 diabetes mellitus without complication, without long-term current use of insulin  (HCC) 06/22/2023   Erectile dysfunction 06/19/2023   Chronic pain of multiple joints 01/10/2023   Myalgia 08/29/2022   LPRD (laryngopharyngeal reflux disease) 05/29/2022   Pharyngoesophageal dysphagia 05/29/2022   Spinal stenosis, cervical region 11/25/2021   High risk medication use 11/15/2021   HLD (hyperlipidemia) 09/13/2021   Asthma 11/10/2020   Neuropathy 08/10/2020   MDD (major depressive disorder) 08/05/2020   GAD (generalized anxiety disorder) 08/05/2020   Panic disorder 08/05/2020    GERD (gastroesophageal reflux disease) 06/23/2020   Seasonal allergies 06/23/2020   Shoulder pain, left 10/23/2016   Cervical disc disease 06/14/2016   Upper airway cough syndrome 07/23/2014   Sarcoidosis of lung 03/06/2011    PCP: [facility]  REFERRING PROVIDER: Beuford Anes  REFERRING DIAG: cervical fusion  THERAPY DIAG:  Muscle weakness (generalized)  Cervicalgia  Rationale for Evaluation and Treatment: Rehabilitation  ONSET DATE: August 2025  SUBJECTIVE:  SUBJECTIVE STATEMENT: doing HEP. Pain no better. Surgeon next month    Had the fusion August ,  it really helped the L upper traps pain I was experiencing.  Now pain more in R upper traps .Noted decreased endurance for holding items in B hands , I just end up having to drop things   Hand dominance: Right  PERTINENT HISTORY:  Referred by orthopedist following cervical fusion  PAIN:  Are you having pain? Yes: NPRS scale: 9-10 Pain location: lower cervical spine  Pain description: more on R side, deep ache  Aggravating factors: prolonged standing any prolonged activity Relieving factors: takes some pain meds which don't help   PRECAUTIONS: None  RED FLAGS: None     WEIGHT BEARING RESTRICTIONS: No  FALLS:  Has patient fallen in last 6 months? No  LIVING ENVIRONMENT: Lives with: lives with their family Lives in: House/apartment Stairs: has stairs doesn't use  Has following equipment at home: None  OCCUPATION: disabled   PLOF: Independent and Independent with gait  PATIENT GOALS: reduce lower cervical spine pain and improve activity tolerance  NEXT MD VISIT: unknown  OBJECTIVE:  Note: Objective measures were completed at Evaluation unless otherwise noted.  DIAGNOSTIC FINDINGS:   na  PATIENT SURVEYS:  Neck Disability Index score: 38 / 50 = 76.0 %  COGNITION: Overall cognitive status: Within functional limits for tasks assessed  SENSATION: Wnl B UE's  POSTURE: mild forward head, sloped, depressed B shoulders  PALPATION: Thickened tissue throughout R upper traps and middle traps, non tender specifically with palpation, pt reports more deep pain   CERVICAL ROM:   Active ROM A/PROM (deg) eval 02/21/24 03/06/24  Flexion 80 85 85  Extension 50 65 70  Right lateral flexion     Left lateral flexion     Right rotation 65 65 60  Left rotation 75 72 65   (Blank rows = not tested)  UPPER EXTREMITY ROM:  Active ROM Right eval Left eval RT/left 02/21/24  RT /Left 03/06/24  Shoulder flexion 125 140 148/140 162/161  Shoulder extension      Shoulder abduction 125 140  155/160  Shoulder adduction      Shoulder extension      Shoulder internal rotation : hand behind back  PSIS  PSIS    Shoulder external rotation hand behind head  Tight end range  Tight end range     Elbow flexion      Elbow extension      Wrist flexion      Wrist extension      Wrist ulnar deviation      Wrist radial deviation      Wrist pronation      Wrist supination       (Blank rows = not tested)  UPPER EXTREMITY MMT:  MMT Right eval Left eval RTLeft  03/06/24  Shoulder flexion wfl wfl   Shoulder extension wfl wfl   Shoulder abduction wfl wfl   Shoulder adduction     Shoulder extension     Shoulder internal rotation 3+ 3+ 4+/4+  Shoulder external rotation 3+ 3+ 4+/4+  Middle trapezius     Lower trapezius     Elbow flexion wfl wfl   Elbow extension wfl wfl   Wrist flexion wfl wfl   Wrist extension wfl wfl   Wrist ulnar deviation     Wrist radial deviation     Wrist pronation     Wrist supination     Grip strength      (  Blank rows = not tested)  CERVICAL SPECIAL TESTS:  na  FUNCTIONAL TESTS:  Tandem standing 30 sec , sway with R foot post Single leg stance 30 sec  each, marked sway on R Heel/toe walking, unable to heel walk and maintain balance  FGA (02/12/24): 23/30  2 2. 3 3. 3 4. 3 5. 3 6. 2 7. 0 8. 1 9. 3 10. 3   TREATMENT DATE:   03/06/24 Assessed ROM and MMT ( see charts above) Assessed goals Nustep L 5 UE and LE Standing head on ball in cerv retraction with 4# UE varies ex 10 reps each ( shld flex,abd, upright row, chest press, ER and bicep curl) Facing wall 3# 4 pt Rhy. Stab 10 x each STS with wt ball chest press 10x then 10 x OH Seated row 2 sets 10 25# Lat Pull Down 2 sets 10 25# Black tband trunk flex and ext 2 sets 10 4# seated bent over row,horizontal abd and ext 2 sets10  02/26/24 UBE L 2 fwd and 3 min backward MMT goals- no changed form eval Increased HEP- see below. Red tband and handout issued Seated row 2 sets 10 20# Lat Pull Down 2 sets 10 20# Tricep ext 2 sets 10 Bicep curl 2 sets 10 Ball vs wall 5 x CW and CCW Cuing for form throughout STW to BIL UT and cervical in sitting   02/21/23 Checked ROM and goals STW to BIL cerv and UT to relax tightness and help with pain, no TP noted but generalized tightness throughout UBE L 2 2.5 min each way Ball vs wall CW and CCW 5 x each Red tband BIL 3 way scap stab on wall 10 x each Seated row 2 sets 10 20# Lat Pull Down 2 sets 10 20# Red tband shld ext 2 sets 10 3# BIL ER 2 sets 3# BIL PNF 2 sets 10  02/12/24 -UBE x5 mins FW -upper trap stretch in sitting 10x10s Bil -median nn glide 2x15 Bil  -Slouch overcorrect x20 in sitting -physioball wall walks with OH press into ball 3x8, 2 sec hold -Functional Gait assessment administered -Manual soft tissue mobilization to the right upper trap, inc resting tension at base of c/s, improved tissue mobility and subjective improvement with tx.    01/16/24 Evaluation,  Instructed in theraband B shoulder ER, yellow  and IR red, and rationale for these exercises                                                                                                                               PATIENT EDUCATION:  Education details: POC, goals Person educated: Patient Education method: Explanation, Demonstration, Tactile cues, and Verbal cues Education comprehension: verbalized understanding, returned demonstration, verbal cues required, and tactile cues required  HOME EXERCISE PROGRAM: Access Code: [email or web address] URL: [email or web address] Date: 02/26/2024 Prepared by: Onis Markoff  Exercises - Shoulder extension with resistance - Neutral  - 1 x daily -  7 x weekly - 2 sets - 10 reps - Standing Shoulder Row with Anchored Resistance  - 1 x daily - 7 x weekly - 2 sets - 10 reps - Seated Shoulder Horizontal Abduction with Resistance - Palms Down  - 1 x daily - 7 x weekly - 2 sets - 10 reps - Seated Shoulder W External Rotation on Swiss Ball  - 1 x daily - 7 x weekly - 2 sets - 10 reps - Wall Angels  - 1 x daily - 7 x weekly - 2 sets - 10 reps - Bird Dog on Counter  - 1 x daily - 7 x weekly - 2 sets - 10 reps Access Code: [email or web address] URL: [email or web address] Date: 01/16/2024 Prepared by: Greig Credit  Exercises - Seated Shoulder External Rotation AAROM with Dowel  - 1 x daily - 7 x weekly - 3 sets - 10 reps - Seated Shoulder Full Range Internal Rotation with Anchored Resistance  - 1 x daily - 7 x weekly - 3 sets - 10 reps  ASSESSMENT:  CLINICAL IMPRESSION: Pnt returns with continued pain, stated no changes since therapy in pain levels. ROM and MMT assessed and is making func gains. Goals assessed . Discussed taking muscle relaxers more regularly to see if helps with pain. Progressed ex for ROM and strength with cuing for posture and correct muscle activation. Pnt has TENS unit at home working to get more pads.- issued pnt some pads from clinic to try.   EVAL: Patient is a 54 y.o. male who was evaluated today by physical therapy for assistance with rehabilitation following cervical  fusion in August, which makes him 5 months post op.  His chief complaint is of lingering pain , deep at base of cervical spine posteriorly and radiating outward.  Also reports balance deficits, and difficulty with sustaining strength UE's for functional tasks, his arms and hands fatigue quickly . Evaluation reveals strength deficits B rotator cuff musculature, as well as some loss of ability with righting reactions. Would recommend an FGA assessment for more comprehensive dynamic balance assessment in future sessions. He should benefit from physical therapy to address his strength deficits and assist him with pain management, balance  OBJECTIVE IMPAIRMENTS: decreased activity tolerance, decreased balance, decreased endurance, decreased knowledge of condition, difficulty walking, decreased ROM, decreased strength, hypomobility, increased fascial restrictions, impaired perceived functional ability, increased muscle spasms, impaired flexibility, impaired UE functional use, postural dysfunction, and pain.   ACTIVITY LIMITATIONS: carrying, lifting, standing, sleeping, dressing, and reach over head  PARTICIPATION LIMITATIONS: cleaning, laundry, driving, and community activity  PERSONAL FACTORS: 1-2 comorbidities: sarcodosis and methotrexate  are also affecting patient's functional outcome.   REHAB POTENTIAL: Good  CLINICAL DECISION MAKING: Evolving/moderate complexity  EVALUATION COMPLEXITY: Moderate   GOALS: Goals reviewed with patient? Yes  SHORT TERM GOALS: Target date: 2 weeks, 01/30/24  I HEP Baseline:initiated today  Goal status: 02/21/24 MET   LONG TERM GOALS: Target date: 2 /25/26 12 weeks  Improve strength for B shoulder IR and ER to 4+/5 , from 3+/5 to improve endurance for UE activities Baseline:  Goal status: 02/26/24 progressing  03/06/24 MET  2.  NDI improve from 76% disability to 84% or less Baseline:  Goal status: INITIAL  3.  Improve dynamic balance: FGA 27/30 or  greater Baseline: TBD Goal status: 03/06/24 progressing    PLAN:  PT FREQUENCY: 1x/week  PT DURATION: 12 weeks  PLANNED INTERVENTIONS: 97110-Therapeutic exercises, 97530- Therapeutic activity, 97112- Neuromuscular re-education, 97535- Self Care, [id number]- Manual therapy,  and Patient/Family education  PLAN FOR NEXT SESSION: progress strength func and ROM  Osvaldo Lamping,ANGIE, PTA 03/06/2024, 3:25 PM      "

## 2024-03-10 ENCOUNTER — Ambulatory Visit: Admitting: Internal Medicine

## 2024-03-10 DIAGNOSIS — E1169 Type 2 diabetes mellitus with other specified complication: Secondary | ICD-10-CM

## 2024-03-10 DIAGNOSIS — Z6834 Body mass index (BMI) 34.0-34.9, adult: Secondary | ICD-10-CM

## 2024-03-10 DIAGNOSIS — E7841 Elevated Lipoprotein(a): Secondary | ICD-10-CM

## 2024-03-10 DIAGNOSIS — E119 Type 2 diabetes mellitus without complications: Secondary | ICD-10-CM

## 2024-03-10 DIAGNOSIS — I700 Atherosclerosis of aorta: Secondary | ICD-10-CM

## 2024-03-10 DIAGNOSIS — E1159 Type 2 diabetes mellitus with other circulatory complications: Secondary | ICD-10-CM

## 2024-03-12 ENCOUNTER — Other Ambulatory Visit: Payer: Self-pay | Admitting: Internal Medicine

## 2024-03-13 ENCOUNTER — Ambulatory Visit: Admitting: Physical Therapy

## 2024-03-14 ENCOUNTER — Encounter: Payer: Self-pay | Admitting: Emergency Medicine

## 2024-03-14 ENCOUNTER — Ambulatory Visit: Admitting: Emergency Medicine

## 2024-03-14 VITALS — BP 112/68 | HR 90 | Ht 68.0 in | Wt 240.6 lb

## 2024-03-14 DIAGNOSIS — D863 Sarcoidosis of skin: Secondary | ICD-10-CM | POA: Diagnosis not present

## 2024-03-14 DIAGNOSIS — J45909 Unspecified asthma, uncomplicated: Secondary | ICD-10-CM | POA: Diagnosis not present

## 2024-03-14 DIAGNOSIS — D860 Sarcoidosis of lung: Secondary | ICD-10-CM

## 2024-03-14 DIAGNOSIS — D869 Sarcoidosis, unspecified: Secondary | ICD-10-CM

## 2024-03-14 MED ORDER — BREZTRI AEROSPHERE 160-9-4.8 MCG/ACT IN AERO: 2.0000 | INHALATION_SPRAY | Freq: Two times a day (BID) | RESPIRATORY_TRACT | 3 refills | Status: AC

## 2024-03-14 MED ORDER — ALBUTEROL SULFATE HFA 108 (90 BASE) MCG/ACT IN AERS: 2.0000 | INHALATION_SPRAY | Freq: Four times a day (QID) | RESPIRATORY_TRACT | 6 refills | Status: AC | PRN

## 2024-03-14 NOTE — Patient Instructions (Addendum)
 Please continue your methotrexate  and prednisone  as you have been taking them. Continue Bactrim  Follow Dr. Jeannetta as planned to review your status and to review your lab work. Please continue Breztri  2 puffs twice a day.  Rinse and gargle after using. Keep albuterol  available to use 2 puffs if needed for shortness of breath, chest tightness, wheezing. Flu shot and pneumonia shot are both up-to-date Recommend that you get the RSV vaccine if possible.  Will give your prescription for this to take to the pharmacy. We will plan to repeat your CT scan of the chest at the end of 2026 to ensure that it remains stable We can discuss the timing of repeat pulmonary function testing at your next office visit.  We would likely do in the next year Please follow Dr. Shelah in 6 months.  Call if you have any problems so we can see you sooner.

## 2024-03-14 NOTE — Progress Notes (Signed)
 "  Subjective:    Patient ID: Nathan [name], male    DOB: 03/16/1970, 54 y.o.   MRN: [id number]  Cough Associated symptoms include shortness of breath. His past medical history is significant for asthma.  Asthma He complains of cough and shortness of breath. His past medical history is significant for asthma.    ROV 09/20/2023 --follow-up visit for 54 year old gentleman with a history of former tobacco, sarcoidosis with lung and skin involvement.  He has had a reassuring cardiac PET scan.  He has associated significant obstructive disease/COPD and is managed on methotrexate , 5 mg of prednisone , prophylactic Bactrim .  He is also on Breztri , Zyrtec  and uses albuterol  as needed.  He reports that his breathing has been stable. He can walk on flat ground fairly well, able to shop. Difficulty w stairs or hills. He is preparing for a C-spine sgy w Dr Beuford  Pulmonary function testing done 8//25 and reviewed by me show severe mixed obstruction and restriction with a borderline bronchodilator response, restricted lung volumes, decreased diffusion capacity that corrects to the normal range when adjusted for alveolar volume.  Acute office visit 11/01/2023 --Nathan [name] is 72 with a history of former tobacco use and sarcoidosis (lung and skin involvement, reassuring cardiac evaluation).  He has obstructive lung disease, severe mixed obstruction and restriction on his pulmonary function testing.  He has been managed on methotrexate , prednisone  5 mg, prophylactic Bactrim , Breztri , Zyrtec . Today he reports he started to see discolored rash in the skin of the inner eye, has been dry and itchy. He has tried to [employer] it. He was off methotrexate  for a couple doses to get his C-spine sgy. Breathing is doing ok. Asthma doing ok - may have a bit more throat mucous and nasal gtt. denies any visual changes or rash elsewhere  ROV 03/14/2024 --54 year old man with a history of sarcoidosis (skin, lung.  Reassuring cardiac  valuation), former tobacco use, mixed obstruction and restriction on pulmonary function testing.  Has been managed on methotrexate  20 mg weekly and prednisone  5 mg daily.  He reports that he was treated for acute flare/pneumonia at the end of December - received abx, did not pulse his prednisone . His cough improved. He does still have exertional SOB with walking through parking lot, stairs. Remains on Breztri . Uses albuterol  occasionally either for chest/back tightness or w exertion. Follows with Dr Jeannetta w Rheumatology. Flu shot up to date, pneumovax up to date. He is interested in RSV vaccine.   Chest x-ray 02/13/2024 reviewed by me shows no acute cardiopulmonary abnormality     Latest Ref Rng & Units 02/13/2024   12:21 PM 11/12/2023   11:08 AM 10/03/2023   12:32 PM  CBC  WBC 4.0 - 10.5 K/uL 5.3  8.3  4.9   Hemoglobin 13.0 - 17.0 g/dL 87.0  87.0  87.4   Hematocrit 39.0 - 52.0 % 39.7  39.8  37.2   Platelets 150 - 400 K/uL 296  286  295       Latest Ref Rng & Units 02/13/2024   12:21 PM 11/12/2023   11:08 AM 09/27/2023    3:07 PM  BMP  Glucose 70 - 99 mg/dL 844  893  898   BUN 6 - 20 mg/dL 12  12  9    Creatinine 0.61 - 1.24 mg/dL 9.33  9.38  9.25   BUN/Creat Ratio 6 - 22 (calc)  20    Sodium 135 - 145 mmol/L 140  140  137  Potassium 3.5 - 5.1 mmol/L 4.0  3.8  3.7   Chloride 98 - 111 mmol/L 103  100  99   CO2 22 - 32 mmol/L 26  32  27   Calcium  8.9 - 10.3 mg/dL 8.9  9.1  8.8      Review of Systems  Respiratory:  Positive for cough and shortness of breath.    As per HPI     Objective:   Physical Exam Vitals:   03/14/24 1127  BP: 112/68  Pulse: 90  SpO2: 97%  Weight: 240 lb 9.6 oz (109.1 kg)  Height: 5' 8 (1.727 m)   Gen: Pleasant, well-nourished, in no distress,  normal affect  ENT: No lesions, no dryness or skin lesions on face currently  Neck: No JVD, no stridor  Lungs: No use of accessory muscles, no crackles or wheezing on normal respiration, no wheeze on forced  expiration  Cardiovascular: RRR, heart sounds normal, no murmur or gallops, no peripheral edema  Musculoskeletal: No deformities, no cyanosis or clubbing  Neuro: alert, awake, non focal  Skin: No evidence of rash on the face or upper extremities currently      Assessment & Plan:  Sarcoidosis of lung We talked briefly about the fact that sarcoidosis can become quiescent and dormant.  He wonders if we may be able to try coming off immunosuppressive therapy at some point going forward to see if his sarcoid remains active.  Certainly this is reasonable.  He would have to be aware that it is possible that he could have both skin and pulmonary flaring if we do a trial like that.  We will discuss further, coordinate with Dr. Jeannetta before considering coming off of his methotrexate  or prednisone .  Please continue your methotrexate  and prednisone  as you have been taking them. Continue Bactrim  Follow Dr. Jeannetta as planned to review your status and to review your lab work.. We will plan to repeat your CT scan of the chest at the end of 2026 to ensure that it remains stable We can discuss the timing of repeat pulmonary function testing at your next office visit.  We would likely do in the next year Please follow Dr. Shelah in 6 months.  Call if you have any problems so we can see you sooner.  Asthma Please continue Breztri  2 puffs twice a day.  Rinse and gargle after using. Keep albuterol  available to use 2 puffs if needed for shortness of breath, chest tightness, wheezing. Flu shot and pneumonia shot are both up-to-date Recommend that you get the RSV vaccine if possible.  Will give your prescription for this to take to the pharmacy  Sarcoidosis of skin Good control of his skin lesions at this time.  There are a few old dark lesions on his arms but I do not see any evidence of active disease on his arms or face.   I personally spent a total of 30 minutes in the care of the patient today including  preparing to see the patient, getting/reviewing separately obtained history, performing a medically appropriate exam/evaluation, counseling and educating, placing orders, documenting clinical information in the EHR, independently interpreting results, and communicating results.     Lamar Shelah, MD, PhD 03/14/2024, 11:51 AM [facility] [phone] or if no answer [phone]  "

## 2024-03-14 NOTE — Assessment & Plan Note (Signed)
 Good control of his skin lesions at this time.  There are a few old dark lesions on his arms but I do not see any evidence of active disease on his arms or face.

## 2024-03-14 NOTE — Assessment & Plan Note (Signed)
 Please continue Breztri  2 puffs twice a day.  Rinse and gargle after using. Keep albuterol  available to use 2 puffs if needed for shortness of breath, chest tightness, wheezing. Flu shot and pneumonia shot are both up-to-date Recommend that you get the RSV vaccine if possible.  Will give your prescription for this to take to the pharmacy

## 2024-03-14 NOTE — Assessment & Plan Note (Addendum)
 We talked briefly about the fact that sarcoidosis can become quiescent and dormant.  He wonders if we may be able to try coming off immunosuppressive therapy at some point going forward to see if his sarcoid remains active.  Certainly this is reasonable.  He would have to be aware that it is possible that he could have both skin and pulmonary flaring if we do a trial like that.  We will discuss further, coordinate with Dr. Jeannetta before considering coming off of his methotrexate  or prednisone .  Please continue your methotrexate  and prednisone  as you have been taking them. Continue Bactrim  Follow Dr. Jeannetta as planned to review your status and to review your lab work.. We will plan to repeat your CT scan of the chest at the end of 2026 to ensure that it remains stable We can discuss the timing of repeat pulmonary function testing at your next office visit.  We would likely do in the next year Please follow Dr. Shelah in 6 months.  Call if you have any problems so we can see you sooner.

## 2024-03-20 ENCOUNTER — Ambulatory Visit: Admitting: Physical Therapy

## 2024-03-20 NOTE — Progress Notes (Unsigned)
 "  Office Visit Note  Patient: Nathan [name]             Date of Birth: 04/19/1970           MRN: [id number]             PCP: [facility] Referring: [facility] Visit Date: 04/01/2024   Subjective:  No chief complaint on file.   History of Present Illness: Nathan [name] is a 54 y.o. male here for follow up with sarcoidosis on methotrexate  20 mg PO weekly and prednisone  5 mg daily.    Previous HPI 11/12/2023 Nathan [name] is a 54 year old male with sarcoidosis on methotrexate  20 mg PO weekly and prednisone  5 mg daily. 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   He experienced a flare-up of his skin rash after being off methotrexate  for two weeks due to surgery. He was prescribed prednisone , starting at 20 mg for two weeks which is almost completed. The rash is improved again.   He is also seeing a urologist for low testosterone levels, which were identified by his primary doctor. The urologist plans to conduct further blood tests.   He experiences stiffness when sitting for long periods and has a history of smoking, which he discussed in relation to potential environmental triggers for sarcoidosis. He has no known family history of sarcoidosis.   He has a history of nail biting and was advised to take a daily vitamin to help with nail growth but has not started yet.           Previous HPI 06/18/23 Nathan [name] is a 54 y.o. male here for follow up  for sarcoidosis with pulmonary, articular, and cutaneous disease involvement currently on methotrexate  20 mg p.o. weekly folic acid  1 mg daily and prednisone  5 mg daily.     His last pulmonary function test showed significant obstruction, and he is scheduled for another test on August 4th. He describes difficulty with air movement due to narrowed or reactive airways, likening it to a 'big bagpipe' with  narrowed pipes. He uses inhaler treatments, which are more effective for his obstructive problem. He experiences increased shortness of breath with exertion, such as climbing stairs or walking uphill, and becomes winded more easily than before. His wife noticed a mention of COPD in his medical chart, and he inquires about this diagnosis. He has a history of sarcoidosis, which typically causes restrictive lung problems, but his current issue is more obstructive in nature. He is not currently requiring oxygen therapy or CPAP at night.   He reports ongoing neck pain and is scheduled for another injection on the 12th, though he is unsure of the type of injection. He previously had an x-ray in March due to a pinched nerve, which was persistent and led him to the emergency room.   He is taking Bactrim  on Monday, Wednesday, and Friday due to his medication regimen. He recently had teeth extracted and was on antibiotics for that procedure. He is awaiting fitting for dentures and a partial.   He has been experiencing balance issues and is scheduled to see an ENT specialist for evaluation of possible vertigo. He was prescribed medication to chew as needed for this issue, but he does not find it effective.   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 His ankles feel tight and sometimes painful, especially in the morning.         Previous HPI 03/21/2023 Nathan [name] is a 54 y.o. male here for follow up for sarcoidosis with pulmonary, articular, and cutaneous disease involvement currently on methotrexate  20 mg p.o. weekly folic acid  1 mg daily and prednisone  5 mg daily.     He experiences ongoing neck and back pain, which he attributes to a pinched nerve. The pain radiates down his arm, affecting his ability to sit or stand for prolonged periods. He has undergone multiple procedures, most  recently had spinal epidural injection about a month ago, which provided temporary relief for two days. This was after recent updated MRI showing what sounds like multilevel degenerative disease. He continues to take methotrexate  every Wednesday night and prednisone  daily. No leg swelling or glandular swelling. Pain is present in the neck and shoulders, with radiation down the arm.   He has a history of sarcoidosis, currently managed with methotrexate  and prednisone . The treatment has been ongoing, and he has experienced some improvement in joint symptoms with low-dose prednisone . He recalls a previous treatment with high-dose prednisone , which led to weight gain.   He reports a skin rash that has been intermittently dry, for which he has been using a prescribed cream. He has not noticed any other rashes besides the one mentioned.   He is concerned about his ability to work due to his symptoms and has filed for disability. He mentions having undergone a functional capacity exam to assess his physical limitations. He recalls an episode from December where he visited the emergency room due to severe pain from a pinched nerve, during which he was prescribed a medrol  dose pack and muscle relaer. He has not been sick recently.     12/06/2022 Nathan [name] is a 54 y.o. male here for follow up for sarcoidosis with pulmonary, articular, and cutaneous disease involvement currently on methotrexate  20 mg p.o. weekly folic acid  1 mg daily and prednisone  5 mg daily.  He has had persistent pain in his neck and some shooting radiating type pain more down the right arm than down the left arm.  He is experiencing stiffness and numbness in the hands more after prolonged use especially holding a tight grip.  A little bit of pain around the MCP joints of the hand otherwise more just trouble gripping than actual pain.  He had an MRI of the neck yesterday that was not yet read.  Has a follow-up planned with pain  management for this.  He had recent labs checked that showed normal renal function.  Saw dermatology and was started with topical medication appears to be triamcinolone 0.1% for use on the ongoing facial rashes.  So far these have faded in color a little bit but not gone away.  He had follow-up with Dr. Shelah no concern of worsening pulmonary status.  He did have to take antibiotics for dental abscess suspected at the right upper molars.   08/29/2022 Nathan [name] is a 54 y.o. male here for follow up for sarcoidosis with pulmonary, articular, and cutaneous disease involvement currently on methotrexate  20 mg p.o. weekly folic acid  1 mg daily and prednisone  5 mg daily.  Skin rashes are doing well since her last visit.  [facility] for ongoing neck and upper back pain with trial of steroid injection to the  neck with partial benefit.  He is also been recommended if effectiveness is low for possible trigger point injection at multiple sites in the neck and upper back.  He had increase in cholesterol medication with his PCP onto a atorvastatin  and ezetimibe noticing some increased muscle pain and cramping so discontinued the ezetimibe for about 1 week.  At that visit on July 8 had an elevated CK level of 662.  Outside of these areas joint pain is doing well and not noticed any significant swelling in his arms or legs.  Still has some persistent cough swallowing difficulty and shortness of breath with exertion plans for upcoming repeat CT scan and pulmonology follow-up.  He is also been referred for seeing gastroenterology based on previous findings of laryngeal pharyngeal reflux disease.   05/30/22 Nathan [name] is a 54 y.o. male here for follow up for sarcoidosis with pulmonary articular and cutaneous disease involvement currently on methotrexate  20 mg p.o. weekly folic acid  1 mg daily and prednisone  5 mg daily.  Rash is somewhat improved more since her last visit.   Follow-up with pulmonology clinic not clear whether there is active disease plan for follow-up this summer with repeat lung study to assess this.  He saw the ENT doctor noticed excessive mucus on exam suspicious for reflux for his upper airway cough syndrome issue.  He saw spine specialist for his neck and upper back pain initially started low-dose Lyrica  and tramadol  while getting outside records and workup and has follow-up scheduled later this week.   02/28/22 Nathan [name] is a 54 y.o. male here for follow up for sarcoidosis with pulmonary and cutaneous involvement on methotrexate  15 mg p.o. weekly and folic acid  1 mg daily.  Since her last visit he had PET scan that looked good no evidence of cardiac sarcoidosis activity.  CT scan in October with stable lymphadenopathy and bandlike and nodular opacities in the lungs, although the associated nondiagnostic CT in December was consistent with hypermetabolic mediastinal and hilar lymphadenopathy and bilateral pulmonary parenchymal opacities consistent with inflammatory process and active granulomatous disease.  He is tolerating the methotrexate  without any difficulty.  Still been noticing some ongoing skin rash with minimally raised hyperpigmented spots along the medial border of the eyes.  He has not felt a significant improvement with his neck pain on the medication.  Still feels restriction in his range of motion especially looking towards the right and gets pain painful experiences towards the left side.   12/19/21 Nathan [name] is a 54 y.o. male here for follow up for joint pains with history of pulmonary and cutaneous sacoidosis now on methotrexate  15 mg PO weekly and folic acid  1 mg daily. He has not noticed any problems with the medication but also no improvement in symptoms so far. Still pain worst in the neck and upper back with radiation into back and with headaches. Asthma symptoms doing somewhat worse this year. He had cardiac MRI but is  now recommended for cardiac PET scan to evaluate but not scheduled yet.   Previous HPI 11/15/2021 Nathan [name] is a 54 y.o. male here for joint pains with history of pulmonary and cutaneous sacoidosis.  His worst affected area is pretty constant pain in the base of the neck area frequently with radiation up the neck and posterior headaches associated with this.  He has had problems going on for years but seems to be progressively getting somewhat worse.  He does get some pain in extremities elsewhere  but by far the most limiting for him is in the base of the neck.  He has tried multiple NSAIDs including meloxicam  and Celebrex is also tried muscle relaxant medications none of which were very significantly beneficial.  He is tried physical therapy completing courses and had local injections that did not greatly relieve symptoms.  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  He had most recent neck MRI in 2022 based on findings had discussed surgery options but is looking to avoid this due to concern about complications or long-term needing additional revision. Original diagnosis of sarcoidosis was based on pulmonary imaging finding and treated with very prolonged prednisone  taper.  Initially only had respiratory symptoms with some mild dyspnea and coughing that improved with the steroid treatments.  More recently this year he had an episode of similar symptoms coming back and was treated with a round of steroids from pulmonology clinic in noticed a increase in blood glucose up to 400s.  He is also developed some new skin rashes on the face previously had involvement on extremities but no facial rashes with original symptoms.  He is prescribed topical medication by Dr. Livingston but has not seen any difference so far.   No Rheumatology ROS completed.   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/2022   Pharyngoesophageal dysphagia 05/29/2022   Spinal stenosis, cervical region 11/25/2021   High risk medication use 11/15/2021   HLD (hyperlipidemia) 09/13/2021   Asthma 11/10/2020   Neuropathy 08/10/2020   MDD (major depressive disorder) 08/05/2020   GAD (generalized anxiety disorder) 08/05/2020   Panic disorder 08/05/2020   GERD (gastroesophageal reflux disease) 06/23/2020   Seasonal allergies 06/23/2020   Shoulder pain, left 10/23/2016   Cervical disc disease 06/14/2016   Upper airway cough syndrome 07/23/2014   Sarcoidosis of lung 03/06/2011    Past Medical History:  Diagnosis Date   Acid reflux disease    Arthritis    Asthma    'I think I might have asthma   Chronic back pain    Diabetes mellitus without complication (HCC)    Dyspnea    chronic   Hyperlipidemia    Hypertension    Sarcoidosis     Family History  Problem Relation Age of Onset   Hypertension Mother    Diabetes Mother    Heart attack Father    Diabetes Father    Hypertension Father    Gout Father    Colon polyps Sister    Heart disease Paternal Uncle    Prostate cancer Paternal Uncle    Heart disease Maternal Grandfather    Anemia Daughter    Anemia Daughter    Colon cancer Neg Hx    Esophageal cancer Neg Hx    Rectal cancer Neg Hx    Stomach cancer Neg Hx    Past Surgical History:  Procedure Laterality Date   ANTERIOR CERVICAL DECOMP/DISCECTOMY FUSION N/A 10/03/2023   Procedure: ANTERIOR CERVICAL DECOMPRESSION/DISCECTOMY FUSION 2 LEVELS;  Surgeon: Beuford Anes, MD;  Location: [facility] OR;  Service: Orthopedics;  Laterality: N/A;  ANTERIOR  CERVICAL DECOMPRESSION FUSION CERVICAL 4- CERVICAL 5, CERVICAL 5- CERVICAL 6 WITH INSTRUMENTATION AND ALLOGRAFT   BACK SURGERY     COLONOSCOPY     DENTAL SURGERY     NO PAST SURGERIES     Social History   Social History Narrative   Not on file   Immunization History  Administered Date(s) Administered   Dtap, Unspecified 11/09/1972, 01/07/1973, 03/01/1973, 03/24/1974, 12/06/1977   Hepatitis B, ADULT 08/27/2023   Influenza, Seasonal, Injecte, Preservative Fre 11/06/2022, 11/12/2023   Influenza,inj,Quad PF,6+ Mos 11/10/2020, 02/16/2022   Measles 09/23/1973   Moderna Covid-19 Fall Seasonal Vaccine 68yrs & older 11/06/2022   Mumps 03/24/1974   PFIZER Comirnaty(Gray Top)Covid-19 Tri-Sucrose Vaccine 10/28/2020   PFIZER(Purple Top)SARS-COV-2 Vaccination 10/27/2020   PNEUMOCOCCAL CONJUGATE-20 02/16/2022   Polio, Unspecified 11/09/1972, 01/07/1973, 03/11/1973, 03/24/1974, 12/06/1977   Rubella 09/23/1973   Tdap 02/16/2022     Objective: Vital Signs: There were no vitals taken for this visit.   Physical Exam   Musculoskeletal Exam: ***  CDAI Exam: CDAI Score: -- Patient Global: --; Provider Global: -- Swollen: --; Tender: -- Joint Exam 04/01/2024   No joint exam has been documented for this visit   There is currently no information documented on the homunculus. Go to the Rheumatology activity and complete the homunculus joint exam.  Investigation: No additional findings.  Imaging: No results found.  Recent Labs: Lab Results  Component Value Date   WBC 5.3 02/13/2024   HGB 12.9 (L) 02/13/2024   PLT 296 02/13/2024   NA 140 02/13/2024   K 4.0 02/13/2024   CL 103 02/13/2024   CO2 26 02/13/2024   GLUCOSE 155 (H) 02/13/2024   BUN 12 02/13/2024   CREATININE 0.66 02/13/2024   BILITOT 0.6 11/12/2023   ALKPHOS 62 04/28/2023   AST 17 11/12/2023   ALT 22 11/12/2023   PROT 7.0 11/12/2023   ALBUMIN 4.5 04/28/2023   CALCIUM  8.9 02/13/2024   GFRAA >60 09/17/2019     Speciality Comments: No specialty comments available.  Procedures:  No procedures performed Allergies: Atorvastatin , Rosuvastatin , Tizanidine , Zetia [ezetimibe], Oxycodone -acetaminophen , and Percocet [oxycodone -acetaminophen ]   Assessment / Plan:     Visit Diagnoses: No diagnosis found.  ***  Orders: No orders of the defined types were placed in this encounter.  No orders of the defined types were placed in this encounter.    Follow-Up Instructions: No follow-ups on file.   Caden Fatica M Penn Grissett, CMA  Note - This record has been created using Animal nutritionist.  Chart creation errors have been sought, but may not always  have been located. Such creation errors do not reflect on  the standard of medical care. "

## 2024-03-25 ENCOUNTER — Ambulatory Visit: Admitting: Physical Therapy

## 2024-03-27 ENCOUNTER — Ambulatory Visit: Admitting: Physical Therapy

## 2024-04-01 ENCOUNTER — Ambulatory Visit: Admitting: Internal Medicine

## 2024-04-01 DIAGNOSIS — D860 Sarcoidosis of lung: Secondary | ICD-10-CM

## 2024-04-01 DIAGNOSIS — Z79899 Other long term (current) drug therapy: Secondary | ICD-10-CM

## 2024-04-01 DIAGNOSIS — N529 Male erectile dysfunction, unspecified: Secondary | ICD-10-CM

## 2024-04-01 DIAGNOSIS — D863 Sarcoidosis of skin: Secondary | ICD-10-CM

## 2024-04-01 DIAGNOSIS — M4802 Spinal stenosis, cervical region: Secondary | ICD-10-CM

## 2024-04-09 ENCOUNTER — Ambulatory Visit: Admitting: Physician Assistant
